# Patient Record
Sex: Female | Born: 1960
Health system: Southern US, Community
[De-identification: ages and names within clinical notes are randomized; demographics above are authoritative.]

## PROBLEM LIST (undated history)

## (undated) DIAGNOSIS — E739 Lactose intolerance, unspecified: Secondary | ICD-10-CM

## (undated) DIAGNOSIS — I10 Essential (primary) hypertension: Secondary | ICD-10-CM

## (undated) DIAGNOSIS — M810 Age-related osteoporosis without current pathological fracture: Secondary | ICD-10-CM

## (undated) DIAGNOSIS — T8859XA Other complications of anesthesia, initial encounter: Secondary | ICD-10-CM

## (undated) DIAGNOSIS — E538 Deficiency of other specified B group vitamins: Secondary | ICD-10-CM

## (undated) DIAGNOSIS — M179 Osteoarthritis of knee, unspecified: Secondary | ICD-10-CM

## (undated) DIAGNOSIS — F32A Depression, unspecified: Secondary | ICD-10-CM

## (undated) DIAGNOSIS — M1990 Unspecified osteoarthritis, unspecified site: Secondary | ICD-10-CM

## (undated) DIAGNOSIS — E559 Vitamin D deficiency, unspecified: Secondary | ICD-10-CM

## (undated) DIAGNOSIS — T4145XA Adverse effect of unspecified anesthetic, initial encounter: Secondary | ICD-10-CM

## (undated) DIAGNOSIS — H269 Unspecified cataract: Secondary | ICD-10-CM

## (undated) DIAGNOSIS — M2550 Pain in unspecified joint: Secondary | ICD-10-CM

## (undated) DIAGNOSIS — H409 Unspecified glaucoma: Secondary | ICD-10-CM

## (undated) DIAGNOSIS — M549 Dorsalgia, unspecified: Secondary | ICD-10-CM

## (undated) DIAGNOSIS — R600 Localized edema: Secondary | ICD-10-CM

## (undated) DIAGNOSIS — E039 Hypothyroidism, unspecified: Secondary | ICD-10-CM

## (undated) DIAGNOSIS — M1710 Unilateral primary osteoarthritis, unspecified knee: Secondary | ICD-10-CM

## (undated) DIAGNOSIS — E119 Type 2 diabetes mellitus without complications: Secondary | ICD-10-CM

## (undated) DIAGNOSIS — F329 Major depressive disorder, single episode, unspecified: Secondary | ICD-10-CM

## (undated) DIAGNOSIS — F419 Anxiety disorder, unspecified: Secondary | ICD-10-CM

## (undated) HISTORY — DX: Deficiency of other specified B group vitamins: E53.8

## (undated) HISTORY — PX: OTHER SURGICAL HISTORY: SHX169

## (undated) HISTORY — DX: Pain in unspecified joint: M25.50

## (undated) HISTORY — DX: Unspecified cataract: H26.9

## (undated) HISTORY — PX: TUBAL LIGATION: SHX77

## (undated) HISTORY — DX: Essential (primary) hypertension: I10

## (undated) HISTORY — DX: Type 2 diabetes mellitus without complications: E11.9

## (undated) HISTORY — DX: Vitamin D deficiency, unspecified: E55.9

## (undated) HISTORY — DX: Unspecified osteoarthritis, unspecified site: M19.90

## (undated) HISTORY — DX: Unilateral primary osteoarthritis, unspecified knee: M17.10

## (undated) HISTORY — DX: Hypothyroidism, unspecified: E03.9

## (undated) HISTORY — DX: Osteoarthritis of knee, unspecified: M17.9

## (undated) HISTORY — DX: Unspecified glaucoma: H40.9

## (undated) HISTORY — DX: Lactose intolerance, unspecified: E73.9

## (undated) HISTORY — PX: ROTATOR CUFF REPAIR: SHX139

## (undated) HISTORY — DX: Localized edema: R60.0

## (undated) HISTORY — DX: Age-related osteoporosis without current pathological fracture: M81.0

## (undated) HISTORY — PX: DILATION AND CURETTAGE OF UTERUS: SHX78

---

## 1997-09-01 ENCOUNTER — Other Ambulatory Visit: Admission: RE | Admit: 1997-09-01 | Discharge: 1997-09-01 | Payer: Self-pay | Admitting: Family Medicine

## 1997-10-05 ENCOUNTER — Other Ambulatory Visit: Admission: RE | Admit: 1997-10-05 | Discharge: 1997-10-05 | Payer: Self-pay | Admitting: Family Medicine

## 1997-10-06 ENCOUNTER — Other Ambulatory Visit: Admission: RE | Admit: 1997-10-06 | Discharge: 1997-10-06 | Payer: Self-pay | Admitting: Family Medicine

## 1998-07-05 ENCOUNTER — Other Ambulatory Visit: Admission: RE | Admit: 1998-07-05 | Discharge: 1998-07-05 | Payer: Self-pay | Admitting: Obstetrics and Gynecology

## 1998-07-30 ENCOUNTER — Ambulatory Visit (HOSPITAL_COMMUNITY): Admission: RE | Admit: 1998-07-30 | Discharge: 1998-07-30 | Payer: Self-pay | Admitting: Obstetrics and Gynecology

## 1998-12-31 ENCOUNTER — Emergency Department (HOSPITAL_COMMUNITY): Admission: EM | Admit: 1998-12-31 | Discharge: 1998-12-31 | Payer: Self-pay

## 1999-01-11 ENCOUNTER — Encounter: Admission: RE | Admit: 1999-01-11 | Discharge: 1999-02-24 | Payer: Self-pay | Admitting: Sports Medicine

## 1999-10-15 ENCOUNTER — Ambulatory Visit (HOSPITAL_COMMUNITY): Admission: RE | Admit: 1999-10-15 | Discharge: 1999-10-15 | Payer: Self-pay | Admitting: Sports Medicine

## 1999-10-15 ENCOUNTER — Encounter: Payer: Self-pay | Admitting: Sports Medicine

## 2000-07-10 ENCOUNTER — Encounter: Admission: RE | Admit: 2000-07-10 | Discharge: 2000-07-10 | Payer: Self-pay | Admitting: Family Medicine

## 2000-07-10 ENCOUNTER — Encounter: Payer: Self-pay | Admitting: Family Medicine

## 2001-12-16 ENCOUNTER — Other Ambulatory Visit: Admission: RE | Admit: 2001-12-16 | Discharge: 2001-12-16 | Payer: Self-pay | Admitting: Family Medicine

## 2002-12-19 ENCOUNTER — Other Ambulatory Visit: Admission: RE | Admit: 2002-12-19 | Discharge: 2002-12-19 | Payer: Self-pay | Admitting: Family Medicine

## 2003-10-19 ENCOUNTER — Other Ambulatory Visit: Admission: RE | Admit: 2003-10-19 | Discharge: 2003-10-19 | Payer: Self-pay | Admitting: Family Medicine

## 2004-05-03 ENCOUNTER — Encounter: Admission: RE | Admit: 2004-05-03 | Discharge: 2004-05-03 | Payer: Self-pay | Admitting: Family Medicine

## 2004-05-10 ENCOUNTER — Ambulatory Visit: Payer: Self-pay | Admitting: Family Medicine

## 2004-06-22 ENCOUNTER — Emergency Department (HOSPITAL_COMMUNITY): Admission: EM | Admit: 2004-06-22 | Discharge: 2004-06-22 | Payer: Self-pay | Admitting: Emergency Medicine

## 2004-06-23 ENCOUNTER — Ambulatory Visit: Payer: Self-pay | Admitting: Family Medicine

## 2004-06-29 ENCOUNTER — Encounter: Admission: RE | Admit: 2004-06-29 | Discharge: 2004-06-29 | Payer: Self-pay | Admitting: Family Medicine

## 2004-10-04 ENCOUNTER — Encounter: Payer: Self-pay | Admitting: Internal Medicine

## 2004-10-04 ENCOUNTER — Encounter (INDEPENDENT_AMBULATORY_CARE_PROVIDER_SITE_OTHER): Payer: Self-pay | Admitting: *Deleted

## 2004-10-05 ENCOUNTER — Ambulatory Visit: Payer: Self-pay | Admitting: Internal Medicine

## 2004-10-05 ENCOUNTER — Other Ambulatory Visit: Admission: RE | Admit: 2004-10-05 | Discharge: 2004-10-05 | Payer: Self-pay | Admitting: Internal Medicine

## 2004-10-11 ENCOUNTER — Encounter: Payer: Self-pay | Admitting: Internal Medicine

## 2005-01-10 ENCOUNTER — Ambulatory Visit: Payer: Self-pay | Admitting: Internal Medicine

## 2005-03-13 ENCOUNTER — Encounter: Payer: Self-pay | Admitting: Internal Medicine

## 2005-03-13 ENCOUNTER — Encounter: Admission: RE | Admit: 2005-03-13 | Discharge: 2005-03-13 | Payer: Self-pay | Admitting: Internal Medicine

## 2005-06-06 ENCOUNTER — Ambulatory Visit: Payer: Self-pay | Admitting: Internal Medicine

## 2005-09-05 ENCOUNTER — Ambulatory Visit: Payer: Self-pay | Admitting: Internal Medicine

## 2005-10-11 ENCOUNTER — Encounter: Payer: Self-pay | Admitting: Internal Medicine

## 2005-10-11 ENCOUNTER — Ambulatory Visit: Payer: Self-pay | Admitting: Internal Medicine

## 2005-10-12 ENCOUNTER — Other Ambulatory Visit: Admission: RE | Admit: 2005-10-12 | Discharge: 2005-10-12 | Payer: Self-pay | Admitting: Internal Medicine

## 2005-12-20 ENCOUNTER — Ambulatory Visit: Payer: Self-pay | Admitting: Internal Medicine

## 2006-01-11 ENCOUNTER — Ambulatory Visit: Payer: Self-pay | Admitting: Internal Medicine

## 2006-01-24 ENCOUNTER — Ambulatory Visit: Payer: Self-pay | Admitting: Internal Medicine

## 2006-04-26 ENCOUNTER — Ambulatory Visit: Payer: Self-pay | Admitting: Internal Medicine

## 2006-04-26 LAB — CONVERTED CEMR LAB
CO2: 29 meq/L (ref 19–32)
Calcium: 9.4 mg/dL (ref 8.4–10.5)
Chloride: 102 meq/L (ref 96–112)
Creatinine, Ser: 0.9 mg/dL (ref 0.4–1.2)
GFR calc non Af Amer: 72 mL/min
Hemoglobin: 12.8 g/dL (ref 12.0–15.0)
MCHC: 34.4 g/dL (ref 30.0–36.0)
Platelets: 302 10*3/uL (ref 150–400)
Potassium: 3.2 meq/L — ABNORMAL LOW (ref 3.5–5.1)
RBC: 4.53 M/uL (ref 3.87–5.11)
Sodium: 137 meq/L (ref 135–145)
Total Bilirubin: 0.6 mg/dL (ref 0.3–1.2)
Total Protein: 6.7 g/dL (ref 6.0–8.3)

## 2006-06-23 DIAGNOSIS — E039 Hypothyroidism, unspecified: Secondary | ICD-10-CM

## 2006-06-23 DIAGNOSIS — M1710 Unilateral primary osteoarthritis, unspecified knee: Secondary | ICD-10-CM

## 2006-06-23 DIAGNOSIS — J302 Other seasonal allergic rhinitis: Secondary | ICD-10-CM

## 2006-07-31 ENCOUNTER — Encounter: Payer: Self-pay | Admitting: Internal Medicine

## 2006-07-31 ENCOUNTER — Ambulatory Visit: Payer: Self-pay | Admitting: Internal Medicine

## 2006-07-31 DIAGNOSIS — I10 Essential (primary) hypertension: Secondary | ICD-10-CM

## 2006-10-24 ENCOUNTER — Emergency Department (HOSPITAL_COMMUNITY): Admission: EM | Admit: 2006-10-24 | Discharge: 2006-10-25 | Payer: Self-pay | Admitting: Emergency Medicine

## 2006-10-25 ENCOUNTER — Telehealth (INDEPENDENT_AMBULATORY_CARE_PROVIDER_SITE_OTHER): Payer: Self-pay | Admitting: *Deleted

## 2006-12-27 ENCOUNTER — Other Ambulatory Visit: Admission: RE | Admit: 2006-12-27 | Discharge: 2006-12-27 | Payer: Self-pay | Admitting: Internal Medicine

## 2006-12-27 ENCOUNTER — Encounter: Payer: Self-pay | Admitting: Internal Medicine

## 2006-12-27 ENCOUNTER — Ambulatory Visit: Payer: Self-pay | Admitting: Internal Medicine

## 2006-12-28 LAB — CONVERTED CEMR LAB
ALT: 21 units/L (ref 0–35)
AST: 26 units/L (ref 0–37)
BUN: 10 mg/dL (ref 6–23)
Creatinine, Ser: 0.8 mg/dL (ref 0.4–1.2)
GFR calc non Af Amer: 83 mL/min
HCT: 36.9 % (ref 36.0–46.0)
HDL: 35.5 mg/dL — ABNORMAL LOW (ref >39.0)
Lymphocytes Relative: 43.6 % (ref 12.0–46.0)
MCV: 82.7 fL (ref 78.0–100.0)
Monocytes Absolute: 0.3 10*3/uL (ref 0.2–0.7)
Neutro Abs: 1.9 10*3/uL (ref 1.4–7.7)
Neutrophils Relative %: 46.7 % (ref 43.0–77.0)
Potassium: 4 meq/L (ref 3.5–5.1)
RBC: 4.47 M/uL (ref 3.87–5.11)
RDW: 12.9 % (ref 11.5–14.6)
Sodium: 137 meq/L (ref 135–145)
TSH: 5.56 microintl units/mL — ABNORMAL HIGH (ref 0.35–5.50)
Total CHOL/HDL Ratio: 4
WBC: 4.1 10*3/uL — ABNORMAL LOW (ref 4.5–10.5)

## 2007-01-07 ENCOUNTER — Encounter (INDEPENDENT_AMBULATORY_CARE_PROVIDER_SITE_OTHER): Payer: Self-pay | Admitting: *Deleted

## 2007-01-30 ENCOUNTER — Telehealth (INDEPENDENT_AMBULATORY_CARE_PROVIDER_SITE_OTHER): Payer: Self-pay | Admitting: *Deleted

## 2007-02-14 ENCOUNTER — Telehealth (INDEPENDENT_AMBULATORY_CARE_PROVIDER_SITE_OTHER): Payer: Self-pay | Admitting: *Deleted

## 2007-03-12 ENCOUNTER — Ambulatory Visit: Payer: Self-pay | Admitting: Internal Medicine

## 2007-04-30 ENCOUNTER — Telehealth: Payer: Self-pay | Admitting: Internal Medicine

## 2007-05-27 ENCOUNTER — Encounter (INDEPENDENT_AMBULATORY_CARE_PROVIDER_SITE_OTHER): Payer: Self-pay | Admitting: *Deleted

## 2007-05-28 ENCOUNTER — Telehealth (INDEPENDENT_AMBULATORY_CARE_PROVIDER_SITE_OTHER): Payer: Self-pay | Admitting: *Deleted

## 2007-05-30 ENCOUNTER — Ambulatory Visit: Payer: Self-pay | Admitting: Internal Medicine

## 2007-06-03 ENCOUNTER — Encounter (INDEPENDENT_AMBULATORY_CARE_PROVIDER_SITE_OTHER): Payer: Self-pay | Admitting: *Deleted

## 2007-06-04 ENCOUNTER — Telehealth (INDEPENDENT_AMBULATORY_CARE_PROVIDER_SITE_OTHER): Payer: Self-pay | Admitting: *Deleted

## 2007-09-19 ENCOUNTER — Telehealth (INDEPENDENT_AMBULATORY_CARE_PROVIDER_SITE_OTHER): Payer: Self-pay | Admitting: *Deleted

## 2008-02-05 ENCOUNTER — Telehealth (INDEPENDENT_AMBULATORY_CARE_PROVIDER_SITE_OTHER): Payer: Self-pay | Admitting: *Deleted

## 2008-02-06 ENCOUNTER — Ambulatory Visit: Payer: Self-pay | Admitting: Internal Medicine

## 2008-02-11 ENCOUNTER — Encounter: Payer: Self-pay | Admitting: Internal Medicine

## 2008-02-11 ENCOUNTER — Ambulatory Visit: Payer: Self-pay | Admitting: Internal Medicine

## 2008-02-11 ENCOUNTER — Other Ambulatory Visit: Admission: RE | Admit: 2008-02-11 | Discharge: 2008-02-11 | Payer: Self-pay | Admitting: Internal Medicine

## 2008-02-17 ENCOUNTER — Encounter (INDEPENDENT_AMBULATORY_CARE_PROVIDER_SITE_OTHER): Payer: Self-pay | Admitting: *Deleted

## 2008-03-03 ENCOUNTER — Ambulatory Visit: Payer: Self-pay | Admitting: Internal Medicine

## 2008-03-03 ENCOUNTER — Encounter: Admission: RE | Admit: 2008-03-03 | Discharge: 2008-03-03 | Payer: Self-pay | Admitting: Internal Medicine

## 2008-03-06 ENCOUNTER — Encounter (INDEPENDENT_AMBULATORY_CARE_PROVIDER_SITE_OTHER): Payer: Self-pay | Admitting: *Deleted

## 2008-03-06 LAB — CONVERTED CEMR LAB
BUN: 14 mg/dL (ref 6–23)
CO2: 30 meq/L (ref 19–32)
Chloride: 102 meq/L (ref 96–112)
Creatinine, Ser: 0.8 mg/dL (ref 0.4–1.2)
Eosinophils Absolute: 0.2 10*3/uL (ref 0.0–0.7)
Eosinophils Relative: 3.8 % (ref 0.0–5.0)
GFR calc Af Amer: 99 mL/min
GFR calc non Af Amer: 82 mL/min
Glucose, Bld: 92 mg/dL (ref 70–99)
HCT: 38.2 % (ref 36.0–46.0)
HDL: 37.1 mg/dL — ABNORMAL LOW (ref >39.0)
Hemoglobin: 13.3 g/dL (ref 12.0–15.0)
MCHC: 34.8 g/dL (ref 30.0–36.0)
MCV: 82.3 fL (ref 78.0–100.0)
Neutrophils Relative %: 44.8 % (ref 43.0–77.0)
Platelets: 233 10*3/uL (ref 150–400)
Potassium: 4 meq/L (ref 3.5–5.1)
RDW: 13.4 % (ref 11.5–14.6)
Sodium: 137 meq/L (ref 135–145)
Triglycerides: 57 mg/dL (ref 0–149)

## 2008-04-28 ENCOUNTER — Telehealth (INDEPENDENT_AMBULATORY_CARE_PROVIDER_SITE_OTHER): Payer: Self-pay | Admitting: *Deleted

## 2008-09-11 ENCOUNTER — Ambulatory Visit: Payer: Self-pay | Admitting: Internal Medicine

## 2008-09-24 ENCOUNTER — Telehealth (INDEPENDENT_AMBULATORY_CARE_PROVIDER_SITE_OTHER): Payer: Self-pay | Admitting: *Deleted

## 2009-01-12 ENCOUNTER — Ambulatory Visit: Payer: Self-pay | Admitting: Internal Medicine

## 2009-01-12 ENCOUNTER — Other Ambulatory Visit: Admission: RE | Admit: 2009-01-12 | Discharge: 2009-01-12 | Payer: Self-pay | Admitting: Internal Medicine

## 2009-01-12 ENCOUNTER — Encounter: Payer: Self-pay | Admitting: Internal Medicine

## 2009-01-12 DIAGNOSIS — N926 Irregular menstruation, unspecified: Secondary | ICD-10-CM | POA: Insufficient documentation

## 2009-01-12 DIAGNOSIS — N939 Abnormal uterine and vaginal bleeding, unspecified: Secondary | ICD-10-CM

## 2009-01-12 LAB — CONVERTED CEMR LAB
Nitrite: NEGATIVE
Protein, U semiquant: NEGATIVE
Specific Gravity, Urine: 1.01
WBC Urine, dipstick: NEGATIVE

## 2009-01-14 ENCOUNTER — Encounter (INDEPENDENT_AMBULATORY_CARE_PROVIDER_SITE_OTHER): Payer: Self-pay | Admitting: *Deleted

## 2009-01-18 ENCOUNTER — Telehealth: Payer: Self-pay | Admitting: Internal Medicine

## 2009-01-18 LAB — CONVERTED CEMR LAB
BUN: 11 mg/dL (ref 6–23)
Basophils Absolute: 0 10*3/uL (ref 0.0–0.1)
Basophils Relative: 0.9 % (ref 0.0–3.0)
CO2: 27 meq/L (ref 19–32)
Calcium: 9.3 mg/dL (ref 8.4–10.5)
Chloride: 105 meq/L (ref 96–112)
Creatinine, Ser: 0.6 mg/dL (ref 0.4–1.2)
Eosinophils Absolute: 0.1 10*3/uL (ref 0.0–0.7)
Eosinophils Relative: 2.6 % (ref 0.0–5.0)
Hemoglobin: 13.3 g/dL (ref 12.0–15.0)
Lymphocytes Relative: 41.4 % (ref 12.0–46.0)
Lymphs Abs: 2.1 10*3/uL (ref 0.7–4.0)
Monocytes Relative: 13.6 % — ABNORMAL HIGH (ref 3.0–12.0)
Neutro Abs: 2.1 10*3/uL (ref 1.4–7.7)
RBC: 4.75 M/uL (ref 3.87–5.11)
RDW: 12.6 % (ref 11.5–14.6)
WBC: 5 10*3/uL (ref 4.5–10.5)

## 2009-01-21 ENCOUNTER — Ambulatory Visit: Payer: Self-pay | Admitting: Internal Medicine

## 2009-03-31 ENCOUNTER — Other Ambulatory Visit: Admission: RE | Admit: 2009-03-31 | Discharge: 2009-03-31 | Payer: Self-pay | Admitting: Obstetrics & Gynecology

## 2009-03-31 ENCOUNTER — Ambulatory Visit: Payer: Self-pay | Admitting: Obstetrics & Gynecology

## 2009-03-31 LAB — CONVERTED CEMR LAB

## 2009-04-06 ENCOUNTER — Telehealth (INDEPENDENT_AMBULATORY_CARE_PROVIDER_SITE_OTHER): Payer: Self-pay | Admitting: *Deleted

## 2009-04-16 ENCOUNTER — Encounter (INDEPENDENT_AMBULATORY_CARE_PROVIDER_SITE_OTHER): Payer: Self-pay | Admitting: *Deleted

## 2009-04-16 ENCOUNTER — Telehealth: Payer: Self-pay | Admitting: Internal Medicine

## 2009-05-04 ENCOUNTER — Telehealth (INDEPENDENT_AMBULATORY_CARE_PROVIDER_SITE_OTHER): Payer: Self-pay | Admitting: *Deleted

## 2009-05-05 ENCOUNTER — Ambulatory Visit: Payer: Self-pay | Admitting: Internal Medicine

## 2009-05-07 ENCOUNTER — Telehealth (INDEPENDENT_AMBULATORY_CARE_PROVIDER_SITE_OTHER): Payer: Self-pay | Admitting: *Deleted

## 2009-05-10 ENCOUNTER — Encounter (INDEPENDENT_AMBULATORY_CARE_PROVIDER_SITE_OTHER): Payer: Self-pay | Admitting: *Deleted

## 2009-05-10 LAB — CONVERTED CEMR LAB: TSH: 1.99 microintl units/mL (ref 0.35–5.50)

## 2009-05-19 ENCOUNTER — Encounter: Payer: Self-pay | Admitting: Internal Medicine

## 2009-05-19 ENCOUNTER — Ambulatory Visit (HOSPITAL_COMMUNITY): Admission: RE | Admit: 2009-05-19 | Discharge: 2009-05-19 | Payer: Self-pay | Admitting: Obstetrics & Gynecology

## 2009-10-26 ENCOUNTER — Ambulatory Visit: Payer: Self-pay | Admitting: Internal Medicine

## 2009-11-02 ENCOUNTER — Telehealth: Payer: Self-pay | Admitting: Internal Medicine

## 2009-11-02 ENCOUNTER — Telehealth (INDEPENDENT_AMBULATORY_CARE_PROVIDER_SITE_OTHER): Payer: Self-pay | Admitting: *Deleted

## 2009-11-20 ENCOUNTER — Ambulatory Visit: Payer: Self-pay | Admitting: Family Medicine

## 2009-12-21 ENCOUNTER — Ambulatory Visit: Payer: Self-pay | Admitting: Internal Medicine

## 2009-12-22 LAB — CONVERTED CEMR LAB
ALT: 34 units/L (ref 0–35)
Albumin: 4.1 g/dL (ref 3.5–5.2)
Bilirubin, Direct: 0.1 mg/dL (ref 0.0–0.3)
Total Bilirubin: 0.6 mg/dL (ref 0.3–1.2)
Total Protein: 7.3 g/dL (ref 6.0–8.3)

## 2009-12-23 ENCOUNTER — Encounter (INDEPENDENT_AMBULATORY_CARE_PROVIDER_SITE_OTHER): Payer: Self-pay | Admitting: *Deleted

## 2010-01-18 ENCOUNTER — Ambulatory Visit: Payer: Self-pay | Admitting: Internal Medicine

## 2010-01-18 ENCOUNTER — Encounter (INDEPENDENT_AMBULATORY_CARE_PROVIDER_SITE_OTHER): Payer: Self-pay | Admitting: *Deleted

## 2010-01-18 ENCOUNTER — Other Ambulatory Visit: Admission: RE | Admit: 2010-01-18 | Discharge: 2010-01-18 | Payer: Self-pay | Admitting: Internal Medicine

## 2010-01-18 LAB — HM PAP SMEAR

## 2010-01-19 ENCOUNTER — Ambulatory Visit: Payer: Self-pay | Admitting: Gastroenterology

## 2010-02-24 ENCOUNTER — Encounter (INDEPENDENT_AMBULATORY_CARE_PROVIDER_SITE_OTHER): Payer: Self-pay | Admitting: *Deleted

## 2010-02-24 ENCOUNTER — Ambulatory Visit: Payer: Self-pay | Admitting: Gastroenterology

## 2010-02-24 ENCOUNTER — Telehealth (INDEPENDENT_AMBULATORY_CARE_PROVIDER_SITE_OTHER): Payer: Self-pay | Admitting: *Deleted

## 2010-03-02 ENCOUNTER — Ambulatory Visit: Payer: Self-pay | Admitting: Gastroenterology

## 2010-03-03 ENCOUNTER — Telehealth: Payer: Self-pay | Admitting: Internal Medicine

## 2010-05-01 DIAGNOSIS — M1990 Unspecified osteoarthritis, unspecified site: Secondary | ICD-10-CM

## 2010-05-01 HISTORY — DX: Unspecified osteoarthritis, unspecified site: M19.90

## 2010-05-20 ENCOUNTER — Ambulatory Visit (HOSPITAL_COMMUNITY): Admission: RE | Admit: 2010-05-20 | Payer: Self-pay | Source: Home / Self Care | Admitting: Internal Medicine

## 2010-05-22 ENCOUNTER — Encounter: Payer: Self-pay | Admitting: Internal Medicine

## 2010-06-01 ENCOUNTER — Encounter: Payer: Self-pay | Admitting: Internal Medicine

## 2010-06-02 NOTE — Miscellaneous (Signed)
Summary: colonoscopy/ks  Clinical Lists Changes  Observations: Added new observation of ALLERGY REV: Done (02/24/2010 16:21)

## 2010-06-02 NOTE — Progress Notes (Signed)
Summary: THYROID TEST RESULTS BACK??-- PT OUT OF MEDS  Phone Note Call from Patient Call back at [facility] Phone [phone]   Caller: Patient Summary of Call: PATEINT CALLED TO SEE IF HER THYROID TEST RESULTS ARE BACK----PLEASE READ AND SEND PRESCRIPTION TO [facility] ON [address]  SHE HAS BEEN OUT FOR THREE DAYS---PLEASE CALL HER TO VERIFY THAT PRESCRIPTION HAS BEEN CALLED IN Initial call taken by: Jerolyn Shin,  May 07, 2009 1:03 PM  Follow-up for Phone Call        rx faxed to [facility] pt has been informed .Kandice Hams  May 07, 2009 1:55 PM  Follow-up by: Kandice Hams,  May 07, 2009 1:55 PM    Prescriptions: SYNTHROID 175 MCG TABS (LEVOTHYROXINE SODIUM) 1 daily  #35 Each x 5   Entered by:   Kandice Hams   Authorized by:   Nolon Rod. Paz MD   Signed by:   Kandice Hams on 05/07/2009   Method used:   Faxed to ...       [facility]       [address]       [place], [place]  [address]       Ph: [phone]       Fax: [phone]   RxID:   [id number]

## 2010-06-02 NOTE — Progress Notes (Signed)
Summary: refill  Phone Note Refill Request Call back at Home Phone [phone]   Refills Requested: Medication #1:  MAXZIDE-25 37.5-25 MG TABS 1/2 tab by mouth once  a day patient grandson spill pills out - needs new rx [facility]   Initial call taken by: Okey Regal Spring,  November 02, 2009 1:51 PM    Prescriptions: MAXZIDE-25 37.5-25 MG TABS (TRIAMTERENE-HCTZ) 1/2 tab by mouth once  a day  #30 Each x 2   Entered by:   Army Fossa CMA   Authorized by:   Nolon Rod. Paz MD   Signed by:   Army Fossa CMA on 11/02/2009   Method used:   Electronically to        [facility] (retail)       [address]       [place], [place]  [address]       Ph: [phone]       Fax: [phone]   RxID:   [id number]

## 2010-06-02 NOTE — Procedures (Signed)
Summary: Colonoscopy  Patient: Kathleen Mcfarland Note: All result statuses are Final unless otherwise noted.  Tests: (1) Colonoscopy (COL)   COL Colonoscopy           DONE     [facility]     [address]     [place], [place]  [address]           COLONOSCOPY PROCEDURE REPORT           PATIENT:  Kathleen Mcfarland, Kathleen Mcfarland  MR#:  [id number]     BIRTHDATE:  1960/08/01, 49 yrs. old  GENDER:  female     ENDOSCOPIST:  Rachael Fee, MD     REF. BY:  Willow Ora, M.D.     PROCEDURE DATE:  03/02/2010     PROCEDURE:  Diagnostic Colonoscopy     ASA CLASS:  Class II     INDICATIONS:  Elevated Risk Screening, two second degree relatives     with colon cancer     MEDICATIONS:   Fentanyl 75 mcg IV, Versed 8 mg IV           DESCRIPTION OF PROCEDURE:   After the risks benefits and     alternatives of the procedure were thoroughly explained, informed     consent was obtained.  Digital rectal exam was performed and     revealed no rectal masses.   The LB [id number]-[id number] endoscope     was introduced through the anus and advanced to the cecum, which     was identified by both the appendix and ileocecal valve, without     limitations.  The quality of the prep was good, using MoviPrep.     The instrument was then slowly withdrawn as the colon was fully     examined.     <<PROCEDUREIMAGES>>     FINDINGS:  Mild diverticulosis was found in the sigmoid to     descending colon segments (see image1).  This was otherwise a     normal examination of the colon (see image2, image4, and image5).     Retroflexed views in the rectum revealed no abnormalities.    The     scope was then withdrawn from the patient and the procedure     completed.     COMPLICATIONS:  None           ENDOSCOPIC IMPRESSION:     1) Mild diverticulosis in the sigmoid to descending colon     segments     2) Otherwise normal examination; no polyps or cancers           RECOMMENDATIONS:     1) Given your family history of colon  cancer, you should have a     repeat colonoscopy in 5 years           REPEAT EXAM:  5 years           ______________________________     Rachael Fee, MD           n.     eSIGNED:   Rachael Fee at 03/02/2010 10:44 AM           [employer], [id number]  Note: An exclamation mark (!) indicates a result that was not dispersed into the flowsheet. Document Creation Date: 03/02/2010 10:45 AM _______________________________________________________________________  (1) Order result status: Final Collection or observation date-time: 03/02/2010 10:41 Requested date-time:  Receipt date-time:  Reported date-time:  Referring Physician:   Ordering Physician: Rob Bunting (  [id number]) Specimen Source:  Source: Launa Grill Order Number: [id number] Lab site:   Appended Document: Colonoscopy    Clinical Lists Changes  Observations: Added new observation of COLONNXTDUE: 03/2015 (03/02/2010 11:53)

## 2010-06-02 NOTE — Letter (Signed)
Summary: Results Follow up Letter  [facility] [place]  [address] [place], [place] [address]   Phone: [phone]  Fax: [phone]    05/10/2009 MRN: [id number]  [facility] [address] [place], [place]  [address]  Dear Ms. Kaestner,   Your recent thyroid test was normal.  Please continue on your current dose of synthroid.  Please call our office to schedule a lab only visit in 3 months to recheck your thyroid.  Feel free to call me if you have any questions or concerns.   Alena Bills [phone] ext [phone]

## 2010-06-02 NOTE — Progress Notes (Signed)
Summary: need lab appt  Phone Note Outgoing Call   Call placed by: Doristine Devoid,  May 04, 2009 9:01 AM Call placed to: Patient Summary of Call: left message on machine informing patient needs to schedule appt   -due for recheck on TSH from 01/12/09 labs-244.9  need to check labs before refilling meds   Follow-up for Phone Call        spoke with pt -- scheduled lab visit for tomorrow Kathleen Mcfarland  May 04, 2009 2:16 PM

## 2010-06-02 NOTE — Progress Notes (Signed)
Summary: med not available  Phone Note Call from Patient Call back at [phone]   Caller: Patient Summary of Call: [facility] - patient said med not available from [facility] - does dr Drue Novel want to change med - [facility] [address] Initial call taken by: Okey Regal Spring,  November 02, 2009 4:30 PM  Follow-up for Phone Call        change to hydrochlorothiazide 25 mg one p.o. q. day please arrange for office visit in one month Follow-up by: Zaylin Pistilli E. Dealie Koelzer MD,  November 03, 2009 2:53 PM  Additional Follow-up for Phone Call Additional follow up Details #1::        Spoke with pt, called in meds and she has an appt for 1 month. Army Fossa CMA  November 03, 2009 2:58 PM     New/Updated Medications: HYDROCHLOROTHIAZIDE 25 MG TABS (HYDROCHLOROTHIAZIDE) 1 by mouth once daily. Prescriptions: HYDROCHLOROTHIAZIDE 25 MG TABS (HYDROCHLOROTHIAZIDE) 1 by mouth once daily.  #30 x 0   Entered by:   Army Fossa CMA   Authorized by:   Nolon Rod. Danity Schmelzer MD   Signed by:   Army Fossa CMA on 11/03/2009   Method used:   Electronically to        [facility] (retail)       [address]       [place], [place]  [address]       Ph: [phone]       Fax: [phone]   RxID:   [id number]

## 2010-06-02 NOTE — Assessment & Plan Note (Signed)
Summary: discuss colonoscopy with family hx//lch   Vital Signs:  Patient profile:   50 year old female Menstrual status:  irregular Weight:      238 pounds Pulse rate:   85 / minute Pulse rhythm:   regular BP sitting:   126 / 80  (left arm) Cuff size:   large  Vitals Entered By: Army Fossa CMA (December 21, 2009 11:27 AM) CC: Referral for colonoscopy due to family history CBG Result 99 Comments - TSH level checked BS tested for DM   History of Present Illness: several issues She likes a referral for colonoscopy, her FH  is updated. See below Also like a screening for diabetes and liver problems . she just lost her mom to liver failure (per pt)  Current Medications (verified): 1)  Synthroid 175 Mcg Tabs (Levothyroxine Sodium) .Marland Kitchen.. 1 Daily 2)  Hydrochlorothiazide 25 Mg Tabs (Hydrochlorothiazide) .Marland Kitchen.. 1 By Mouth Once Daily. 3)  Healthy Shakes  Allergies: 1)  ! * Felodipine  Past History:  Past Medical History: Reviewed history from 01/12/2009 and no changes required. Allergic rhinitis Hypothyroidism HYPERTENSION OSTEOARTHRITIS, KNEE   G2P2  Past Surgical History: Reviewed history from 01/12/2009 and no changes required. Tubal ligation Rotator cuff repair (rt shoulder)  Family History: breast cancer--no CAD--father has heart disease diagnosed at age 40  colon cancer-- great uncle dx at age 13 great aunt dx at age 66  GM age of dx in her 50  colon polyps: 2 sisters dx in her 1 and 35 y/o  DM  (+) , F M HTN (+) F M stroke - no hepatitis C--M   Social History: Reviewed history from 01/12/2009 and no changes required. Single two children Occupation: Curator Former Smoker (was not a "heavy smoker") Alcohol use-no Drug use-no Regular exercise-yes (2x/wk) caffeine - no  Review of Systems GI:  Denies bloody stools, diarrhea, nausea, and vomiting.  Physical Exam  General:  alert, well-developed, and overweight-appearing.   Abdomen:  soft,  non-tender, no distention, no masses, no guarding, and no rigidity.     Impression & Recommendations:  Problem # 1:  HYPOTHYROIDISM (ICD-244.9) due for labs Her updated medication list for this problem includes:    Synthroid 175 Mcg Tabs (Levothyroxine sodium) .Marland Kitchen... 1 daily  Orders: Venipuncture ([address]) TLB-TSH (Thyroid Stimulating Hormone) (84443-TSH)  Labs Reviewed: TSH: 1.99 (05/05/2009)    Chol: 146 (03/03/2008)   HDL: 37.1 (03/03/2008)   LDL: 98 (03/03/2008)   TG: 57 (03/03/2008)  Problem # 2:  HYPERTENSION, BENIGN ESSENTIAL (ICD-401.1) at goal  Her updated medication list for this problem includes:    Hydrochlorothiazide 25 Mg Tabs (Hydrochlorothiazide) .Marland Kitchen... 1 by mouth once daily.  Orders: TLB-Hepatic/Liver Function Pnl (80076-HEPATIC)  BP today: 126/80 Prior BP: 128/80 (11/20/2009)  Labs Reviewed: K+: 4.2 (01/12/2009) Creat: : 0.6 (01/12/2009)   Chol: 146 (03/03/2008)   HDL: 37.1 (03/03/2008)   LDL: 98 (03/03/2008)   TG: 57 (03/03/2008)  Problem # 3:  HEALTH MAINTENANCE EXAM (ICD-V70.0)  family history updated, will refer to GI for colonoscopy screening for diabetes negative  Orders: Gastroenterology Referral (GI)  Complete Medication List: 1)  Synthroid 175 Mcg Tabs (Levothyroxine sodium) .Marland Kitchen.. 1 daily 2)  Hydrochlorothiazide 25 Mg Tabs (Hydrochlorothiazide) .Marland Kitchen.. 1 by mouth once daily. 3)  Healthy Shakes   Patient Instructions: 1)  Please schedule a follow-up appointment in 1 or 2 month, fasting, physical exam

## 2010-06-02 NOTE — Progress Notes (Signed)
Summary: prep ?'s  Phone Note Call from Patient Call back at [facility] Phone [phone]   Caller: Patient Call For: Dr. Christella Hartigan Reason for Call: Talk to Nurse Summary of Call: prep ?'s Initial call taken by: Vallarie Mare,  February 24, 2010 11:25 AM  Follow-up for Phone Call        pt rescheduled colonoscopy from 01/19/2010 to 03/02/2010.  Lost prep instruction from 9/21.  PV scheduled for today at 4:30. Follow-up by: Ezra Sites RN,  February 24, 2010 11:37 AM

## 2010-06-02 NOTE — Letter (Signed)
Summary: [facility] Instructions  [facility]  [address] [place], [place] [address]   Phone: [phone]  Fax: [phone]       Kathleen Mcfarland    11/29/1960    MRN: [id number]        Procedure Day Dorna Bloom:  Wednesday 03/02/2010     Arrival Time: 9:30 am      Procedure Time: 10:30 am     Location of Procedure:                    _x _  [facility] (4th Floor)                        PREPARATION FOR COLONOSCOPY WITH MOVIPREP   Starting 5 days prior to your procedure Friday 10/28 do not eat nuts, seeds, popcorn, corn, beans, peas,  salads, or any raw vegetables.  Do not take any fiber supplements (e.g. Metamucil, Citrucel, and Benefiber).  THE DAY BEFORE YOUR PROCEDURE         DATE: Tuesday 11/1  1.  Drink clear liquids the entire day-NO SOLID FOOD  2.  Do not drink anything colored red or purple.  Avoid juices with pulp.  No orange juice.  3.  Drink at least 64 oz. (8 glasses) of fluid/clear liquids during the day to prevent dehydration and help the prep work efficiently.  CLEAR LIQUIDS INCLUDE: Water Jello Ice Popsicles Tea (sugar ok, no milk/cream) Powdered fruit flavored drinks Coffee (sugar ok, no milk/cream) Gatorade Juice: apple, white grape, white cranberry  Lemonade Clear bullion, consomm, broth Carbonated beverages (any kind) Strained chicken noodle soup Hard Candy                             4.  In the morning, mix first dose of MoviPrep solution:    Empty 1 Pouch A and 1 Pouch B into the disposable container    Add lukewarm drinking water to the top line of the container. Mix to dissolve    Refrigerate (mixed solution should be used within 24 hrs)  5.  Begin drinking the prep at 5:00 p.m. The MoviPrep container is divided by 4 marks.   Every 15 minutes drink the solution down to the next mark (approximately 8 oz) until the full liter is complete.   6.  Follow completed prep with 16 oz of clear liquid of your choice  (Nothing red or purple).  Continue to drink clear liquids until bedtime.  7.  Before going to bed, mix second dose of MoviPrep solution:    Empty 1 Pouch A and 1 Pouch B into the disposable container    Add lukewarm drinking water to the top line of the container. Mix to dissolve    Refrigerate  THE DAY OF YOUR PROCEDURE      DATE: Wednesday 11/2  Beginning at 5:30 a.m. (5 hours before procedure):         1. Every 15 minutes, drink the solution down to the next mark (approx 8 oz) until the full liter is complete.  2. Follow completed prep with 16 oz. of clear liquid of your choice.    3. You may drink clear liquids until 8:30 am (2 HOURS BEFORE PROCEDURE).   MEDICATION INSTRUCTIONS  Unless otherwise instructed, you should take regular prescription medications with a small sip of water   as early as possible the morning of  your procedure.  Hold HCTZ am of procedure.         OTHER INSTRUCTIONS  You will need a responsible adult at least 49 years of age to accompany you and drive you home.   This person must remain in the waiting room during your procedure.  Wear loose fitting clothing that is easily removed.  Leave jewelry and other valuables at home.  However, you may wish to bring a book to read or  an iPod/MP3 player to listen to music as you wait for your procedure to start.  Remove all body piercing jewelry and leave at home.  Total time from sign-in until discharge is approximately 2-3 hours.  You should go home directly after your procedure and rest.  You can resume normal activities the  day after your procedure.  The day of your procedure you should not:   Drive   Make legal decisions   Operate machinery   Drink alcohol   Return to work  You will receive specific instructions about eating, activities and medications before you leave.    The above instructions have been reviewed and explained to me by   Clide Cliff,  RN______________________    I fully understand and can verbalize these instructions _____________________________ Date _________

## 2010-06-02 NOTE — Assessment & Plan Note (Signed)
Summary: tampon stuck   Vital Signs:  Patient profile:   50 year old female Menstrual status:  irregular Height:      69 inches Weight:      243 pounds BMI:     36.01 O2 Sat:      98 % on Room air Temp:     97.9 degrees F oral Pulse rate:   82 / minute BP sitting:   128 / 80  (left arm) Cuff size:   large  Vitals Entered By: Margaret Pyle, CMA (50 November 20, 2009 10:12 AM)  O2 Flow:  Room air CC: Pt can not remove tampon x 2 days/ DBD   Primary Care Provider:  Nolon Rod. Paz MD  CC:  Pt can not remove tampon x 2 days/ DBD.  History of Present Illness: 50 yo AAF presents for a tampon that got stuck in yesterday.  Foul odor.She has no pain.  Using a pad now.  O/W feels fine.  Has heavy periods.  Current Medications (verified): 1)  Synthroid 175 Mcg Tabs (Levothyroxine Sodium) .Marland Kitchen.. 1 Daily 2)  Mvi 3)  Hydrochlorothiazide 25 Mg Tabs (Hydrochlorothiazide) .Marland Kitchen.. 1 By Mouth Once Daily. 4)  Singulair 10 Mg Tabs (Montelukast Sodium) .Marland Kitchen.. 1 By Mouth Qd 5)  Zyrtec Allergy 10 Mg Tabs (Cetirizine Hcl) .Marland Kitchen.. 1 A Day Otc 6)  Nasonex 50 Mcg/act Susp (Mometasone Furoate) .... 2 Sprays On Each Side of The Nose Once A Day 7)  Healthy Shakes 8)  Cell U Loss  Allergies (verified): 1)  ! * Felodipine  Review of Systems      See HPI  Physical Exam  General:  alert, well-developed, well-nourished, and well-hydrated.   Genitalia:  speculum exam performed.  retained foul - odored tampon is on the left side of the vagina with string retracted.  Using hemostats, tampon was fully extracted w/o complication.     Impression & Recommendations:  Problem # 1:  FOREIGN BODY IN VULVA AND VAGINA (ICD-939.2) Tampon removed after  ~28 hrs left in the vagina. Removed w/o complication.  foul odor should improve today. Do not use tampons the rest of this cycle. She will talk to Dr Jearld Lesch about options for heavy menses.  Complete Medication List: 1)  Synthroid 175 Mcg Tabs (Levothyroxine sodium) .Marland Kitchen..  1 daily 2)  Mvi  3)  Hydrochlorothiazide 25 Mg Tabs (Hydrochlorothiazide) .Marland Kitchen.. 1 by mouth once daily. 4)  Singulair 10 Mg Tabs (Montelukast sodium) .Marland Kitchen.. 1 by mouth qd 5)  Zyrtec Allergy 10 Mg Tabs (Cetirizine hcl) .Marland Kitchen.. 1 a day otc 6)  Nasonex 50 Mcg/act Susp (Mometasone furoate) .... 2 sprays on each side of the nose once a day 7)  Healthy Shakes  8)  Cell U Loss   Patient Instructions: 1)  do no use tampons for the remainder of this menstrual cycle. 2)  The foul odor should improve today. 3)  Call the gyn office at [facility] to talk to Dr Penne Lash about doing an endometrial ablation for heavy periods.  [phone]

## 2010-06-02 NOTE — Letter (Signed)
Summary: [facility] Instructions  [facility]  [address] [place], [place] [address]   Phone: [phone]  Fax: [phone]       Kathleen Mcfarland    1960/05/30    MRN: [id number]        Procedure Day Dorna Bloom: [facility] 02/02/10     Arrival Time: 12:30pm     Procedure Time: 1:30pm     Location of Procedure:                    _X_  [facility] (4th Floor)                       PREPARATION FOR COLONOSCOPY WITH MOVIPREP   Starting 5 days prior to your procedure FRIDAY 09/30 do not eat nuts, seeds, popcorn, corn, beans, peas,  salads, or any raw vegetables.  Do not take any fiber supplements (e.g. Metamucil, Citrucel, and Benefiber).  THE DAY BEFORE YOUR PROCEDURE         DATE: TUESDAY 02/01/10  1.  Drink clear liquids the entire day-NO SOLID FOOD  2.  Do not drink anything colored red or purple.  Avoid juices with pulp.  No orange juice.  3.  Drink at least 64 oz. (8 glasses) of fluid/clear liquids during the day to prevent dehydration and help the prep work efficiently.  CLEAR LIQUIDS INCLUDE: Water Jello Ice Popsicles Tea (sugar ok, no milk/cream) Powdered fruit flavored drinks Coffee (sugar ok, no milk/cream) Gatorade Juice: apple, white grape, white cranberry  Lemonade Clear bullion, consomm, broth Carbonated beverages (any kind) Strained chicken noodle soup Hard Candy                             4.  In the morning, mix first dose of MoviPrep solution:    Empty 1 Pouch A and 1 Pouch B into the disposable container    Add lukewarm drinking water to the top line of the container. Mix to dissolve    Refrigerate (mixed solution should be used within 24 hrs)  5.  Begin drinking the prep at 5:00 p.m. The MoviPrep container is divided by 4 marks.   Every 15 minutes drink the solution down to the next mark (approximately 8 oz) until the full liter is complete.   6.  Follow completed prep with 16 oz of clear liquid of your choice (Nothing  red or purple).  Continue to drink clear liquids until bedtime.  7.  Before going to bed, mix second dose of MoviPrep solution:    Empty 1 Pouch A and 1 Pouch B into the disposable container    Add lukewarm drinking water to the top line of the container. Mix to dissolve    Refrigerate  THE DAY OF YOUR PROCEDURE      DATE:  [facility]  02/02/10  Beginning at  8:30a.m. (5 hours before procedure):         1. Every 15 minutes, drink the solution down to the next mark (approx 8 oz) until the full liter is complete.  2. Follow completed prep with 16 oz. of clear liquid of your choice.    3. You may drink clear liquids until 11:30am  (2 HOURS BEFORE PROCEDURE).   MEDICATION INSTRUCTIONS  Unless otherwise instructed, you should take regular prescription medications with a small sip of water   as early as possible the morning of your procedure.  Additional medication instructions: Hold Triam/HCTZ the morning of procedure.         OTHER INSTRUCTIONS  You will need a responsible adult at least 49 years of age to accompany you and drive you home.   This person must remain in the waiting room during your procedure.  Wear loose fitting clothing that is easily removed.  Leave jewelry and other valuables at home.  However, you may wish to bring a book to read or  an iPod/MP3 player to listen to music as you wait for your procedure to start.  Remove all body piercing jewelry and leave at home.  Total time from sign-in until discharge is approximately 2-3 hours.  You should go home directly after your procedure and rest.  You can resume normal activities the  day after your procedure.  The day of your procedure you should not:   Drive   Make legal decisions   Operate machinery   Drink alcohol   Return to work  You will receive specific instructions about eating, activities and medications before you leave.    The above instructions have been reviewed and explained to  me by  Wyona Almas RN  January 19, 2010 2:40 PM     I fully understand and can verbalize these instructions _____________________________ Date _________

## 2010-06-02 NOTE — Progress Notes (Signed)
Summary: Cramps after colonoscopy.  Phone Note Call from Patient   Caller: Patient Summary of Call: Call approx 1130 PM yesterday Cramps in lower abdomen without radiation had tolerated food, + flatus, no nause/vomiting, no fever and no back pain Advised liquid diet, possible heating pad to abdomen and acetaminophen vs. ibuprofen. Call back sooner or to ED if any of above negatives develop.  Initial call taken by: Iva Boop MD, Clementeen Graham,  March 03, 2010 8:29 AM     Appended Document: Cramps after colonoscopy. patty, can you call to check on how she is doing this AM  Appended Document: Cramps after colonoscopy. pt states she is feeling fine today no problems.

## 2010-06-02 NOTE — Letter (Signed)
Summary: Previsit letter  [facility]  [address] [place], [place] [address]   Phone: [phone]  Fax: [phone]       12/23/2009 MRN: [id number]  [facility] [address] [place], [place]  [address]  Dear Ms. Feltz,  Welcome to the Gastroenterology Division at [facility].    You are scheduled to see a nurse for your pre-procedure visit on 01-19-2010 at 2:00pm on the 3rd floor at [facility], [address]  We ask that you try to arrive at our office 15 minutes prior to your appointment time to allow for check-in.  Your nurse visit will consist of discussing your medical and surgical history, your immediate family medical history, and your medications.    Please bring a complete list of all your medications or, if you prefer, bring the medication bottles and we will list them.  We will need to be aware of both prescribed and over the counter drugs.  We will need to know exact dosage information as well.  If you are on blood thinners (Coumadin, Plavix, Aggrenox, Ticlid, etc.) please call our office today/prior to your appointment, as we need to consult with your physician about holding your medication.   Please be prepared to read and sign documents such as consent forms, a financial agreement, and acknowledgement forms.  If necessary, and with your consent, a friend or relative is welcome to sit-in on the nurse visit with you.  Please bring your insurance card so that we may make a copy of it.  If your insurance requires a referral to see a specialist, please bring your referral form from your primary care physician.  No co-pay is required for this nurse visit.     If you cannot keep your appointment, please call [phone] to cancel or reschedule prior to your appointment date.  This allows [place] the opportunity to schedule an appointment for another patient in need of care.    Thank you for choosing [facility] for your medical  needs.  We appreciate the opportunity to care for you.  Please visit [place] at our website  to learn more about our practice.                     Sincerely.                                                                                                                   The Gastroenterology Division

## 2010-06-02 NOTE — Assessment & Plan Note (Signed)
Summary: cpx/kn   Vital Signs:  Patient profile:   50 year old female Menstrual status:  irregular Height:      69 inches Weight:      239.13 pounds Pulse rate:   76 / minute Pulse rhythm:   regular BP sitting:   126 / 82  (left arm) Cuff size:   large  Vitals Entered By: Army Fossa CMA (January 18, 2010 12:54 PM) CC: CPX, PAP, not fasting  Comments pharm- [facility] declines flu shot   History of Present Illness: CPX  feels well   Preventive Screening-Counseling & Management  Caffeine-Diet-Exercise     Times/week: 3  Current Medications (verified): 1)  Synthroid 175 Mcg Tabs (Levothyroxine Sodium) .Marland Kitchen.. 1 Daily 2)  Healthy Shakes 3)  Triamterene-Hctz 37.5-25 Mg Tabs (Triamterene-Hctz) .... 1/2 By Mouth Daily  Allergies: 1)  ! * Felodipine  Past History:  Past Medical History: Reviewed history from 01/12/2009 and no changes required. Allergic rhinitis Hypothyroidism HYPERTENSION OSTEOARTHRITIS, KNEE   G2P2  Past Surgical History: Reviewed history from 01/12/2009 and no changes required. Tubal ligation Rotator cuff repair (rt shoulder) PMH reviewed for relevance  Family History: Reviewed history from 12/21/2009 and no changes required. breast cancer--no CAD--father has heart disease diagnosed at age 74  colon cancer-- great uncle dx at age 40 great aunt dx at age 71  GM age of dx in her 74  colon polyps: 2 sisters dx in her 60 and 44 y/o  DM  (+) , F M HTN (+) F M stroke - no hepatitis C--M   Social History: Single two children Occupation: Curator Former Smoker (was not a "heavy smoker") Alcohol use-no Drug use-no Regular exercise-yes (3/ week) caffeine - no  Review of Systems CV:  Denies chest pain or discomfort and swelling of feet. Resp:  Denies cough and wheezing. GI:  Denies bloody stools, diarrhea, nausea, and vomiting. GU:  Denies discharge, dysuria, and hematuria; periods are normal  practices safe sex, has a  singlesexual partner, uses condoms .  Physical Exam  General:  alert, well-developed, and overweight-appearing.   Breasts:  No mass, nodules, thickening, tenderness, bulging, retraction, inflamation, nipple discharge or skin changes noted.  no axillary lymphadenopathy   Lungs:  normal respiratory effort, no intercostal retractions, no accessory muscle use, and somehow decreased BS  Heart:  normal rate, regular rhythm, no murmur, and no gallop.   Abdomen:  non-tender, no distention, no masses, no guarding, and no rigidity.   Genitalia:  normal introitus, no external lesions, no vaginal discharge, mucosa pink and moist, no vaginal or cervical lesions, no vaginal atrophy, no friaility or hemorrhage, and no adnexal masses or tenderness.   Extremities:  no pretibial edema bilaterally  Psych:  Cognition and judgment appear intact. Alert and cooperative with normal attention span and concentration , not anxious appearing and not depressed appearing.     Impression & Recommendations:  Problem # 1:  HEALTH MAINTENANCE EXAM (ICD-V70.0)  Td 2008 explained benefits of flu shot, patient agreed  last Pap smear 9-10, negative, had trichomonas  at the time had irregular periods, reportedly , saw Gyn in 2010, had a MMG periods are now normal We'll send a Pap smear, G&C ordering another mammogram  Colonoscopy pending discussed diet and exercise  Orders: Radiology Referral (Radiology)  Problem # 2:  HYPERTENSION, BENIGN ESSENTIAL (ICD-401.1) at goal  The following medications were removed from the medication list:    Hydrochlorothiazide 25 Mg Tabs (Hydrochlorothiazide) .Marland Kitchen... 1 by mouth once  daily. Her updated medication list for this problem includes:    Triamterene-hctz 37.5-25 Mg Tabs (Triamterene-hctz) .Marland Kitchen... 1/2 by mouth daily  BP today: 126/82 Prior BP: 126/80 (12/21/2009)  Labs Reviewed: K+: 4.2 (01/12/2009) Creat: : 0.6 (01/12/2009)   Chol: 146 (03/03/2008)   HDL: 37.1 (03/03/2008)    LDL: 98 (03/03/2008)   TG: 57 (03/03/2008)  Complete Medication List: 1)  Synthroid 175 Mcg Tabs (Levothyroxine sodium) .Marland Kitchen.. 1 daily 2)  Healthy Shakes  3)  Triamterene-hctz 37.5-25 Mg Tabs (Triamterene-hctz) .... 1/2 by mouth daily  Other Orders: Admin 1st Vaccine (16109) Flu Vaccine 36yrs + [phone])  Patient Instructions: 1)  come back fasting 2)  FLP, CBC, AST, ALT, BMP---- dx v70 3)  Please schedule a follow-up appointment in 6 months .    Risk Factors:  Exercise:  yes    Times per week:  3  Mammogram History:     Date of Last Mammogram:  08/29/2009    Results:  normal- per pt     Preventive Care Screening  Mammogram:    Date:  08/29/2009    Results:  normal- per pt  Flu Vaccine Consent Questions     Do you have a history of severe allergic reactions to this vaccine? no    Any prior history of allergic reactions to egg and/or gelatin? no    Do you have a sensitivity to the preservative Thimersol? no    Do you have a past history of Guillan-Barre Syndrome? no    Do you currently have an acute febrile illness? no    Have you ever had a severe reaction to latex? no    Vaccine information given and explained to patient? yes    Are you currently pregnant? no    Lot Number:[id number]   Exp Date:10/29/2010   Site Given  Left Deltoid IM    Date:  08/29/2009    Results:  normal- per pt     .lbflu

## 2010-06-02 NOTE — Miscellaneous (Signed)
Summary: [facility] Previsit/prep  Clinical Lists Changes  Medications: Added new medication of MOVIPREP 100 GM  SOLR (PEG-KCL-NACL-NASULF-NA ASC-C) As per prep instructions. - Signed Rx of MOVIPREP 100 GM  SOLR (PEG-KCL-NACL-NASULF-NA ASC-C) As per prep instructions.;  #1 x 0;  Signed;  Entered by: Wyona Almas RN;  Authorized by: Rachael Fee MD;  Method used: Electronically to [facility] [address] [place], [place], [place]  [address], Ph: [phone], Fax: [phone] Observations: Added new observation of ALLERGY REV: Done (01/19/2010 13:52)    Prescriptions: MOVIPREP 100 GM  SOLR (PEG-KCL-NACL-NASULF-NA ASC-C) As per prep instructions.  #1 x 0   Entered by:   Wyona Almas RN   Authorized by:   Rachael Fee MD   Signed by:   Wyona Almas RN on 01/19/2010   Method used:   Electronically to        [facility] (retail)       [address]       [place], [place]  [address]       Ph: [phone]       Fax: [phone]   RxID:   [id number]

## 2010-06-07 ENCOUNTER — Other Ambulatory Visit: Payer: Self-pay | Admitting: Internal Medicine

## 2010-06-07 ENCOUNTER — Telehealth: Payer: Self-pay | Admitting: Internal Medicine

## 2010-06-07 DIAGNOSIS — Z1231 Encounter for screening mammogram for malignant neoplasm of breast: Secondary | ICD-10-CM

## 2010-06-16 ENCOUNTER — Other Ambulatory Visit: Payer: Self-pay | Admitting: Internal Medicine

## 2010-06-16 ENCOUNTER — Other Ambulatory Visit (INDEPENDENT_AMBULATORY_CARE_PROVIDER_SITE_OTHER): Payer: BC Managed Care – PPO

## 2010-06-16 ENCOUNTER — Encounter (INDEPENDENT_AMBULATORY_CARE_PROVIDER_SITE_OTHER): Payer: Self-pay | Admitting: *Deleted

## 2010-06-16 ENCOUNTER — Ambulatory Visit (HOSPITAL_COMMUNITY): Payer: BC Managed Care – PPO

## 2010-06-16 DIAGNOSIS — Z0000 Encounter for general adult medical examination without abnormal findings: Secondary | ICD-10-CM

## 2010-06-16 LAB — CBC WITH DIFFERENTIAL/PLATELET
Eosinophils Absolute: 0.1 10*3/uL (ref 0.0–0.7)
HCT: 39.3 % (ref 36.0–46.0)
MCHC: 34.1 g/dL (ref 30.0–36.0)
MCV: 83.2 fl (ref 78.0–100.0)
Platelets: 255 10*3/uL (ref 150.0–400.0)
RBC: 4.73 Mil/uL (ref 3.87–5.11)
RDW: 13.6 % (ref 11.5–14.6)
WBC: 5.1 10*3/uL (ref 4.5–10.5)

## 2010-06-16 LAB — BASIC METABOLIC PANEL
CO2: 29 mEq/L (ref 19–32)
Chloride: 107 mEq/L (ref 96–112)
Creatinine, Ser: 0.8 mg/dL (ref 0.4–1.2)
Glucose, Bld: 98 mg/dL (ref 70–99)
Potassium: 4.9 mEq/L (ref 3.5–5.1)

## 2010-06-16 LAB — LIPID PANEL
HDL: 37 mg/dL — ABNORMAL LOW (ref >39.00)
LDL Cholesterol: 89 mg/dL (ref 0–99)

## 2010-06-16 LAB — ALT: ALT: 24 U/L (ref 0–35)

## 2010-06-16 NOTE — Progress Notes (Signed)
Summary: Additional testing requested  Phone Note Call from Patient Call back at [facility] Phone [phone]   Caller: Patient Complaint: Urinary/GYN Problems Summary of Call: In Sept patient never came in for these labs:             FLP, CBC, AST, ALT, BMP---- dx v70  She did not schedule an appt for labs today because she needs to talk to a nurse about adding another lab test to the list but she wouldnt tell me anything else--just needed to talk to nurse                         please call her at [phone] Initial call taken by: Jerolyn Shin,  June 07, 2010 12:53 PM  Follow-up for Phone Call        Left message for patient to call back.Marland KitchenMarland KitchenHarold Barban  June 07, 2010 4:18 PM  I called the patient for more info and she does not want additional blood work added, she would her liver looked at again due to a "spot" noted previously. Patient is aware that this will require u/s or other radiologic studies and she states that this is what she wants. Her labs are on the schedulre for 06/16/10. Please advise. Lucious Groves CMA  June 08, 2010 9:59 AM   labs as recommended need more info about the spot: get previous tests or OV Thara Searing E. Siobhan Zaro MD  June 08, 2010 1:35 PM   Additional Follow-up for Phone Call Additional follow up Details #1::        I cannot find any imaging studies that note a spot on the patient liver. (also checked e-chart). I called the patient to discuss and discovered with her help/investigation that she has not had those type of imaging studies done. Patient had labs that were abnormal in 11/2009 and somehow thought that meant that she had a spot on her liver. I made the patient aware that a liver function was not normal, but that did not mean that she had a spot on her liver. I also adivsed the patient that it is one of the labs to be repeated on 06/16/10. Patient expressed understanding so no additional orders for testing are needed at this time. Additional Follow-up by: Lucious Groves CMA,  June 08, 2010 2:15 PM    Additional Follow-up for Phone Call Additional follow up Details #2::    ok Maysun Meditz E. Khaliya Golinski MD  June 09, 2010 8:36 AM

## 2010-07-20 ENCOUNTER — Encounter: Payer: Self-pay | Admitting: Internal Medicine

## 2010-07-21 ENCOUNTER — Ambulatory Visit (INDEPENDENT_AMBULATORY_CARE_PROVIDER_SITE_OTHER): Payer: BC Managed Care – PPO | Admitting: Internal Medicine

## 2010-07-21 ENCOUNTER — Encounter: Payer: Self-pay | Admitting: Internal Medicine

## 2010-07-21 VITALS — BP 128/84 | HR 86 | Wt 242.8 lb

## 2010-07-21 DIAGNOSIS — R32 Unspecified urinary incontinence: Secondary | ICD-10-CM | POA: Insufficient documentation

## 2010-07-21 DIAGNOSIS — J309 Allergic rhinitis, unspecified: Secondary | ICD-10-CM

## 2010-07-21 DIAGNOSIS — N3943 Post-void dribbling: Secondary | ICD-10-CM

## 2010-07-21 LAB — POCT URINALYSIS DIPSTICK
Bilirubin, UA: NEGATIVE
Blood, UA: NEGATIVE
Glucose, UA: NEGATIVE
Ketones, UA: NEGATIVE
Leukocytes, UA: NEGATIVE
Protein, UA: NEGATIVE
Spec Grav, UA: 1.01
pH, UA: 8

## 2010-07-21 MED ORDER — MONTELUKAST SODIUM 10 MG PO TABS: 10.0000 mg | ORAL_TABLET | Freq: Every day | ORAL | Status: DC | PRN

## 2010-07-21 MED ORDER — PREDNISONE 10 MG PO TABS: ORAL_TABLET | ORAL | Status: DC

## 2010-07-21 MED ORDER — MOMETASONE FUROATE 50 MCG/ACT NA SUSP: 2.0000 | Freq: Every day | NASAL | Status: DC

## 2010-07-21 NOTE — Assessment & Plan Note (Addendum)
Urinary incont w/ sneezing  Information regards Kegel   exercises provided , rec to call if sx increase, gyn referal??

## 2010-07-21 NOTE — Assessment & Plan Note (Signed)
Sx c/w allergies , no infx on physical exam See instructions and Rxs

## 2010-07-21 NOTE — Patient Instructions (Addendum)
Prednisone for few days Nasonex, singulair, OTC zyrtec for months mucinex DM as needed for cough Kegel exercises (see info) Call if no better soon

## 2010-07-21 NOTE — Progress Notes (Signed)
  Subjective:    Patient ID: Kathleen Mcfarland, female    DOB: [birth year], 50 y.o.   MRN: [id number]  HPI   3 weeks history of cough, sneezing. Over-the-counter Sudafed not helping.   has tried Insurance underwriter without success.  she has similar symptoms yearly in the past responded to Singulair and Nasonex.  She also complains of urinary incontinence with sneezing.  Past Medical History  Diagnosis Date  . Allergic rhinitis   . Hypothyroidism   . Hypertension   . Osteoarthritis of knee    Past Surgical History  Procedure Date  . Tubal ligation   . Rotator cuff repair     Right    Social History: Single two children Occupation: Curator Former Smoker (was not a "heavy smoker") Alcohol use-no Drug use-no Regular exercise-yes (3/ week) caffeine - no  Review of Systems  no fever  mild sputum production, yellow in color Some sore throat Denies any itchy eyes or itchy nose. No wheezing. Mild sinus pressure     Objective:   Physical Exam  Constitutional: She appears well-developed. No distress.  HENT:  Head: Normocephalic and atraumatic.  Right Ear: External ear normal.  Left Ear: External ear normal.  Mouth/Throat: Oropharynx is clear and moist.        Face not tender to palpation  Cardiovascular: Normal rate, regular rhythm and normal heart sounds.   Pulmonary/Chest: Effort normal and breath sounds normal. She has no wheezes. She has no rales.          Assessment & Plan:

## 2010-08-20 ENCOUNTER — Other Ambulatory Visit: Payer: Self-pay | Admitting: Internal Medicine

## 2010-09-20 ENCOUNTER — Other Ambulatory Visit: Payer: Self-pay | Admitting: *Deleted

## 2010-09-20 MED ORDER — LEVOTHYROXINE SODIUM 175 MCG PO TABS: 175.0000 ug | ORAL_TABLET | Freq: Every day | ORAL | Status: DC

## 2010-10-11 ENCOUNTER — Ambulatory Visit (HOSPITAL_COMMUNITY)
Admission: RE | Admit: 2010-10-11 | Discharge: 2010-10-11 | Disposition: A | Discharge status: Home / Self Care | Payer: BC Managed Care – PPO | Source: Ambulatory Visit | Attending: Internal Medicine | Admitting: Internal Medicine

## 2010-10-11 DIAGNOSIS — Z1231 Encounter for screening mammogram for malignant neoplasm of breast: Secondary | ICD-10-CM | POA: Insufficient documentation

## 2010-12-22 ENCOUNTER — Other Ambulatory Visit: Payer: Self-pay | Admitting: Internal Medicine

## 2010-12-22 MED ORDER — LEVOTHYROXINE SODIUM 175 MCG PO TABS: 175.0000 ug | ORAL_TABLET | Freq: Every day | ORAL | Status: DC

## 2010-12-22 NOTE — Telephone Encounter (Signed)
Rx Done . 

## 2010-12-30 ENCOUNTER — Other Ambulatory Visit: Payer: Self-pay | Admitting: Internal Medicine

## 2010-12-30 DIAGNOSIS — Z0000 Encounter for general adult medical examination without abnormal findings: Secondary | ICD-10-CM

## 2011-01-03 ENCOUNTER — Other Ambulatory Visit (INDEPENDENT_AMBULATORY_CARE_PROVIDER_SITE_OTHER): Payer: BC Managed Care – PPO

## 2011-01-03 DIAGNOSIS — Z0000 Encounter for general adult medical examination without abnormal findings: Secondary | ICD-10-CM

## 2011-01-03 LAB — CBC WITH DIFFERENTIAL/PLATELET
Basophils Relative: 0.7 % (ref 0.0–3.0)
Eosinophils Absolute: 0.2 10*3/uL (ref 0.0–0.7)
Eosinophils Relative: 4 % (ref 0.0–5.0)
HCT: 37.9 % (ref 36.0–46.0)
Hemoglobin: 12.6 g/dL (ref 12.0–15.0)
MCHC: 33.2 g/dL (ref 30.0–36.0)
MCV: 83.8 fl (ref 78.0–100.0)
Monocytes Absolute: 0.3 10*3/uL (ref 0.1–1.0)
Neutro Abs: 1.8 10*3/uL (ref 1.4–7.7)
Neutrophils Relative %: 45 % (ref 43.0–77.0)
RBC: 4.52 Mil/uL (ref 3.87–5.11)
WBC: 4 10*3/uL — ABNORMAL LOW (ref 4.5–10.5)

## 2011-01-03 LAB — LIPID PANEL
HDL: 36.8 mg/dL — ABNORMAL LOW (ref >39.00)
LDL Cholesterol: 80 mg/dL (ref 0–99)
Total CHOL/HDL Ratio: 3
VLDL: 8.4 mg/dL (ref 0.0–40.0)

## 2011-01-03 LAB — HEPATIC FUNCTION PANEL
Bilirubin, Direct: 0.1 mg/dL (ref 0.0–0.3)
Total Bilirubin: 0.6 mg/dL (ref 0.3–1.2)

## 2011-01-03 LAB — BASIC METABOLIC PANEL
BUN: 10 mg/dL (ref 6–23)
CO2: 27 mEq/L (ref 19–32)
Calcium: 8.5 mg/dL (ref 8.4–10.5)
GFR: 122.45 mL/min (ref >60.00)
Glucose, Bld: 108 mg/dL — ABNORMAL HIGH (ref 70–99)
Sodium: 139 mEq/L (ref 135–145)

## 2011-01-03 NOTE — Progress Notes (Signed)
Labs only

## 2011-01-06 ENCOUNTER — Telehealth: Payer: Self-pay | Admitting: Internal Medicine

## 2011-01-06 NOTE — Telephone Encounter (Signed)
blood sugar slightly elevated (108) but essentially all labs normal ; will discuss in detail Monday

## 2011-01-06 NOTE — Telephone Encounter (Signed)
Left message on patient's cell voice mail.

## 2011-01-09 ENCOUNTER — Encounter: Payer: Self-pay | Admitting: Internal Medicine

## 2011-01-09 ENCOUNTER — Ambulatory Visit (INDEPENDENT_AMBULATORY_CARE_PROVIDER_SITE_OTHER): Payer: BC Managed Care – PPO | Admitting: Internal Medicine

## 2011-01-09 DIAGNOSIS — I10 Essential (primary) hypertension: Secondary | ICD-10-CM

## 2011-01-09 DIAGNOSIS — Z0000 Encounter for general adult medical examination without abnormal findings: Secondary | ICD-10-CM | POA: Insufficient documentation

## 2011-01-09 DIAGNOSIS — E039 Hypothyroidism, unspecified: Secondary | ICD-10-CM

## 2011-01-09 NOTE — Assessment & Plan Note (Signed)
No change , last TSH WNL

## 2011-01-09 NOTE — Assessment & Plan Note (Addendum)
Td 2008  last Pap smear 9-10 (had trichomonas) and 12-2009 negative  Likes to be refer to a female  gyn 2-12 mammogram normal  Colonoscopy 03-2010 neg, next 2016, see FH discussed diet and exercise All labs reviewed and discussed , recheck CBG next year

## 2011-01-09 NOTE — Assessment & Plan Note (Signed)
No change, rec to check amb BPs

## 2011-01-09 NOTE — Progress Notes (Signed)
  Subjective:    Patient ID: Kathleen Mcfarland, female    DOB: [birth year], 50 y.o.   MRN: [id number]  HPI CPX  Past Medical History: Allergic rhinitis Hypothyroidism HYPERTENSION OSTEOARTHRITIS, KNEE   G2P2  Past Surgical History: Tubal ligation Rotator cuff repair (rt shoulder)  Family History: breast cancer--no CAD--father has heart disease diagnosed at age 86  colon cancer-- great uncle dx at age 55 great aunt dx at age 85  GM age of dx in her 56  colon polyps: 2 sisters dx in her 80 and 82 y/o  DM  (+) , F M HTN (+) F M stroke - no hepatitis C--M    Review of Systems  Constitutional: Negative for fever and fatigue.  Respiratory: Negative for cough and shortness of breath.   Cardiovascular: Negative for chest pain and leg swelling.  Gastrointestinal: Negative for abdominal pain and blood in stool.  Genitourinary: Negative for dysuria, hematuria and difficulty urinating.       Objective:   Physical Exam  Constitutional: She is oriented to person, place, and time. She appears well-developed and well-nourished. No distress.  HENT:  Head: Normocephalic and atraumatic.  Neck: No thyromegaly present.  Cardiovascular: Normal rate, regular rhythm and normal heart sounds.   No murmur heard. Pulmonary/Chest: Effort normal and breath sounds normal. No respiratory distress. She has no wheezes. She has no rales.  Musculoskeletal: She exhibits no edema.  Neurological: She is alert and oriented to person, place, and time.  Skin: Skin is warm and dry. She is not diaphoretic.  Psychiatric: She has a normal mood and affect. Her behavior is normal. Judgment and thought content normal.          Assessment & Plan:

## 2011-01-09 NOTE — Patient Instructions (Signed)
Check the  blood pressure 2 or 3 times a week, be sure it is less than 140/85. If it is consistently higher, let me know  

## 2011-01-19 ENCOUNTER — Telehealth: Payer: Self-pay | Admitting: Internal Medicine

## 2011-01-19 NOTE — Telephone Encounter (Signed)
IN REFERENCE TO GYNECOLOGY REFERRAL, WHEN I NOTIFIED PT OF HER APPT ON 02-01-11 WITH FEMALE GYN, SHE TOLD ME TO CANCEL THE APPT.  STATES SHE WANTS TO SCH'D HER OWN APPT ELSEWHERE & IS CONSIDERING LEAVING ALL [facility], NO REASON GIVEN.  I HAVE CANCELLED HER GYN APPT.

## 2011-01-20 NOTE — Telephone Encounter (Signed)
Noted  

## 2011-02-15 LAB — DIFFERENTIAL
Basophils Absolute: 0.1
Basophils Relative: 2 — ABNORMAL HIGH
Eosinophils Absolute: 0.1
Eosinophils Relative: 1
Lymphocytes Relative: 42
Lymphs Abs: 2.7
Monocytes Absolute: 0.5
Monocytes Relative: 8
Neutro Abs: 3.1
Neutrophils Relative %: 48

## 2011-02-15 LAB — CBC
HCT: 37.3
Hemoglobin: 12.8
MCHC: 34.2
MCV: 81.1
Platelets: 280
RBC: 4.6
RDW: 13.6
WBC: 6.5

## 2011-02-15 LAB — URINALYSIS, ROUTINE W REFLEX MICROSCOPIC
Bilirubin Urine: NEGATIVE
Glucose, UA: NEGATIVE
Hgb urine dipstick: NEGATIVE
Nitrite: NEGATIVE
Protein, ur: NEGATIVE
Specific Gravity, Urine: 1.021
Urobilinogen, UA: 0.2
pH: 5.5

## 2011-02-15 LAB — BASIC METABOLIC PANEL
BUN: 14
Chloride: 98
Creatinine, Ser: 0.87
Glucose, Bld: 100 — ABNORMAL HIGH
Potassium: 3.3 — ABNORMAL LOW

## 2011-02-15 LAB — BASIC METABOLIC PANEL WITH GFR
CO2: 28
Calcium: 9.4
GFR calc Af Amer: >60
GFR calc non Af Amer: >60
Sodium: 135

## 2011-02-18 ENCOUNTER — Other Ambulatory Visit: Payer: Self-pay | Admitting: Internal Medicine

## 2011-03-25 ENCOUNTER — Other Ambulatory Visit: Payer: Self-pay | Admitting: Internal Medicine

## 2011-03-29 ENCOUNTER — Ambulatory Visit (INDEPENDENT_AMBULATORY_CARE_PROVIDER_SITE_OTHER): Payer: BC Managed Care – PPO

## 2011-03-29 DIAGNOSIS — Z23 Encounter for immunization: Secondary | ICD-10-CM

## 2011-04-12 ENCOUNTER — Encounter (HOSPITAL_BASED_OUTPATIENT_CLINIC_OR_DEPARTMENT_OTHER)
Admission: RE | Admit: 2011-04-12 | Discharge: 2011-04-12 | Disposition: A | Discharge status: Home / Self Care | Payer: BC Managed Care – PPO | Source: Ambulatory Visit | Attending: Orthopedic Surgery | Admitting: Orthopedic Surgery

## 2011-04-12 ENCOUNTER — Encounter (HOSPITAL_BASED_OUTPATIENT_CLINIC_OR_DEPARTMENT_OTHER): Payer: Self-pay | Admitting: *Deleted

## 2011-04-12 ENCOUNTER — Other Ambulatory Visit: Payer: Self-pay

## 2011-04-12 LAB — BASIC METABOLIC PANEL
CO2: 27 mEq/L (ref 19–32)
Calcium: 9.1 mg/dL (ref 8.4–10.5)
Creatinine, Ser: 0.61 mg/dL (ref 0.50–1.10)
GFR calc Af Amer: >90 mL/min (ref >90)
GFR calc non Af Amer: >90 mL/min (ref >90)

## 2011-04-12 NOTE — Progress Notes (Signed)
To come in for ekg bmet To bring all meds and overnight bag Pt denies osa or snoring Denies any anesth problems

## 2011-04-13 ENCOUNTER — Ambulatory Visit (HOSPITAL_BASED_OUTPATIENT_CLINIC_OR_DEPARTMENT_OTHER)
Admission: RE | Admit: 2011-04-13 | Discharge: 2011-04-13 | Disposition: A | Discharge status: Home / Self Care | Payer: BC Managed Care – PPO | Source: Ambulatory Visit | Attending: Orthopedic Surgery | Admitting: Orthopedic Surgery

## 2011-04-13 ENCOUNTER — Encounter (HOSPITAL_BASED_OUTPATIENT_CLINIC_OR_DEPARTMENT_OTHER): Payer: Self-pay | Admitting: Anesthesiology

## 2011-04-13 ENCOUNTER — Ambulatory Visit (HOSPITAL_BASED_OUTPATIENT_CLINIC_OR_DEPARTMENT_OTHER): Payer: BC Managed Care – PPO | Admitting: Anesthesiology

## 2011-04-13 ENCOUNTER — Encounter (HOSPITAL_BASED_OUTPATIENT_CLINIC_OR_DEPARTMENT_OTHER)
Admission: RE | Disposition: A | Discharge status: Home / Self Care | Payer: Self-pay | Source: Ambulatory Visit | Attending: Orthopedic Surgery

## 2011-04-13 ENCOUNTER — Encounter (HOSPITAL_BASED_OUTPATIENT_CLINIC_OR_DEPARTMENT_OTHER): Payer: Self-pay | Admitting: *Deleted

## 2011-04-13 DIAGNOSIS — M24019 Loose body in unspecified shoulder: Secondary | ICD-10-CM | POA: Insufficient documentation

## 2011-04-13 DIAGNOSIS — M67919 Unspecified disorder of synovium and tendon, unspecified shoulder: Secondary | ICD-10-CM | POA: Insufficient documentation

## 2011-04-13 DIAGNOSIS — E039 Hypothyroidism, unspecified: Secondary | ICD-10-CM | POA: Insufficient documentation

## 2011-04-13 DIAGNOSIS — I10 Essential (primary) hypertension: Secondary | ICD-10-CM | POA: Insufficient documentation

## 2011-04-13 DIAGNOSIS — M719 Bursopathy, unspecified: Secondary | ICD-10-CM | POA: Insufficient documentation

## 2011-04-13 DIAGNOSIS — Z01818 Encounter for other preprocedural examination: Secondary | ICD-10-CM | POA: Insufficient documentation

## 2011-04-13 DIAGNOSIS — Z4789 Encounter for other orthopedic aftercare: Secondary | ICD-10-CM

## 2011-04-13 DIAGNOSIS — M25819 Other specified joint disorders, unspecified shoulder: Secondary | ICD-10-CM | POA: Insufficient documentation

## 2011-04-13 DIAGNOSIS — M24119 Other articular cartilage disorders, unspecified shoulder: Secondary | ICD-10-CM | POA: Insufficient documentation

## 2011-04-13 DIAGNOSIS — Z01812 Encounter for preprocedural laboratory examination: Secondary | ICD-10-CM | POA: Insufficient documentation

## 2011-04-13 HISTORY — PX: SHOULDER ARTHROSCOPY: SHX128

## 2011-04-13 SURGERY — ARTHROSCOPY, SHOULDER
Anesthesia: General | Site: Shoulder | Laterality: Right | Wound class: Clean

## 2011-04-13 MED ORDER — OXYCODONE-ACETAMINOPHEN 5-325 MG PO TABS
1.0000 | ORAL_TABLET | ORAL | Status: DC | PRN
  Administered 2011-04-13: 1 via ORAL

## 2011-04-13 MED ORDER — SODIUM CHLORIDE 0.9 % IR SOLN
Status: DC | PRN
  Administered 2011-04-13: 3000 mL

## 2011-04-13 MED ORDER — BUPIVACAINE-EPINEPHRINE PF 0.5-1:200000 % IJ SOLN
INTRAMUSCULAR | Status: DC | PRN
  Administered 2011-04-13: 30 mL

## 2011-04-13 MED ORDER — DEXAMETHASONE SODIUM PHOSPHATE 4 MG/ML IJ SOLN
INTRAMUSCULAR | Status: DC | PRN
  Administered 2011-04-13: 10 mg via INTRAVENOUS

## 2011-04-13 MED ORDER — PROPOFOL 10 MG/ML IV EMUL
INTRAVENOUS | Status: DC | PRN
  Administered 2011-04-13: 200 mg via INTRAVENOUS

## 2011-04-13 MED ORDER — CEFAZOLIN SODIUM 1-5 GM-% IV SOLN
1.0000 g | INTRAVENOUS | Status: AC
  Administered 2011-04-13: 2 g via INTRAVENOUS

## 2011-04-13 MED ORDER — HYDROMORPHONE HCL PF 1 MG/ML IJ SOLN: 0.2500 mg | INTRAMUSCULAR | Status: DC | PRN

## 2011-04-13 MED ORDER — FENTANYL CITRATE 0.05 MG/ML IJ SOLN
50.0000 ug | INTRAMUSCULAR | Status: DC | PRN
  Administered 2011-04-13: 100 ug via INTRAVENOUS

## 2011-04-13 MED ORDER — PROMETHAZINE HCL 25 MG/ML IJ SOLN: 6.2500 mg | INTRAMUSCULAR | Status: DC | PRN

## 2011-04-13 MED ORDER — LIDOCAINE HCL (CARDIAC) 20 MG/ML IV SOLN
INTRAVENOUS | Status: DC | PRN
  Administered 2011-04-13: 50 mg via INTRAVENOUS

## 2011-04-13 MED ORDER — FENTANYL CITRATE 0.05 MG/ML IJ SOLN
INTRAMUSCULAR | Status: DC | PRN
  Administered 2011-04-13: 100 ug via INTRAVENOUS

## 2011-04-13 MED ORDER — LACTATED RINGERS IV SOLN
INTRAVENOUS | Status: DC
  Administered 2011-04-13 (×2): via INTRAVENOUS

## 2011-04-13 MED ORDER — CHLORHEXIDINE GLUCONATE 4 % EX LIQD
60.0000 mL | Freq: Once | CUTANEOUS | Status: AC
  Administered 2011-04-13: 4 via TOPICAL

## 2011-04-13 MED ORDER — SUCCINYLCHOLINE CHLORIDE 20 MG/ML IJ SOLN
INTRAMUSCULAR | Status: DC | PRN
  Administered 2011-04-13: 100 mg via INTRAVENOUS

## 2011-04-13 MED ORDER — MIDAZOLAM HCL 2 MG/2ML IJ SOLN
1.0000 mg | INTRAMUSCULAR | Status: DC | PRN
  Administered 2011-04-13: 2 mg via INTRAVENOUS

## 2011-04-13 SURGICAL SUPPLY — 76 items
ANCH SUT SWLK 19.1X5.5 CLS EL (Anchor) ×2 IMPLANT
ANCHOR PEEK SWIVEL LOCK 5.5 (Anchor) ×2 IMPLANT
APL SKNCLS STERI-STRIP NONHPOA (GAUZE/BANDAGES/DRESSINGS)
BENZOIN TINCTURE PRP APPL 2/3 (GAUZE/BANDAGES/DRESSINGS) IMPLANT
BLADE CUTTER GATOR 3.5 (BLADE) ×2 IMPLANT
BLADE CUTTER MENIS 5.5 (BLADE) IMPLANT
BLADE GREAT WHITE 4.2 (BLADE) ×2 IMPLANT
BLADE SURG 15 STRL LF DISP TIS (BLADE) IMPLANT
BLADE SURG 15 STRL SS (BLADE)
BUR OVAL 6.0 (BURR) ×2 IMPLANT
CANISTER OMNI JUG 16 LITER (MISCELLANEOUS) ×2 IMPLANT
CANISTER SUCTION 2500CC (MISCELLANEOUS) IMPLANT
CANNULA TWIST IN 8.25X7CM (CANNULA) ×1 IMPLANT
CLOTH BEACON ORANGE TIMEOUT ST (SAFETY) ×2 IMPLANT
DECANTER SPIKE VIAL GLASS SM (MISCELLANEOUS) IMPLANT
DRAPE OEC MINIVIEW 54X84 (DRAPES) IMPLANT
DRAPE STERI 35X30 U-POUCH (DRAPES) ×2 IMPLANT
DRAPE U-SHAPE 47X51 STRL (DRAPES) ×2 IMPLANT
DRAPE U-SHAPE 76X120 STRL (DRAPES) ×4 IMPLANT
DRSG PAD ABDOMINAL 8X10 ST (GAUZE/BANDAGES/DRESSINGS) ×2 IMPLANT
DURAPREP 26ML APPLICATOR (WOUND CARE) ×2 IMPLANT
ELECT MENISCUS 165MM 90D (ELECTRODE) ×2 IMPLANT
ELECT NDL TIP 2.8 STRL (NEEDLE) IMPLANT
ELECT NEEDLE TIP 2.8 STRL (NEEDLE) IMPLANT
ELECT REM PT RETURN 9FT ADLT (ELECTROSURGICAL) ×2
ELECTRODE REM PT RTRN 9FT ADLT (ELECTROSURGICAL) ×1 IMPLANT
GAUZE XEROFORM 1X8 LF (GAUZE/BANDAGES/DRESSINGS) ×2 IMPLANT
GLOVE BIO SURGEON STRL SZ 6.5 (GLOVE) ×1 IMPLANT
GLOVE BIOGEL PI IND STRL 7.0 (GLOVE) IMPLANT
GLOVE BIOGEL PI IND STRL 8 (GLOVE) ×1 IMPLANT
GLOVE BIOGEL PI INDICATOR 7.0 (GLOVE) ×1
GLOVE BIOGEL PI INDICATOR 8 (GLOVE) ×1
GLOVE ORTHO TXT STRL SZ7.5 (GLOVE) ×4 IMPLANT
GOWN BRE IMP PREV XXLGXLNG (GOWN DISPOSABLE) ×2 IMPLANT
GOWN PREVENTION PLUS XLARGE (GOWN DISPOSABLE) ×3 IMPLANT
IMMOBILIZER SHOULDER XLGE (ORTHOPEDIC SUPPLIES) ×1 IMPLANT
NDL SCORPION MULTI FIRE (NEEDLE) IMPLANT
NDL SUT 6 .5 CRC .975X.05 MAYO (NEEDLE) IMPLANT
NEEDLE MAYO TAPER (NEEDLE)
NEEDLE SCORPION MULTI FIRE (NEEDLE) ×4 IMPLANT
NS IRRIG 1000ML POUR BTL (IV SOLUTION) IMPLANT
PACK ARTHROSCOPY DSU (CUSTOM PROCEDURE TRAY) ×2 IMPLANT
PACK BASIN DAY SURGERY FS (CUSTOM PROCEDURE TRAY) ×2 IMPLANT
PASSER SUT SWANSON 36MM LOOP (INSTRUMENTS) IMPLANT
PENCIL BUTTON HOLSTER BLD 10FT (ELECTRODE) ×2 IMPLANT
SET ARTHROSCOPY TUBING (MISCELLANEOUS) ×2
SET ARTHROSCOPY TUBING LN (MISCELLANEOUS) ×1 IMPLANT
SLEEVE SCD COMPRESS KNEE MED (MISCELLANEOUS) ×1 IMPLANT
SLING ARM FOAM STRAP LRG (SOFTGOODS) IMPLANT
SLING ARM FOAM STRAP MED (SOFTGOODS) IMPLANT
SLING ARM FOAM STRAP XLG (SOFTGOODS) ×1 IMPLANT
SLING ARM IMMOBILIZER LRG (SOFTGOODS) IMPLANT
SLING ARM IMMOBILIZER MED (SOFTGOODS) IMPLANT
SPONGE GAUZE 4X4 12PLY (GAUZE/BANDAGES/DRESSINGS) ×4 IMPLANT
SPONGE LAP 4X18 X RAY DECT (DISPOSABLE) IMPLANT
STRIP CLOSURE SKIN 1/2X4 (GAUZE/BANDAGES/DRESSINGS) IMPLANT
SUCTION FRAZIER TIP 10 FR DISP (SUCTIONS) IMPLANT
SUT ETHIBOND 2 OS 4 DA (SUTURE) IMPLANT
SUT ETHILON 2 0 FS 18 (SUTURE) IMPLANT
SUT ETHILON 3 0 PS 1 (SUTURE) IMPLANT
SUT FIBERWIRE #2 38 T-5 BLUE (SUTURE)
SUT RETRIEVER MED (INSTRUMENTS) IMPLANT
SUT STEEL 4 (SUTURE) IMPLANT
SUT STEEL 5 (SUTURE) IMPLANT
SUT TIGER TAPE 7 IN WHITE (SUTURE) ×1 IMPLANT
SUT VIC AB 0 CT1 27 (SUTURE)
SUT VIC AB 0 CT1 27XBRD ANBCTR (SUTURE) IMPLANT
SUT VIC AB 2-0 SH 27 (SUTURE)
SUT VIC AB 2-0 SH 27XBRD (SUTURE) IMPLANT
SUT VIC AB 3-0 FS2 27 (SUTURE) IMPLANT
SUTURE FIBERWR #2 38 T-5 BLUE (SUTURE) IMPLANT
TAPE CLOTH SURG 6X10 WHT LF (GAUZE/BANDAGES/DRESSINGS) ×1 IMPLANT
TAPE FIBER 2MM 7IN #2 BLUE (SUTURE) ×1 IMPLANT
TOWEL OR 17X24 6PK STRL BLUE (TOWEL DISPOSABLE) ×2 IMPLANT
WATER STERILE IRR 1000ML POUR (IV SOLUTION) ×2 IMPLANT
YANKAUER SUCT BULB TIP NO VENT (SUCTIONS) IMPLANT

## 2011-04-13 NOTE — Anesthesia Preprocedure Evaluation (Signed)
Anesthesia Evaluation  Patient identified by MRN, date of birth, ID band Patient awake    Reviewed: Allergy & Precautions, H&P , NPO status , Patient's Chart, lab work & pertinent test results  Airway Mallampati: III TM Distance: >3 FB Neck ROM: Full    Dental No notable dental hx. (+) Teeth Intact and Dental Advisory Given   Pulmonary  clear to auscultation        Cardiovascular hypertension, Pt. on medications Regular Normal    Neuro/Psych    GI/Hepatic negative GI ROS, Neg liver ROS,   Endo/Other  Hypothyroidism (takes Synthroid) Morbid obesity  Renal/GU negative Renal ROS     Musculoskeletal   Abdominal (+) obese,   Peds  Hematology   Anesthesia Other Findings   Reproductive/Obstetrics                           Anesthesia Physical Anesthesia Plan  ASA: II  Anesthesia Plan: General   Post-op Pain Management:    Induction: Intravenous  Airway Management Planned: Oral ETT  Additional Equipment:   Intra-op Plan:   Post-operative Plan: Extubation in OR  Informed Consent: I have reviewed the patients History and Physical, chart, labs and discussed the procedure including the risks, benefits and alternatives for the proposed anesthesia with the patient or authorized representative who has indicated his/her understanding and acceptance.   Dental advisory given  Plan Discussed with: CRNA and Surgeon  Anesthesia Plan Comments: (Plan routine monitors, GETA with interscalene block for post op analgesia)        Anesthesia Quick Evaluation

## 2011-04-13 NOTE — Brief Op Note (Signed)
04/13/2011  10:51 AM  PATIENT:  Kathleen Mcfarland  50 y.o. female  PRE-OPERATIVE DIAGNOSIS:  impingement right shoulder  POST-OPERATIVE DIAGNOSIS:  impingement right shoulder and rotaor cuff tear and chondrylar fracture humeral head  PROCEDURE:  Procedure(s): Right ARTHROSCOPY SHOULDER, revision acromioplasty, rc repair and humeral head microfracture  SURGEON:  Surgeon(s): Loreta Ave, MD  PHYSICIAN ASSISTANT: Zonia Kief M     ANESTHESIA:   general  EBL:  Total I/O In: 1000 [I.V.:1000] Out: -   BLOOD ADMINISTERED:none   SPECIMEN:  No Specimen  DISPOSITION OF SPECIMEN:  N/A      PATIENT DISPOSITION:  PACU - hemodynamically stable.

## 2011-04-13 NOTE — Transfer of Care (Signed)
Immediate Anesthesia Transfer of Care Note  Patient: Kathleen Mcfarland  Procedure(s) Performed:  ARTHROSCOPY SHOULDER - Right Shoulder Arthroscopy with Acrmioploasty, Arhtroscopic rotator cuff repair and Microfracture humeral head  Patient Location: PACU  Anesthesia Type: General  Level of Consciousness: awake and alert   Airway & Oxygen Therapy: Patient Spontanous Breathing and Patient connected to face mask oxygen  Post-op Assessment: Report given to PACU RN and Post -op Vital signs reviewed and stable  Post vital signs: Reviewed and stable  Complications: No apparent anesthesia complications

## 2011-04-13 NOTE — Anesthesia Postprocedure Evaluation (Signed)
  Anesthesia Post-op Note  Patient: Kathleen Mcfarland  Procedure(s) Performed:  ARTHROSCOPY SHOULDER - Right Shoulder Arthroscopy with Acrmioploasty, Arhtroscopic rotator cuff repair and Microfracture humeral head  Patient Location: PACU  Anesthesia Type: GA combined with regional for post-op pain  Level of Consciousness: awake, alert  and oriented  Airway and Oxygen Therapy: Patient Spontanous Breathing  Post-op Pain: none  Post-op Assessment: Post-op Vital signs reviewed, Patient's Cardiovascular Status Stable, Respiratory Function Stable, Patent Airway, No signs of Nausea or vomiting and Pain level controlled  Post-op Vital Signs: Reviewed and stable  Complications: No apparent anesthesia complications

## 2011-04-13 NOTE — Progress Notes (Signed)
Assisted Dr. Jean Rosenthal with right, infraclavicular block. Side rails up, monitors on throughout procedure. See vital signs in flow sheet. Tolerated Procedure well.

## 2011-04-13 NOTE — Anesthesia Procedure Notes (Addendum)
Anesthesia Regional Block:  Interscalene brachial plexus block  Pre-Anesthetic Checklist: ,, timeout performed, Correct Patient, Correct Site, Correct Laterality, Correct Procedure, Correct Position, site marked, Risks and benefits discussed,  Surgical consent,  Pre-op evaluation,  At surgeon's request and post-op pain management  Laterality: Right  Prep: chloraprep       Needles:  Injection technique: Single-shot  Needle Type: Stimulator Needle - 40      Needle Gauge: 22 and 22 G    Additional Needles:  Procedures: nerve stimulator Interscalene brachial plexus block  Nerve Stimulator or Paresthesia:  Response: forearm twitch, 0.45 mA, 2 ms,   Additional Responses:   Narrative:  Start time: 04/13/2011 8:55 AM End time: 04/13/2011 9:00 AM Injection made incrementally with aspirations every 5 mL.  Performed by: Personally  Anesthesiologist: Sandford Craze, MD  Additional Notes: Pt identified in Holding room.  Monitors applied. Working IV access confirmed. Sterile prep.  #22ga PNS to forearm twitch at 0.52mA threshold.  30cc 0.5% Bupivacaine with 1:200k epi injected incrementally after negative test dose.  Patient asymptomatic, VSS, no heme aspirated, tolerated well.   Sandford Craze, MD  Interscalene brachial plexus block Procedure Name: Intubation Performed by: Sharyne Richters Pre-anesthesia Checklist: Patient identified, Emergency Drugs available, Suction available, Patient being monitored and Timeout performed Patient Re-evaluated:Patient Re-evaluated prior to inductionOxygen Delivery Method: Circle System Utilized Preoxygenation: Pre-oxygenation with 100% oxygen Intubation Type: IV induction Tube type: Oral Tube size: 7.0 mm Number of attempts: 1 Airway Equipment and Method: video-laryngoscopy Placement Confirmation: ETT inserted through vocal cords under direct vision,  breath sounds checked- equal and bilateral and positive ETCO2 Secured at: 22 cm Tube secured with:  Tape Dental Injury: Teeth and Oropharynx as per pre-operative assessment  Comments: Easy Video Glide intubation

## 2011-04-13 NOTE — Op Note (Signed)
NAMEORELIA, BRANDSTETTER NO.:  [id number]  MEDICAL RECORD NO.:  [id number]  LOCATION:  [facility]                          FACILITY:  [facility]  PHYSICIAN:  Loreta Ave, M.D. DATE OF BIRTH:  30-Oct-1960  DATE OF PROCEDURE:  04/13/2011 DATE OF DISCHARGE:  10/11/2010                              OPERATIVE REPORT   PREOPERATIVE DIAGNOSIS:  Right shoulder extensive partial possible complete rotator cuff tear.  Recurrent impingement.  Status post excision distal clavicle.  POSTOPERATIVE DIAGNOSES:  Right shoulder extensive partial possible complete rotator cuff tear.  Recurrent impingement.  Status post excision distal clavicle with a functional completed tear supraspinatus tendon.  Also tearing anterior labrum and a grade 4 chondral lesion on the top of the humeral head more than a centimeter in diameter with chondral loose body.  PROCEDURE:  Right shoulder exam under anesthesia, arthroscopy. Debridement of labrum.  Removal of chondral loose bodies,.  Micro fracturing humeral head.  Debridement and arthroscopic assisted repair rotator cuff Fiber Weave suture x2, swivel lock anchors x2.  Bursectomy acromioplasty coracoacromial ligament release.  SURGEON:  Loreta Ave, MD  ASSISTANT:  Zonia Kief, PA present throughout the entire case, necessary for timely completion of procedure.  ANESTHESIA:  General.  BLOOD LOSS:  Minimal.  SPECIMENS:  None.  CULTURES:  None.  COMPLICATION:  None.  DRESSING:  Soft compressive with shoulder immobilizer.  PROCEDURE:  The patient was brought to the operating room and placed on the operating room table in supine position.  After adequate anesthesia had been obtained, shoulder examined.  Full motion, stable shoulder. Placed in a beach-chair position on the shoulder positioner, prepped and draped in usual sterile fashion.  Three portals anterior, posterior and lateral.  Arthroscope introduced, shoulder distended and  inspected. Complex tearing anterior labrum debrided.  Partial tearing top half of the subscap debrided.  Biceps tendon and biceps anchor intact.  Marked partial tearing entire crescent region supraspinatus tendon. Functionally, a full-thickness tear.  Debrided from above and below completing of repair.  From above, some recurrent impingement from acromion.  Revision acromioplasty to a type 1 acromion releasing the CA ligament and excising the bursa.  Adequate distal clavicle excision confirmed.  Through the lateral portal and adding a fourth posterolateral portal, the cuff was mobilized and captured with 2 horizontal mattress sutures and then repaired down to 2 punch holes in the tuberosity with swivel lock anchors.  The back one being a little bit over the top.  At completion, nice firm watertight closure of the cuff achieved.  Adequacy of decompression and cuff repair confirmed.  Instruments and fluid were removed.  Portals closed with nylon.  Sterile compressive dressing applied.  Shoulder immobilizer applied.  Anesthesia reversed.  Brought to the recovery room.  Tolerated the surgery well.  No complications.     Loreta Ave, M.D.     DFM/MEDQ  D:  04/13/2011  T:  04/13/2011  Job:  [id number]

## 2011-04-13 NOTE — OR Nursing (Signed)
Patient extremely concerned about false eyelashes being "messed up" during procedure.  Patient advised by Dr. Jean Rosenthal and Sharyne Richters that eye protection was essential and staff would be very careful when removing tape over eyes.

## 2011-04-13 NOTE — Interval H&P Note (Signed)
History and Physical Interval Note:  04/13/2011 7:01 AM  Kathleen Mcfarland  has presented today for surgery, with the diagnosis of impingement  The various methods of treatment have been discussed with the patient and family. After consideration of risks, benefits and other options for treatment, the patient has consented to  Procedure(s): ARTHROSCOPY SHOULDER as a surgical intervention .  The patients' history has been reviewed, patient examined, no change in status, stable for surgery.  I have reviewed the patients' chart and labs.  Questions were answered to the patient's satisfaction.     Jafet Wissing F

## 2011-04-13 NOTE — H&P (Signed)
[address]   [address] [place], [place] [address] [phone] A Division of [facility]  Loreta Ave, M.D.     Robert A. Thurston Hole, M.D.     Lunette Stands, M.D. Eulas Post, M.D.    Buford Dresser, M.D. Estell Harpin, M.D. Ralene Cork, D.O.          Genene Churn. Barry Dienes, PA-C            Kirstin A. Shepperson, PA-C [place], [place]   RE: Kathleen, Mcfarland PROGRESS NOTE: 01-24-11 This is a 50 year old who comes in for second opinion on right shoulder pain. She had right shoulder surgery in 2000. She was doing well until the past 2 months. She has off and on pain 5/10 in intensity sharp in the anterolateral aspect of the shoulder no radiation no numbness or tingling distally. No shoulder injury. She was seen at [facility] and was given a subacromial injection with 20% relief of symptoms for 1-2 days. She was given Etodolac for pain and was referred to physical therapy which she refused.   Past medical history: significant for hypertension and hyperthyroidism. She's on HCTZ and Synthroid. No known drug allergies. Social history: she works as a Production designer, theatre/television/film for [employer] and does not smoke. Family history: significant for diabetes and coronary artery disease.  EXAMINATION: Well developed well nourished in no acute distress. Eyes: extraocular motion is intact. Lymph: no cervical or axillary lymph nodes palpable. Cardiovascular: no pedal edema. Respiratory: no increase in respiratory effort.  Psych: judgment and insight intact. Her right shoulder skin is intact without swelling or inflammation. She has full range of motion with pain at extremes of flexion and extension. Hawkins and Neer test positive for rotator cuff impingement. O'Brien's test positive for labral tear. Rotator cuff empty can test is negative but produces pain. Mild tenderness on the bicipital groove. Speed test negative. Sensation is intact distally.   X-RAYS: Review of x-rays from 9/7  shows subacromial spur and post surgical changes AC joint resection. No fracture dislocation or calcification.  IMPRESSION: Right shoulder rotator cuff tear with rotator cuff impingement. Right shoulder labral tear.  PLAN: We'll obtain an MRI arthrogram due to the fact that she's had this problem for 2 months without improvement and no improvement with injection or medications. We'll contact her afterwards to go over results and determine further definitive treatment at that time.  Loreta Ave, M.D.  Electronically verified by Loreta Ave, M.D. DFM:kh D 01-25-11 T 09-27-12[facility] [address]   [address] [place], [place] [address] [phone] A Division of [facility]  Loreta Ave, M.D.     Robert A. Thurston Hole, M.D.     Lunette Stands, M.D. Eulas Post, M.D.    Buford Dresser, M.D. Estell Harpin, M.D. Ralene Cork, D.O.          Genene Churn. Barry Dienes, PA-C            Kirstin A. Shepperson, PA-C [place], OPA-C   RE: Kathleen, Mcfarland                                [id number]      DOB: 1960/08/09 PROGRESS NOTE: 02-07-11 [age] year-old black female with a history of right shoulder pain.  Returns for review of her right shoulder MR arthrogram performed on February 02, 2011.  Scan showed partial thickness undersurface rotator cuff tear, along with bursitis.  She continues to have ongoing pain with overhead activity and reaching behind her back.  No relief with subacromial Depo-Medrol/Marcaine injection.  Denies cervical spine or radicular component.    EXAMINATION: Pleasant black female with somewhat of a flat affect, alert and oriented x 3 and in no acute distress.  Right shoulder she has some decreased range of motion, which may be due to pain.  Positive impingement test.  Negative drop arm.  Pain with supraspinatus resistance.  Cervical spine unremarkable.  Neurovascularly intact.  Skin warm and  dry.  No increase in respiratory effort.   IMPRESSION: Persistent right shoulder pain.  Partial thickness rotator cuff tear which may be complete after reviewing actual MRI films.  PLAN:  Patient advised that the best treatment option at this point would be right shoulder scope with debridement, possible revision acromioplasty and possible arthroscopically assisted rotator cuff repair.  Surgical procedure, along with potential rehab/recovery time discussed with her in detail.  All questions answered.    Loreta Ave, M.D.   Electronically verified by Loreta Ave, M.D. DFM(JMO):jjh D 02-07-11 T 02-08-11

## 2011-04-14 ENCOUNTER — Encounter (HOSPITAL_BASED_OUTPATIENT_CLINIC_OR_DEPARTMENT_OTHER): Payer: Self-pay | Admitting: Orthopedic Surgery

## 2011-05-25 ENCOUNTER — Telehealth: Payer: Self-pay | Admitting: Internal Medicine

## 2011-05-25 NOTE — Telephone Encounter (Signed)
Refill- synthroid tab. Take one tablet by mouth every day. Qty 35 last fill 4.21.12

## 2011-05-26 MED ORDER — LEVOTHYROXINE SODIUM 175 MCG PO TABS: 175.0000 ug | ORAL_TABLET | Freq: Every day | ORAL | Status: DC

## 2011-05-26 NOTE — Telephone Encounter (Signed)
RX SENT TO PHARMACY

## 2011-05-26 NOTE — Telephone Encounter (Signed)
Patient is calling again regarding prescription.

## 2011-06-15 ENCOUNTER — Other Ambulatory Visit: Payer: Self-pay | Admitting: Internal Medicine

## 2011-06-15 NOTE — Telephone Encounter (Signed)
Refill done.  

## 2011-09-19 ENCOUNTER — Other Ambulatory Visit (HOSPITAL_COMMUNITY): Payer: Self-pay | Admitting: Family Medicine

## 2011-09-19 DIAGNOSIS — Z1231 Encounter for screening mammogram for malignant neoplasm of breast: Secondary | ICD-10-CM

## 2011-10-17 ENCOUNTER — Ambulatory Visit (HOSPITAL_COMMUNITY)
Admission: RE | Admit: 2011-10-17 | Discharge: 2011-10-17 | Disposition: A | Discharge status: Home / Self Care | Payer: BC Managed Care – PPO | Source: Ambulatory Visit | Attending: Family Medicine | Admitting: Family Medicine

## 2011-10-17 ENCOUNTER — Other Ambulatory Visit: Payer: Self-pay | Admitting: Family Medicine

## 2011-10-17 DIAGNOSIS — N63 Unspecified lump in unspecified breast: Secondary | ICD-10-CM

## 2011-10-17 DIAGNOSIS — Z1231 Encounter for screening mammogram for malignant neoplasm of breast: Secondary | ICD-10-CM

## 2011-10-18 ENCOUNTER — Ambulatory Visit: Payer: BC Managed Care – PPO | Admitting: Advanced Practice Midwife

## 2011-10-31 ENCOUNTER — Ambulatory Visit
Admission: RE | Admit: 2011-10-31 | Discharge: 2011-10-31 | Disposition: A | Discharge status: Home / Self Care | Payer: BC Managed Care – PPO | Source: Ambulatory Visit | Attending: Family Medicine | Admitting: Family Medicine

## 2011-10-31 DIAGNOSIS — N63 Unspecified lump in unspecified breast: Secondary | ICD-10-CM

## 2011-11-08 ENCOUNTER — Ambulatory Visit: Payer: BC Managed Care – PPO | Admitting: Physician Assistant

## 2011-11-23 ENCOUNTER — Ambulatory Visit (INDEPENDENT_AMBULATORY_CARE_PROVIDER_SITE_OTHER): Payer: BC Managed Care – PPO | Admitting: Advanced Practice Midwife

## 2011-11-23 ENCOUNTER — Encounter: Payer: Self-pay | Admitting: Obstetrics & Gynecology

## 2011-11-23 VITALS — BP 138/79 | HR 86 | Temp 97.5°F | Resp 12 | Ht 68.0 in | Wt 225.9 lb

## 2011-11-23 DIAGNOSIS — Z01419 Encounter for gynecological examination (general) (routine) without abnormal findings: Secondary | ICD-10-CM

## 2011-11-23 MED ORDER — NYSTATIN 100000 UNIT/GM EX CREA: TOPICAL_CREAM | Freq: Two times a day (BID) | CUTANEOUS | Status: DC

## 2011-11-23 NOTE — Patient Instructions (Signed)
Yeast Infection of the Skin Some yeast on the skin is normal, but sometimes it causes an infection. If you have a yeast infection, it shows up as white or light brown patches on brown skin. You can see it better in the summer on tan skin. It causes light-colored holes in your suntan. It can happen on any area of the body. This cannot be passed from person to person. HOME CARE  Scrub your skin daily with a dandruff shampoo. Your rash may take a couple weeks to get well.   Do not scratch or itch the rash.  GET HELP RIGHT AWAY IF:   You get another infection from scratching. The skin may get warm, red, and may ooze fluid.   The infection does not seem to be getting better.  MAKE SURE YOU:  Understand these instructions.   Will watch your condition.   Will get help right away if you are not doing well or get worse.  Document Released: 03/30/2008 Document Revised: 04/06/2011 Document Reviewed: 03/30/2008 [facility] Patient Information 2012 [place], [place].Menopause Menopause is the normal time of life when menstrual periods stop completely. Menopause is complete when you have missed 12 consecutive menstrual periods. It usually occurs between the ages of 32 to 60, with an average age of 75. Very rarely does a woman develop menopause before 50 years old. At menopause, your ovaries stop producing the female hormones, estrogen and progesterone. This can cause undesirable symptoms and also affect your health. Sometimes the symptoms may occur 4 to 5 years before the menopause begins. There is no relationship between menopause and:  Oral contraceptives.   Number of children you had.   Race.   The age your menstrual periods started (menarche).  Heavy smokers and very thin women may develop menopause earlier in life. CAUSES  The ovaries stop producing the female hormones estrogen and progesterone.   Other causes include:   Surgery to remove both ovaries.   The ovaries stop functioning for no  known reason.   Tumors of the pituitary gland in the brain.   Medical disease that affects the ovaries and hormone production.   Radiation treatment to the abdomen or pelvis.   Chemotherapy that affects the ovaries.  SYMPTOMS   Hot flashes.   Night sweats.   Decrease in sex drive.   Vaginal dryness and thinning of the vagina causing painful intercourse.   Dryness of the skin and developing wrinkles.   Headaches.   Tiredness.   Irritability.   Memory problems.   Weight gain.   Bladder infections.   Hair growth of the face and chest.   Infertility.  More serious symptoms include:  Loss of bone (osteoporosis) causing breaks (fractures).   Depression.   Hardening and narrowing of the arteries (atherosclerosis) causing heart attacks and strokes.  DIAGNOSIS   When the menstrual periods have stopped for 12 straight months.   Physical exam.   Hormone studies of the blood.  TREATMENT  There are many treatment choices and nearly as many questions about them. The decisions to treat or not to treat menopausal changes is an individual choice made with your caregiver. Your caregiver can discuss the treatments with you. Together, you can decide which treatment will work best for you. Your treatment choices may include:   Hormone therapy (estorgen and progesterone).   Non-hormonal medications.   Treating the individual symptoms with medication (for example antidepressants for depression).   Herbal medications that may help specific symptoms.   Counseling by  a psychiatrist or psychologist.   Group therapy.   Lifestyle changes including:   Eating healthy.   Regular exercise.   Limiting caffeine and alcohol.   Stress management and meditation.   No treatment.  HOME CARE INSTRUCTIONS   Take the medication your caregiver gives you as directed.   Get plenty of sleep and rest.   Exercise regularly.   Eat a diet that contains calcium (good for the bones)  and soy products (acts like estrogen hormone).   Avoid alcoholic beverages.   Do not smoke.   If you have hot flashes, dress in layers.   Take supplements, calcium and vitamin D to strengthen bones.   You can use over-the-counter lubricants or moisturizers for vaginal dryness.   Group therapy is sometimes very helpful.   Acupuncture may be helpful in some cases.  SEEK MEDICAL CARE IF:   You are not sure you are in menopause.   You are having menopausal symptoms and need advice and treatment.   You are still having menstrual periods after age 101.   You have pain with intercourse.   Menopause is complete (no menstrual period for 12 months) and you develop vaginal bleeding.   You need a referral to a specialist (gynecologist, psychiatrist or psychologist) for treatment.  SEEK IMMEDIATE MEDICAL CARE IF:   You have severe depression.   You have excessive vaginal bleeding.   You fell and think you have a broken bone.   You have pain when you urinate.   You develop leg or chest pain.   You have a fast pounding heart beat (palpitations).   You have severe headaches.   You develop vision problems.   You feel a lump in your breast.   You have abdominal pain or severe indigestion.  Document Released: 07/08/2003 Document Revised: 04/06/2011 Document Reviewed: 02/13/2008 [facility] Patient Information 2012 [place], [place].Health Maintenance, Females A healthy lifestyle and preventative care can promote health and wellness.  Maintain regular health, dental, and eye exams.   Eat a healthy diet. Foods like vegetables, fruits, whole grains, low-fat dairy products, and lean protein foods contain the nutrients you need without too many calories. Decrease your intake of foods high in solid fats, added sugars, and salt. Get information about a proper diet from your caregiver, if necessary.   Regular physical exercise is one of the most important things you can do for your health.  Most adults should get at least 150 minutes of moderate-intensity exercise (any activity that increases your heart rate and causes you to sweat) each week. In addition, most adults need muscle-strengthening exercises on 2 or more days a week.    Maintain a healthy weight. The body mass index (BMI) is a screening tool to identify possible weight problems. It provides an estimate of body fat based on height and weight. Your caregiver can help determine your BMI, and can help you achieve or maintain a healthy weight. For adults 20 years and older:   A BMI below 18.5 is considered underweight.   A BMI of 18.5 to 24.9 is normal.   A BMI of 25 to 29.9 is considered overweight.   A BMI of 30 and above is considered obese.   Maintain normal blood lipids and cholesterol by exercising and minimizing your intake of saturated fat. Eat a balanced diet with plenty of fruits and vegetables. Blood tests for lipids and cholesterol should begin at age 29 and be repeated every 5 years. If your lipid or cholesterol levels  are high, you are over 50, or you are a high risk for heart disease, you may need your cholesterol levels checked more frequently.Ongoing high lipid and cholesterol levels should be treated with medicines if diet and exercise are not effective.   If you smoke, find out from your caregiver how to quit. If you do not use tobacco, do not start.   If you are pregnant, do not drink alcohol. If you are breastfeeding, be very cautious about drinking alcohol. If you are not pregnant and choose to drink alcohol, do not exceed 1 drink per day. One drink is considered to be 12 ounces (355 mL) of beer, 5 ounces (148 mL) of wine, or 1.5 ounces (44 mL) of liquor.   Avoid use of street drugs. Do not share needles with anyone. Ask for help if you need support or instructions about stopping the use of drugs.   High blood pressure causes heart disease and increases the risk of stroke. Blood pressure should be  checked at least every 1 to 2 years. Ongoing high blood pressure should be treated with medicines, if weight loss and exercise are not effective.   If you are 106 to 50 years old, ask your caregiver if you should take aspirin to prevent strokes.   Diabetes screening involves taking a blood sample to check your fasting blood sugar level. This should be done once every 3 years, after age 19, if you are within normal weight and without risk factors for diabetes. Testing should be considered at a younger age or be carried out more frequently if you are overweight and have at least 1 risk factor for diabetes.   Breast cancer screening is essential preventative care for women. You should practice "breast self-awareness." This means understanding the normal appearance and feel of your breasts and may include breast self-examination. Any changes detected, no matter how small, should be reported to a caregiver. Women in their 58s and 30s should have a clinical breast exam (CBE) by a caregiver as part of a regular health exam every 1 to 3 years. After age 53, women should have a CBE every year. Starting at age 30, women should consider having a mammogram (breast X-ray) every year. Women who have a family history of breast cancer should talk to their caregiver about genetic screening. Women at a high risk of breast cancer should talk to their caregiver about having an MRI and a mammogram every year.   The Pap test is a screening test for cervical cancer. Women should have a Pap test starting at age 38. Between ages 12 and 33, Pap tests should be repeated every 2 years. Beginning at age 74, you should have a Pap test every 3 years as long as the past 3 Pap tests have been normal. If you had a hysterectomy for a problem that was not cancer or a condition that could lead to cancer, then you no longer need Pap tests. If you are between ages 89 and 66, and you have had normal Pap tests going back 10 years, you no longer need  Pap tests. If you have had past treatment for cervical cancer or a condition that could lead to cancer, you need Pap tests and screening for cancer for at least 20 years after your treatment. If Pap tests have been discontinued, risk factors (such as a new sexual partner) need to be reassessed to determine if screening should be resumed. Some women have medical problems that increase the chance of  getting cervical cancer. In these cases, your caregiver may recommend more frequent screening and Pap tests.   The human papillomavirus (HPV) test is an additional test that may be used for cervical cancer screening. The HPV test looks for the virus that can cause the cell changes on the cervix. The cells collected during the Pap test can be tested for HPV. The HPV test could be used to screen women aged 62 years and older, and should be used in women of any age who have unclear Pap test results. After the age of 52, women should have HPV testing at the same frequency as a Pap test.   Colorectal cancer can be detected and often prevented. Most routine colorectal cancer screening begins at the age of 71 and continues through age 39. However, your caregiver may recommend screening at an earlier age if you have risk factors for colon cancer. On a yearly basis, your caregiver may provide home test kits to check for hidden blood in the stool. Use of a small camera at the end of a tube, to directly examine the colon (sigmoidoscopy or colonoscopy), can detect the earliest forms of colorectal cancer. Talk to your caregiver about this at age 21, when routine screening begins. Direct examination of the colon should be repeated every 5 to 10 years through age 71, unless early forms of pre-cancerous polyps or small growths are found.   Hepatitis C blood testing is recommended for all people born from [birth year] through 1964 and any individual with known risks for hepatitis C.   Practice safe sex. Use condoms and avoid high-risk  sexual practices to reduce the spread of sexually transmitted infections (STIs). Sexually active women aged 73 and younger should be checked for Chlamydia, which is a common sexually transmitted infection. Older women with new or multiple partners should also be tested for Chlamydia. Testing for other STIs is recommended if you are sexually active and at increased risk.   Osteoporosis is a disease in which the bones lose minerals and strength with aging. This can result in serious bone fractures. The risk of osteoporosis can be identified using a bone density scan. Women ages 70 and over and women at risk for fractures or osteoporosis should discuss screening with their caregivers. Ask your caregiver whether you should be taking a calcium supplement or vitamin D to reduce the rate of osteoporosis.   Menopause can be associated with physical symptoms and risks. Hormone replacement therapy is available to decrease symptoms and risks. You should talk to your caregiver about whether hormone replacement therapy is right for you.   Use sunscreen with a sun protection factor (SPF) of 30 or greater. Apply sunscreen liberally and repeatedly throughout the day. You should seek shade when your shadow is shorter than you. Protect yourself by wearing long sleeves, pants, a wide-brimmed hat, and sunglasses year round, whenever you are outdoors.   Notify your caregiver of new moles or changes in moles, especially if there is a change in shape or color. Also notify your caregiver if a mole is larger than the size of a pencil eraser.   Stay current with your immunizations.  Document Released: 10/31/2010 Document Revised: 04/06/2011 Document Reviewed: 10/31/2010 [facility] Patient Information 2012 [place], [place].

## 2011-11-23 NOTE — Progress Notes (Signed)
  Subjective:     Kathleen Mcfarland is a 51 y.o. female here for a routine exam.  Current complaints: itchy skin rashes under breasts and in leg/groin creases.  Personal health questionnaire reviewed: no.  Has had several orthopedic surgeries. Is followed by medical doctor for other health concerns. Had an axillary node which she went to breast center for and they told her all exams were normal. No other masses.  Still has periods. Mother was 58 at menopause. Starting to have some hot flashes.    Gynecologic History Patient's last menstrual period was 11/13/2011. Contraception: none Last Pap: 2011. Results were: normal Last mammogram: this year. Results were: normal  Obstetric History OB History    Grav Para Term Preterm Abortions TAB SAB Ect Mult Living                   The following portions of the patient's history were reviewed and updated as appropriate: allergies, current medications, past family history, past medical history, past social history, past surgical history and problem list.  Review of Systems Pertinent items are noted in HPI.    Objective:    BP 138/79  Pulse 86  Temp 97.5 F (36.4 C) (Oral)  Resp 12  Ht 5\' 8"  (1.727 m)  Wt 225 lb 14.4 oz (102.468 kg)  BMI 34.35 kg/m2  LMP 11/13/2011  General Appearance:    Alert, cooperative, no distress, appears stated age  Head:    Normocephalic, without obvious abnormality, atraumatic  Eyes:      Ears:      Nose:    Throat:     Neck:   Supple, symmetrical, trachea midline, no adenopathy;    thyroid:  no enlargement/tenderness/nodules; no carotid   bruit or JVD  Back:     Symmetric, no curvature, ROM normal, no CVA tenderness  Lungs:     Clear to auscultation bilaterally, respirations unlabored  Chest Wall:    No tenderness or deformity   Heart:    Regular rate and rhythm, S1 and S2 normal, no murmur, rub   or gallop  Breast Exam:    No tenderness, masses, or nipple abnormality  Abdomen:     Soft, non-tender,  bowel sounds active all four quadrants,    no masses, no organomegaly  Genitalia:    Normal female without lesion, discharge or tenderness  Rectal:    Normal tone, no masses or tenderness;    Extremities:   Extremities normal, atraumatic, no cyanosis or edema  Pulses:   2+ and symmetric all extremities  Skin:   Skin color, texture, turgor normal, no lesions.  There are small areas of candida with erethema (mild) and loss of melanin under each breast and groin folds.  Lymph nodes:   Cervical, supraclavicular, and axillary nodes normal  Neurologic:   normal strength, sensation and reflexes          Assessment:    Healthy female exam.   Intertrigo/candidiasis of skin  Plan:    Follow up in: 1 year.   Rx Mycostatin cream bid

## 2011-11-27 ENCOUNTER — Other Ambulatory Visit: Payer: Self-pay | Admitting: Internal Medicine

## 2011-11-27 NOTE — Telephone Encounter (Signed)
Refill done.  

## 2011-11-29 ENCOUNTER — Encounter: Payer: Self-pay | Admitting: *Deleted

## 2011-12-05 ENCOUNTER — Telehealth: Payer: Self-pay | Admitting: Medical

## 2011-12-05 NOTE — Telephone Encounter (Signed)
Pt called requesting a different Rx for Psoriasis. Said she was seen within 2-3 weeks by a CNM in our office and given an Rx that was too expensive and was not covered by her insurance. She feels that her condition has worsened and she requires something for her psoriasis. She is at work, but would like [place] to leave a message on her VM when we have an answer for her.   With review of the pt's chart. Pt saw Wynelle Bourgeois and was given a Rx for Nystatin cream for candidiasis not psoriasis.

## 2011-12-06 ENCOUNTER — Other Ambulatory Visit: Payer: Self-pay | Admitting: Internal Medicine

## 2011-12-06 NOTE — Telephone Encounter (Signed)
Refill done.  

## 2011-12-06 NOTE — Telephone Encounter (Signed)
Called pt and left message to return the call to the clinics.  

## 2011-12-11 MED ORDER — NYSTATIN 100000 UNIT/GM EX CREA: TOPICAL_CREAM | Freq: Two times a day (BID) | CUTANEOUS | Status: DC

## 2011-12-11 NOTE — Telephone Encounter (Signed)
I called and spoke w/pharmacy tech @ [facility]. She stated that the nystatin cream is $4.  I called pt and informed her of the Rx price. She states that she had been told $33. She no longer has the printed Rx that had been given to her. I told pt that I would send the Rx electronically.  She should call back if she has any further problems with the Rx . Pt voiced understanding.

## 2012-01-09 ENCOUNTER — Other Ambulatory Visit: Payer: Self-pay | Admitting: Internal Medicine

## 2012-01-10 NOTE — Telephone Encounter (Signed)
Refill done.  

## 2012-01-27 ENCOUNTER — Other Ambulatory Visit: Payer: Self-pay | Admitting: Internal Medicine

## 2012-01-29 NOTE — Telephone Encounter (Signed)
Need okay.   MW  

## 2012-01-29 NOTE — Telephone Encounter (Signed)
Advise patient, I sent a prescription to [facility] but she is due for a checkup. Please arrange

## 2012-01-29 NOTE — Telephone Encounter (Signed)
Left detailed msg to have pt return call & scheduled appt.

## 2012-02-09 ENCOUNTER — Other Ambulatory Visit: Payer: Self-pay | Admitting: Internal Medicine

## 2012-02-09 NOTE — Telephone Encounter (Signed)
lmovm for pt to return call. Pt has not been seen within a year & is due for a CPE.

## 2012-05-28 ENCOUNTER — Other Ambulatory Visit: Payer: Self-pay | Admitting: Internal Medicine

## 2012-05-28 NOTE — Telephone Encounter (Signed)
lmovm for pt to return call.  

## 2012-05-28 NOTE — Telephone Encounter (Signed)
Okay to  Call #[phone] and one refill. Schedule a physical exam within 6 weeks. Please facilitate the appointment, if needed , put together two 15 minute appointment. No further refills w/o OV

## 2012-05-28 NOTE — Telephone Encounter (Signed)
Pt has not been seen within a year. OK to refill? 

## 2012-06-04 NOTE — Telephone Encounter (Signed)
Refill done.  

## 2012-06-06 ENCOUNTER — Other Ambulatory Visit: Payer: Self-pay | Admitting: Family Medicine

## 2012-06-06 DIAGNOSIS — M549 Dorsalgia, unspecified: Secondary | ICD-10-CM

## 2012-06-07 ENCOUNTER — Inpatient Hospital Stay: Admission: RE | Admit: 2012-06-07 | Payer: BC Managed Care – PPO | Source: Ambulatory Visit

## 2012-06-10 ENCOUNTER — Other Ambulatory Visit: Payer: Self-pay

## 2012-06-10 ENCOUNTER — Encounter (HOSPITAL_BASED_OUTPATIENT_CLINIC_OR_DEPARTMENT_OTHER)
Admission: RE | Admit: 2012-06-10 | Discharge: 2012-06-10 | Disposition: A | Discharge status: Home / Self Care | Payer: BC Managed Care – PPO | Source: Ambulatory Visit | Attending: Orthopedic Surgery | Admitting: Orthopedic Surgery

## 2012-06-10 ENCOUNTER — Encounter (HOSPITAL_BASED_OUTPATIENT_CLINIC_OR_DEPARTMENT_OTHER): Payer: Self-pay | Admitting: *Deleted

## 2012-06-10 DIAGNOSIS — Z01812 Encounter for preprocedural laboratory examination: Secondary | ICD-10-CM | POA: Insufficient documentation

## 2012-06-10 DIAGNOSIS — Z01818 Encounter for other preprocedural examination: Secondary | ICD-10-CM | POA: Insufficient documentation

## 2012-06-10 DIAGNOSIS — Z01810 Encounter for preprocedural cardiovascular examination: Secondary | ICD-10-CM | POA: Insufficient documentation

## 2012-06-10 LAB — BASIC METABOLIC PANEL
BUN: 13 mg/dL (ref 6–23)
CO2: 26 mEq/L (ref 19–32)
Calcium: 9.7 mg/dL (ref 8.4–10.5)
Creatinine, Ser: 0.67 mg/dL (ref 0.50–1.10)
GFR calc non Af Amer: >90 mL/min (ref >90)
Glucose, Bld: 122 mg/dL — ABNORMAL HIGH (ref 70–99)
Sodium: 137 mEq/L (ref 135–145)

## 2012-06-10 NOTE — Progress Notes (Signed)
Pt has been here x2 shoulder-last 12/12-did not stay-will stay this time to bring all meds and overnight bag-to come in for bmet-ekg

## 2012-06-11 NOTE — H&P (Signed)
  [facility] [address]   [address] [place], [place] [address] [phone] A Division of [facility]  Loreta Ave, M.D.   Robert A. Thurston Hole, M.D.   Burnell Blanks, M.D.   Eulas Post, M.D.   Lunette Stands, M.D Buford Dresser, M.D.  Charlsie Quest, M.D.   Estell Harpin, M.D.   Melina Fiddler, M.D. Genene Churn. Barry Dienes, PA-C            Kirstin A. Shepperson, PA-C Josh [place], PA-C [place], [place]  RE: Kathleen Mcfarland, Kathleen Mcfarland   [phone]      DOB: 12-11-1960 PROGRESS NOTE: 04-12-12 [name] comes in earlier than scheduled because she's getting increasing symptoms in her left shoulder using it more because of her dilemma with her right shoulder. We're planning on addressing her right shoulder rotator cuff in the near future. The left insidious onset. Some sharp pain when she's lying down and tries to go over head. Typical symptoms of impingement. No instability. No radicular symptoms.   EXAMINATION: Rotator cuff weakness irritation on the right but good motion. On the left I can get her through full motion. Positive impingement positive palms down abduction. No atrophy. Biceps intact. No apprehension or instability. She's neurovascularly intact distally.  X-RAYS: 3 view x-rays show type II acromion marked degenerative changes with spurring AC joint. A little calcification at the rotator cuff attachment. Reasonable subacromial space good glenohumeral position no degenerative changes there.  IMPRESSION: Impingement possible calcific tendonitis left shoulder.  DISPOSITION: We're going to try subacromial injection and Jobe exercise program. I also want to get an MRI to see whether she has a tear. I think she's symptomatic enough to warrant that. She understands and agrees.  PROCEDURE NOTE: The patient's clinical condition is marked by substantial pain and/or significant functional disability. Other conservative therapy  has not provided relief, is contraindicated, or not appropriate. There is a reasonable likelihood that injection will significantly improve the patient's pain and/or functional disability.   Patient is seated on the exam table, the posterior shoulder is prepped with Betadine and alcohol and injected into the subacromial interval with Depo-Medrol/Marcaine.  Patient tolerates the procedure without difficulty.  Loreta Ave, M.D.

## 2012-07-02 NOTE — Progress Notes (Signed)
Surgery was r/s due to snow-had ekg and bmet done-bring all meds and overnight bag-arrive 615am

## 2012-07-03 NOTE — H&P (Signed)
[facility] [address]   [address] [place], [place] [address] [phone] A Division of [facility]  Loreta Ave, M.D.   Robert A. Thurston Hole, M.D.   Burnell Blanks, M.D.   Eulas Post, M.D.   Lunette Stands, M.D Buford Dresser, M.D.  Charlsie Quest, M.D.   Estell Harpin, M.D.   Melina Fiddler, M.D. Genene Churn. Barry Dienes, PA-C            Kirstin A. Shepperson, PA-C Josh [place], PA-C [place], [place]   RE: Kathleen Mcfarland, Kathleen Mcfarland                                [phone]      DOB: [birth year] PROGRESS NOTE: 03-12-12 52 year-old black female who comes into the office today with complaints of right shoulder pain.  She is status post right shoulder scope with debridement, subacromial decompression, DCE and arthroscopically assisted rotator cuff repair on April 13, 2011.  States that shoulder was doing well up until about a week ago when she began having some soreness with overhead activity and reaching behind her back.  No injury.  The last couple of days pain has become worse.  Some swelling in her right hand yesterday, but this has resolved.  Pain does awaken her at night when she lies on her right shoulder.  No cervical spine or radicular component.    EXAMINATION: Pleasant black female, alert and oriented x 3 and in no acute distress.  No increase in respiratory effort.  Cervical spine unremarkable.  Right shoulder she has about 110 degrees active flexion/abduction with marked discomfort.  Passively I can get her to about 150-160 degrees.  Some limitation with internal rotation behind her back.  Negative drop arm.  Pain and cuff weakness with supraspinatus resistance.  Neurovascularly intact.  Skin warm and dry.     X-RAYS: Right shoulder, AP, outlet and axillary views, show some spurring at the greater tuberosity.  No acute changes.    DISPOSITION:  Patient advised that pain is likely related to rotator cuff  tendonitis.  We will attempt conservative treatment with injection.  She will follow up in the office in 3-4 weeks for recheck, but if she is doing well she may cancel the appointment.  If she continues to be symptomatic we did discuss scheduling an MR arthrogram to rule out rotator cuff injury.  After sitting in the office for about 5-10 minutes she did have fairly good improvement with Marcaine in place.    PROCEDURE NOTE: The patient's clinical condition is marked by substantial pain and/or significant functional disability.  Other conservative therapy has not provided relief, is contraindicated, or not appropriate.  There is a reasonable likelihood that injection will significantly improve the patient's pain and/or functional disability. After patient consent the right shoulder was prepped with Betadine after using 3 cc of 1% Xylocaine for local anesthetic, subacromial 1:5 Depo-Medrol/Marcaine injection performed from a posterolateral approach.  Tolerated procedure well without complication.   Loreta Ave, M.D.  [facility] [address]   [address] [place], [place] [address] [phone] A Division of [facility]  Loreta Ave, M.D.   Robert A. Thurston Hole, M.D.   Burnell Blanks, M.D.   Eulas Post, M.D.   Lunette Stands, M.D Buford Dresser, M.D.  Charlsie Quest, M.D.   Fayrene Fearing  S. Farris Has, M.D.   Melina Fiddler, M.D. Genene Churn. Barry Dienes, PA-C            Kirstin A. Shepperson, PA-C Josh [place], PA-C [place], [place]  RE: Kathleen Mcfarland, Kathleen Mcfarland   [phone]      DOB: Aug 24, 1960 PROGRESS NOTE: 04-05-12 Kathleen Mcfarland comes in for follow-up. Recent MRI complete. She ha a new small oblique tear supraspinatus tendon adjacent to what was repaired in the past. This occurred without a dramatic traumatic event. Unfortunately symptoms are intolerable with rest pain and night pain and she can't sleep. Cortisone injection in November only  transiently helpful. I went over her MRI report and scan and reviewed it with her. This is a small tear that we don't have to address acutely if she can tolerate things. That's not the case unfortunately. She'd like to proceed with definitive treatment. She's already had a decompression. Plan is going to be exam under anesthesia arthroscopy assess previous repair and repair any additional tears at the time. Went over this with her and paperwork complete. All questions answered. She's given a prescription for pain medicine to use sparingly until surgery. More than 15 minutes spent face-to-face covering this with her.  Loreta Ave, M.D.  Electronically verified by Loreta Ave, M.D. DFM:kh D 04-05-12 T 04-08-12

## 2012-07-04 ENCOUNTER — Ambulatory Visit (HOSPITAL_BASED_OUTPATIENT_CLINIC_OR_DEPARTMENT_OTHER): Payer: BC Managed Care – PPO | Admitting: Anesthesiology

## 2012-07-04 ENCOUNTER — Encounter (HOSPITAL_BASED_OUTPATIENT_CLINIC_OR_DEPARTMENT_OTHER): Payer: Self-pay | Admitting: Anesthesiology

## 2012-07-04 ENCOUNTER — Ambulatory Visit (HOSPITAL_BASED_OUTPATIENT_CLINIC_OR_DEPARTMENT_OTHER)
Admission: RE | Admit: 2012-07-04 | Discharge: 2012-07-04 | Disposition: A | Discharge status: Home / Self Care | Payer: BC Managed Care – PPO | Source: Ambulatory Visit | Attending: Orthopedic Surgery | Admitting: Orthopedic Surgery

## 2012-07-04 ENCOUNTER — Encounter (HOSPITAL_BASED_OUTPATIENT_CLINIC_OR_DEPARTMENT_OTHER)
Admission: RE | Disposition: A | Discharge status: Home / Self Care | Payer: Self-pay | Source: Ambulatory Visit | Attending: Orthopedic Surgery

## 2012-07-04 DIAGNOSIS — M24619 Ankylosis, unspecified shoulder: Secondary | ICD-10-CM | POA: Insufficient documentation

## 2012-07-04 DIAGNOSIS — M67919 Unspecified disorder of synovium and tendon, unspecified shoulder: Secondary | ICD-10-CM | POA: Insufficient documentation

## 2012-07-04 DIAGNOSIS — M719 Bursopathy, unspecified: Secondary | ICD-10-CM | POA: Insufficient documentation

## 2012-07-04 HISTORY — DX: Other complications of anesthesia, initial encounter: T88.59XA

## 2012-07-04 HISTORY — DX: Dorsalgia, unspecified: M54.9

## 2012-07-04 HISTORY — DX: Adverse effect of unspecified anesthetic, initial encounter: T41.45XA

## 2012-07-04 HISTORY — PX: SHOULDER ARTHROSCOPY WITH ROTATOR CUFF REPAIR: SHX5685

## 2012-07-04 SURGERY — ARTHROSCOPY, SHOULDER, WITH ROTATOR CUFF REPAIR
Anesthesia: Regional | Site: Shoulder | Laterality: Right | Wound class: Clean

## 2012-07-04 MED ORDER — OXYCODONE-ACETAMINOPHEN 7.5-325 MG PO TABS: 1.0000 | ORAL_TABLET | Freq: Four times a day (QID) | ORAL | Status: DC | PRN

## 2012-07-04 MED ORDER — SODIUM CHLORIDE 0.9 % IR SOLN
Status: DC | PRN
  Administered 2012-07-04: 8000 mL

## 2012-07-04 MED ORDER — LIDOCAINE HCL (CARDIAC) 20 MG/ML IV SOLN
INTRAVENOUS | Status: DC | PRN
  Administered 2012-07-04: 40 mg via INTRAVENOUS

## 2012-07-04 MED ORDER — CEFAZOLIN SODIUM-DEXTROSE 2-3 GM-% IV SOLR
2.0000 g | INTRAVENOUS | Status: AC
  Administered 2012-07-04: 2 g via INTRAVENOUS

## 2012-07-04 MED ORDER — FENTANYL CITRATE 0.05 MG/ML IJ SOLN
50.0000 ug | INTRAMUSCULAR | Status: DC | PRN
  Administered 2012-07-04: 100 ug via INTRAVENOUS

## 2012-07-04 MED ORDER — OXYCODONE HCL 5 MG/5ML PO SOLN: 5.0000 mg | Freq: Once | ORAL | Status: DC | PRN

## 2012-07-04 MED ORDER — ROPIVACAINE HCL 5 MG/ML IJ SOLN
INTRAMUSCULAR | Status: DC | PRN
  Administered 2012-07-04: 30 mL via EPIDURAL

## 2012-07-04 MED ORDER — PROPOFOL 10 MG/ML IV BOLUS
INTRAVENOUS | Status: DC | PRN
  Administered 2012-07-04: 200 mg via INTRAVENOUS

## 2012-07-04 MED ORDER — DEXAMETHASONE SODIUM PHOSPHATE 4 MG/ML IJ SOLN
INTRAMUSCULAR | Status: DC | PRN
  Administered 2012-07-04: 10 mg via INTRAVENOUS

## 2012-07-04 MED ORDER — SUCCINYLCHOLINE CHLORIDE 20 MG/ML IJ SOLN
INTRAMUSCULAR | Status: DC | PRN
  Administered 2012-07-04: 100 mg via INTRAVENOUS

## 2012-07-04 MED ORDER — LABETALOL HCL 5 MG/ML IV SOLN
INTRAVENOUS | Status: DC | PRN
  Administered 2012-07-04: 5 mg via INTRAVENOUS

## 2012-07-04 MED ORDER — OXYCODONE HCL 5 MG PO TABS: 5.0000 mg | ORAL_TABLET | Freq: Once | ORAL | Status: DC | PRN

## 2012-07-04 MED ORDER — HYDROMORPHONE HCL PF 1 MG/ML IJ SOLN
0.2500 mg | INTRAMUSCULAR | Status: DC | PRN
  Administered 2012-07-04 (×2): 0.5 mg via INTRAVENOUS

## 2012-07-04 MED ORDER — LACTATED RINGERS IV SOLN
INTRAVENOUS | Status: DC
  Administered 2012-07-04: 10 mL/h via INTRAVENOUS
  Administered 2012-07-04: 08:00:00 via INTRAVENOUS

## 2012-07-04 MED ORDER — FENTANYL CITRATE 0.05 MG/ML IJ SOLN
INTRAMUSCULAR | Status: DC | PRN
  Administered 2012-07-04: 25 ug via INTRAVENOUS

## 2012-07-04 MED ORDER — ONDANSETRON HCL 4 MG/2ML IJ SOLN
INTRAMUSCULAR | Status: DC | PRN
  Administered 2012-07-04: 4 mg via INTRAVENOUS

## 2012-07-04 MED ORDER — MIDAZOLAM HCL 2 MG/2ML IJ SOLN
1.0000 mg | INTRAMUSCULAR | Status: DC | PRN
  Administered 2012-07-04: 2 mg via INTRAVENOUS

## 2012-07-04 MED ORDER — ACETAMINOPHEN 10 MG/ML IV SOLN
1000.0000 mg | Freq: Once | INTRAVENOUS | Status: AC
  Administered 2012-07-04: 1000 mg via INTRAVENOUS

## 2012-07-04 SURGICAL SUPPLY — 78 items
ANCH SUT SWLK 19.1X5.5 CLS EL (Anchor) ×2 IMPLANT
ANCHOR PEEK SWIVEL LOCK 5.5 (Anchor) ×2 IMPLANT
APL SKNCLS STERI-STRIP NONHPOA (GAUZE/BANDAGES/DRESSINGS)
BENZOIN TINCTURE PRP APPL 2/3 (GAUZE/BANDAGES/DRESSINGS) IMPLANT
BLADE CUTTER GATOR 3.5 (BLADE) ×2 IMPLANT
BLADE CUTTER MENIS 5.5 (BLADE) IMPLANT
BLADE GREAT WHITE 4.2 (BLADE) ×2 IMPLANT
BLADE SURG 15 STRL LF DISP TIS (BLADE) IMPLANT
BLADE SURG 15 STRL SS (BLADE)
BUR OVAL 6.0 (BURR) ×2 IMPLANT
CANISTER OMNI JUG 16 LITER (MISCELLANEOUS) ×2 IMPLANT
CANISTER SUCTION 2500CC (MISCELLANEOUS) IMPLANT
CANNULA DRY DOC 8X75 (CANNULA) ×1 IMPLANT
CANNULA TWIST IN 8.25X7CM (CANNULA) IMPLANT
CLOTH BEACON ORANGE TIMEOUT ST (SAFETY) ×2 IMPLANT
DECANTER SPIKE VIAL GLASS SM (MISCELLANEOUS) IMPLANT
DRAPE OEC MINIVIEW 54X84 (DRAPES) IMPLANT
DRAPE STERI 35X30 U-POUCH (DRAPES) ×2 IMPLANT
DRAPE U-SHAPE 47X51 STRL (DRAPES) ×2 IMPLANT
DRAPE U-SHAPE 76X120 STRL (DRAPES) ×4 IMPLANT
DRAPE UTILITY W/TAPE 26X15 (DRAPES) ×1 IMPLANT
DRSG PAD ABDOMINAL 8X10 ST (GAUZE/BANDAGES/DRESSINGS) ×2 IMPLANT
DURAPREP 26ML APPLICATOR (WOUND CARE) ×2 IMPLANT
ELECT MENISCUS 165MM 90D (ELECTRODE) ×2 IMPLANT
ELECT NDL TIP 2.8 STRL (NEEDLE) IMPLANT
ELECT NEEDLE TIP 2.8 STRL (NEEDLE) IMPLANT
ELECT REM PT RETURN 9FT ADLT (ELECTROSURGICAL) ×2
ELECTRODE REM PT RTRN 9FT ADLT (ELECTROSURGICAL) ×1 IMPLANT
GAUZE XEROFORM 1X8 LF (GAUZE/BANDAGES/DRESSINGS) ×2 IMPLANT
GLOVE BIO SURGEON STRL SZ 6.5 (GLOVE) ×2 IMPLANT
GLOVE BIOGEL PI IND STRL 7.0 (GLOVE) IMPLANT
GLOVE BIOGEL PI IND STRL 8 (GLOVE) ×1 IMPLANT
GLOVE BIOGEL PI INDICATOR 7.0 (GLOVE) ×1
GLOVE BIOGEL PI INDICATOR 8 (GLOVE) ×1
GLOVE EXAM NITRILE EXT CUFF MD (GLOVE) ×1 IMPLANT
GLOVE ORTHO TXT STRL SZ7.5 (GLOVE) ×4 IMPLANT
GOWN PREVENTION PLUS XLARGE (GOWN DISPOSABLE) ×2 IMPLANT
GOWN PREVENTION PLUS XXLARGE (GOWN DISPOSABLE) ×1 IMPLANT
GOWN STRL REIN 2XL XLG LVL4 (GOWN DISPOSABLE) ×2 IMPLANT
IMMOBILIZER SHOULDER XLGE (ORTHOPEDIC SUPPLIES) ×1 IMPLANT
IV NS IRRIG 3000ML ARTHROMATIC (IV SOLUTION) ×7 IMPLANT
NDL SCORPION MULTI FIRE (NEEDLE) IMPLANT
NDL SUT 6 .5 CRC .975X.05 MAYO (NEEDLE) IMPLANT
NEEDLE MAYO TAPER (NEEDLE)
NEEDLE SCORPION MULTI FIRE (NEEDLE) ×2 IMPLANT
NS IRRIG 1000ML POUR BTL (IV SOLUTION) IMPLANT
PACK ARTHROSCOPY DSU (CUSTOM PROCEDURE TRAY) ×2 IMPLANT
PACK BASIN DAY SURGERY FS (CUSTOM PROCEDURE TRAY) ×2 IMPLANT
PASSER SUT SWANSON 36MM LOOP (INSTRUMENTS) IMPLANT
PENCIL BUTTON HOLSTER BLD 10FT (ELECTRODE) ×2 IMPLANT
SET ARTHROSCOPY TUBING (MISCELLANEOUS) ×2
SET ARTHROSCOPY TUBING LN (MISCELLANEOUS) ×1 IMPLANT
SLEEVE SCD COMPRESS KNEE MED (MISCELLANEOUS) ×1 IMPLANT
SLING ARM FOAM STRAP LRG (SOFTGOODS) IMPLANT
SLING ARM FOAM STRAP MED (SOFTGOODS) IMPLANT
SLING ARM FOAM STRAP XLG (SOFTGOODS) IMPLANT
SLING ARM IMMOBILIZER LRG (SOFTGOODS) IMPLANT
SLING ARM IMMOBILIZER MED (SOFTGOODS) IMPLANT
SPONGE GAUZE 4X4 12PLY (GAUZE/BANDAGES/DRESSINGS) ×4 IMPLANT
SPONGE LAP 4X18 X RAY DECT (DISPOSABLE) IMPLANT
STRIP CLOSURE SKIN 1/2X4 (GAUZE/BANDAGES/DRESSINGS) IMPLANT
SUCTION FRAZIER TIP 10 FR DISP (SUCTIONS) IMPLANT
SUT ETHIBOND 2 OS 4 DA (SUTURE) IMPLANT
SUT ETHILON 2 0 FS 18 (SUTURE) IMPLANT
SUT ETHILON 3 0 PS 1 (SUTURE) ×1 IMPLANT
SUT FIBERWIRE #2 38 T-5 BLUE (SUTURE)
SUT RETRIEVER MED (INSTRUMENTS) IMPLANT
SUT TIGER TAPE 7 IN WHITE (SUTURE) ×1 IMPLANT
SUT VIC AB 0 CT1 27 (SUTURE)
SUT VIC AB 0 CT1 27XBRD ANBCTR (SUTURE) IMPLANT
SUT VIC AB 2-0 SH 27 (SUTURE)
SUT VIC AB 2-0 SH 27XBRD (SUTURE) IMPLANT
SUT VIC AB 3-0 FS2 27 (SUTURE) IMPLANT
SUTURE FIBERWR #2 38 T-5 BLUE (SUTURE) IMPLANT
TAPE FIBER 2MM 7IN #2 BLUE (SUTURE) ×1 IMPLANT
TOWEL OR 17X24 6PK STRL BLUE (TOWEL DISPOSABLE) ×2 IMPLANT
WATER STERILE IRR 1000ML POUR (IV SOLUTION) ×2 IMPLANT
YANKAUER SUCT BULB TIP NO VENT (SUCTIONS) IMPLANT

## 2012-07-04 NOTE — Interval H&P Note (Signed)
History and Physical Interval Note:  07/04/2012 7:30 AM  Kathleen Mcfarland  has presented today for surgery, with the diagnosis of RIGHT SHOULDER COMPLETE RUPTURE OF ROTATOR CUFF, IMPINGEMENT SYNDROME, DEGENERATIVE ARTHRITIS  The various methods of treatment have been discussed with the patient and family. After consideration of risks, benefits and other options for treatment, the patient has consented to  Procedure(s) with comments: SHOULDER ARTHROSCOPY WITH ROTATOR CUFF REPAIR (Right) - RIGHT SHOULDER ARTHROSCOPY WITH ROTATOR CUFF REPAIR, DISTAL CLAVICULECTOMY, SUBACROMIAL DECOMPRESSION, PARTIAL ACROMIOPLASTY WITH CORACOACROMIAL RELEASE as a surgical intervention .  The patient's history has been reviewed, patient examined, no change in status, stable for surgery.  I have reviewed the patient's chart and labs.  Questions were answered to the patient's satisfaction.     MURPHY,DANIEL F

## 2012-07-04 NOTE — Brief Op Note (Signed)
07/04/2012  9:45 AM  PATIENT:  [name]  52 y.o. female  PRE-OPERATIVE DIAGNOSIS:  RIGHT SHOULDER COMPLETE RUPTURE OF ROTATOR CUFF, IMPINGEMENT SYNDROME, DEGENERATIVE ARTHRITIS  POST-OPERATIVE DIAGNOSIS:  RIGHT SHOULDER COMPLETE RUPTURE OF ROTATOR CUFF, IMPINGEMENT SYNDROME, DEGENERATIVE ARTHRITIS  PROCEDURE:  Procedure(s) with comments: SHOULDER ARTHROSCOPY WITH ROTATOR CUFF REPAIR (Right) - RIGHT SHOULDER ARTHROSCOPY WITH ARTHROSCOPIC ROTATOR CUFF REPAIR, LYSIS OF ADHESIONS  SURGEON:  Surgeon(s) and Role:    * Loreta Ave, MD - Primary  PHYSICIAN ASSISTANT: Zonia Kief M   ANESTHESIA:   regional and general  EBL:  Total I/O In: 1700 [I.V.:1700] Out: -   SPECIMEN:  No Specimen  DISPOSITION OF SPECIMEN:  N/A  COUNTS:  YES  TOURNIQUET:  * No tourniquets in log *    PATIENT DISPOSITION:  PACU - hemodynamically stable.

## 2012-07-04 NOTE — Anesthesia Procedure Notes (Addendum)
Anesthesia Regional Block:  Interscalene brachial plexus block  Pre-Anesthetic Checklist: ,, timeout performed, Correct Patient, Correct Site, Correct Laterality, Correct Procedure, Correct Position, site marked, Risks and benefits discussed, pre-op evaluation,  At surgeon's request and post-op pain management  Laterality: Right  Prep: Maximum Sterile Barrier Precautions used and chloraprep       Needles:  Injection technique: Single-shot  Needle Type: Echogenic Stimulator Needle      Needle Gauge: 22 and 22 G    Additional Needles:  Procedures: ultrasound guided (picture in chart) and nerve stimulator Interscalene brachial plexus block  Nerve Stimulator or Paresthesia:  Response: Biceps response, 0.4 mA,   Additional Responses:   Narrative:  Start time: 07/04/2012 7:53 AM End time: 07/04/2012 8:10 AM Injection made incrementally with aspirations every 5 mL. Anesthesiologist: Sampson Goon, MD  Additional Notes: 2% Lidocaine skin wheel.   Interscalene brachial plexus block Procedure Name: Intubation Performed by: York Grice Pre-anesthesia Checklist: Patient identified, Timeout performed, Emergency Drugs available, Suction available and Patient being monitored Patient Re-evaluated:Patient Re-evaluated prior to inductionOxygen Delivery Method: Circle system utilized Preoxygenation: Pre-oxygenation with 100% oxygen Intubation Type: IV induction Ventilation: Mask ventilation without difficulty Laryngoscope Size: Miller and 2 Grade View: Grade I Tube type: Oral Tube size: 7.0 mm Number of attempts: 1 Airway Equipment and Method: Stylet Placement Confirmation: ETT inserted through vocal cords under direct vision,  breath sounds checked- equal and bilateral and positive ETCO2 Secured at: 22 cm Tube secured with: Tape Dental Injury: Teeth and Oropharynx as per pre-operative assessment

## 2012-07-04 NOTE — Progress Notes (Signed)
Assisted Dr. Fitzgerald with right, ultrasound guided, interscalene  block. Side rails up, monitors on throughout procedure. See vital signs in flow sheet. Tolerated Procedure well. 

## 2012-07-04 NOTE — Anesthesia Preprocedure Evaluation (Signed)
Anesthesia Evaluation  Patient identified by MRN, date of birth, ID band Patient awake    Reviewed: Allergy & Precautions, H&P , NPO status , Patient's Chart, lab work & pertinent test results  Airway Mallampati: II TM Distance: >3 FB Neck ROM: Full    Dental no notable dental hx. (+) Teeth Intact and Dental Advisory Given   Pulmonary neg pulmonary ROS,  breath sounds clear to auscultation  Pulmonary exam normal       Cardiovascular hypertension, On Medications Rhythm:Regular Rate:Normal     Neuro/Psych negative neurological ROS  negative psych ROS   GI/Hepatic negative GI ROS, Neg liver ROS,   Endo/Other  Hypothyroidism   Renal/GU negative Renal ROS  negative genitourinary   Musculoskeletal   Abdominal   Peds  Hematology negative hematology ROS (+)   Anesthesia Other Findings   Reproductive/Obstetrics negative OB ROS                           Anesthesia Physical Anesthesia Plan  ASA: II  Anesthesia Plan: General and Regional   Post-op Pain Management:    Induction: Intravenous  Airway Management Planned: Oral ETT  Additional Equipment:   Intra-op Plan:   Post-operative Plan: Extubation in OR  Informed Consent: I have reviewed the patients History and Physical, chart, labs and discussed the procedure including the risks, benefits and alternatives for the proposed anesthesia with the patient or authorized representative who has indicated his/her understanding and acceptance.   Dental advisory given  Plan Discussed with: CRNA  Anesthesia Plan Comments:         Anesthesia Quick Evaluation

## 2012-07-04 NOTE — Anesthesia Postprocedure Evaluation (Signed)
  Anesthesia Post-op Note  Patient: Kathleen Mcfarland  Procedure(s) Performed: Procedure(s) with comments: SHOULDER ARTHROSCOPY WITH ROTATOR CUFF REPAIR (Right) - RIGHT SHOULDER ARTHROSCOPY WITH ARTHROSCOPIC ROTATOR CUFF REPAIR, LYSIS OF ADHESIONS  Patient Location: PACU  Anesthesia Type:GA combined with regional for post-op pain  Level of Consciousness: awake and alert   Airway and Oxygen Therapy: Patient Spontanous Breathing and Patient connected to face mask oxygen  Post-op Pain: none  Post-op Assessment: Post-op Vital signs reviewed, Patient's Cardiovascular Status Stable, Respiratory Function Stable, Patent Airway and No signs of Nausea or vomiting  Post-op Vital Signs: Reviewed and stable  Complications: No apparent anesthesia complications

## 2012-07-04 NOTE — Interval H&P Note (Signed)
History and Physical Interval Note:  07/04/2012 7:28 AM  Kathleen Mcfarland  has presented today for surgery, with the diagnosis of RIGHT SHOULDER COMPLETE RUPTURE OF ROTATOR CUFF, IMPINGEMENT SYNDROME, DEGENERATIVE ARTHRITIS  The various methods of treatment have been discussed with the patient and family. After consideration of risks, benefits and other options for treatment, the patient has consented to  Procedure(s) with comments: SHOULDER ARTHROSCOPY WITH ROTATOR CUFF REPAIR (Right) - RIGHT SHOULDER ARTHROSCOPY WITH ROTATOR CUFF REPAIR, DISTAL CLAVICULECTOMY, SUBACROMIAL DECOMPRESSION, PARTIAL ACROMIOPLASTY WITH CORACOACROMIAL RELEASE as a surgical intervention .  The patient's history has been reviewed, patient examined, no change in status, stable for surgery.  I have reviewed the patient's chart and labs.  Questions were answered to the patient's satisfaction.     MURPHY,DANIEL F

## 2012-07-04 NOTE — Transfer of Care (Signed)
Immediate Anesthesia Transfer of Care Note  Patient: Kathleen Mcfarland  Procedure(s) Performed: Procedure(s) with comments: SHOULDER ARTHROSCOPY WITH ROTATOR CUFF REPAIR (Right) - RIGHT SHOULDER ARTHROSCOPY WITH ARTHROSCOPIC ROTATOR CUFF REPAIR, LYSIS OF ADHESIONS  Patient Location: PACU  Anesthesia Type:General  Level of Consciousness: awake, alert  and oriented  Airway & Oxygen Therapy: Patient Spontanous Breathing and Patient connected to face mask oxygen  Post-op Assessment: Report given to PACU RN and Post -op Vital signs reviewed and stable  Post vital signs: Reviewed and stable  Complications: No apparent anesthesia complications

## 2012-07-05 NOTE — Op Note (Signed)
Kathleen Mcfarland, BYE NO.:  [id number]  MEDICAL RECORD NO.:  [id number]  LOCATION:                                 FACILITY:  PHYSICIAN:  Loreta Ave, M.D. DATE OF BIRTH:  [birth year]  DATE OF PROCEDURE:  07/04/2012 DATE OF DISCHARGE:                              OPERATIVE REPORT   PREOPERATIVE DIAGNOSES:  Recurrent rotator cuff tear, right shoulder after previous repair x2.  Previous subacromial decompression, distal clavicle excision.  Previous micro fracturing of humeral head for grade 4 lesion with loose bodies.  POSTOPERATIVE DIAGNOSES:  Recurrent rotator cuff tear, right shoulder after previous repair x2.  Previous subacromial decompression, distal clavicle excision.  Previous micro fracturing of humeral head for grade 4 lesion with loose bodies.  Nice fibrocartilage ingrowth over area of micro fracturing.  No progression of arthritis or loose bodies. Recurrent full-thickness small tear supraspinatus tendon between the previous 2 sutures anchoring down the front and back of the cable. Adhesions above and below.  Adequate acromioplasty, distal clavicle excision, decompression.  PROCEDURE:  Right shoulder exam under anesthesia, arthroscopy. Assessment of the glenohumeral joint.  Lysis and debridement of adhesions, mobilization of rotator cuff with debridement and then rerepair with FiberWire suture x2, swivel lock anchor x2 throughout the tear in the crescent region of the supraspinatus tendon.  SURGEON:  Loreta Ave, MD.  ASSISTANT:  Genene Churn. Barry Dienes, [place], present throughout the entire case, necessary for timely completion of procedure.  ANESTHESIA:  General.  BLOOD LOSS:  Minimal.  SPECIMENS:  None.  CULTURES:  None.  COMPLICATION:  None.  DRESSINGS:  Soft compressive shoulder immobilizer.  DESCRIPTION OF PROCEDURE:  The patient was brought to the operating room, placed on the operating table in supine position.  After  adequate anesthesia had been obtained, placed in beach-chair position on the shoulder positioner, prepped and draped in usual sterile fashion. Shoulder examined.  Full motion, good stability.  Three portals anterior, posterior, and lateral.  Arthroscope introduced, shoulder distended and inspected.  Very pleased with a fiber cartilage coverage of the area of micro fracturing.  No progression of arthritis with no loose bodies.  Labrum intact.  Biceps tendon and biceps anchor intact. The supraspinatus was thinned and torn from attrition in the crescent region.  The sutures at the front and the back of the cable were still intact.  This was full-thickness in the area involved.  Debrided above and below.  Fair amount of adhesions above.  They were cleared out to mobilize the cuff.  Adequate acromioplasty, distal clavicle excision. With a cannula laterally, the cuff was captured with 2 horizontal mattress sutures, reanchored down the tuberosity with 2 swivel lock anchors.  Again I got a nice firm watertight closure.  Instruments were removed.  Portals were closed with nylon.  Sterile compressive dressing applied.  Shoulder immobilizer applied.  Anesthesia reversed.  Brought to the recovery room.  Tolerated the surgery well.  No complications.     Loreta Ave, M.D.     DFM/MEDQ  D:  07/04/2012  T:  07/04/2012  Job:  [id number]

## 2012-07-08 ENCOUNTER — Encounter (HOSPITAL_BASED_OUTPATIENT_CLINIC_OR_DEPARTMENT_OTHER): Payer: Self-pay | Admitting: Orthopedic Surgery

## 2012-09-25 ENCOUNTER — Other Ambulatory Visit (HOSPITAL_COMMUNITY): Payer: Self-pay | Admitting: Family Medicine

## 2012-09-25 ENCOUNTER — Other Ambulatory Visit: Payer: Self-pay | Admitting: Internal Medicine

## 2012-09-25 DIAGNOSIS — Z1231 Encounter for screening mammogram for malignant neoplasm of breast: Secondary | ICD-10-CM

## 2012-10-24 ENCOUNTER — Encounter: Payer: BC Managed Care – PPO | Admitting: Obstetrics & Gynecology

## 2012-10-24 ENCOUNTER — Ambulatory Visit: Payer: BC Managed Care – PPO | Admitting: Advanced Practice Midwife

## 2012-10-24 NOTE — Progress Notes (Signed)
This encounter was created in error - please disregard.

## 2012-10-31 ENCOUNTER — Ambulatory Visit (HOSPITAL_COMMUNITY): Payer: BC Managed Care – PPO

## 2012-12-18 ENCOUNTER — Other Ambulatory Visit (HOSPITAL_COMMUNITY): Payer: Self-pay | Admitting: Family Medicine

## 2012-12-18 DIAGNOSIS — Z1231 Encounter for screening mammogram for malignant neoplasm of breast: Secondary | ICD-10-CM

## 2012-12-27 ENCOUNTER — Ambulatory Visit (HOSPITAL_COMMUNITY): Payer: BC Managed Care – PPO | Attending: Family Medicine

## 2013-02-06 ENCOUNTER — Ambulatory Visit (HOSPITAL_COMMUNITY)
Admission: RE | Admit: 2013-02-06 | Discharge: 2013-02-06 | Disposition: A | Discharge status: Home / Self Care | Payer: BC Managed Care – PPO | Source: Ambulatory Visit | Attending: Family Medicine | Admitting: Family Medicine

## 2013-02-06 DIAGNOSIS — Z1231 Encounter for screening mammogram for malignant neoplasm of breast: Secondary | ICD-10-CM

## 2013-03-20 ENCOUNTER — Ambulatory Visit: Payer: BC Managed Care – PPO | Admitting: Advanced Practice Midwife

## 2013-03-31 ENCOUNTER — Ambulatory Visit: Payer: BC Managed Care – PPO | Admitting: Medical

## 2013-04-11 ENCOUNTER — Encounter: Payer: Self-pay | Admitting: Nurse Practitioner

## 2013-04-11 ENCOUNTER — Ambulatory Visit (INDEPENDENT_AMBULATORY_CARE_PROVIDER_SITE_OTHER): Payer: BC Managed Care – PPO | Admitting: Nurse Practitioner

## 2013-04-11 VITALS — BP 132/82 | HR 90 | Ht 67.0 in | Wt 229.2 lb

## 2013-04-11 DIAGNOSIS — E669 Obesity, unspecified: Secondary | ICD-10-CM

## 2013-04-11 DIAGNOSIS — Z01419 Encounter for gynecological examination (general) (routine) without abnormal findings: Secondary | ICD-10-CM

## 2013-04-11 NOTE — Progress Notes (Signed)
History:  Kathleen Polkowski Summersis a 53 y.o. No obstetric history on file. who presents to [facility] clinic today for annual exam. She had a medical provider that looks after her HTN and other medical issues. She had one abnormal pap when she was 52 years old and was negative on colposcopy. She has one sexual partner, her husband. She has been trying to lose weight and is going to water aerobics. Her pap smears are up to date and all negative.   The following portions of the patient's history were reviewed and updated as appropriate: allergies, current medications, past family history, past medical history, past social history, past surgical history and problem list.  Review of Systems:  Pertinent items are noted in HPI.  Objective:  Physical Exam BP 132/82  Pulse 90  Ht 5\' 7"  (1.702 m)  Wt 229 lb 3.2 oz (103.964 kg)  BMI 35.89 kg/m2  LMP 03/26/2013 GENERAL: Well-developed, well-nourished female in no acute distress. Obese HEENT: Normocephalic, atraumatic.  NECK: Supple. Normal thyroid.  LUNGS: Normal rate. Clear to auscultation bilaterally.  HEART: Regular rate and rhythm with no adventitious sounds.  BREASTS: Symmetric in size. No masses, skin changes, nipple drainage, or lymphadenopathy. ABDOMEN: Soft, nontender, nondistended. No organomegaly. Normal bowel sounds appreciated in all quadrants.  PELVIC: Normal external female genitalia. Vagina is pink and rugated.  Normal discharge. Normal cervix contour. Uterus is normal in size. No adnexal mass or tenderness.  EXTREMITIES: No cyanosis, clubbing, or edema, 2+ distal pulses.   Labs and Imaging No results found.  Assessment & Plan:  Assessment:  Well woman exam  Encounter for routine gynecological examination  Obesity (BMI 30-39.9)    Plans:  Will not obtain pap this year Continue mammogram yearly Encouraged pt to call nutritionist for support/ education in weight loss  Delbert Phenix, NP 04/11/2013 1:55 PM

## 2013-05-15 ENCOUNTER — Encounter: Payer: BC Managed Care – PPO | Attending: Internal Medicine | Admitting: Dietician

## 2013-05-15 ENCOUNTER — Encounter: Payer: Self-pay | Admitting: Dietician

## 2013-05-15 VITALS — Ht 68.0 in | Wt 229.1 lb

## 2013-05-15 DIAGNOSIS — Z713 Dietary counseling and surveillance: Secondary | ICD-10-CM | POA: Insufficient documentation

## 2013-05-15 DIAGNOSIS — R7309 Other abnormal glucose: Secondary | ICD-10-CM | POA: Insufficient documentation

## 2013-05-15 DIAGNOSIS — I10 Essential (primary) hypertension: Secondary | ICD-10-CM | POA: Insufficient documentation

## 2013-05-15 DIAGNOSIS — E669 Obesity, unspecified: Secondary | ICD-10-CM

## 2013-05-15 DIAGNOSIS — Z6830 Body mass index (BMI) 30.0-30.9, adult: Secondary | ICD-10-CM | POA: Insufficient documentation

## 2013-05-15 DIAGNOSIS — M1990 Unspecified osteoarthritis, unspecified site: Secondary | ICD-10-CM | POA: Insufficient documentation

## 2013-05-15 NOTE — Progress Notes (Signed)
Medical Nutrition Therapy:  Appt start time: 1600 end time:  1700.  Assessment:  Primary concerns today: Obesity, HTN, pt reports pre-DM. WC measured at 45.25 in.  Preferred Learning Style:   No preference indicated   Learning Readiness:   Contemplating  MEDICATIONS: see list.   DIETARY INTAKE: Usual eating pattern includes 2-3 meals and 2-3 snacks per day. Everyday foods include yogurt, oatmeal, banana.  Avoided foods include none noted.    24-hr recall:  B ( AM): light yogurt, oatmeal, sweetened with splenda, light butter, wheat toast (1 piece) with honey, jimmy deans [place] sausage (but has recently discontinued due to sodium content), [place] bacon used to be common but now discontinued, sometimes egg whites. Chocolate soy milk and a banana blended is an option when not very hungry. Water. When very not hungry will have only a fiber one bar or granola over yogurt with fruit.   Snk ( AM): banana or other fruit. Sometimes small bag of chips, favorite is wheat crackers or unsalted saltines with PB.   L ( PM): sometimes skips lunch due to lack of hunger. Often will have heaviest meal of day at about 2 pm for lunch- baked chix is common, ground [place], white tortillas, smoked [place] necks with cabbage (boiled together), rarely beef. Enjoys beets quite a bit, string beans, salads (mixed greens with light dressing, pecans, lots of cheese).  Snk ( PM): admits to possibly snacking too much at times. May have small bag of cheetohs, small [place] sandwich with light salad dressing on it.  D ( PM): [place] and cheese sandwich, succotash, yogurt.  Snk ( PM): 2-3 nights per week will have- usually yogurt or fruit cups (no sugar added) or sugar free jellf -o.  Beverages: water, no juice, no soda, sugar free flavored water. No EtOH.   Usual physical activity: tries to do 20 sit ups per day, enjoys water aerobics and other water based exercise. Does exercise for arthritis classes. Walks at the mall for  about 30 minutes 3 days per week. Pt claims she cannot walk outside when weather is bad or cold due to arthritis. Has an elliptical at home, and will do that about 15 minutes per day 3 days per week. Classes are done twice per week.  Pt is also considering investing in more exercise equipment.   Progress Towards Goal(s):  In progress.   Nutritional Diagnosis:  [place]-3.3 Overweight/obesity As related to low physical activity level, high kcal food choices (cheese, PB, etc.).  As evidenced by BMI>30, pt report.    Intervention:  Nutrition counseling provided regarding choosing lean proteins and high fiber foods, controlling kcal from snack choices, gaining more consistency to eating pattern in regards to meals, decreasing snacking with focus on more consistent protein and nonstarchy vegetable intake. RD recommended choosing smaller portions and lower kcal items for snacks, such as a small apple over a large banana, a small handful of almonds over PB crackers. RD also recommended the pt utilize the Plate Method for lunch and dinner (1/2 nonstarchy veg, 1/4 starch, 1/4 protein). RD recommended a fish oil supplement for BP control at 3000 mg omega 3 fatty acids per day.   RD recommended a number of methods to increase pt's exercise during colder months, including indoor access to a track, recumbent bike machine, more swimming, trying light yoga (most can be accessible with [employer]).   Teaching Method Utilized:  Visual Auditory  Handouts given during visit include:  Best Pro, Fat, and CHO rich  foods  How to Read a Supplement Label  Barriers to learning/adherence to lifestyle change: Osteoarthritis, snacking habits  Demonstrated degree of understanding via:  Teach Back   Monitoring/Evaluation:  Dietary intake, exercise, portion control, and body weight in 2 month(s).

## 2013-07-17 ENCOUNTER — Encounter: Payer: BC Managed Care – PPO | Attending: Internal Medicine | Admitting: Dietician

## 2013-07-17 DIAGNOSIS — E669 Obesity, unspecified: Secondary | ICD-10-CM | POA: Insufficient documentation

## 2013-07-17 DIAGNOSIS — Z6830 Body mass index (BMI) 30.0-30.9, adult: Secondary | ICD-10-CM | POA: Insufficient documentation

## 2013-07-17 DIAGNOSIS — Z713 Dietary counseling and surveillance: Secondary | ICD-10-CM | POA: Insufficient documentation

## 2013-07-17 DIAGNOSIS — R7309 Other abnormal glucose: Secondary | ICD-10-CM | POA: Insufficient documentation

## 2013-07-17 DIAGNOSIS — I10 Essential (primary) hypertension: Secondary | ICD-10-CM | POA: Insufficient documentation

## 2013-07-17 DIAGNOSIS — M1990 Unspecified osteoarthritis, unspecified site: Secondary | ICD-10-CM | POA: Insufficient documentation

## 2013-12-16 ENCOUNTER — Emergency Department (INDEPENDENT_AMBULATORY_CARE_PROVIDER_SITE_OTHER)
Admission: EM | Admit: 2013-12-16 | Discharge: 2013-12-16 | Disposition: A | Discharge status: Home / Self Care | Payer: Self-pay | Source: Home / Self Care

## 2013-12-16 ENCOUNTER — Encounter (HOSPITAL_COMMUNITY): Payer: Self-pay | Admitting: Emergency Medicine

## 2013-12-16 DIAGNOSIS — S335XXA Sprain of ligaments of lumbar spine, initial encounter: Secondary | ICD-10-CM

## 2013-12-16 DIAGNOSIS — S39012A Strain of muscle, fascia and tendon of lower back, initial encounter: Secondary | ICD-10-CM

## 2013-12-16 DIAGNOSIS — Z043 Encounter for examination and observation following other accident: Principal | ICD-10-CM

## 2013-12-16 DIAGNOSIS — Z041 Encounter for examination and observation following transport accident: Secondary | ICD-10-CM

## 2013-12-16 MED ORDER — TRAMADOL HCL 50 MG PO TABS: 50.0000 mg | ORAL_TABLET | Freq: Four times a day (QID) | ORAL | Status: DC | PRN

## 2013-12-16 MED ORDER — CYCLOBENZAPRINE HCL 5 MG PO TABS: 5.0000 mg | ORAL_TABLET | Freq: Three times a day (TID) | ORAL | Status: DC | PRN

## 2013-12-16 MED ORDER — KETOROLAC TROMETHAMINE 60 MG/2ML IM SOLN
60.0000 mg | Freq: Once | INTRAMUSCULAR | Status: AC
  Administered 2013-12-16: 60 mg via INTRAMUSCULAR

## 2013-12-16 MED ORDER — KETOROLAC TROMETHAMINE 60 MG/2ML IM SOLN
INTRAMUSCULAR | Status: AC
  Filled 2013-12-16: qty 2

## 2013-12-16 NOTE — ED Provider Notes (Signed)
Medical screening examination/treatment/procedure(s) were performed by a resident physician or non-physician practitioner and as the supervising physician I was immediately available for consultation/collaboration.  Linna Darner, MD Family Medicine   Waldemar Dickens, MD 12/16/13 [phone]
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
## 2013-12-16 NOTE — Discharge Instructions (Signed)
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
## 2014-01-01 ENCOUNTER — Encounter (HOSPITAL_COMMUNITY): Payer: Self-pay | Admitting: Emergency Medicine

## 2014-01-01 ENCOUNTER — Emergency Department (HOSPITAL_COMMUNITY)
Admission: EM | Admit: 2014-01-01 | Discharge: 2014-01-02 | Disposition: A | Discharge status: Other Acute Inpatient Hospital | Payer: Self-pay | Attending: Emergency Medicine | Admitting: Emergency Medicine

## 2014-01-01 DIAGNOSIS — F411 Generalized anxiety disorder: Secondary | ICD-10-CM | POA: Insufficient documentation

## 2014-01-01 DIAGNOSIS — Z8709 Personal history of other diseases of the respiratory system: Secondary | ICD-10-CM | POA: Insufficient documentation

## 2014-01-01 DIAGNOSIS — IMO0001 Reserved for inherently not codable concepts without codable children: Secondary | ICD-10-CM | POA: Insufficient documentation

## 2014-01-01 DIAGNOSIS — IMO0002 Reserved for concepts with insufficient information to code with codable children: Secondary | ICD-10-CM | POA: Insufficient documentation

## 2014-01-01 DIAGNOSIS — F329 Major depressive disorder, single episode, unspecified: Secondary | ICD-10-CM | POA: Insufficient documentation

## 2014-01-01 DIAGNOSIS — I10 Essential (primary) hypertension: Secondary | ICD-10-CM | POA: Insufficient documentation

## 2014-01-01 DIAGNOSIS — F3289 Other specified depressive episodes: Secondary | ICD-10-CM | POA: Insufficient documentation

## 2014-01-01 DIAGNOSIS — R45851 Suicidal ideations: Secondary | ICD-10-CM | POA: Insufficient documentation

## 2014-01-01 DIAGNOSIS — R51 Headache: Secondary | ICD-10-CM | POA: Insufficient documentation

## 2014-01-01 DIAGNOSIS — M1710 Unilateral primary osteoarthritis, unspecified knee: Secondary | ICD-10-CM | POA: Insufficient documentation

## 2014-01-01 DIAGNOSIS — H539 Unspecified visual disturbance: Secondary | ICD-10-CM | POA: Insufficient documentation

## 2014-01-01 DIAGNOSIS — E039 Hypothyroidism, unspecified: Secondary | ICD-10-CM | POA: Insufficient documentation

## 2014-01-01 DIAGNOSIS — Z79899 Other long term (current) drug therapy: Secondary | ICD-10-CM | POA: Insufficient documentation

## 2014-01-01 DIAGNOSIS — M549 Dorsalgia, unspecified: Secondary | ICD-10-CM | POA: Insufficient documentation

## 2014-01-01 LAB — COMPREHENSIVE METABOLIC PANEL
ALT: 18 U/L (ref 0–35)
AST: 22 U/L (ref 0–37)
Albumin: 4 g/dL (ref 3.5–5.2)
Alkaline Phosphatase: 55 U/L (ref 39–117)
Anion gap: 14 (ref 5–15)
BILIRUBIN TOTAL: 0.6 mg/dL (ref 0.3–1.2)
BUN: 10 mg/dL (ref 6–23)
CALCIUM: 9.2 mg/dL (ref 8.4–10.5)
CHLORIDE: 99 meq/L (ref 96–112)
CO2: 25 meq/L (ref 19–32)
Creatinine, Ser: 0.77 mg/dL (ref 0.50–1.10)
GLUCOSE: 103 mg/dL — AB (ref 70–99)
Potassium: 3.7 mEq/L (ref 3.7–5.3)
Sodium: 138 mEq/L (ref 137–147)
Total Protein: 7.7 g/dL (ref 6.0–8.3)

## 2014-01-01 LAB — CBC
HCT: 40.9 % (ref 36.0–46.0)
HEMOGLOBIN: 13.5 g/dL (ref 12.0–15.0)
MCH: 26.6 pg (ref 26.0–34.0)
MCHC: 33 g/dL (ref 30.0–36.0)
MCV: 80.7 fL (ref 78.0–100.0)
PLATELETS: 303 10*3/uL (ref 150–400)
RBC: 5.07 MIL/uL (ref 3.87–5.11)
RDW: 13.2 % (ref 11.5–15.5)
WBC: 5.3 10*3/uL (ref 4.0–10.5)

## 2014-01-01 LAB — RAPID URINE DRUG SCREEN, HOSP PERFORMED
AMPHETAMINES: NOT DETECTED
BARBITURATES: NOT DETECTED
BENZODIAZEPINES: NOT DETECTED
Cocaine: NOT DETECTED
Opiates: NOT DETECTED
Tetrahydrocannabinol: NOT DETECTED

## 2014-01-01 LAB — ETHANOL: Alcohol, Ethyl (B): <11 mg/dL (ref 0–11)

## 2014-01-01 LAB — ACETAMINOPHEN LEVEL: Acetaminophen (Tylenol), Serum: <15 ug/mL (ref 10–30)

## 2014-01-01 LAB — TSH: TSH: 0.879 u[IU]/mL (ref 0.350–4.500)

## 2014-01-01 LAB — SALICYLATE LEVEL

## 2014-01-01 MED ORDER — IBUPROFEN 400 MG PO TABS
600.0000 mg | ORAL_TABLET | Freq: Once | ORAL | Status: AC
  Administered 2014-01-01: 600 mg via ORAL
  Filled 2014-01-01 (×2): qty 1

## 2014-01-01 MED ORDER — LEVOTHYROXINE SODIUM 175 MCG PO TABS
175.0000 ug | ORAL_TABLET | Freq: Every day | ORAL | Status: DC
  Administered 2014-01-02: 175 ug via ORAL
  Filled 2014-01-01 (×2): qty 1

## 2014-01-01 MED ORDER — ACETAMINOPHEN 325 MG PO TABS
650.0000 mg | ORAL_TABLET | ORAL | Status: DC | PRN
  Administered 2014-01-01: 650 mg via ORAL
  Filled 2014-01-01: qty 2

## 2014-01-01 MED ORDER — TRIAMTERENE-HCTZ 37.5-25 MG PO TABS
1.0000 | ORAL_TABLET | Freq: Every day | ORAL | Status: DC
  Filled 2014-01-01 (×2): qty 1

## 2014-01-01 NOTE — ED Notes (Signed)
Pt states that she has been having suicidal thoughts with plan. Pt will not tell me the plan but states that it was for today. Pt states that she has been hearing voices that are telling her to hurt herself as well. Pt states that this has been ongoing for several weeks. Pt states that she has had a lot of stress recently

## 2014-01-01 NOTE — ED Notes (Signed)
Pt to br for self care and grooming returned to room sitter with pt

## 2014-01-01 NOTE — ED Notes (Signed)
wanded by security belongings in bag

## 2014-01-01 NOTE — ED Notes (Signed)
Pt's daughter and best friend at bedside.

## 2014-01-01 NOTE — ED Notes (Signed)
AC called for sitter
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
## 2014-01-01 NOTE — ED Notes (Signed)
telepsych in process. 

## 2014-01-02 ENCOUNTER — Inpatient Hospital Stay (HOSPITAL_COMMUNITY)
Admission: EM | Admit: 2014-01-02 | Discharge: 2014-01-07 | DRG: 885 | Disposition: A | Discharge status: Home / Self Care | Payer: Federal, State, Local not specified - Other | Source: Intra-hospital | Attending: Psychiatry | Admitting: Psychiatry

## 2014-01-02 ENCOUNTER — Encounter (HOSPITAL_COMMUNITY): Payer: Self-pay | Admitting: *Deleted

## 2014-01-02 DIAGNOSIS — Z8249 Family history of ischemic heart disease and other diseases of the circulatory system: Secondary | ICD-10-CM | POA: Diagnosis not present

## 2014-01-02 DIAGNOSIS — R45851 Suicidal ideations: Secondary | ICD-10-CM | POA: Diagnosis not present

## 2014-01-02 DIAGNOSIS — Z800 Family history of malignant neoplasm of digestive organs: Secondary | ICD-10-CM | POA: Diagnosis not present

## 2014-01-02 DIAGNOSIS — G4700 Insomnia, unspecified: Secondary | ICD-10-CM | POA: Diagnosis present

## 2014-01-02 DIAGNOSIS — E039 Hypothyroidism, unspecified: Secondary | ICD-10-CM | POA: Diagnosis present

## 2014-01-02 DIAGNOSIS — G8929 Other chronic pain: Secondary | ICD-10-CM | POA: Diagnosis present

## 2014-01-02 DIAGNOSIS — F333 Major depressive disorder, recurrent, severe with psychotic symptoms: Secondary | ICD-10-CM | POA: Diagnosis present

## 2014-01-02 DIAGNOSIS — F4310 Post-traumatic stress disorder, unspecified: Secondary | ICD-10-CM | POA: Diagnosis present

## 2014-01-02 DIAGNOSIS — M549 Dorsalgia, unspecified: Secondary | ICD-10-CM | POA: Diagnosis present

## 2014-01-02 DIAGNOSIS — M1710 Unilateral primary osteoarthritis, unspecified knee: Secondary | ICD-10-CM | POA: Diagnosis present

## 2014-01-02 DIAGNOSIS — Z609 Problem related to social environment, unspecified: Secondary | ICD-10-CM | POA: Diagnosis not present

## 2014-01-02 DIAGNOSIS — Z833 Family history of diabetes mellitus: Secondary | ICD-10-CM | POA: Diagnosis not present

## 2014-01-02 DIAGNOSIS — I10 Essential (primary) hypertension: Secondary | ICD-10-CM | POA: Diagnosis present

## 2014-01-02 DIAGNOSIS — F411 Generalized anxiety disorder: Secondary | ICD-10-CM | POA: Diagnosis present

## 2014-01-02 DIAGNOSIS — F32A Depression, unspecified: Secondary | ICD-10-CM | POA: Diagnosis present

## 2014-01-02 DIAGNOSIS — F329 Major depressive disorder, single episode, unspecified: Secondary | ICD-10-CM | POA: Diagnosis present

## 2014-01-02 DIAGNOSIS — J309 Allergic rhinitis, unspecified: Secondary | ICD-10-CM

## 2014-01-02 HISTORY — DX: Anxiety disorder, unspecified: F41.9

## 2014-01-02 HISTORY — DX: Major depressive disorder, single episode, unspecified: F32.9

## 2014-01-02 HISTORY — DX: Depression, unspecified: F32.A

## 2014-01-02 MED ORDER — MONTELUKAST SODIUM 10 MG PO TABS
10.0000 mg | ORAL_TABLET | Freq: Every day | ORAL | Status: DC | PRN
Start: 2014-01-02 — End: 2014-01-07

## 2014-01-02 MED ORDER — MAGNESIUM HYDROXIDE 400 MG/5ML PO SUSP: 30.0000 mL | Freq: Every day | ORAL | Status: DC | PRN

## 2014-01-02 MED ORDER — TRIAMTERENE-HCTZ 37.5-25 MG PO TABS
1.0000 | ORAL_TABLET | Freq: Every day | ORAL | Status: DC
  Administered 2014-01-02 – 2014-01-07 (×6): 1 via ORAL
  Filled 2014-01-02 (×9): qty 1
  Filled 2014-01-02: qty 14

## 2014-01-02 MED ORDER — CYCLOBENZAPRINE HCL 10 MG PO TABS
5.0000 mg | ORAL_TABLET | Freq: Three times a day (TID) | ORAL | Status: DC | PRN
  Administered 2014-01-02 – 2014-01-04 (×3): 5 mg via ORAL
  Filled 2014-01-02 (×3): qty 1

## 2014-01-02 MED ORDER — ARIPIPRAZOLE 5 MG PO TABS
5.0000 mg | ORAL_TABLET | Freq: Every day | ORAL | Status: DC
  Administered 2014-01-02 – 2014-01-03 (×2): 5 mg via ORAL
  Filled 2014-01-02 (×5): qty 1

## 2014-01-02 MED ORDER — VITAMIN D 1000 UNITS PO TABS
ORAL_TABLET | ORAL | Status: AC
  Filled 2014-01-02: qty 1

## 2014-01-02 MED ORDER — IBUPROFEN 200 MG PO TABS
400.0000 mg | ORAL_TABLET | Freq: Four times a day (QID) | ORAL | Status: DC | PRN
  Administered 2014-01-02 – 2014-01-04 (×3): 400 mg via ORAL
  Filled 2014-01-02 (×4): qty 2

## 2014-01-02 MED ORDER — BOOST PLUS PO LIQD
237.0000 mL | Freq: Two times a day (BID) | ORAL | Status: DC
  Administered 2014-01-02 – 2014-01-03 (×3): 237 mL via ORAL
  Filled 2014-01-02 (×8): qty 237

## 2014-01-02 MED ORDER — TRAZODONE HCL 50 MG PO TABS
50.0000 mg | ORAL_TABLET | Freq: Every day | ORAL | Status: DC
Start: 2014-01-02 — End: 2014-01-05
  Administered 2014-01-02 – 2014-01-04 (×3): 50 mg via ORAL
  Filled 2014-01-02 (×5): qty 1

## 2014-01-02 MED ORDER — SERTRALINE HCL 25 MG PO TABS
25.0000 mg | ORAL_TABLET | Freq: Every day | ORAL | Status: DC
Start: 2014-01-02 — End: 2014-01-04
  Administered 2014-01-03 – 2014-01-04 (×2): 25 mg via ORAL
  Filled 2014-01-02 (×6): qty 1

## 2014-01-02 MED ORDER — VITAMIN D3 25 MCG (1000 UNIT) PO TABS
1000.0000 [IU] | ORAL_TABLET | Freq: Every day | ORAL | Status: DC
  Administered 2014-01-02 – 2014-01-07 (×6): 1000 [IU] via ORAL
  Filled 2014-01-02 (×3): qty 1
  Filled 2014-01-02: qty 14
  Filled 2014-01-02 (×5): qty 1

## 2014-01-02 MED ORDER — ADULT MULTIVITAMIN W/MINERALS CH
1.0000 | ORAL_TABLET | Freq: Every day | ORAL | Status: DC
  Administered 2014-01-02 – 2014-01-07 (×6): 1 via ORAL
  Filled 2014-01-02: qty 1
  Filled 2014-01-02: qty 14
  Filled 2014-01-02 (×7): qty 1

## 2014-01-02 MED ORDER — LEVOTHYROXINE SODIUM 175 MCG PO TABS
175.0000 ug | ORAL_TABLET | Freq: Every day | ORAL | Status: DC
  Administered 2014-01-03 – 2014-01-07 (×5): 175 ug via ORAL
  Filled 2014-01-02 (×7): qty 1

## 2014-01-02 MED ORDER — ALUM & MAG HYDROXIDE-SIMETH 200-200-20 MG/5ML PO SUSP
30.0000 mL | ORAL | Status: DC | PRN
  Administered 2014-01-03 – 2014-01-06 (×4): 30 mL via ORAL

## 2014-01-02 MED ORDER — ADULT MULTIVITAMIN W/MINERALS CH
ORAL_TABLET | ORAL | Status: AC
  Filled 2014-01-02: qty 1

## 2014-01-02 MED ORDER — SERTRALINE HCL 25 MG PO TABS
ORAL_TABLET | ORAL | Status: AC
  Filled 2014-01-02: qty 1

## 2014-01-02 MED ADMIN — Sertraline HCl Tab 25 MG: ORAL | @ 12:00:00

## 2014-01-02 NOTE — BHH Group Notes (Signed)
[place] LCSW Group Therapy  01/02/2014  1:05 PM  Type of Therapy:  Group therapy  Participation Level:  Active  Participation Quality:  Attentive  Affect:  Flat  Cognitive:  Oriented  Insight:  Limited  Engagement in Therapy:  Limited  Modes of Intervention:  Discussion, Socialization  Summary of Progress/Problems:  Chaplain was here to lead a group on themes of hope and courage.'i don't have hope because this is a selfish generation.  People don't really care about each other anymore.  People might be able to do right for a minute, but then they go back to the way they really were."  "People who get paid to listen don't really care.  They don't know what it is like for me.  This is helpful being around people here who get it, because they know what I am going through."  Went back to family members and how they do not accept her, or at least how she feels unaccepted by them.  Said that her hope comes from a higher power.Roque Lias B 01/02/2014 11:05 AM

## 2014-01-02 NOTE — BH Assessment (Signed)
[facility] Assessment Progress Note   Dr. Louretta Shorten accepted patient to [facility].  Irine Seal [place], [facility] said that patient could go to room 400-1.  Nurse Mat at [facility] was made aware of the pt being accepted.  Also was informed that patient is voluntary and will need to complete voluntary admission paperwork and be transported by [employer].
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
## 2014-01-02 NOTE — ED Provider Notes (Signed)
I saw and evaluated the patient, reviewed the resident's note and I agree with the findings and plan. Patient is a 53 year old female who presents with complaints of headache and suicidal ideation. She reports a great deal of stress recently and feels as though she might be better off if she was dead. She has a history of one previous overdose, but denies being treated chronically for depression.  On exam, vitals are stable and she is afebrile. Head is atraumatic, normocephalic. Neck is supple. Heart is regular rate and rhythm and lungs are clear. She appears somewhat withdrawn and mood is depressed. Her speech is otherwise slow but appropriate.  She appears to be medically cleared and will be evaluated by TTS. It appears as though she meets inpatient criteria and is currently waiting on that placement.      Veryl Speak, MD 01/02/14 [phone]
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
## 2014-01-02 NOTE — Tx Team (Signed)
Initial Interdisciplinary Treatment Plan   PATIENT STRESSORS: Legal issue   PROBLEM LIST: Problem List/Patient Goals Date to be addressed Date deferred Reason deferred Estimated date of resolution  Legal issues 01/02/14     Decline in health 01/02/14                                                DISCHARGE CRITERIA:  Improved stabilization in mood, thinking, and/or behavior  PRELIMINARY DISCHARGE PLAN: Outpatient therapy  PATIENT/FAMIILY INVOLVEMENT: This treatment plan has been presented to and reviewed with the patient, Kathleen Mcfarland, and/or family member   The patient and family have been given the opportunity to ask questions and make suggestions.  Marcello Moores [facility] 01/02/2014, 10:33 AM
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
## 2014-01-02 NOTE — Progress Notes (Signed)
NUTRITION ASSESSMENT  Pt identified as at risk on the Malnutrition Screen Tool  INTERVENTION: 1. Educated patient on the importance of nutrition and encouraged intake of food and beverages. 2. Discussed weight goals. 3. Supplements: Boost BID  NUTRITION DIAGNOSIS: Unintentional weight loss related to sub-optimal intake as evidenced by pt report.   Goal: Pt to meet >/= 90% of their estimated nutrition needs.  Monitor:  PO intake  Assessment:  Patient admitted with depression, suicidal ideations. She reports a decreased appetite over the last 2 months, not wanting to eat much. She is eating fair currently, but states that this is because she has to. She reports a 40 pound weight loss, but per chart review, she has lost 20 pounds in 8 months (9% weight loss).   53 y.o. female  Height: Ht Readings from Last 1 Encounters:  01/02/14 5\' 9"  (1.753 m)    Weight: Wt Readings from Last 1 Encounters:  01/02/14 209 lb (94.802 kg)    Weight Hx: Wt Readings from Last 10 Encounters:  01/02/14 209 lb (94.802 kg)  05/15/13 229 lb 1.6 oz (103.919 kg)  04/11/13 229 lb 3.2 oz (103.964 kg)  07/04/12 223 lb 6 oz (101.322 kg)  07/04/12 223 lb 6 oz (101.322 kg)  11/23/11 225 lb 14.4 oz (102.468 kg)  04/13/11 240 lb (108.863 kg)  04/13/11 240 lb (108.863 kg)  01/09/11 243 lb (110.224 kg)  07/21/10 242 lb 12.8 oz (110.133 kg)    BMI:  Body mass index is 30.85 kg/(m^2). Pt meets criteria for obesity, class I based on current BMI.  Estimated Nutritional Needs: Kcal: 25-30 kcal/kg Protein: > 1 gram protein/kg Fluid: 1 ml/kcal  Diet Order: General Pt is also offered choice of unit snacks mid-morning and mid-afternoon.  Pt is eating as desired.   Lab results and medications reviewed.   Larey Seat, RD, LDN Pager #: [phone] After-Hours Pager #: [phone]

## 2014-01-03 ENCOUNTER — Encounter (HOSPITAL_COMMUNITY): Payer: Self-pay | Admitting: Psychiatry

## 2014-01-03 DIAGNOSIS — F333 Major depressive disorder, recurrent, severe with psychotic symptoms: Principal | ICD-10-CM

## 2014-01-03 DIAGNOSIS — R45851 Suicidal ideations: Secondary | ICD-10-CM

## 2014-01-03 NOTE — Progress Notes (Signed)
D. Pt has been up and visible in milieu this evening and has been attending and participating in various milieu activities. Pt spoke about her depression and also sleep issues and her inability to fall asleep. Pt seen interacting appropriately with fellow peers in milieu. A. Support and encouragement provided, and medication education given. R. Pt verbalized understanding, safety maintained.

## 2014-01-03 NOTE — Progress Notes (Signed)
D: Pt presents with depressed mood and affect.  Pt reports "I'm not feeling too good, I'm depressed.  My goal for today was to start having some hope."  Pt denies SI/HI and visual hallucinations.  Pt reports auditory hallucinations of a voice.   A: Medications administered per order.  Encouraged and supported pt.  Safety maintained.  R: Pt is cooperative with staff and interacts appropriately with peers.  Pt verbally contracts for safety, stating "I would not hurt myself here."  Pt is compliant with medications and is in no acute distress at this time.  Will continue to monitor and assess for safety.

## 2014-01-03 NOTE — Progress Notes (Signed)
[facility] Notes:  (Nursing/MHT/Case Management/Adjunct)  Date:  01/03/2014  Time:  9:53 AM  Type of Therapy:  Psychoeducational Skills  Participation Level:  Active  Participation Quality:  Appropriate and Attentive  Affect:  Appropriate  Cognitive:  Alert and Appropriate  Insight:  Appropriate and Good  Engagement in Group:  Supportive  Modes of Intervention:  Discussion and Education  Summary of Progress/Problems:  Kathleen Mcfarland 01/03/2014, 9:53 AM

## 2014-01-03 NOTE — Progress Notes (Signed)
D: Patient denies suicidal and homicidal ideations and visual hallucinations. The patient is having auditory hallucinations and reports hearing a voice "occassionally throughout the day." The patient has an anxious mood and affect. The patient is attending groups and interacting appropriately within the milieu.  A: Patient given emotional support from RN. Patient encouraged to come to staff with concerns and/or questions. Patient's medication routine continued. Patient's orders and plan of care reviewed.  R: Patient remains appropriate and cooperative. Will continue to monitor patient q15 minutes for safety.

## 2014-01-03 NOTE — [facility] Group Notes (Signed)
[facility] Notes:  (Clinical Social Work)  01/03/2014  11:15-12:00PM  Summary of Progress/Problems:   The main focus of today's process group was to discuss patients' feelings about hospitalization, the stigma attached to mental health, and sources of motivation to stay well.  We then worked to identify a specific plan to avoid future hospitalizations when discharged from the hospital for this admission.  The patient expressed that she is in pain today (7 out of 10) and that she is here for suicidal ideation and depression.  She said her feeling about being in the hospital is that she is judged.  When CSW suggested first educating self and then others about one's mental health diagnosis, she was resistant, stating that people who were not present for her and supportive during her good times were not going to be invited into her life during her bad times.  Type of Therapy:  Group Therapy - Process  Participation Level:  Active  Participation Quality:  Attentive and Sharing  Affect:  Blunted and Depressed  Cognitive:  Alert and Oriented  Insight:  Developing/Improving  Engagement in Therapy:  Engaged  Modes of Intervention:  Exploration, Discussion  Selmer Dominion, LCSW 01/03/2014, 12:35 PM

## 2014-01-03 NOTE — Progress Notes (Signed)
[facility] Notes:  (Nursing/MHT/Case Management/Adjunct)  Date:  01/03/2014  Time:  9:19 PM  Type of Therapy:  Group Therapy  Participation Level:  Active  Participation Quality:  Appropriate  Affect:  Appropriate  Cognitive:  Appropriate  Insight:  Good  Engagement in Group:  Engaged  Modes of Intervention:  Socialization and Support  Summary of Progress/Problems: Pt. Stated her energy level is 6.  Pt. Stated she likes to exercise by walking and planting flowers as a coping skills.   Lanell Persons 01/03/2014, 9:19 PM

## 2014-01-03 NOTE — Progress Notes (Signed)
[facility] MD Progress Note  01/03/2014 3:22 PM Kathleen Mcfarland  MRN:  [id number] Subjective:  "I have been hearing voices and feeling suicidal.'' Objective: Patient was seen and chart reviewed. She reports command auditory hallucination, hearing voices telling her to hurt herself, feeling hopeless, worthless, depressed and suicidal. She denies mood swings, visual hallucinations or homicidal thoughts. Patient is compliant with the unit milieu and her medications. She has not verbalized any adverse reactions to her medications.  Diagnosis:   DSM5: Schizophrenia Disorders:  Brief Psychotic Disorder (298.8) Obsessive-Compulsive Disorders:   Trauma-Stressor Disorders:   Substance/Addictive Disorders:   Depressive Disorders:  Major Depressive Disorder - with Psychotic Features (296.24) Total Time spent with patient: 30 minutes  Axis I: Major depressive disorder, recurrent episode, severe, specified as with psychotic behavior  Axis II: Deferred Axis III:  Past Medical History  Diagnosis Date  . Allergic rhinitis   . Hypothyroidism   . Hypertension   . Osteoarthritis of knee   . Osteoarthritis 2012    bilateral knees  . Complication of anesthesia     hard to wake up  . Back pain   . Depression   . Anxiety    Axis IV: other psychosocial or environmental problems and problems related to social environment  ADL's:  Intact  Sleep: Fair  Appetite:  Fair  Suicidal Ideation: yes Plan:  denies Homicidal Ideation:  denies AEB (as evidenced by):  Psychiatric Specialty Exam: Physical Exam  Review of Systems  Constitutional: Negative.   HENT: Negative.   Eyes: Negative.   Respiratory: Negative.   Cardiovascular: Negative.   Gastrointestinal: Negative.   Genitourinary: Negative.   Musculoskeletal: Positive for myalgias.  Skin: Negative.   Neurological: Negative.   Endo/Heme/Allergies: Negative.   Psychiatric/Behavioral: Positive for depression, suicidal ideas and hallucinations.  The patient is nervous/anxious and has insomnia.     Blood pressure 112/65, pulse 110, temperature 97.9 F (36.6 C), temperature source Oral, resp. rate 18, height 5' 9"  (1.753 m), weight 94.802 kg (209 lb), last menstrual period 01/01/2014, SpO2 100.00%.Body mass index is 30.85 kg/(m^2).  General Appearance: Disheveled  Eye Contact::  Minimal  Speech:  Clear and Coherent and Slow  Volume:  Decreased  Mood:  Depressed and Dysphoric  Affect:  Constricted  Thought Process:  Goal Directed  Orientation:  Full (Time, Place, and Person)  Thought Content:  Hallucinations: Auditory  Suicidal Thoughts:  Yes.  without intent/plan  Homicidal Thoughts:  No  Memory:  Immediate;   Fair Recent;   Fair Remote;   Fair  Judgement:  Impaired  Insight:  Lacking  Psychomotor Activity:  Decreased  Concentration:  Fair  Recall:  [employer] of Knowledge:Fair  Language: Fair  Akathisia:  No  Handed:  Right  AIMS (if indicated):     Assets:  Communication Skills Desire for Improvement Physical Health  Sleep:  Number of Hours: 6.75   Musculoskeletal: Strength & Muscle Tone: within normal limits Gait & Station: normal Patient leans: N/A  Current Medications: Current Facility-Administered Medications  Medication Dose Route Frequency Provider Last Rate Last Dose  . alum & mag hydroxide-simeth (MAALOX/MYLANTA) 200-200-20 MG/5ML suspension 30 mL  30 mL Oral Q4H PRN Ursula Alert, MD   30 mL at 01/03/14 0810  . ARIPiprazole (ABILIFY) tablet 5 mg  5 mg Oral QHS Ursula Alert, MD   5 mg at 01/02/14 2127  . cholecalciferol (VITAMIN D) tablet 1,000 Units  1,000 Units Oral Daily Ursula Alert, MD   1,000 Units at 01/03/14  1610  . cyclobenzaprine (FLEXERIL) tablet 5 mg  5 mg Oral TID PRN Ursula Alert, MD   5 mg at 01/02/14 2008  . ibuprofen (ADVIL,MOTRIN) tablet 400 mg  400 mg Oral Q6H PRN Ursula Alert, MD   400 mg at 01/02/14 2008  . lactose free nutrition (BOOST PLUS) liquid 237 mL  237 mL Oral BID  BM Elyse A Shearer, RD   237 mL at 01/03/14 1400  . levothyroxine (SYNTHROID, LEVOTHROID) tablet 175 mcg  175 mcg Oral QAC breakfast Ursula Alert, MD   175 mcg at 01/03/14 0747  . magnesium hydroxide (MILK OF MAGNESIA) suspension 30 mL  30 mL Oral Daily PRN Saramma Eappen, MD      . montelukast (SINGULAIR) tablet 10 mg  10 mg Oral Daily PRN Ursula Alert, MD      . multivitamin with minerals tablet 1 tablet  1 tablet Oral Daily Ursula Alert, MD   1 tablet at 01/03/14 0747  . sertraline (ZOLOFT) tablet 25 mg  25 mg Oral Daily Ursula Alert, MD   25 mg at 01/03/14 0747  . traZODone (DESYREL) tablet 50 mg  50 mg Oral QHS Ursula Alert, MD   50 mg at 01/02/14 2245  . triamterene-hydrochlorothiazide (MAXZIDE-25) 37.5-25 MG per tablet 1 tablet  1 tablet Oral Daily Ursula Alert, MD   1 tablet at 01/03/14 9604    Lab Results:  Results for orders placed during the hospital encounter of 01/01/14 (from the past 48 hour(s))  ACETAMINOPHEN LEVEL     Status: None   Collection Time    01/01/14  3:29 PM      Result Value Ref Range   Acetaminophen (Tylenol), Serum <15.0  10 - 30 ug/mL   Comment:            THERAPEUTIC CONCENTRATIONS VARY     SIGNIFICANTLY. A RANGE OF 10-30     ug/mL MAY BE AN EFFECTIVE     CONCENTRATION FOR MANY PATIENTS.     HOWEVER, SOME ARE BEST TREATED     AT CONCENTRATIONS OUTSIDE THIS     RANGE.     ACETAMINOPHEN CONCENTRATIONS     >150 ug/mL AT 4 HOURS AFTER     INGESTION AND >50 ug/mL AT 12     HOURS AFTER INGESTION ARE     OFTEN ASSOCIATED WITH TOXIC     REACTIONS.  CBC     Status: None   Collection Time    01/01/14  3:29 PM      Result Value Ref Range   WBC 5.3  4.0 - 10.5 K/uL   RBC 5.07  3.87 - 5.11 MIL/uL   Hemoglobin 13.5  12.0 - 15.0 g/dL   HCT 40.9  36.0 - 46.0 %   MCV 80.7  78.0 - 100.0 fL   MCH 26.6  26.0 - 34.0 pg   MCHC 33.0  30.0 - 36.0 g/dL   RDW 13.2  11.5 - 15.5 %   Platelets 303  150 - 400 K/uL  COMPREHENSIVE METABOLIC PANEL     Status:  Abnormal   Collection Time    01/01/14  3:29 PM      Result Value Ref Range   Sodium 138  137 - 147 mEq/L   Potassium 3.7  3.7 - 5.3 mEq/L   Chloride 99  96 - 112 mEq/L   CO2 25  19 - 32 mEq/L   Glucose, Bld 103 (*) 70 - 99 mg/dL   BUN 10  6 -  23 mg/dL   Creatinine, Ser 0.77  0.50 - 1.10 mg/dL   Calcium 9.2  8.4 - 10.5 mg/dL   Total Protein 7.7  6.0 - 8.3 g/dL   Albumin 4.0  3.5 - 5.2 g/dL   AST 22  0 - 37 U/L   ALT 18  0 - 35 U/L   Alkaline Phosphatase 55  39 - 117 U/L   Total Bilirubin 0.6  0.3 - 1.2 mg/dL   GFR calc non Af Amer >90  >90 mL/min   GFR calc Af Amer >90  >90 mL/min   Comment: (NOTE)     The eGFR has been calculated using the CKD EPI equation.     This calculation has not been validated in all clinical situations.     eGFR's persistently <90 mL/min signify possible Chronic Kidney     Disease.   Anion gap 14  5 - 15  ETHANOL     Status: None   Collection Time    01/01/14  3:29 PM      Result Value Ref Range   Alcohol, Ethyl (B) <11  0 - 11 mg/dL   Comment:            LOWEST DETECTABLE LIMIT FOR     SERUM ALCOHOL IS 11 mg/dL     FOR MEDICAL PURPOSES ONLY  SALICYLATE LEVEL     Status: Abnormal   Collection Time    01/01/14  3:29 PM      Result Value Ref Range   Salicylate Lvl <7.6 (*) 2.8 - 20.0 mg/dL  URINE RAPID DRUG SCREEN (HOSP PERFORMED)     Status: None   Collection Time    01/01/14  3:30 PM      Result Value Ref Range   Opiates NONE DETECTED  NONE DETECTED   Cocaine NONE DETECTED  NONE DETECTED   Benzodiazepines NONE DETECTED  NONE DETECTED   Amphetamines NONE DETECTED  NONE DETECTED   Tetrahydrocannabinol NONE DETECTED  NONE DETECTED   Barbiturates NONE DETECTED  NONE DETECTED   Comment:            DRUG SCREEN FOR MEDICAL PURPOSES     ONLY.  IF CONFIRMATION IS NEEDED     FOR ANY PURPOSE, NOTIFY LAB     WITHIN 5 DAYS.                LOWEST DETECTABLE LIMITS     FOR URINE DRUG SCREEN     Drug Class       Cutoff (ng/mL)     Amphetamine       1000     Barbiturate      200     Benzodiazepine   720     Tricyclics       947     Opiates          300     Cocaine          300     THC              50  TSH     Status: None   Collection Time    01/01/14  4:27 PM      Result Value Ref Range   TSH 0.879  0.350 - 4.500 uIU/mL    Physical Findings: AIMS:  , ,  ,  ,    CIWA:    COWS:     Treatment Plan Summary: Daily contact with patient to assess and  evaluate symptoms and progress in treatment Medication management  Plan:1. Admit for crisis management and stabilization. 2. Medication management to reduce current symptoms to base line and improve the  patient's overall level of functioning: Will continue Zoloft 25 mg po daily for depression/anxiety.  Will continue Abilify 5 mg po bid to augment the antidepressant as well as help with psychosis.  Will continue Trazodone 50 mg po qhs for sleep.  Will continue Maxzide 25 mg po for her HTN  Will continue Synthroid 185mg for hypothyroidism.  Will continue flexeril for muscle spasm as scheduled. 3. Treat health problems as indicated. 4. Develop treatment plan to decrease risk of relapse upon discharge and the need for  readmission. 5. Psycho-social education regarding relapse prevention and self care. 6. Health care follow up as needed for medical problems. 7. Restart home medications where appropriate.   Medical Decision Making Problem Points:  Established problem, worsening (2), Review of last therapy session (1) and Review of psycho-social stressors (1) Data Points:  Order Aims Assessment (2) Review of medication regiment & side effects (2) Review of new medications or change in dosage (2)  I certify that inpatient services furnished can reasonably be expected to improve the patient's condition.   ACorena Pilgrim MD 01/03/2014, 3:22 PM

## 2014-01-03 NOTE — Progress Notes (Signed)
Patient ID: Kathleen Mcfarland, female   DOB: 07/10/1960, 53 y.o.   MRN: [id number]  Patient asleep; no s/s of distress noted. Respirations regular and unlabored. Q 15 minute safety checks maintained.

## 2014-01-04 MED ORDER — ENSURE COMPLETE PO LIQD: 237.0000 mL | Freq: Two times a day (BID) | ORAL | Status: DC

## 2014-01-04 MED ORDER — ARIPIPRAZOLE 10 MG PO TABS
10.0000 mg | ORAL_TABLET | Freq: Every day | ORAL | Status: DC
  Administered 2014-01-04 – 2014-01-06 (×3): 10 mg via ORAL
  Filled 2014-01-04 (×5): qty 1
  Filled 2014-01-04: qty 14

## 2014-01-04 NOTE — Progress Notes (Signed)
Patient ID: Kathleen Mcfarland, female   DOB: 04/26/1961, 53 y.o.   MRN: [id number] [facility] MD Progress Note  01/04/2014 12:17 PM Kathleen Mcfarland  MRN:  [id number] Subjective:  "I refused Zoloft this morning because it has been making me irritable. By the way, I am still hearing voices and feeling suicidal.''  Objective: Patient was seen and chart reviewed. She reports worsening irritable mood, command auditory hallucination, hearing voices telling her to hurt herself, feeling hopeless, worthless, depressed and suicidal. She denies mood swings, visual hallucinations or homicidal thoughts. Patient is compliant with the unit milieu and her medications. She has not verbalized any adverse reactions to her medications.  Diagnosis:   DSM5: Schizophrenia Disorders:  Psychotic Disorder (298.8) Obsessive-Compulsive Disorders:   Trauma-Stressor Disorders:   Substance/Addictive Disorders:   Depressive Disorders:  Major Depressive Disorder - with Psychotic Features (296.24) Total Time spent with patient: 25 minutes  Axis I: Major depressive disorder, recurrent episode, severe, specified as with psychotic behavior  Axis II: Deferred Axis III:  Past Medical History  Diagnosis Date  . Allergic rhinitis   . Hypothyroidism   . Hypertension   . Osteoarthritis of knee   . Osteoarthritis 2012    bilateral knees  . Complication of anesthesia     hard to wake up  . Back pain   . Depression   . Anxiety    Axis IV: other psychosocial or environmental problems and problems related to social environment  ADL's:  Intact  Sleep: Fair  Appetite:  Fair  Suicidal Ideation: yes Plan:  denies Homicidal Ideation:  denies AEB (as evidenced by):  Psychiatric Specialty Exam: Physical Exam  Review of Systems  Constitutional: Negative.   HENT: Negative.   Eyes: Negative.   Respiratory: Negative.   Cardiovascular: Negative.   Gastrointestinal: Negative.   Genitourinary: Negative.   Musculoskeletal:  Positive for myalgias.  Skin: Negative.   Neurological: Negative.   Endo/Heme/Allergies: Negative.   Psychiatric/Behavioral: Positive for depression, suicidal ideas and hallucinations. The patient is nervous/anxious and has insomnia.     Blood pressure 126/83, pulse 101, temperature 98 F (36.7 C), temperature source Oral, resp. rate 19, height 5\' 9"  (1.753 m), weight 94.802 kg (209 lb), last menstrual period 01/01/2014, SpO2 100.00%.Body mass index is 30.85 kg/(m^2).  General Appearance: fairly groomed  Engineer, water::  Minimal  Speech:  Clear and Coherent and Slow  Volume:  Decreased  Mood:  Depressed and Dysphoric  Affect:  Constricted  Thought Process:  Goal Directed  Orientation:  Full (Time, Place, and Person)  Thought Content:  Hallucinations: Auditory  Suicidal Thoughts:  Yes.  without intent/plan  Homicidal Thoughts:  No  Memory:  Immediate;   Fair Recent;   Fair Remote;   Fair  Judgement:  Impaired  Insight:  Lacking  Psychomotor Activity:  Decreased  Concentration:  Fair  Recall:  [employer] of Knowledge:Fair  Language: Fair  Akathisia:  No  Handed:  Right  AIMS (if indicated):     Assets:  Communication Skills Desire for Improvement Physical Health  Sleep:  Number of Hours: 6.25   Musculoskeletal: Strength & Muscle Tone: within normal limits Gait & Station: normal Patient leans: N/A  Current Medications: Current Facility-Administered Medications  Medication Dose Route Frequency Provider Last Rate Last Dose  . alum & mag hydroxide-simeth (MAALOX/MYLANTA) 200-200-20 MG/5ML suspension 30 mL  30 mL Oral Q4H PRN Ursula Alert, MD   30 mL at 01/04/14 0629  . ARIPiprazole (ABILIFY) tablet 10 mg  10  mg Oral QHS Katrina Brosh      . cholecalciferol (VITAMIN D) tablet 1,000 Units  1,000 Units Oral Daily Ursula Alert, MD   1,000 Units at 01/04/14 0747  . cyclobenzaprine (FLEXERIL) tablet 5 mg  5 mg Oral TID PRN Ursula Alert, MD   5 mg at 01/03/14 2137  . feeding  supplement (ENSURE COMPLETE) (ENSURE COMPLETE) liquid 237 mL  237 mL Oral BID BM Saramma Eappen, MD      . ibuprofen (ADVIL,MOTRIN) tablet 400 mg  400 mg Oral Q6H PRN Ursula Alert, MD   400 mg at 01/03/14 1951  . levothyroxine (SYNTHROID, LEVOTHROID) tablet 175 mcg  175 mcg Oral QAC breakfast Ursula Alert, MD   175 mcg at 01/04/14 0630  . magnesium hydroxide (MILK OF MAGNESIA) suspension 30 mL  30 mL Oral Daily PRN Saramma Eappen, MD      . montelukast (SINGULAIR) tablet 10 mg  10 mg Oral Daily PRN Ursula Alert, MD      . multivitamin with minerals tablet 1 tablet  1 tablet Oral Daily Ursula Alert, MD   1 tablet at 01/04/14 0747  . traZODone (DESYREL) tablet 50 mg  50 mg Oral QHS Ursula Alert, MD   50 mg at 01/03/14 2235  . triamterene-hydrochlorothiazide (MAXZIDE-25) 37.5-25 MG per tablet 1 tablet  1 tablet Oral Daily Ursula Alert, MD   1 tablet at 01/04/14 0748    Lab Results:  No results found for this or any previous visit (from the past 48 hour(s)).  Physical Findings: AIMS:  , ,  ,  ,    CIWA:    COWS:     Treatment Plan Summary: Daily contact with patient to assess and evaluate symptoms and progress in treatment Medication management  Plan:1. Admit for crisis management and stabilization. 2. Medication management to reduce current symptoms to base line and improve the  patient's overall level of functioning: Will discontinue Zoloft per patient's request. Will  Increase  Abilify to 10 mg po qhs to augment the antidepressant as well as help with psychosis.  Will continue Trazodone 50 mg po qhs for sleep.  Will continue Maxzide 25 mg po for her HTN  Will continue Synthroid 13mcg for hypothyroidism.  Will continue flexeril for muscle spasm as scheduled. 3. Treat health problems as indicated. 4. Develop treatment plan to decrease risk of relapse upon discharge and the need for  readmission. 5. Psycho-social education regarding relapse prevention and self care. 6.  Health care follow up as needed for medical problems. 7. Restart home medications where appropriate.   Medical Decision Making Problem Points:  Established problem, worsening (2), Review of last therapy session (1) and Review of psycho-social stressors (1) Data Points:  Order Aims Assessment (2) Review of medication regiment & side effects (2) Review of new medications or change in dosage (2)  I certify that inpatient services furnished can reasonably be expected to improve the patient's condition.   Corena Pilgrim, MD 01/04/2014, 12:17 PM

## 2014-01-04 NOTE — [facility] Group Notes (Signed)
[place] Group Notes:  (Clinical Social Work)  01/04/2014   11:15am-12:00pm  Summary of Progress/Problems:  The main focus of today's process group was to listen to a variety of genres of music and to identify that different types of music provoke different responses.  The patient then was able to identify personally what was soothing for them, as well as energizing.  \ The patient expressed understanding of concepts, as well as knowledge of how each type of music affected him/her and how this can be used at home as a wellness/recovery tool.  She also was in tune with the emotions that she saw expressed on the faces of other patients, and expressed concern about them at times.  Type of Therapy:  Music Therapy   Participation Level:  Active  Participation Quality:  Attentive and Sharing and supportive  Affect:  Blunted  Cognitive:  Oriented  Insight:  Engaged  Engagement in Therapy:  Engaged  Modes of Intervention:   Activity, Exploration  Selmer Dominion, LCSW 01/04/2014, 12:30pm

## 2014-01-04 NOTE — Plan of Care (Signed)
Problem: Ineffective individual coping Goal: STG: Patient will remain free from self harm Outcome: Progressing Pt has remained free from self harm this shift.    Problem: Diagnosis: Increased Risk For Suicide Attempt Goal: STG-Patient Will Attend All Groups On The Unit Outcome: Progressing Pt attended evening group on 01/03/14. Goal: STG-Patient Will Comply With Medication Regime Outcome: Progressing Pt has been compliant with all medications this shift.

## 2014-01-04 NOTE — Progress Notes (Signed)
Pt stated that she had a fair day. Not too good not too bad. Her goal is to keep having faith in God. The first thing she plans to do when she gets home is to attend a water aerobics class.

## 2014-01-04 NOTE — Progress Notes (Signed)
D: Pt presents with depressed affect and mood.  Pt reported "my goal for the day was to find hope, I'm still feeling depressed though."  Pt denies SI,HI, and hallucinations.  Pt interacts appropriately with staff and peers.  Attended evening group.  A: Supported and encouraged pt.  Safety maintained.  Medications administered per order.  R:  Pt verbally contracts for safety and is compliant with medications and unit routine.  Pt is in no distress.  Will continue to monitor and assess for safety.

## 2014-01-04 NOTE — Progress Notes (Signed)
Patient ID: Kathleen Mcfarland, female   DOB: [birth year], 53 y.o.   MRN: [id number] D: Patient continues to feel depressed.  She presents with flat affect; sad mood.  She continues to hear command voices telling her to hurt herself.  She complains of continuing mood lability.  She is attending groups with minimal participation. A: Continue to monitor medication management and MD orders.  Safety checks completed every 15 minutes per protocol. R: Patient is receptive to staff; her behavior is appropriate.

## 2014-01-04 NOTE — [facility] Group Notes (Signed)
[name]:  (Nursing/MHT/Case Management/Adjunct)  Date:  01/04/2014  Time:  0920 and 0940 am  Type of Therapy:  Psychoeducational Skills  Participation Level:  Active  Participation Quality:  Appropriate  Affect:  Irritable  Cognitive:  Alert  Insight:  Improving  Engagement in Group:  Lacking  Modes of Intervention:  Support  Summary of Progress/Problems:  Kathleen Mcfarland 01/04/2014, 1:46 PM

## 2014-01-05 MED ORDER — CITALOPRAM HYDROBROMIDE 10 MG PO TABS
10.0000 mg | ORAL_TABLET | Freq: Every day | ORAL | Status: DC
  Administered 2014-01-05 – 2014-01-06 (×2): 10 mg via ORAL
  Filled 2014-01-05 (×4): qty 1

## 2014-01-05 MED ORDER — ENSURE COMPLETE PO LIQD
237.0000 mL | Freq: Three times a day (TID) | ORAL | Status: DC
  Administered 2014-01-06 – 2014-01-07 (×4): 237 mL via ORAL

## 2014-01-05 MED ORDER — TRAZODONE HCL 100 MG PO TABS
100.0000 mg | ORAL_TABLET | Freq: Every day | ORAL | Status: DC
  Administered 2014-01-05 – 2014-01-06 (×2): 100 mg via ORAL
  Filled 2014-01-05 (×2): qty 1
  Filled 2014-01-05: qty 14
  Filled 2014-01-05: qty 1

## 2014-01-05 MED ORDER — BOOST PLUS PO LIQD: 237.0000 mL | Freq: Three times a day (TID) | ORAL | Status: DC

## 2014-01-05 NOTE — Plan of Care (Signed)
Problem: Diagnosis: Increased Risk For Suicide Attempt Goal: STG-Patient Will Attend All Groups On The Unit Outcome: Progressing Pt attended evening group on 01/04/14. Goal: STG-Patient Will Report Suicidal Feelings to Staff Outcome: Progressing Pt agreed to notify staff if thinking of harming self.   Goal: STG-Patient Will Comply With Medication Regime Outcome: Progressing Pt was compliant with all HS medications on 01/04/14.

## 2014-01-05 NOTE — Progress Notes (Signed)
Patient ID: Kathleen Mcfarland, female   DOB: 09-25-1960, 53 y.o.   MRN: [id number] [facility] MD Progress Note  01/05/2014 11:15 AM Kathleen Mcfarland  MRN:  [id number] Subjective:  "I am still depressed and am concerned about not having an insurance to take care of my medical issues:  Objective: Patient was seen and chart reviewed. She reports feeling a little better today ,continues to have sadness,with labile affect. Reports sleep as on and off. Reports that Zoloft did not make her feel good and hence was stopped over the weekend. Reports that she continues to be anxious about her legal issues as well as her not having insurance. Endorses passive SI ,but reports feeling better ,not having a plan. Denies AH today .  She denies mood swings, visual hallucinations or homicidal thoughts. Patient is compliant with the unit milieu and her other  medications.   Diagnosis:   DSM5:  Primary psychiatric diagnosis:  Major depressive disorder,recurrent ,severe with psychosis   Secondary psychiatric diagnosis:  Anxiety disorder unspecified   Non psychiatric diagnosis:  Osteoarthritis  HTN  Hypothyroidism  Chronic back pain  Rotator cuff surgeries     Total Time spent with patient: 25 minutes   Past Medical History  Diagnosis Date  . Allergic rhinitis   . Hypothyroidism   . Hypertension   . Osteoarthritis of knee   . Osteoarthritis 2012    bilateral knees  . Complication of anesthesia     hard to wake up  . Back pain   . Depression   . Anxiety      ADL's:  Intact  Sleep: Fair  Appetite:  Fair  Suicidal Ideation: yes Has passive SI ,no plan ,.contracts for safety Homicidal Ideation:  denies AEB (as evidenced by):  Psychiatric Specialty Exam: Physical Exam  Review of Systems  Constitutional: Negative.   HENT: Negative.   Eyes: Negative.   Respiratory: Negative.   Cardiovascular: Negative.   Gastrointestinal: Negative.   Genitourinary: Negative.   Musculoskeletal: Positive  for myalgias.  Skin: Negative.   Neurological: Negative.   Endo/Heme/Allergies: Negative.   Psychiatric/Behavioral: Positive for depression and suicidal ideas. The patient is nervous/anxious and has insomnia.     Blood pressure 112/68, pulse 104, temperature 97.9 F (36.6 C), temperature source Oral, resp. rate 17, height 5\' 9"  (1.753 m), weight 94.802 kg (209 lb), last menstrual period 01/01/2014, SpO2 100.00%.Body mass index is 30.85 kg/(m^2).  General Appearance: fairly groomed  Engineer, water::  Minimal  Speech:  Clear and Coherent and Slow  Volume:  Decreased  Mood:  Depressed and Dysphoric  Affect:  Labile  Thought Process:  Goal Directed  Orientation:  Full (Time, Place, and Person)  Thought Content:  Rumination  Suicidal Thoughts:  Yes.  without intent/plan passive  Homicidal Thoughts:  No  Memory:  Immediate;   Fair Recent;   Fair Remote;   Fair  Judgement:  Impaired  Insight:  Lacking  Psychomotor Activity:  Decreased  Concentration:  Fair  Recall:  [employer] of Knowledge:Fair  Language: Fair  Akathisia:  No  Handed:  Right  AIMS (if indicated):     Assets:  Communication Skills Desire for Improvement Physical Health  Sleep:  Number of Hours: 6.5   Musculoskeletal: Strength & Muscle Tone: within normal limits Gait & Station: normal Patient leans: N/A  Current Medications: Current Facility-Administered Medications  Medication Dose Route Frequency Provider Last Rate Last Dose  . alum & mag hydroxide-simeth (MAALOX/MYLANTA) 200-200-20 MG/5ML suspension 30 mL  30 mL Oral Q4H PRN Ursula Alert, MD   30 mL at 01/04/14 0629  . ARIPiprazole (ABILIFY) tablet 10 mg  10 mg Oral QHS Mojeed Akintayo   10 mg at 01/04/14 2155  . cholecalciferol (VITAMIN D) tablet 1,000 Units  1,000 Units Oral Daily Ursula Alert, MD   1,000 Units at 01/05/14 0804  . citalopram (CELEXA) tablet 10 mg  10 mg Oral Daily Daiwik Buffalo, MD      . cyclobenzaprine (FLEXERIL) tablet 5 mg  5 mg  Oral TID PRN Ursula Alert, MD   5 mg at 01/04/14 2012  . feeding supplement (ENSURE COMPLETE) (ENSURE COMPLETE) liquid 237 mL  237 mL Oral BID BM Man Bonneau, MD      . ibuprofen (ADVIL,MOTRIN) tablet 400 mg  400 mg Oral Q6H PRN Ursula Alert, MD   400 mg at 01/04/14 1616  . levothyroxine (SYNTHROID, LEVOTHROID) tablet 175 mcg  175 mcg Oral QAC breakfast Ursula Alert, MD   175 mcg at 01/05/14 0648  . magnesium hydroxide (MILK OF MAGNESIA) suspension 30 mL  30 mL Oral Daily PRN Dorella Laster, MD      . montelukast (SINGULAIR) tablet 10 mg  10 mg Oral Daily PRN Ursula Alert, MD      . multivitamin with minerals tablet 1 tablet  1 tablet Oral Daily Ursula Alert, MD   1 tablet at 01/05/14 0804  . traZODone (DESYREL) tablet 100 mg  100 mg Oral QHS Clovis Mankins, MD      . triamterene-hydrochlorothiazide (MAXZIDE-25) 37.5-25 MG per tablet 1 tablet  1 tablet Oral Daily Ursula Alert, MD   1 tablet at 01/05/14 0804    Lab Results:  No results found for this or any previous visit (from the past 48 hour(s)).  Physical Findings: AIMS:  , ,  ,  ,    CIWA:    COWS:     Treatment Plan Summary: Daily contact with patient to assess and evaluate symptoms and progress in treatment Medication management  Plan: 1. Continue inpatient treatment and stabilization. 2. Medication management to reduce current symptoms to base line and improve the  patient's overall level of functioning: Will start Celexa 10 mg po daily for depression. Patient's zoloft was DCed over the weekend since she developed side effects. Patient is sensitive to medications and will titrate the dose up slowly. Will  Continue  Abilify to 10 mg po qhs to augment the antidepressant as well as help with psychosis.  Will incresae Trazodone to 100 mg po qhs for sleep.  Will continue Maxzide 25 mg po for her HTN  Will continue Synthroid 110mcg for hypothyroidism.  Will continue flexeril for muscle spasm as scheduled. 3. Treat  health problems as indicated. 4. Develop treatment plan to decrease risk of relapse upon discharge and the need for  readmission. 5. Psycho-social education regarding relapse prevention and self care. 6. Health care follow up as needed for medical problems. 7. Restart home medications where appropriate.   Medical Decision Making Problem Points:  Established problem, worsening (2), Review of last therapy session (1) and Review of psycho-social stressors (1) Data Points:  Order Aims Assessment (2) Review of medication regiment & side effects (2) Review of new medications or change in dosage (2)  I certify that inpatient services furnished can reasonably be expected to improve the patient's condition.   Presley Summerlin, MD 01/05/2014, 11:15 AM

## 2014-01-05 NOTE — Progress Notes (Signed)
Patient ID: Kathleen Mcfarland, female   DOB: January 29, 1961, 53 y.o.   MRN: [id number] D: Patient reports decreased depressive symptoms and reports passive SI.  She continues to have mood lability.  Patient has been attending groups and participating in her treatment.  She denies any HI/AVH.  Patient has been interacting well with staff and her peers. A: Continue to monitor medication management and MD orders.  Safety checks completed every 15 minutes per protocol. R: Patient is receptive to staff; her behavior is appropriate.

## 2014-01-05 NOTE — [facility] Suicide Risk Assessment (Signed)
[place] INPATIENT:  Family/Significant Other Suicide Prevention Education  Suicide Prevention Education:  Contact Attempts: Alanson Aly (pt's daughter) [phone] has been identified by the patient as the family member/significant other with whom the patient will be residing, and identified as the person(s) who will aid the patient in the event of a mental health crisis.  With written consent from the patient, two attempts were made to provide suicide prevention education, prior to and/or following the patient's discharge.  We were unsuccessful in providing suicide prevention education.  A suicide education pamphlet was given to the patient to share with family/significant other.  Date and time of first attempt: 01/05/14 12:35PM (generic voicemail left requesting call back at her earliest convenience)  Date and time of second attempt:01/07/14 10:30AM -no answer. Second vm left.   Smart, Nolie Bignell LCSWA  01/05/2014, 12:39 PM

## 2014-01-05 NOTE — [facility] Group Notes (Signed)
[facility] LCSW Aftercare Discharge Planning Group Note   01/05/2014 11:10 AM  Participation Quality:  Engaged  Mood/Affect:  Depressed  Depression Rating:  6  Anxiety Rating:  7  Thoughts of Suicide:  No Will you contract for safety?   NA  Current AVH:  Yes  Plan for Discharge/Comments:  Kathleen Mcfarland states she is still symptomatic with depression, psychosis and intermittent thoughts of self harm. She states that she has been talking with members of her church, and that has been helpful.  Yesterday AM was a particularly difficult time because she has been singing in the choir since she was young, and she rarely misses. She also wants to talk to me after group.  She told me individually she is concerned about affording the meds, and finding a PCP.  I explained how she can get help with both.  Transportation Means: friends/daughter  Supports: friends/daughter  [employer], [place] B

## 2014-01-05 NOTE — Tx Team (Addendum)
  Interdisciplinary Treatment Plan Update   Date Reviewed:  01/05/2014  Time Reviewed:  12:47 PM  Progress in Treatment:   Attending groups: Yes Participating in groups: Yes Taking medication as prescribed: Yes  Tolerating medication: Yes Family/Significant other contact made: No  Left message for daughter  Patient understands diagnosis: Yes AEB asking for help with depression and psychosis Discussing patient identified problems/goals with staff: Yes  See initial care plan Medical problems stabilized or resolved: Yes Denies suicidal/homicidal ideation: Yes  In tx team Patient has not harmed self or others: Yes  For review of initial/current patient goals, please see plan of care.  Estimated Length of Stay:  3-5 days  Reason for Continuation of Hospitalization: Depression Hallucinations Medication stabilization  New Problems/Goals identified:  N/A  Discharge Plan or Barriers:   return home, follow up outpt  Additional Comments:  "I am still depressed and am concerned about not having an insurance to take care of my medical issues:  She reports feeling a little better today ,continues to have sadness,with labile affect. Reports sleep as on and off. Reports that Zoloft did not make her feel good and hence was stopped over the weekend. Reports that she continues to be anxious about her legal issues as well as her not having insurance. Endorses passive SI ,but reports feeling better ,not having a plan. Denies AH today . CSW talked about [place] will help pt with PAP.  Zoloft d/ced and Celexa initiated today.  Abilify doubled yesterday.   Attendees:  Signature: Steva Colder, MD 01/05/2014 12:47 PM   Signature: Ripley Fraise, [place] 01/05/2014 12:47 PM  Signature:  01/05/2014 12:47 PM  Signature: Mayra Neer, RN 01/05/2014 12:47 PM  Signature:  01/05/2014 12:47 PM  Signature:  01/05/2014 12:47 PM  Signature:   01/05/2014 12:47 PM  Signature:     Signature:    Signature:    Signature:    Signature:    Signature:      Scribe for Treatment Team:   Ripley Fraise, [place]  01/05/2014 12:47 PM

## 2014-01-06 MED ORDER — CITALOPRAM HYDROBROMIDE 20 MG PO TABS
20.0000 mg | ORAL_TABLET | Freq: Every day | ORAL | Status: DC
  Administered 2014-01-07: 20 mg via ORAL
  Filled 2014-01-06 (×2): qty 1
  Filled 2014-01-06: qty 14

## 2014-01-06 MED ORDER — CITALOPRAM HYDROBROMIDE 10 MG PO TABS
10.0000 mg | ORAL_TABLET | Freq: Once | ORAL | Status: AC
  Administered 2014-01-06: 10 mg via ORAL
  Filled 2014-01-06: qty 1

## 2014-01-06 MED ORDER — CITALOPRAM HYDROBROMIDE 10 MG PO TABS
ORAL_TABLET | ORAL | Status: AC
  Filled 2014-01-06: qty 1

## 2014-01-06 NOTE — [facility] Group Notes (Signed)
[facility] LCSW Group Therapy  01/06/2014 1:15 pm  Type of Therapy: Process Group Therapy  Participation Level:  Active  Participation Quality:  Appropriate  Affect:  Flat  Cognitive:  Oriented  Insight:  Improving  Engagement in Group:  Limited  Engagement in Therapy:  Limited  Modes of Intervention:  Activity, Clarification, Education, Problem-solving and Support  Summary of Progress/Problems: Today's group addressed the issue of overcoming obstacles.  Patients were asked to identify their biggest obstacle post d/c that stands in the way of their on-going success, and then problem solve as to how to manage this.  Kathleen Mcfarland was invested in the topic, and in fact was a co-therapist in some ways as she responded with support and challenges to other patients.  She talked about her process of avoiding her obstacles and denying they existed prior to admission, but is now applying the Serenity Prayer to them, and she feels more hopeful.  Also talked about how she has been carrying the weight of others, and that has made her own burdens overwhelming.  Her plan going forward is to set better limits with others, and not try to change them.  Kathleen Mcfarland B 01/06/2014   12:20 PM

## 2014-01-06 NOTE — Progress Notes (Signed)
D: Patient denies SI/HI and A/V hallucinations; patient reports sleep is good; reports appetite is good; reports energy level is high ; reports ability to concentrate is good; rates depression as 8/10; rates hopelessness 6/10; rates anxiety as 6/10;   A: Monitored q 15 minutes; patient encouraged to attend groups; patient educated about medications; patient given medications per physician orders; patient encouraged to express feelings and/or concerns  R: Patient is cooperative and pleasant; patient is a little hesitant about medications but she is taking medication as prescribed; patient's interaction with staff and peers is appropriate and engages well; patient was able to set goal to talk with staff 1:1 when having feelings of SI;  patient is attending all groups and engaging well

## 2014-01-06 NOTE — Progress Notes (Signed)
D: Pt denies SI/HI/AVH. Pt is pleasant and cooperative. Pt stated she has been steadily improving.    A: Pt was offered support and encouragement. Pt was given scheduled medications. Pt was encourage to attend groups. Q 15 minute checks were done for safety.   R:Pt attends groups and interacts well with peers and staff. Pt is taking medication. Pt has no complaints at this time.Pt receptive to treatment and safety maintained on unit.

## 2014-01-06 NOTE — Progress Notes (Signed)
Adult Psychoeducational Group Note  Date:  01/06/2014 Time:  10:17 PM  Group Topic/Focus:  Wrap-Up Group:   The focus of this group is to help patients review their daily goal of treatment and discuss progress on daily workbooks.  Participation Level:  Active  Participation Quality:  Appropriate  Affect:  Appropriate  Cognitive:  Appropriate  Insight: Appropriate  Engagement in Group:  Engaged  Modes of Intervention:  Socialization and Support  Additional Comments:  Patient attended and participated in group tonight. She reports that she had a great day. She went outside, went for meals, attended her groups and her daughter visited with her. She advised that today's topic for groups was recovery. She learnt that she will just have to accept things that she cannot change to deal with the problems head on and to have hope. She would like to stay in support of others.  Kathleen Mcfarland [facility] 01/06/2014, 10:17 PM

## 2014-01-06 NOTE — Progress Notes (Signed)
D: Pt reports having a "breakdown" earlier that she has since been relieved from. She is denying any SI/HI/AVH. Pt is active within the milieu. Pt appropriately interacts with others. Pt was provided with a print-out on Abilify. Prescribed indications were verbalized to this pt.  A: Writer administered scheduled medications to pt. Continued support and availability as needed was extended to this pt. Staff continue to monitor pt with q47min checks.  R: No adverse drug reactions noted. Pt receptive to treatment. Pt remains safe at this time.     Pt requested for her daughter to take home her jewelry. Pt reports having a ring, watch, bracelet, and a pair of earrings. The belongings sheet included a large pair of silver colored earrings and a pink and silver colored ring. Daughter went through pt's bag to retrieve the watch and bracelet. No watch or bracelet was found. Pt's daughter did take home her mom's ring that was secured in the safe envelope. Security present during the removal of the ring. Writer and Pt's daughter signed the valuables folder sheet.  Writer informed the pt that a watch and bracelet was not included in her belongings bag. This pt was escorted to her locker. The Admitting RN, patient, security guard, and this Writer was present in the search room.

## 2014-01-06 NOTE — Progress Notes (Signed)
The focus of this group is to educate the patient on the purpose and policies of crisis stabilization and provide a format to answer questions about their admission.  The group details unit policies and expectations of patients while admitted. Patient attended this group reports that her goal is to hope and makes a list and finds different groups.

## 2014-01-06 NOTE — Progress Notes (Signed)
Patient ID: Kathleen Mcfarland, female   DOB: August 25, 1960, 53 y.o.   MRN: [id number] [facility] MD Progress Note  01/06/2014 10:56 AM LACONDA BASICH  MRN:  [id number] Subjective:  "I am feeling better ,but did not sleep because of my room mate.  Objective: Patient was seen and chart reviewed. She appears to more pleasant today ,reports that her depression is at a 9/10 today ,but she is more positive. She reports sleep as restless last night ,since her room mate was very intrusive and going through her stuff. Reports good effect from her celexa. She denies any SI today. Denies any AH/VH/HI today.  Patient is compliant with the unit milieu and her other  medications.   Per staff she has been compliant with medications ,has been attending groups ,did complain of sleep issues last night.  Diagnosis:   DSM5:  Primary psychiatric diagnosis:  Major depressive disorder,recurrent ,severe with psychosis   Secondary psychiatric diagnosis:  Anxiety disorder unspecified   Non psychiatric diagnosis:  Osteoarthritis  HTN  Hypothyroidism  Chronic back pain  Rotator cuff surgeries     Total Time spent with patient: 25 minutes   Past Medical History  Diagnosis Date  . Allergic rhinitis   . Hypothyroidism   . Hypertension   . Osteoarthritis of knee   . Osteoarthritis 2012    bilateral knees  . Complication of anesthesia     hard to wake up  . Back pain   . Depression   . Anxiety      ADL's:  Intact  Sleep: Poor  Appetite:  Fair  Suicidal Ideation: denies  Homicidal Ideation:  denies AEB (as evidenced by):  Psychiatric Specialty Exam: Physical Exam  Review of Systems  Constitutional: Negative.   HENT: Negative.   Eyes: Negative.   Respiratory: Negative.   Cardiovascular: Negative.   Gastrointestinal: Negative.   Genitourinary: Negative.   Musculoskeletal: Positive for myalgias.  Skin: Negative.   Neurological: Negative.   Endo/Heme/Allergies: Negative.    Psychiatric/Behavioral: Positive for depression. The patient is nervous/anxious and has insomnia.     Blood pressure 128/82, pulse 101, temperature 97.8 F (36.6 C), temperature source Oral, resp. rate 20, height 5\' 9"  (1.753 m), weight 94.802 kg (209 lb), last menstrual period 01/01/2014, SpO2 100.00%.Body mass index is 30.85 kg/(m^2).  General Appearance: fairly groomed  Engineer, water::  Minimal  Speech:  Clear and Coherent and Slow  Volume:  Normal  Mood:  Depressed -improving  Affect:  Labile  Thought Process:  Goal Directed  Orientation:  Full (Time, Place, and Person)  Thought Content:  Rumination  Suicidal Thoughts:  No   Homicidal Thoughts:  No  Memory:  Immediate;   Fair Recent;   Fair Remote;   Fair  Judgement:  Impaired  Insight:  Lacking  Psychomotor Activity:  Decreased  Concentration:  Fair  Recall:  [employer] of Knowledge:Fair  Language: Fair  Akathisia:  No  Handed:  Right  AIMS (if indicated):     Assets:  Communication Skills Desire for Improvement Physical Health  Sleep:  Number of Hours: 5.5   Musculoskeletal: Strength & Muscle Tone: within normal limits Gait & Station: normal Patient leans: N/A  Current Medications: Current Facility-Administered Medications  Medication Dose Route Frequency Provider Last Rate Last Dose  . alum & mag hydroxide-simeth (MAALOX/MYLANTA) 200-200-20 MG/5ML suspension 30 mL  30 mL Oral Q4H PRN Ursula Alert, MD   30 mL at 01/04/14 0629  . ARIPiprazole (ABILIFY) tablet 10 mg  10 mg Oral QHS Mojeed Akintayo   10 mg at 01/05/14 2157  . cholecalciferol (VITAMIN D) tablet 1,000 Units  1,000 Units Oral Daily Ursula Alert, MD   1,000 Units at 01/06/14 0805  . citalopram (CELEXA) tablet 10 mg  10 mg Oral Once Ursula Alert, MD      . Derrill Memo ON 01/07/2014] citalopram (CELEXA) tablet 20 mg  20 mg Oral Daily Jeane Cashatt, MD      . cyclobenzaprine (FLEXERIL) tablet 5 mg  5 mg Oral TID PRN Ursula Alert, MD   5 mg at 01/04/14 2012   . feeding supplement (ENSURE COMPLETE) (ENSURE COMPLETE) liquid 237 mL  237 mL Oral TID WC Francee Setzer, MD      . ibuprofen (ADVIL,MOTRIN) tablet 400 mg  400 mg Oral Q6H PRN Ursula Alert, MD   400 mg at 01/04/14 1616  . levothyroxine (SYNTHROID, LEVOTHROID) tablet 175 mcg  175 mcg Oral QAC breakfast Ursula Alert, MD   175 mcg at 01/06/14 6294  . magnesium hydroxide (MILK OF MAGNESIA) suspension 30 mL  30 mL Oral Daily PRN Phelix Fudala, MD      . montelukast (SINGULAIR) tablet 10 mg  10 mg Oral Daily PRN Ursula Alert, MD      . multivitamin with minerals tablet 1 tablet  1 tablet Oral Daily Ursula Alert, MD   1 tablet at 01/06/14 0805  . traZODone (DESYREL) tablet 100 mg  100 mg Oral QHS Ursula Alert, MD   100 mg at 01/05/14 2157  . triamterene-hydrochlorothiazide (MAXZIDE-25) 37.5-25 MG per tablet 1 tablet  1 tablet Oral Daily Ursula Alert, MD   1 tablet at 01/06/14 0805    Lab Results:  No results found for this or any previous visit (from the past 48 hour(s)).  Physical Findings: AIMS:  , ,  ,  ,    CIWA:    COWS:     Treatment Plan Summary: Daily contact with patient to assess and evaluate symptoms and progress in treatment Medication management  Plan: 1. Continue inpatient treatment and stabilization. 2. Medication management to reduce current symptoms to base line and improve the  patient's overall level of functioning: Will increase Celexa to 20 mg po daily for depression. Patient is sensitive to medications and will titrate the dose up slowly. Will  Continue  Abilify 10 mg po qhs to augment the antidepressant as well as help with psychosis.  Will continue trazodone 100 mg po qhs for sleep.  Will continue Maxzide 25 mg po for her HTN  Will continue Synthroid 145mcg for hypothyroidism.  Will continue flexeril for muscle spasm as scheduled. 3. Treat health problems as indicated. 4. Develop treatment plan to decrease risk of relapse upon discharge and the need  for  readmission. 5. Psycho-social education regarding relapse prevention and self care. 6. Health care follow up as needed for medical problems. 7. Restart home medications where appropriate.   Medical Decision Making Problem Points:  Established problem, worsening (2), Review of last therapy session (1) and Review of psycho-social stressors (1) Data Points:  Order Aims Assessment (2) Review of medication regiment & side effects (2) Review of new medications or change in dosage (2)  I certify that inpatient services furnished can reasonably be expected to improve the patient's condition.   Earnie Bechard, MD 01/06/2014, 10:56 AM

## 2014-01-07 MED ORDER — TRAZODONE HCL 100 MG PO TABS: 100.0000 mg | ORAL_TABLET | Freq: Every day | ORAL | Status: DC

## 2014-01-07 MED ORDER — CITALOPRAM HYDROBROMIDE 20 MG PO TABS: 20.0000 mg | ORAL_TABLET | Freq: Every day | ORAL | Status: DC

## 2014-01-07 MED ORDER — ARIPIPRAZOLE 10 MG PO TABS: 10.0000 mg | ORAL_TABLET | Freq: Every day | ORAL | Status: DC

## 2014-01-07 MED ORDER — VITAMIN D 1000 UNITS PO TABS: 1000.0000 [IU] | ORAL_TABLET | Freq: Every day | ORAL | Status: DC

## 2014-01-07 MED ORDER — MULTIVITAMINS PO TABS: 1.0000 | ORAL_TABLET | Freq: Every day | ORAL | Status: DC

## 2014-01-07 MED ORDER — CYCLOBENZAPRINE HCL 5 MG PO TABS
5.0000 mg | ORAL_TABLET | Freq: Three times a day (TID) | ORAL | Status: DC | PRN
Start: 2014-01-07 — End: 2014-01-30

## 2014-01-07 MED ORDER — LEVOTHYROXINE SODIUM 175 MCG PO TABS: 175.0000 ug | ORAL_TABLET | Freq: Every day | ORAL | Status: DC

## 2014-01-07 MED ORDER — MOMETASONE FUROATE 50 MCG/ACT NA SUSP: 2.0000 | Freq: Every day | NASAL | Status: DC

## 2014-01-07 MED ORDER — TRIAMTERENE-HCTZ 37.5-25 MG PO TABS: 1.0000 | ORAL_TABLET | Freq: Every day | ORAL | Status: DC

## 2014-01-07 NOTE — Plan of Care (Signed)
Problem: Alteration in thought process Goal: LTG-Patient verbalizes understanding importance med regimen (Patient verbalizes understanding of importance of medication regimen and need to continue outpatient care.)  Outcome: Completed/Met Date Met:  01/07/14 Pt stated she will take her medications when she leaves [facility] Goal: STG-Patient is able to discuss thoughts with staff Outcome: Progressing Pt stated she was feeling better today. Pt stated she thought she was improving.

## 2014-01-07 NOTE — [facility] Suicide Risk Assessment (Signed)
Demographic Factors:  Divorced or widowed  Total Time spent with patient: 20 minutes  Psychiatric Specialty Exam: Physical Exam  Constitutional: She is oriented to person, place, and time. She appears well-developed and well-nourished.  HENT:  Head: Normocephalic and atraumatic.  Eyes: Pupils are equal, round, and reactive to light.  Neck: Normal range of motion.  Cardiovascular: Normal rate.   Respiratory: Effort normal.  Neurological: She is alert and oriented to person, place, and time.  Psychiatric: She has a normal mood and affect. Her speech is normal and behavior is normal. Judgment normal. Cognition and memory are normal. She expresses no homicidal and no suicidal ideation. She expresses no suicidal plans and no homicidal plans.    Review of Systems  Constitutional: Negative.   HENT: Negative.   Respiratory: Negative.   Gastrointestinal: Negative.   Skin: Negative.   Neurological: Negative.   Psychiatric/Behavioral: Negative for suicidal ideas and hallucinations.    Blood pressure 136/75, pulse 112, temperature 98.1 F (36.7 C), temperature source Oral, resp. rate 20, height 5\' 9"  (1.753 m), weight 94.802 kg (209 lb), last menstrual period 01/01/2014, SpO2 100.00%.Body mass index is 30.85 kg/(m^2).  General Appearance: Casual  Eye Contact::  Good  Speech:  Clear and Coherent  Volume:  Normal  Mood:  Euthymic  Affect:  Congruent  Thought Process:  Goal Directed  Orientation:  Full (Time, Place, and Person)  Thought Content:  Denies AH/VH  Suicidal Thoughts:  No  Homicidal Thoughts:  No  Memory:  Immediate;   Good Recent;   Good Remote;   Good  Judgement:  Intact  Insight:  Fair  Psychomotor Activity:  Normal  Concentration:  Good  Recall:  Good  Fund of Knowledge:Fair  Language: Fair  Akathisia:  No    AIMS (if indicated):   0  Assets:  Communication Skills  Sleep:  Number of Hours: 6.5    Musculoskeletal: Strength & Muscle Tone: within normal  limits Gait & Station: normal Patient leans: N/A   Mental Status Per Nursing Assessment::   On Admission:  Self-harm thoughts  Current Mental Status by Physician: Denies AH/VH/SI/HI  Loss Factors: Legal issues  Historical Factors: Prior suicide attempts and Family history of mental illness or substance abuse  Risk Reduction Factors:   Religious beliefs about death, Positive social support and Positive therapeutic relationship  Continued Clinical Symptoms:  Previous Psychiatric Diagnoses and Treatments  Cognitive Features That Contribute To Risk:  Closed-mindedness    Suicide Risk:  Minimal: No identifiable suicidal ideation.  Patients presenting with no risk factors but with morbid ruminations; may be classified as minimal risk based on the severity of the depressive symptoms  Discharge Diagnoses:  DSM5:  Primary psychiatric diagnosis:  Major depressive disorder,recurrent ,severe with psychosis (resolving)  Secondary psychiatric diagnosis:  Anxiety disorder unspecified   Non psychiatric diagnosis:  Osteoarthritis  HTN  Hypothyroidism  Chronic back pain  Rotator cuff surgeries      Past Medical History  Diagnosis Date  . Allergic rhinitis   . Hypothyroidism   . Hypertension   . Osteoarthritis of knee   . Osteoarthritis 2012    bilateral knees  . Complication of anesthesia     hard to wake up  . Back pain   . Depression   . Anxiety     Plan Of Care/Follow-up recommendations:  Activity:  No restrictions  Is patient on multiple antipsychotic therapies at discharge:  No   Has Patient had three or more failed trials of  antipsychotic monotherapy by history:  No  Recommended Plan for Multiple Antipsychotic Therapies: NA    Esbeidy Mclaine 01/07/2014, 8:38 AM

## 2014-01-07 NOTE — Progress Notes (Signed)
[facility] Adult Case Management Discharge Plan :  Will you be returning to the same living situation after discharge: Yes,  home At discharge, do you have transportation home?:Yes,  family Do you have the ability to pay for your medications:Yes,  mental health  Release of information consent forms completed and in the chart;  Patient's signature needed at discharge.  Patient to Follow up at: Follow-up Information   Follow up with [place]     On 01/13/2014. (Tuesday at [facility])    Contact information:   [place] [phone]      Follow up with [facility]. (Go tothe walk-in clinic M-F between 8 and 10 AM for your hospital follow up appointment.  this is where you will see a psychiatrist.  They also have a pharmacy, and will work with you with the [place] [facility] to get your medications for free.)    Contact information:   [place]      Follow up with [place].   Contact information:   [place] [address]      Patient denies SI/HI:   Yes,  yes    Safety Planning and Suicide Prevention discussed:  Yes,  yes  Roque Lias B 01/07/2014, 10:26 AM

## 2014-01-07 NOTE — Tx Team (Signed)
  Interdisciplinary Treatment Plan Update   Date Reviewed:  01/07/2014  Time Reviewed:  10:22 AM  Progress in Treatment:   Attending groups: Yes Participating in groups: Yes Taking medication as prescribed: Yes  Tolerating medication: Yes Family/Significant other contact made: Yes  Patient understands diagnosis: Yes  Discussing patient identified problems/goals with staff: Yes  See initial care plan Medical problems stabilized or resolved: Yes Denies suicidal/homicidal ideation: Yes  In tx team Patient has not harmed self or others: Yes  For review of initial/current patient goals, please see plan of care.  Estimated Length of Stay:  D/C today  Reason for Continuation of Hospitalization:   New Problems/Goals identified:  N/A  Discharge Plan or Barriers:   return home, follow up outpt  Additional Comments:  Attendees:  Signature: Steva Colder, MD 01/07/2014 10:22 AM   Signature: Ripley Fraise, LCSW 01/07/2014 10:22 AM  Signature: Elmarie Shiley, NP 01/07/2014 10:22 AM  Signature: Mayra Neer, RN 01/07/2014 10:22 AM  Signature: Darrol Angel, RN 01/07/2014 10:22 AM  Signature:  01/07/2014 10:22 AM  Signature:   01/07/2014 10:22 AM  Signature:    Signature:    Signature:    Signature:    Signature:    Signature:      Scribe for Treatment Team:   Ripley Fraise, LCSW  01/07/2014 10:22 AM

## 2014-01-07 NOTE — Progress Notes (Signed)
Patient ID: Kathleen Mcfarland, female   DOB: 1960/08/25, 54 y.o.   MRN: [id number] Patient discharged per self inventory; patient denies SI/HI and A/V hallucinations; patient received samples, prescriptions, bus pass, and copy of AVS after it was reviewed; patient had no other questions or concerns at this time; patient verbalized and signed that all belongings are returned; patient left the unit ambulatory

## 2014-01-07 NOTE — Discharge Summary (Signed)
Physician Discharge Summary Note  Patient:  Kathleen Mcfarland is an 53 y.o., female MRN:  [id number] DOB:  Feb 20, 1961 Patient phone:  [phone] (home)  Patient address:   [address] [place] [address],  Total Time spent with patient: Greater than 30 minutes  Date of Admission:  01/02/2014 Date of Discharge: 01/07/14  Reason for Admission: Mood stabilization treatments  Discharge Diagnoses: Principal Problem:   Major depressive disorder, recurrent episode, severe, specified as with psychotic behavior Active Problems:   Depression   Psychiatric Specialty Exam: Physical Exam  Psychiatric: Her speech is normal and behavior is normal. Judgment and thought content normal. Her mood appears not anxious. Her affect is not angry, not blunt, not labile and not inappropriate. Cognition and memory are normal. She does not exhibit a depressed mood.    Review of Systems  Constitutional: Negative.   HENT: Negative.   Eyes: Negative.   Cardiovascular: Negative.   Gastrointestinal: Negative.   Genitourinary: Negative.   Musculoskeletal: Negative.   Skin: Negative.   Neurological: Negative.   Endo/Heme/Allergies: Negative.   Psychiatric/Behavioral: Positive for depression (Stabilized with medication priro to discharge) and hallucinations (Hx of). Negative for suicidal ideas, memory loss and substance abuse. The patient has insomnia (Stabilized with medication prior to discharge). The patient is not nervous/anxious.     Blood pressure 136/75, pulse 112, temperature 98.1 F (36.7 C), temperature source Oral, resp. rate 20, height 5\' 9"  (1.753 m), weight 94.802 kg (209 lb), last menstrual period 01/01/2014, SpO2 100.00%.Body mass index is 30.85 kg/(m^2).   General Appearance: Casual   Eye Contact:: Good   Speech: Clear and Coherent   Volume: Normal   Mood: Euthymic   Affect: Congruent   Thought Process: Goal Directed   Orientation: Full (Time, Place, and Person)   Thought  Content: Denies AH/VH   Suicidal Thoughts: No   Homicidal Thoughts: No   Memory: Immediate; Good  Recent; Good  Remote; Good   Judgement: Intact   Insight: Fair   Psychomotor Activity: Normal   Concentration: Good   Recall: Good   Fund of Knowledge:Fair   Language: Fair   Akathisia: No     AIMS (if indicated): 0   Assets: Communication Skills   Sleep: Number of Hours: 6.5    Past Psychiatric History: Diagnosis: Major depressive disorder, recurrent episode, severe, specified as with psychotic behavior  Hospitalizations: [facility] adult unit  Outpatient Care:  [facility]  Substance Abuse Care: NA  Self-Mutilation: NA  Suicidal Attempts: NA  Violent Behaviors: NA   Musculoskeletal: Strength & Muscle Tone: within normal limits Gait & Station: normal Patient leans: N/A  DSM5: Schizophrenia Disorders:  NA Obsessive-Compulsive Disorders:  NA Trauma-Stressor Disorders:  NA Substance/Addictive Disorders:  NA Depressive Disorders:  Major depressive disorder, recurrent episode, severe, specified as with psychotic behavior  Axis Diagnosis:  AXIS I:  Major depressive disorder, recurrent episode, severe, specified as with psychotic behavior AXIS II:  Deferred AXIS III:   Past Medical History  Diagnosis Date  . Allergic rhinitis   . Hypothyroidism   . Hypertension   . Osteoarthritis of knee   . Osteoarthritis 2012    bilateral knees  . Complication of anesthesia     hard to wake up  . Back pain   . Depression   . Anxiety    AXIS IV:  other psychosocial or environmental problems and mental illness, chronic AXIS V:  63  Level of Care:  OP  Hospital Course:  I am going through  something that is making me very depressed and suicidal".  Kathleen Mcfarland was admitted to the hospital with complaints of worsening depression with suicidal ideations. She was in need of of mood stabilization treatment. And while a patient on this unit, Kathleen Mcfarland was medicated and discharged on Abilify 10 mg  Q bedtime for mood control, Citalopram 20 mg daily for depression and Trazodone 100 mg Q bedtime for insomnia. She was also enrolled in and participated in the group counseling sessions being offered and held on this unit. She learned coping skills that should help her cope better and manage her symptoms after discharge. She was resumed on all her pertinent home medications for her other pre-existing medical issues that she presented. She tolerated her treatment regimen without any significant adverse effects and or reactions.  Kathleen Mcfarland's symptoms responded well to her treatment regimen. This is evidenced by her reports of improved mood, presentation of good affects/eye contact and absence of suicidal ideations. She is currently being discharged to continue routine psychiatric treatment and medication management at the [facility] clinic here in [place], [place]. Kathleen Mcfarland has been provided with all necessary information required to make this appointment without problems.  Upon discharge, she adamantly denies any SIHI, AVH, delusional thoughts and or paranoia. She received from the [place], a 14 days worth, supply samples of her [facility] discharge medications. She left [facility] with all personal belongings in no apparent distress. Transportation per daughter.  Consults:  psychiatry  Significant Diagnostic Studies:  labs: CBC with diff, CMP, UDS, toxicology tests, U/A  Discharge Vitals:   Blood pressure 136/75, pulse 112, temperature 98.1 F (36.7 C), temperature source Oral, resp. rate 20, height 5\' 9"  (1.753 m), weight 94.802 kg (209 lb), last menstrual period 01/01/2014, SpO2 100.00%. Body mass index is 30.85 kg/(m^2). Lab Results:   No results found for this or any previous visit (from the past 72 hour(s)).  Physical Findings: AIMS:  , ,  ,  ,    CIWA:    COWS:     Psychiatric Specialty Exam: See Psychiatric Specialty Exam and Suicide Risk Assessment completed by Attending Physician prior to  discharge.  Discharge destination:  Home  Is patient on multiple antipsychotic therapies at discharge:  No   Has Patient had three or more failed trials of antipsychotic monotherapy by history:  No  Recommended Plan for Multiple Antipsychotic Therapies: NA    Medication List    STOP taking these medications       diphenhydramine-acetaminophen 25-500 MG Tabs  Commonly known as:  TYLENOL PM     montelukast 10 MG tablet  Commonly known as:  SINGULAIR     oxyCODONE-acetaminophen 7.5-325 MG per tablet  Commonly known as:  PERCOCET     traMADol 50 MG tablet  Commonly known as:  ULTRAM     vitamin C 100 MG tablet      TAKE these medications     Indication   ARIPiprazole 10 MG tablet  Commonly known as:  ABILIFY  Take 1 tablet (10 mg total) by mouth at bedtime. For mood control   Indication:  Mood control     cholecalciferol 1000 UNITS tablet  Commonly known as:  VITAMIN D  Take 1 tablet (1,000 Units total) by mouth daily. For bone health   Indication:  Bone health     citalopram 20 MG tablet  Commonly known as:  CELEXA  Take 1 tablet (20 mg total) by mouth daily. For depression   Indication:  Depression  cyclobenzaprine 5 MG tablet  Commonly known as:  FLEXERIL  Take 1 tablet (5 mg total) by mouth 3 (three) times daily as needed for muscle spasms.   Indication:  Muscle Spasm     levothyroxine 175 MCG tablet  Commonly known as:  SYNTHROID, LEVOTHROID  Take 1 tablet (175 mcg total) by mouth daily before breakfast. For low thyroid function   Indication:  Underactive Thyroid     mometasone 50 MCG/ACT nasal spray  Commonly known as:  NASONEX  Place 2 sprays into the nose daily. For allergies   Indication:  Perennial Rhinitis, Hayfever     multivitamin per tablet  Take 1 tablet by mouth daily. For low vitamin   Indication:  Low vitamin     traZODone 100 MG tablet  Commonly known as:  DESYREL  Take 1 tablet (100 mg total) by mouth at bedtime. For sleep    Indication:  Trouble Sleeping     triamterene-hydrochlorothiazide 37.5-25 MG per tablet  Commonly known as:  MAXZIDE-25  Take 1 tablet by mouth daily. For high blood pressure   Indication:  High Blood Pressure       Follow-up Information   Follow up with [place]     On 01/13/2014. (Tuesday at [facility])    Contact information:   [place] [phone]      Follow up with [facility]. (Go tothe walk-in clinic M-F between 8 and 10 AM for your hospital follow up appointment.  this is where you will see a psychiatrist.  They also have a pharmacy, and will work with you with the [place] [facility] to get your medications for free.)    Contact information:   [place]      Follow up with [place].   Contact information:   [place] [phone]     Follow-up recommendations: Activity:  As tolerated Diet: As recommended by your primary care doctor. Keep all scheduled follow-up appointments as recommended.   Comments:  Take all your medications as prescribed by your mental healthcare provider. Report any adverse effects and or reactions from your medicines to your outpatient provider promptly. Patient is instructed and cautioned to not engage in alcohol and or illegal drug use while on prescription medicines. In the event of worsening symptoms, patient is instructed to call the crisis hotline, 911 and or go to the nearest ED for appropriate evaluation and treatment of symptoms. Follow-up with your primary care provider for your other medical issues, concerns and or health care needs.   Total Discharge Time:  Greater than 30 minutes.  Signed: Lindell Spar I, [place] 01/07/2014, 10:18 AM

## 2014-01-08 NOTE — Discharge Summary (Signed)
Patient was seen face to face for psychiatric evaluation, suicide risk assessment and case discussed with treatment team and NP and made appropriate disposition plans. Reviewed the information documented and agree with the treatment plan.   Dakai Braithwaite ,MD Attending Psychiatrist  [facility]    

## 2014-01-12 NOTE — Progress Notes (Signed)
Patient Discharge Instructions:  After Visit Summary (AVS):   Faxed to:  01/12/14 Discharge Summary Note:   Faxed to:  01/12/14 Psychiatric Admission Assessment Note:   Faxed to:  01/12/14 Suicide Risk Assessment - Discharge Assessment:   Faxed to:  01/12/14 Faxed/Sent to the Next Level Care provider:  01/12/14 Next Level Care Provider Has Access to the EMR, 01/12/14 Faxed to  [place] @ [phone] Faxed to [place] @ [phone] Records provided to [place] via [facility]/Epic access.   Kathleen Mcfarland, 01/12/2014, 3:23 PM

## 2014-01-13 ENCOUNTER — Emergency Department (HOSPITAL_COMMUNITY): Payer: No Typology Code available for payment source

## 2014-01-13 ENCOUNTER — Ambulatory Visit: Payer: Self-pay | Admitting: Family Medicine

## 2014-01-13 ENCOUNTER — Emergency Department (HOSPITAL_COMMUNITY)
Admission: EM | Admit: 2014-01-13 | Discharge: 2014-01-13 | Disposition: A | Discharge status: Home / Self Care | Payer: No Typology Code available for payment source | Attending: Emergency Medicine | Admitting: Emergency Medicine

## 2014-01-13 ENCOUNTER — Encounter (HOSPITAL_COMMUNITY): Payer: Self-pay | Admitting: Emergency Medicine

## 2014-01-13 DIAGNOSIS — F411 Generalized anxiety disorder: Secondary | ICD-10-CM | POA: Insufficient documentation

## 2014-01-13 DIAGNOSIS — E039 Hypothyroidism, unspecified: Secondary | ICD-10-CM | POA: Diagnosis not present

## 2014-01-13 DIAGNOSIS — R209 Unspecified disturbances of skin sensation: Secondary | ICD-10-CM | POA: Insufficient documentation

## 2014-01-13 DIAGNOSIS — F3289 Other specified depressive episodes: Secondary | ICD-10-CM | POA: Diagnosis not present

## 2014-01-13 DIAGNOSIS — M545 Low back pain, unspecified: Secondary | ICD-10-CM

## 2014-01-13 DIAGNOSIS — I10 Essential (primary) hypertension: Secondary | ICD-10-CM | POA: Insufficient documentation

## 2014-01-13 DIAGNOSIS — M549 Dorsalgia, unspecified: Secondary | ICD-10-CM | POA: Insufficient documentation

## 2014-01-13 DIAGNOSIS — Z87828 Personal history of other (healed) physical injury and trauma: Secondary | ICD-10-CM | POA: Insufficient documentation

## 2014-01-13 DIAGNOSIS — M1710 Unilateral primary osteoarthritis, unspecified knee: Secondary | ICD-10-CM | POA: Insufficient documentation

## 2014-01-13 DIAGNOSIS — Z79899 Other long term (current) drug therapy: Secondary | ICD-10-CM | POA: Insufficient documentation

## 2014-01-13 DIAGNOSIS — Z9889 Other specified postprocedural states: Secondary | ICD-10-CM | POA: Diagnosis not present

## 2014-01-13 DIAGNOSIS — M546 Pain in thoracic spine: Secondary | ICD-10-CM | POA: Insufficient documentation

## 2014-01-13 DIAGNOSIS — IMO0002 Reserved for concepts with insufficient information to code with codable children: Secondary | ICD-10-CM | POA: Diagnosis not present

## 2014-01-13 DIAGNOSIS — F329 Major depressive disorder, single episode, unspecified: Secondary | ICD-10-CM | POA: Insufficient documentation
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
## 2014-01-13 NOTE — ED Notes (Signed)
Informed radiology of patient's need for xray copied onto a disc
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
## 2014-01-17 NOTE — ED Provider Notes (Signed)
Medical screening examination/treatment/procedure(s) were performed by non-physician practitioner and as supervising physician I was immediately available for consultation/collaboration.  Ephraim Hamburger, MD 01/17/14 0730

## 2014-01-30 ENCOUNTER — Ambulatory Visit: Payer: Self-pay | Attending: Internal Medicine | Admitting: Internal Medicine

## 2014-01-30 ENCOUNTER — Encounter: Payer: Self-pay | Admitting: Internal Medicine

## 2014-01-30 VITALS — BP 138/90 | HR 86 | Temp 97.6°F | Resp 16 | Wt 237.4 lb

## 2014-01-30 DIAGNOSIS — Z23 Encounter for immunization: Secondary | ICD-10-CM

## 2014-01-30 DIAGNOSIS — M5134 Other intervertebral disc degeneration, thoracic region: Secondary | ICD-10-CM | POA: Insufficient documentation

## 2014-01-30 DIAGNOSIS — E038 Other specified hypothyroidism: Secondary | ICD-10-CM | POA: Insufficient documentation

## 2014-01-30 DIAGNOSIS — Z1211 Encounter for screening for malignant neoplasm of colon: Secondary | ICD-10-CM

## 2014-01-30 DIAGNOSIS — F329 Major depressive disorder, single episode, unspecified: Secondary | ICD-10-CM | POA: Insufficient documentation

## 2014-01-30 DIAGNOSIS — M1990 Unspecified osteoarthritis, unspecified site: Secondary | ICD-10-CM

## 2014-01-30 DIAGNOSIS — M479 Spondylosis, unspecified: Secondary | ICD-10-CM | POA: Insufficient documentation

## 2014-01-30 DIAGNOSIS — I10 Essential (primary) hypertension: Secondary | ICD-10-CM

## 2014-01-30 DIAGNOSIS — Z139 Encounter for screening, unspecified: Secondary | ICD-10-CM

## 2014-01-30 DIAGNOSIS — F32A Depression, unspecified: Secondary | ICD-10-CM

## 2014-01-30 DIAGNOSIS — E039 Hypothyroidism, unspecified: Secondary | ICD-10-CM | POA: Insufficient documentation

## 2014-01-30 DIAGNOSIS — M62830 Muscle spasm of back: Secondary | ICD-10-CM

## 2014-01-30 LAB — COMPLETE METABOLIC PANEL WITH GFR
ALK PHOS: 51 U/L (ref 39–117)
ALT: 22 U/L (ref 0–35)
AST: 24 U/L (ref 0–37)
Albumin: 3.9 g/dL (ref 3.5–5.2)
BILIRUBIN TOTAL: 0.3 mg/dL (ref 0.2–1.2)
BUN: 13 mg/dL (ref 6–23)
CALCIUM: 8.7 mg/dL (ref 8.4–10.5)
CO2: 26 mEq/L (ref 19–32)
CREATININE: 0.75 mg/dL (ref 0.50–1.10)
Chloride: 108 mEq/L (ref 96–112)
GFR, Est African American: >89 mL/min
GFR, Est Non African American: >89 mL/min
Glucose, Bld: 119 mg/dL — ABNORMAL HIGH (ref 70–99)
Potassium: 4.1 mEq/L (ref 3.5–5.3)
Sodium: 140 mEq/L (ref 135–145)
Total Protein: 6.5 g/dL (ref 6.0–8.3)

## 2014-01-30 MED ORDER — CYCLOBENZAPRINE HCL 5 MG PO TABS: 5.0000 mg | ORAL_TABLET | Freq: Three times a day (TID) | ORAL | Status: DC | PRN

## 2014-01-30 MED ORDER — TRIAMTERENE-HCTZ 37.5-25 MG PO TABS: 1.0000 | ORAL_TABLET | Freq: Every day | ORAL | Status: DC

## 2014-01-30 MED ORDER — IBUPROFEN 600 MG PO TABS: 600.0000 mg | ORAL_TABLET | Freq: Three times a day (TID) | ORAL | Status: DC | PRN

## 2014-01-30 MED ORDER — LEVOTHYROXINE SODIUM 175 MCG PO TABS: 175.0000 ug | ORAL_TABLET | Freq: Every day | ORAL | Status: DC

## 2014-01-30 NOTE — Progress Notes (Signed)
Patient here to establish care States has osteoarthritis to bilateral knees Slipped discs in her spine

## 2014-01-30 NOTE — Progress Notes (Signed)
Patient Demographics  Kathleen Mcfarland, is a 53 y.o. female  KYH:[id number]  TDV:[id number]  DOB - 04-16-1961  CC:  Chief Complaint  Patient presents with  . Establish Care       HPI: Kathleen Mcfarland is a 53 y.o. female here today to establish medical care.Patient has history of hypertension, hypothyroidism, depression/mood disorder, last month she was hospitalized with symptoms of worsening depression and suicidal ideation, EMR reviewed patient was discharged on Abilify and citalopram and trazodone for insomnia, she was advised to followup with [facility]. Patient also recently went to the ER with symptoms of back pain, EMR reviewed, she had x-rays done reported to have mild DJD at L4-5 and L5-S1, and her mild degenerative disc changes at multiple thoracic levels no acute compression or fracture. Patient also reported to have arthritis in both knees and was following up with orthopedics in the past, she is requesting something medication, as per patient she ran out of her blood pressure medication, currently she is following up at [facility] for her behavioral needs, denies any SI or HI Patient has No headache, No chest pain, No abdominal pain - No Nausea, No new weakness tingling or numbness, No Cough - SOB.  Allergies  Allergen Reactions  . Felodipine Nausea And Vomiting   Past Medical History  Diagnosis Date  . Allergic rhinitis   . Hypothyroidism   . Hypertension   . Osteoarthritis of knee   . Osteoarthritis 2012    bilateral knees  . Complication of anesthesia     hard to wake up  . Back pain   . Depression   . Anxiety    Current Outpatient Prescriptions on File Prior to Visit  Medication Sig Dispense Refill  . ARIPiprazole (ABILIFY) 10 MG tablet Take 1 tablet (10 mg total) by mouth at bedtime. For mood control  30 tablet  0  . cholecalciferol (VITAMIN D) 1000 UNITS tablet Take 1 tablet (1,000 Units total) by mouth daily. For bone health      . citalopram (CELEXA) 20 MG  tablet Take 1 tablet (20 mg total) by mouth daily. For depression  30 tablet  0   No current facility-administered medications on file prior to visit.   Family History  Problem Relation Age of Onset  . Breast cancer Neg Hx   . Stroke Neg Hx   . Coronary artery disease Father 39    heart disease diagnoised  . Diabetes Father   . Hypertension Father   . Heart disease Father   . Colon cancer      great uncle at 16, great aunt dx at 32, GM age 9  . Colon polyps      2 sisters dx at age 58 and 42  . Diabetes Mother   . Hypertension Mother   . Arthritis Sister   . Cancer Maternal Grandmother   . Cancer Maternal Grandfather   . Heart disease Paternal Grandmother    History   Social History  . Marital Status: Single    Spouse Name: N/A    Number of Children: N/A  . Years of Education: N/A   Occupational History  . Materials engineer     Social History Main Topics  . Smoking status: Never Smoker   . Smokeless tobacco: Never Used  . Alcohol Use: No  . Drug Use: No  . Sexual Activity: Yes    Birth Control/ Protection: Other-see comments     Comment: BTL   Other Topics Concern  .  Not on file   Social History Narrative   Regular Exercise- yes 3/week   Caffeine- no    Review of Systems: Constitutional: Negative for fever, chills, diaphoresis, activity change, appetite change and fatigue. HENT: Negative for ear pain, nosebleeds, congestion, facial swelling, rhinorrhea, neck pain, neck stiffness and ear discharge.  Eyes: Negative for pain, discharge, redness, itching and visual disturbance. Respiratory: Negative for cough, choking, chest tightness, shortness of breath, wheezing and stridor.  Cardiovascular: Negative for chest pain, palpitations and leg swelling. Gastrointestinal: Negative for abdominal distention. Genitourinary: Negative for dysuria, urgency, frequency, hematuria, flank pain, decreased urine volume, difficulty urinating and dyspareunia.  Musculoskeletal:  Negative for back pain, joint swelling, arthralgia and gait problem. Neurological: Negative for dizziness, tremors, seizures, syncope, facial asymmetry, speech difficulty, weakness, light-headedness, numbness and headaches.  Hematological: Negative for adenopathy. Does not bruise/bleed easily. Psychiatric/Behavioral: Negative for hallucinations, behavioral problems, confusion, dysphoric mood, decreased concentration and agitation.    Objective:   Filed Vitals:   01/30/14 1411  BP: 138/90  Pulse: 86  Temp: 97.6 F (36.4 C)  Resp: 16    Physical Exam: Constitutional: Patient appears well-developed and well-nourished. No distress. HENT: Normocephalic, atraumatic, External right and left ear normal. Oropharynx is clear and moist.  Eyes: Conjunctivae and EOM are normal. PERRLA, no scleral icterus. Neck: Normal ROM. Neck supple. No JVD. No tracheal deviation. No thyromegaly. CVS: RRR, S1/S2 +, no murmurs, no gallops, no carotid bruit.  Pulmonary: Effort and breath sounds normal, no stridor, rhonchi, wheezes, rales.  Abdominal: Soft. BS +, no distension, tenderness, rebound or guarding.  Musculoskeletal: Normal range of motion. Bilateral knees minimal  Tenderness.+ crepitation  Neuro: Alert. Normal reflexes, muscle tone coordination. No cranial nerve deficit. Skin: Skin is warm and dry. No rash noted. Not diaphoretic. No erythema. No pallor. Psychiatric: Normal mood and affect. Behavior, judgment, thought content normal.  Lab Results  Component Value Date   WBC 5.3 01/01/2014   HGB 13.5 01/01/2014   HCT 40.9 01/01/2014   MCV 80.7 01/01/2014   PLT 303 01/01/2014   Lab Results  Component Value Date   CREATININE 0.77 01/01/2014   BUN 10 01/01/2014   NA 138 01/01/2014   K 3.7 01/01/2014   CL 99 01/01/2014   CO2 25 01/01/2014    No results found for this basename: HGBA1C   Lipid Panel     Component Value Date/Time   CHOL 125 01/03/2011 0844   TRIG 42.0 01/03/2011 0844   HDL 36.80* 01/03/2011 0844    CHOLHDL 3 01/03/2011 0844   VLDL 8.4 01/03/2011 0844   LDLCALC 80 01/03/2011 0844       Assessment and plan:   1. Encounter for immunization Flu shot given today  2. HYPERTENSION, BENIGN ESSENTIAL Advised for DASH diet continue with - triamterene-hydrochlorothiazide (MAXZIDE-25) 37.5-25 MG per tablet; Take 1 tablet by mouth daily. For high blood pressure  Dispense: 30 tablet; Refill: 3  3. Depression Patient is currently on Celexa and Abilify following up with the psychiatrist.  4. Other specified hypothyroidism Recent TSH level was in normal range, continue with current dosage will repeat level on the next visit. - levothyroxine (SYNTHROID, LEVOTHROID) 175 MCG tablet; Take 1 tablet (175 mcg total) by mouth daily before breakfast. For low thyroid function  Dispense: 30 tablet; Refill: 3  5. Back muscle spasm  - cyclobenzaprine (FLEXERIL) 5 MG tablet; Take 1 tablet (5 mg total) by mouth 3 (three) times daily as needed for muscle spasms.  Dispense: 30 tablet; Refill:  1  6. Arthritis  - ibuprofen (ADVIL,MOTRIN) 600 MG tablet; Take 1 tablet (600 mg total) by mouth every 8 (eight) hours as needed.  Dispense: 30 tablet; Refill: 1  7. Special screening for malignant neoplasms, colon  - Ambulatory referral to Gastroenterology  8. Screening Ordered baseline blood work. - Hemoglobin A1c - Vit D  25 hydroxy (rtn osteoporosis monitoring) - COMPLETE METABOLIC PANEL WITH GFR - Ambulatory referral to Gynecology - MM DIGITAL SCREENING BILATERAL; Future  Health Maintenance -Colonoscopy: referred to GI -Pap Smear: referred to GYN -Mammogram: ordered. -Vaccinations:   -Influenza shot given today   Return in about 3 months (around 05/02/2014) for hypertension, hypothyroid.   Lorayne Marek, MD

## 2014-01-30 NOTE — Patient Instructions (Signed)
DASH Eating Plan °DASH stands for "Dietary Approaches to Stop Hypertension." The DASH eating plan is a healthy eating plan that has been shown to reduce high blood pressure (hypertension). Additional health benefits may include reducing the risk of type 2 diabetes mellitus, heart disease, and stroke. The DASH eating plan may also help with weight loss. °WHAT DO I NEED TO KNOW ABOUT THE DASH EATING PLAN? °For the DASH eating plan, you will follow these general guidelines: °· Choose foods with a percent daily value for sodium of less than 5% (as listed on the food label). °· Use salt-free seasonings or herbs instead of table salt or sea salt. °· Check with your health care provider or pharmacist before using salt substitutes. °· Eat lower-sodium products, often labeled as "lower sodium" or "no salt added." °· Eat fresh foods. °· Eat more vegetables, fruits, and low-fat dairy products. °· Choose whole grains. Look for the word "whole" as the first word in the ingredient list. °· Choose fish and skinless chicken or turkey more often than red meat. Limit fish, poultry, and meat to 6 oz (170 g) each day. °· Limit sweets, desserts, sugars, and sugary drinks. °· Choose heart-healthy fats. °· Limit cheese to 1 oz (28 g) per day. °· Eat more home-cooked food and less restaurant, buffet, and fast food. °· Limit fried foods. °· Cook foods using methods other than frying. °· Limit canned vegetables. If you do use them, rinse them well to decrease the sodium. °· When eating at a restaurant, ask that your food be prepared with less salt, or no salt if possible. °WHAT FOODS CAN I EAT? °Seek help from a dietitian for individual calorie needs. °Grains °Whole grain or whole wheat bread. Brown rice. Whole grain or whole wheat pasta. Quinoa, bulgur, and whole grain cereals. Low-sodium cereals. Corn or whole wheat flour tortillas. Whole grain cornbread. Whole grain crackers. Low-sodium crackers. °Vegetables °Fresh or frozen vegetables  (raw, steamed, roasted, or grilled). Low-sodium or reduced-sodium tomato and vegetable juices. Low-sodium or reduced-sodium tomato sauce and paste. Low-sodium or reduced-sodium canned vegetables.  °Fruits °All fresh, canned (in natural juice), or frozen fruits. °Meat and Other Protein Products °Ground beef (85% or leaner), grass-fed beef, or beef trimmed of fat. Skinless chicken or turkey. Ground chicken or turkey. Pork trimmed of fat. All fish and seafood. Eggs. Dried beans, peas, or lentils. Unsalted nuts and seeds. Unsalted canned beans. °Dairy °Low-fat dairy products, such as skim or 1% milk, 2% or reduced-fat cheeses, low-fat ricotta or cottage cheese, or plain low-fat yogurt. Low-sodium or reduced-sodium cheeses. °Fats and Oils °Tub margarines without trans fats. Light or reduced-fat mayonnaise and salad dressings (reduced sodium). Avocado. Safflower, olive, or canola oils. Natural peanut or almond butter. °Other °Unsalted popcorn and pretzels. °The items listed above may not be a complete list of recommended foods or beverages. Contact your dietitian for more options. °WHAT FOODS ARE NOT RECOMMENDED? °Grains °White bread. White pasta. White rice. Refined cornbread. Bagels and croissants. Crackers that contain trans fat. °Vegetables °Creamed or fried vegetables. Vegetables in a cheese sauce. Regular canned vegetables. Regular canned tomato sauce and paste. Regular tomato and vegetable juices. °Fruits °Dried fruits. Canned fruit in light or heavy syrup. Fruit juice. °Meat and Other Protein Products °Fatty cuts of meat. Ribs, chicken wings, bacon, sausage, bologna, salami, chitterlings, fatback, hot dogs, bratwurst, and packaged luncheon meats. Salted nuts and seeds. Canned beans with salt. °Dairy °Whole or 2% milk, cream, half-and-half, and cream cheese. Whole-fat or sweetened yogurt. Full-fat   cheeses or blue cheese. Nondairy creamers and whipped toppings. Processed cheese, cheese spreads, or cheese  curds. °Condiments °Onion and garlic salt, seasoned salt, table salt, and sea salt. Canned and packaged gravies. Worcestershire sauce. Tartar sauce. Barbecue sauce. Teriyaki sauce. Soy sauce, including reduced sodium. Steak sauce. Fish sauce. Oyster sauce. Cocktail sauce. Horseradish. Ketchup and mustard. Meat flavorings and tenderizers. Bouillon cubes. Hot sauce. Tabasco sauce. Marinades. Taco seasonings. Relishes. °Fats and Oils °Butter, stick margarine, lard, shortening, ghee, and bacon fat. Coconut, palm kernel, or palm oils. Regular salad dressings. °Other °Pickles and olives. Salted popcorn and pretzels. °The items listed above may not be a complete list of foods and beverages to avoid. Contact your dietitian for more information. °WHERE CAN I FIND MORE INFORMATION? °National Heart, Lung, and [employer] Institute: www.nhlbi.nih.gov/health/health-topics/topics/dash/ °Document Released: 04/06/2011 Document Revised: 09/01/2013 Document Reviewed: 02/19/2013 °ExitCare® Patient Information ©2015 [employer]. This information is not intended to replace advice given to you by your health care provider. Make sure you discuss any questions you have with your health care provider. ° °

## 2014-01-31 LAB — HEMOGLOBIN A1C
Hgb A1c MFr Bld: 6 % — ABNORMAL HIGH (ref <5.7)
MEAN PLASMA GLUCOSE: 126 mg/dL — AB (ref <117)

## 2014-01-31 LAB — VITAMIN D 25 HYDROXY (VIT D DEFICIENCY, FRACTURES): VIT D 25 HYDROXY: 33 ng/mL (ref 30–89)

## 2014-02-02 ENCOUNTER — Telehealth: Payer: Self-pay | Admitting: Emergency Medicine

## 2014-02-02 ENCOUNTER — Ambulatory Visit: Payer: Self-pay | Attending: Internal Medicine

## 2014-02-02 ENCOUNTER — Ambulatory Visit: Payer: Self-pay | Admitting: Internal Medicine

## 2014-02-02 NOTE — Telephone Encounter (Signed)
Message copied by Ricci Barker on Mon Feb 02, 2014  5:10 PM ------      Message from: Lorayne Marek      Created: Mon Feb 02, 2014  9:46 AM       Blood work reviewed noticed hemoglobin A1c of 6.0%, patient has prediabetes, call and advise patient for low carbohydrate diet.             ------

## 2014-02-02 NOTE — Telephone Encounter (Signed)
Pt given lab results with education on eating low carbohydrates food with exercising Pt verbalized understanding. Informed her we will recheck A1C in 3 mnths

## 2014-02-05 ENCOUNTER — Ambulatory Visit: Payer: No Typology Code available for payment source | Attending: Internal Medicine

## 2014-02-06 ENCOUNTER — Ambulatory Visit: Payer: Self-pay | Attending: Family Medicine | Admitting: Family Medicine

## 2014-02-06 ENCOUNTER — Encounter: Payer: Self-pay | Admitting: Family Medicine

## 2014-02-06 ENCOUNTER — Other Ambulatory Visit: Payer: Self-pay

## 2014-02-06 ENCOUNTER — Other Ambulatory Visit (HOSPITAL_COMMUNITY)
Admission: RE | Admit: 2014-02-06 | Discharge: 2014-02-06 | Disposition: A | Discharge status: Home / Self Care | Payer: No Typology Code available for payment source | Source: Ambulatory Visit | Attending: Family Medicine | Admitting: Family Medicine

## 2014-02-06 VITALS — BP 112/73 | HR 79 | Temp 98.5°F | Resp 18 | Ht 66.0 in | Wt 230.0 lb

## 2014-02-06 DIAGNOSIS — Z124 Encounter for screening for malignant neoplasm of cervix: Secondary | ICD-10-CM | POA: Insufficient documentation

## 2014-02-06 DIAGNOSIS — Z1239 Encounter for other screening for malignant neoplasm of breast: Secondary | ICD-10-CM | POA: Insufficient documentation

## 2014-02-06 DIAGNOSIS — Z01419 Encounter for gynecological examination (general) (routine) without abnormal findings: Secondary | ICD-10-CM | POA: Insufficient documentation

## 2014-02-06 DIAGNOSIS — E038 Other specified hypothyroidism: Secondary | ICD-10-CM

## 2014-02-06 MED ORDER — LEVOTHYROXINE SODIUM 175 MCG PO TABS: 175.0000 ug | ORAL_TABLET | Freq: Every day | ORAL | Status: DC

## 2014-02-06 NOTE — Patient Instructions (Signed)
Ms. Hefferan,  Thank you for coming in today. Very nice to meet you. You will be called with pap and culture results.  Please schedule your mammogram.  F/u with Dr. Annitta Needs in 3 months for f/u A1c. Great job with weight loss!  Wt Readings from Last 3 Encounters:  02/06/14 230 lb (104.327 kg)  01/30/14 237 lb 6.4 oz (107.684 kg)  01/13/14 230 lb (104.327 kg)     Dr. Adrian Blackwater

## 2014-02-06 NOTE — Assessment & Plan Note (Signed)
A: no history of abnormal paps. Not currently sexually active. Small shallow ulcer R perineum.  P: Pap done.  GC/chlam Wet prep HSV

## 2014-02-06 NOTE — Progress Notes (Signed)
   Subjective:    Patient ID: Kathleen Mcfarland, female    DOB: [birth year], 53 y.o.   MRN: [id number] CC: f/u for pap, internal referral PCP: Dr. Annitta Needs   HPI 53 yo F for pap and breast exam. No history of abnormal pap smears. No complaints. Due for screening mammogram.   Soc hx: non smoker  Review of Systems As per HPI     Objective:   Physical Exam BP 112/73  Pulse 79  Temp(Src) 98.5 F (36.9 C) (Oral)  Resp 18  Ht 5\' 6"  (1.676 m)  Wt 230 lb (104.327 kg)  BMI 37.14 kg/m2  LMP 01/26/2014 General appearance: alert, cooperative and no distress Breasts: normal appearance, no masses or tenderness, Inspection negative, No nipple retraction or dimpling, No nipple discharge or bleeding, No axillary or supraclavicular adenopathy, Normal to palpation without dominant masses Pelvic: cervix normal in appearance, no adnexal masses or tenderness, no cervical motion tenderness, rectovaginal septum normal, uterus normal size, shape, and consistency, vagina normal without discharge and external genitalia normal except for superfical ulceration, small, < 5 mm R perineum        Assessment & Plan:

## 2014-02-06 NOTE — Progress Notes (Signed)
Annual physical and pap 

## 2014-02-09 LAB — CERVICOVAGINAL ANCILLARY ONLY
Chlamydia: NEGATIVE
Neisseria Gonorrhea: NEGATIVE
WET PREP (BD AFFIRM): NEGATIVE
Wet Prep (BD Affirm): NEGATIVE
Wet Prep (BD Affirm): NEGATIVE

## 2014-02-10 LAB — CYTOLOGY - PAP

## 2014-02-11 LAB — CERVICOVAGINAL ANCILLARY ONLY: Herpes: NEGATIVE

## 2014-02-16 ENCOUNTER — Telehealth: Payer: Self-pay | Admitting: *Deleted

## 2014-02-16 NOTE — Telephone Encounter (Signed)
Pt aware of Pap, Wep prep and GC/Chlamydia  results

## 2014-02-18 ENCOUNTER — Telehealth: Payer: Self-pay | Admitting: *Deleted

## 2014-02-18 NOTE — Telephone Encounter (Deleted)
Message copied by Betti Cruz on Wed Feb 18, 2014 11:54 AM ------      Message from: Kathleen Mcfarland      Created: Tue Feb 17, 2014  9:51 AM       Negative herpes titers ------

## 2014-02-18 NOTE — Telephone Encounter (Signed)
Pt aware of lab results 

## 2014-02-18 NOTE — Telephone Encounter (Signed)
Message copied by Betti Cruz on Wed Feb 18, 2014 11:55 AM ------      Message from: Boykin Nearing      Created: Tue Feb 17, 2014  9:51 AM       Negative herpes titers ------

## 2014-03-03 ENCOUNTER — Telehealth: Payer: Self-pay | Admitting: Internal Medicine

## 2014-03-03 NOTE — Telephone Encounter (Signed)
Pt takes OTC vitamin D because it helps her osteoarthritis symptoms but would like to know if she can prescribed a script for a higher strength. Please f/u with pt.

## 2014-03-04 NOTE — Telephone Encounter (Signed)
Request forwarded to patient's PCP. She saw me last just for pap.  Please ask patient was dose she takes currently and pass information on her to her PCP.

## 2014-03-04 NOTE — Telephone Encounter (Signed)
Expand All Collapse All   Pt takes OTC vitamin D because it helps her osteoarthritis symptoms but would like to know if she can prescribed a script for a higher strength. Please f/u with pt.

## 2014-03-13 NOTE — Telephone Encounter (Signed)
Pt takes OTC vitamin D because it helps her osteoarthritis symptoms but would like to know if she can prescribed a script for a higher strength. Please f/u with pt.

## 2014-03-20 ENCOUNTER — Telehealth: Payer: Self-pay | Admitting: *Deleted

## 2014-03-20 NOTE — Telephone Encounter (Signed)
After speaking with Dr. Annitta Needs he said that her VIT. D was WNL and that she should take the Vit. D Supplement. I called the pt and explained what the doctor has said.

## 2014-04-01 ENCOUNTER — Ambulatory Visit (HOSPITAL_COMMUNITY)
Admission: RE | Admit: 2014-04-01 | Discharge: 2014-04-01 | Disposition: A | Discharge status: Home / Self Care | Payer: No Typology Code available for payment source | Source: Ambulatory Visit | Attending: Internal Medicine | Admitting: Internal Medicine

## 2014-04-01 DIAGNOSIS — Z139 Encounter for screening, unspecified: Secondary | ICD-10-CM

## 2014-05-28 ENCOUNTER — Other Ambulatory Visit: Payer: Self-pay | Admitting: Internal Medicine

## 2014-06-05 ENCOUNTER — Telehealth: Payer: Self-pay | Admitting: Internal Medicine

## 2014-06-05 NOTE — Telephone Encounter (Signed)
Patient is requesting a refill for the following medication: triamterene-hydrochlorothiazide (MAXZIDE-25) 37.5-25 MG per tablet  I checked her chart and see that she does have refills but I am not sure why pharmacy told her that she didn't have any refills. I went ahead and transferred her to pharmacy.

## 2014-06-16 ENCOUNTER — Ambulatory Visit: Payer: Self-pay | Admitting: Internal Medicine

## 2014-06-22 ENCOUNTER — Ambulatory Visit: Payer: Self-pay | Attending: Internal Medicine | Admitting: Internal Medicine

## 2014-06-22 ENCOUNTER — Encounter: Payer: Self-pay | Admitting: Internal Medicine

## 2014-06-22 VITALS — BP 146/87 | HR 77 | Temp 98.3°F | Resp 16 | Wt 231.8 lb

## 2014-06-22 DIAGNOSIS — M479 Spondylosis, unspecified: Secondary | ICD-10-CM

## 2014-06-22 DIAGNOSIS — I10 Essential (primary) hypertension: Secondary | ICD-10-CM

## 2014-06-22 DIAGNOSIS — R7303 Prediabetes: Secondary | ICD-10-CM

## 2014-06-22 DIAGNOSIS — R7309 Other abnormal glucose: Secondary | ICD-10-CM

## 2014-06-22 DIAGNOSIS — F329 Major depressive disorder, single episode, unspecified: Secondary | ICD-10-CM | POA: Insufficient documentation

## 2014-06-22 DIAGNOSIS — H538 Other visual disturbances: Secondary | ICD-10-CM

## 2014-06-22 DIAGNOSIS — M1990 Unspecified osteoarthritis, unspecified site: Secondary | ICD-10-CM | POA: Insufficient documentation

## 2014-06-22 DIAGNOSIS — Z833 Family history of diabetes mellitus: Secondary | ICD-10-CM | POA: Insufficient documentation

## 2014-06-22 DIAGNOSIS — M62830 Muscle spasm of back: Secondary | ICD-10-CM

## 2014-06-22 DIAGNOSIS — M170 Bilateral primary osteoarthritis of knee: Secondary | ICD-10-CM | POA: Insufficient documentation

## 2014-06-22 DIAGNOSIS — E038 Other specified hypothyroidism: Secondary | ICD-10-CM

## 2014-06-22 LAB — COMPLETE METABOLIC PANEL WITH GFR
ALT: 25 U/L (ref 0–35)
AST: 29 U/L (ref 0–37)
Albumin: 4 g/dL (ref 3.5–5.2)
Alkaline Phosphatase: 54 U/L (ref 39–117)
BILIRUBIN TOTAL: 0.5 mg/dL (ref 0.2–1.2)
BUN: 12 mg/dL (ref 6–23)
CO2: 28 mEq/L (ref 19–32)
CREATININE: 0.79 mg/dL (ref 0.50–1.10)
Calcium: 9.6 mg/dL (ref 8.4–10.5)
Chloride: 102 mEq/L (ref 96–112)
GFR, Est Non African American: 86 mL/min
Glucose, Bld: 90 mg/dL (ref 70–99)
Potassium: 4.4 mEq/L (ref 3.5–5.3)
Sodium: 138 mEq/L (ref 135–145)
Total Protein: 7.2 g/dL (ref 6.0–8.3)

## 2014-06-22 LAB — TSH: TSH: 0.667 u[IU]/mL (ref 0.350–4.500)

## 2014-06-22 MED ORDER — TRIAMTERENE-HCTZ 37.5-25 MG PO TABS
ORAL_TABLET | ORAL | Status: DC
Start: 2014-06-22 — End: 2015-02-25

## 2014-06-22 NOTE — Progress Notes (Signed)
Patient here for follow up on her chronic back pain and refills on  Her blood pressure medication

## 2014-06-22 NOTE — Progress Notes (Signed)
MRN: [id number] Name: Kathleen Mcfarland  Sex: female Age: 54 y.o. DOB: 01-01-1961  Allergies: Felodipine  Chief Complaint  Patient presents with  . Follow-up    HPI: Patient is 54 y.o. female who has to of hypertension, hypothyroidism, arthritis of knees and back, comes today requesting refill on her medication also patient used to see orthopedic Dr. In the past and had been given a steroid injections, she is requesting another referral, today her blood pressure is borderline elevated, denies any headache dizziness chest and shortness of breath, her previous blood work reviewed with the patient noticed hemoglobin A1c of 6.0%, patient has prediabetes, she does report family history of diabetes as well. Patient is counseled for low carbohydrate diet.Patient is also complaining of blurry vision, she also wears corrective glasses.  Past Medical History  Diagnosis Date  . Allergic rhinitis   . Hypothyroidism   . Hypertension   . Osteoarthritis of knee   . Osteoarthritis 2012    bilateral knees  . Complication of anesthesia     hard to wake up  . Back pain   . Depression   . Anxiety     Past Surgical History  Procedure Laterality Date  . Tubal ligation    . Rotator cuff repair      Right  . Dilation and curettage of uterus    . Shoulder arthroscopy  04/13/2011    Procedure: ARTHROSCOPY SHOULDER;  Surgeon: Ninetta Lights, MD;  Location: [place];  Service: Orthopedics;  Laterality: Right;  Right Shoulder Arthroscopy with Acrmioploasty, Arhtroscopic rotator cuff repair and Microfracture humeral head  . Shoulder arthroscopy with rotator cuff repair Right 07/04/2012    Procedure: SHOULDER ARTHROSCOPY WITH ROTATOR CUFF REPAIR;  Surgeon: Ninetta Lights, MD;  Location: [place];  Service: Orthopedics;  Laterality: Right;  RIGHT SHOULDER ARTHROSCOPY WITH ARTHROSCOPIC ROTATOR CUFF REPAIR, LYSIS OF ADHESIONS  . Colonscopy         Medication List       This list is accurate as of: 06/22/14 12:38 PM.  Always use your most recent med list.               ARIPiprazole 10 MG tablet  Commonly known as:  ABILIFY  Take 1 tablet (10 mg total) by mouth at bedtime. For mood control     cholecalciferol 1000 UNITS tablet  Commonly known as:  VITAMIN D  Take 1 tablet (1,000 Units total) by mouth daily. For bone health     citalopram 20 MG tablet  Commonly known as:  CELEXA  Take 1 tablet (20 mg total) by mouth daily. For depression     cyclobenzaprine 5 MG tablet  Commonly known as:  FLEXERIL  Take 1 tablet (5 mg total) by mouth 3 (three) times daily as needed for muscle spasms.     ibuprofen 600 MG tablet  Commonly known as:  ADVIL,MOTRIN  Take 1 tablet (600 mg total) by mouth every 8 (eight) hours as needed.     levothyroxine 175 MCG tablet  Commonly known as:  SYNTHROID, LEVOTHROID  Take 1 tablet (175 mcg total) by mouth daily before breakfast. For low thyroid function     oxyCODONE-acetaminophen 5-325 MG per tablet  Commonly known as:  PERCOCET/ROXICET  Take by mouth every 4 (four) hours as needed for severe pain.     traMADol 50 MG tablet  Commonly known as:  ULTRAM  Take by mouth every 6 (six) hours as needed.  triamterene-hydrochlorothiazide 37.5-25 MG per tablet  Commonly known as:  MAXZIDE-25  TAKE 1 TABLET BY MOUTH DAILY. FOR HIGH BLOOD PRESSURE        Meds ordered this encounter  Medications  . triamterene-hydrochlorothiazide (MAXZIDE-25) 37.5-25 MG per tablet    Sig: TAKE 1 TABLET BY MOUTH DAILY. FOR HIGH BLOOD PRESSURE    Dispense:  30 tablet    Refill:  3    Immunization History  Administered Date(s) Administered  . Influenza Split 03/29/2011  . Influenza Whole 02/11/2008, 01/18/2010  . Influenza,inj,Quad PF,36+ Mos 01/30/2014  . Td 05/01/2006    Family History  Problem Relation Age of Onset  . Breast cancer Neg Hx   . Stroke Neg Hx   . Coronary artery disease Father 47    heart disease  diagnoised  . Diabetes Father   . Hypertension Father   . Heart disease Father   . Colon cancer      great uncle at 100, great aunt dx at 20, GM age 52  . Colon polyps      2 sisters dx at age 57 and 4  . Diabetes Mother   . Hypertension Mother   . Arthritis Sister   . Cancer Maternal Grandmother   . Cancer Maternal Grandfather   . Heart disease Paternal Grandmother     History  Substance Use Topics  . Smoking status: Never Smoker   . Smokeless tobacco: Never Used  . Alcohol Use: No    Review of Systems   As noted in HPI  Filed Vitals:   06/22/14 1159  BP: 146/87  Pulse: 77  Temp: 98.3 F (36.8 C)  Resp: 16    Physical Exam  Physical Exam  Constitutional: No distress.  Eyes: EOM are normal. Pupils are equal, round, and reactive to light.  Cardiovascular: Normal rate and regular rhythm.   Pulmonary/Chest: Breath sounds normal. No respiratory distress. She has no wheezes. She has no rales.    CBC    Component Value Date/Time   WBC 5.3 01/01/2014 1529   RBC 5.07 01/01/2014 1529   HGB 13.5 01/01/2014 1529   HCT 40.9 01/01/2014 1529   PLT 303 01/01/2014 1529   MCV 80.7 01/01/2014 1529   LYMPHSABS 1.7 01/03/2011 0844   MONOABS 0.3 01/03/2011 0844   EOSABS 0.2 01/03/2011 0844   BASOSABS 0.0 01/03/2011 0844    CMP     Component Value Date/Time   NA 140 01/30/2014 1450   K 4.1 01/30/2014 1450   CL 108 01/30/2014 1450   CO2 26 01/30/2014 1450   GLUCOSE 119* 01/30/2014 1450   GLUCOSE 121* 04/26/2006 1348   BUN 13 01/30/2014 1450   CREATININE 0.75 01/30/2014 1450   CREATININE 0.77 01/01/2014 1529   CALCIUM 8.7 01/30/2014 1450   PROT 6.5 01/30/2014 1450   ALBUMIN 3.9 01/30/2014 1450   AST 24 01/30/2014 1450   ALT 22 01/30/2014 1450   ALKPHOS 51 01/30/2014 1450   BILITOT 0.3 01/30/2014 1450   GFRNONAA >89 01/30/2014 1450   GFRNONAA >90 01/01/2014 1529   GFRAA >89 01/30/2014 1450   GFRAA >90 01/01/2014 1529    Lab Results  Component Value  Date/Time   CHOL 125 01/03/2011 08:44 AM    No components found for: HGA1C  Lab Results  Component Value Date/Time   AST 24 01/30/2014 02:50 PM    Assessment and Plan  HYPERTENSION, BENIGN ESSENTIAL - Plan:I have advised patient for DASH diet, continue with  triamterene-hydrochlorothiazide (MAXZIDE-25) 37.5-25 MG  per tablet, COMPLETE METABOLIC PANEL WITH GFR  Other specified hypothyroidism - Plan: currently patient is on levothyroxine 175 mcg daily, repeat TSH  Prediabetes Patient is advised for low carbohydrate diet, check A1c on the following visit.  Back muscle spasm Flexeril each bedtime.  Arthritis of back - Plan: Ambulatory referral to Orthopedic Surgery  Blurry vision - Plan: Ambulatory referral to Ophthalmology   Health Maintenance  -Mammogram:up-to-date -Vaccinations:  Up-to-date with flu shot  Return in about 3 months (around 09/20/2014) for hypertension.   This note has been created with Surveyor, quantity. Any transcriptional errors are unintentional.    Lorayne Marek, MD

## 2014-06-22 NOTE — Patient Instructions (Addendum)
DASH Eating Plan DASH stands for "Dietary Approaches to Stop Hypertension." The DASH eating plan is a healthy eating plan that has been shown to reduce high blood pressure (hypertension). Additional health benefits may include reducing the risk of type 2 diabetes mellitus, heart disease, and stroke. The DASH eating plan may also help with weight loss. WHAT DO I NEED TO KNOW ABOUT THE DASH EATING PLAN? For the DASH eating plan, you will follow these general guidelines:  Choose foods with a percent daily value for sodium of less than 5% (as listed on the food label).  Use salt-free seasonings or herbs instead of table salt or sea salt.  Check with your health care provider or pharmacist before using salt substitutes.  Eat lower-sodium products, often labeled as "lower sodium" or "no salt added."  Eat fresh foods.  Eat more vegetables, fruits, and low-fat dairy products.  Choose whole grains. Look for the word "whole" as the first word in the ingredient list.  Choose fish and skinless chicken or turkey more often than red meat. Limit fish, poultry, and meat to 6 oz (170 g) each day.  Limit sweets, desserts, sugars, and sugary drinks.  Choose heart-healthy fats.  Limit cheese to 1 oz (28 g) per day.  Eat more home-cooked food and less restaurant, buffet, and fast food.  Limit fried foods.  Cook foods using methods other than frying.  Limit canned vegetables. If you do use them, rinse them well to decrease the sodium.  When eating at a restaurant, ask that your food be prepared with less salt, or no salt if possible. WHAT FOODS CAN I EAT? Seek help from a dietitian for individual calorie needs. Grains Whole grain or whole wheat bread. Brown rice. Whole grain or whole wheat pasta. Quinoa, bulgur, and whole grain cereals. Low-sodium cereals. Corn or whole wheat flour tortillas. Whole grain cornbread. Whole grain crackers. Low-sodium crackers. Vegetables Fresh or frozen vegetables  (raw, steamed, roasted, or grilled). Low-sodium or reduced-sodium tomato and vegetable juices. Low-sodium or reduced-sodium tomato sauce and paste. Low-sodium or reduced-sodium canned vegetables.  Fruits All fresh, canned (in natural juice), or frozen fruits. Meat and Other Protein Products Ground beef (85% or leaner), grass-fed beef, or beef trimmed of fat. Skinless chicken or turkey. Ground chicken or turkey. Pork trimmed of fat. All fish and seafood. Eggs. Dried beans, peas, or lentils. Unsalted nuts and seeds. Unsalted canned beans. Dairy Low-fat dairy products, such as skim or 1% milk, 2% or reduced-fat cheeses, low-fat ricotta or cottage cheese, or plain low-fat yogurt. Low-sodium or reduced-sodium cheeses. Fats and Oils Tub margarines without trans fats. Light or reduced-fat mayonnaise and salad dressings (reduced sodium). Avocado. Safflower, olive, or canola oils. Natural peanut or almond butter. Other Unsalted popcorn and pretzels. The items listed above may not be a complete list of recommended foods or beverages. Contact your dietitian for more options. WHAT FOODS ARE NOT RECOMMENDED? Grains White bread. White pasta. White rice. Refined cornbread. Bagels and croissants. Crackers that contain trans fat. Vegetables Creamed or fried vegetables. Vegetables in a cheese sauce. Regular canned vegetables. Regular canned tomato sauce and paste. Regular tomato and vegetable juices. Fruits Dried fruits. Canned fruit in light or heavy syrup. Fruit juice. Meat and Other Protein Products Fatty cuts of meat. Ribs, chicken wings, bacon, sausage, bologna, salami, chitterlings, fatback, hot dogs, bratwurst, and packaged luncheon meats. Salted nuts and seeds. Canned beans with salt. Dairy Whole or 2% milk, cream, half-and-half, and cream cheese. Whole-fat or sweetened yogurt. Full-fat   cheeses or blue cheese. Nondairy creamers and whipped toppings. Processed cheese, cheese spreads, or cheese  curds. Condiments Onion and garlic salt, seasoned salt, table salt, and sea salt. Canned and packaged gravies. Worcestershire sauce. Tartar sauce. Barbecue sauce. Teriyaki sauce. Soy sauce, including reduced sodium. Steak sauce. Fish sauce. Oyster sauce. Cocktail sauce. Horseradish. Ketchup and mustard. Meat flavorings and tenderizers. Bouillon cubes. Hot sauce. Tabasco sauce. Marinades. Taco seasonings. Relishes. Fats and Oils Butter, stick margarine, lard, shortening, ghee, and bacon fat. Coconut, palm kernel, or palm oils. Regular salad dressings. Other Pickles and olives. Salted popcorn and pretzels. The items listed above may not be a complete list of foods and beverages to avoid. Contact your dietitian for more information. WHERE CAN I FIND MORE INFORMATION? National Heart, Lung, and [employer] Institute: www.nhlbi.nih.gov/health/health-topics/topics/dash/ Document Released: 04/06/2011 Document Revised: 09/01/2013 Document Reviewed: 02/19/2013 ExitCare Patient Information 2015 [employer]. This information is not intended to replace advice given to you by your health care provider. Make sure you discuss any questions you have with your health care provider. Diabetes Mellitus and Food It is important for you to manage your blood sugar (glucose) level. Your blood glucose level can be greatly affected by what you eat. Eating healthier foods in the appropriate amounts throughout the day at about the same time each day will help you control your blood glucose level. It can also help slow or prevent worsening of your diabetes mellitus. Healthy eating may even help you improve the level of your blood pressure and reach or maintain a healthy weight.  HOW CAN FOOD AFFECT ME? Carbohydrates Carbohydrates affect your blood glucose level more than any other type of food. Your dietitian will help you determine how many carbohydrates to eat at each meal and teach you how to count carbohydrates. Counting  carbohydrates is important to keep your blood glucose at a healthy level, especially if you are using insulin or taking certain medicines for diabetes mellitus. Alcohol Alcohol can cause sudden decreases in blood glucose (hypoglycemia), especially if you use insulin or take certain medicines for diabetes mellitus. Hypoglycemia can be a life-threatening condition. Symptoms of hypoglycemia (sleepiness, dizziness, and disorientation) are similar to symptoms of having too much alcohol.  If your health care provider has given you approval to drink alcohol, do so in moderation and use the following guidelines:  Women should not have more than one drink per day, and men should not have more than two drinks per day. One drink is equal to:  12 oz of beer.  5 oz of wine.  1 oz of hard liquor.  Do not drink on an empty stomach.  Keep yourself hydrated. Have water, diet soda, or unsweetened iced tea.  Regular soda, juice, and other mixers might contain a lot of carbohydrates and should be counted. WHAT FOODS ARE NOT RECOMMENDED? As you make food choices, it is important to remember that all foods are not the same. Some foods have fewer nutrients per serving than other foods, even though they might have the same number of calories or carbohydrates. It is difficult to get your body what it needs when you eat foods with fewer nutrients. Examples of foods that you should avoid that are high in calories and carbohydrates but low in nutrients include:  Trans fats (most processed foods list trans fats on the Nutrition Facts label).  Regular soda.  Juice.  Candy.  Sweets, such as cake, pie, doughnuts, and cookies.  Fried foods. WHAT FOODS CAN I EAT? Have nutrient-rich foods,   which will nourish your body and keep you healthy. The food you should eat also will depend on several factors, including:  The calories you need.  The medicines you take.  Your weight.  Your blood glucose level.  Your  blood pressure level.  Your cholesterol level. You also should eat a variety of foods, including:  Protein, such as meat, poultry, fish, tofu, nuts, and seeds (lean animal proteins are best).  Fruits.  Vegetables.  Dairy products, such as milk, cheese, and yogurt (low fat is best).  Breads, grains, pasta, cereal, rice, and beans.  Fats such as olive oil, trans fat-free margarine, canola oil, avocado, and olives. DOES EVERYONE WITH DIABETES MELLITUS HAVE THE SAME MEAL PLAN? Because every person with diabetes mellitus is different, there is not one meal plan that works for everyone. It is very important that you meet with a dietitian who will help you create a meal plan that is just right for you. Document Released: 01/12/2005 Document Revised: 04/22/2013 Document Reviewed: 03/14/2013 ExitCare Patient Information 2015 [employer]. This information is not intended to replace advice given to you by your health care provider. Make sure you discuss any questions you have with your health care provider.  

## 2014-06-25 ENCOUNTER — Telehealth: Payer: Self-pay

## 2014-06-25 NOTE — Telephone Encounter (Signed)
-----   Message from Lorayne Marek, MD sent at 06/23/2014 10:10 AM EST ----- Call and that the patient know that her thyroid level is in normal range continue with current dose of levothyroxine.

## 2014-06-25 NOTE — Telephone Encounter (Signed)
Patient is aware of her lab results 

## 2014-07-01 ENCOUNTER — Telehealth: Payer: Self-pay

## 2014-07-01 NOTE — Telephone Encounter (Signed)
Referring notes state that patient had MRI-L, called referring office and they report no MRI on file or ordered. I spoke with patient and she reports never having an MRI.

## 2014-07-02 ENCOUNTER — Ambulatory Visit (INDEPENDENT_AMBULATORY_CARE_PROVIDER_SITE_OTHER): Payer: Self-pay | Admitting: Neurology

## 2014-07-02 ENCOUNTER — Encounter: Payer: Self-pay | Admitting: Neurology

## 2014-07-02 VITALS — BP 120/78 | HR 94 | Temp 96.8°F | Resp 18 | Ht 66.0 in | Wt 227.0 lb

## 2014-07-02 DIAGNOSIS — M5440 Lumbago with sciatica, unspecified side: Secondary | ICD-10-CM

## 2014-07-02 NOTE — Progress Notes (Signed)
Subjective:    Patient ID: Kathleen Mcfarland is a 54 y.o. female.  HPI     Star Age, MD, PhD [facility] [address] [place], [place] [address]  Dear Dr. Marlou Sa,  I saw your patient, Kathleen Mcfarland, upon your kind request in my neurologic clinic today for initial consultation of her chronic low back pain of 2 years duration. The patient is unaccompanied today. As you know, Kathleen Mcfarland is a 54 year old right-handed woman with an underlying medical history of degenerative arthritis, hypothyroidism, depression, hypertension and obesity, who has had low back pain for 2 years. She has right shoulder pain and left shoulder pain. She has bilateral knee pain. She had an injection into her left knee today. She has seen Dr. Noemi Chapel for her left shoulder. She had injections into her  lower back twice without success. We have referred her to pain management and she is an appointment at sports medicine with Dr. Nori Riis tomorrow. She had an MRI of her lumbar spine which showed some degenerative changes but no surgical treatment was recommended. This was done last year. She has no history of diabetes with neuropathy. She walks with a cane. She is currently on symptomatically treatment with Flexeril and Percocet, as needed. She also takes tramadol as needed.  His Past Medical History Is Significant For: Past Medical History  Diagnosis Date  . Allergic rhinitis   . Hypothyroidism   . Hypertension   . Osteoarthritis of knee   . Osteoarthritis 2012    bilateral knees  . Complication of anesthesia     hard to wake up  . Back pain   . Depression   . Anxiety     His Past Surgical History Is Significant For: Past Surgical History  Procedure Laterality Date  . Tubal ligation    . Rotator cuff repair      Right  . Dilation and curettage of uterus    . Shoulder arthroscopy  04/13/2011    Procedure: ARTHROSCOPY SHOULDER;  Surgeon: Ninetta Lights, MD;   Location: [place];  Service: Orthopedics;  Laterality: Right;  Right Shoulder Arthroscopy with Acrmioploasty, Arhtroscopic rotator cuff repair and Microfracture humeral head  . Shoulder arthroscopy with rotator cuff repair Right 07/04/2012    Procedure: SHOULDER ARTHROSCOPY WITH ROTATOR CUFF REPAIR;  Surgeon: Ninetta Lights, MD;  Location: [place];  Service: Orthopedics;  Laterality: Right;  RIGHT SHOULDER ARTHROSCOPY WITH ARTHROSCOPIC ROTATOR CUFF REPAIR, LYSIS OF ADHESIONS  . Colonscopy       His Family History Is Significant For: Family History  Problem Relation Age of Onset  . Breast cancer Neg Hx   . Stroke Neg Hx   . Coronary artery disease Father 29    heart disease diagnoised  . Diabetes Father   . Hypertension Father   . Heart disease Father   . Colon cancer      great uncle at 5, great aunt dx at 47, GM age 28  . Colon polyps      2 sisters dx at age 52 and 45  . Diabetes Mother   . Hypertension Mother   . Arthritis Sister   . Cancer Maternal Grandmother   . Cancer Maternal Grandfather   . Heart disease Paternal Grandmother     His Social History Is Significant For: History   Social History  . Marital Status: Single    Spouse Name: N/A  . Number of Children: 2  .  Years of Education: N/A   Occupational History  . None    Social History Main Topics  . Smoking status: Never Smoker   . Smokeless tobacco: Never Used  . Alcohol Use: No  . Drug Use: No  . Sexual Activity: Yes    Birth Control/ Protection: Other-see comments     Comment: BTL   Other Topics Concern  . None   Social History Narrative   Regular Exercise- yes 3/week   Caffeine- no    His Allergies Are:  Allergies  Allergen Reactions  . Felodipine Nausea And Vomiting  :   His Current Medications Are:  Outpatient Encounter Prescriptions as of 07/02/2014  Medication Sig  . ARIPiprazole (ABILIFY) 10 MG tablet Take 1 tablet (10 mg total) by mouth at bedtime.  For mood control  . cholecalciferol (VITAMIN D) 1000 UNITS tablet Take 1 tablet (1,000 Units total) by mouth daily. For bone health  . citalopram (CELEXA) 20 MG tablet Take 1 tablet (20 mg total) by mouth daily. For depression  . cyclobenzaprine (FLEXERIL) 5 MG tablet Take 1 tablet (5 mg total) by mouth 3 (three) times daily as needed for muscle spasms.  Marland Kitchen ibuprofen (ADVIL,MOTRIN) 600 MG tablet Take 1 tablet (600 mg total) by mouth every 8 (eight) hours as needed.  Marland Kitchen levothyroxine (SYNTHROID, LEVOTHROID) 175 MCG tablet Take 1 tablet (175 mcg total) by mouth daily before breakfast. For low thyroid function  . oxyCODONE-acetaminophen (PERCOCET/ROXICET) 5-325 MG per tablet Take by mouth every 4 (four) hours as needed for severe pain.  . traMADol (ULTRAM) 50 MG tablet Take by mouth every 6 (six) hours as needed.  . triamterene-hydrochlorothiazide (MAXZIDE-25) 37.5-25 MG per tablet TAKE 1 TABLET BY MOUTH DAILY. FOR HIGH BLOOD PRESSURE  :   Review of Systems:  Out of a complete 14 point review of systems, all are reviewed and negative with the exception of these symptoms as listed below:   Review of Systems  Musculoskeletal: Positive for joint swelling.       Joint pain, Cramps, Aching muscles  Skin:       Skin sensitivity  Neurological: Positive for weakness and headaches.       Dizziness, Restless legs   Psychiatric/Behavioral:       Depression, Not enough sleep     Objective:  Neurologic Exam  Physical Exam Physical Examination:   Filed Vitals:   07/02/14 1331  BP: 120/78  Pulse: 94  Temp: 96.8 F (36 C)  Resp: 18    General Examination: The patient is a very pleasant 54 y.o. female in no acute distress. She appears well-developed and well-nourished and well groomed.   HEENT: Normocephalic, atraumatic, pupils are equal, round and reactive to light and accommodation. Extraocular tracking is good without limitation to gaze excursion or nystagmus noted. Normal smooth pursuit is  noted. Hearing is grossly intact. Face is symmetric with normal facial animation and normal facial sensation. Speech is clear with no dysarthria noted. There is no hypophonia. There is no lip, neck/head, jaw or voice tremor. There are no carotid bruits on auscultation. Oropharynx exam reveals: moderate mouth dryness, good dental hygiene and mild airway crowding. Mallampati is class II. Tongue protrudes centrally and palate elevates symmetrically.   Chest: Clear to auscultation without wheezing, rhonchi or crackles noted.  Heart: S1+S2+0, regular and normal without murmurs, rubs or gallops noted.   Abdomen: Soft, non-tender and non-distended with normal bowel sounds appreciated on auscultation.  Extremities: There is no pitting edema in the distal  lower extremities bilaterally. Pedal pulses are intact.  Skin: Warm and dry without trophic changes noted. There are no varicose veins.  Musculoskeletal: exam reveals pain and decrease of range of motion of her left shoulder and left knee. She reports midline low back pain with some radiation to her legs bilaterally.    Neurologically:  Mental status: The patient is awake, alert and oriented in all 4 spheres. Her immediate and remote memory, attention, language skills and fund of knowledge are appropriate. There is no evidence of aphasia, agnosia, apraxia or anomia. Speech is clear with normal prosody and enunciation. Thought process is linear. Mood is normal and affect is blunted.  Cranial nerves II - XII are as described above under HEENT exam. In addition: shoulder shrug is normal with equal shoulder height noted. Motor exam: Normal bulk, strength and tone is noted. There is no drift, tremor or rebound. Romberg is negative. Reflexes are 1+ throughout. Fine motor skills and coordination are grossly intact.sensory exam is intact to light touch, pinprick, vibration and temperature sense in the upper extremities and lower extremities bilaterally. She stands  up slowly and pushes himself up. She walks with a cane. Tandem walk is not tested. She reports low back pain with walking.               Assessment and Plan:   In summary, Kathleen Mcfarland is a very pleasant 54 y.o.-year old female with an underlying medical history of degenerative arthritis, hypothyroidism, depression, hypertension and obesity, who has had low back pain for 2 years. She has had appropriate workup and symptomatic treatment through your office. Neurologically there is no focality on exam. At this juncture, I agree with your referral to pain management. From the neurological standpoint I do not see any signs and symptoms of a primary neurological disorder and degenerative arthritis is probably the cause of her symptoms. You can certainly consider physical therapy. She is advised to discuss with you physical therapy and referral to a spine surgeon if you deem necessary. She has an appointment with Dr. Nori Riis per your referral tomorrow. I suggested an as needed follow-up with me.  Thank you very much for allowing me to participate in the care of this nice patient. If I can be of any further assistance to you please do not hesitate to call me at [phone].  Sincerely,   Star Age, MD, PhD

## 2014-07-02 NOTE — Patient Instructions (Signed)
I will write back to your orthopedic doctor. Keep your appointment tomorrow. I do not see a primary neurological problem in your exam.

## 2014-07-03 ENCOUNTER — Encounter: Payer: Self-pay | Admitting: Family Medicine

## 2014-07-03 ENCOUNTER — Ambulatory Visit (INDEPENDENT_AMBULATORY_CARE_PROVIDER_SITE_OTHER): Payer: Self-pay | Admitting: Family Medicine

## 2014-07-03 VITALS — BP 144/92 | HR 102 | Ht 67.0 in | Wt 227.0 lb

## 2014-07-03 DIAGNOSIS — M62830 Muscle spasm of back: Secondary | ICD-10-CM

## 2014-07-03 NOTE — Progress Notes (Addendum)
Patient ID: Kathleen Mcfarland, female   DOB: 1960-11-20, 54 y.o.   MRN: [id number] [place] Attending Note: I have seen and examined this patient. I have discussed this patient with the resident and reviewed the assessment and plan as documented above. I agree with the resident's findings and plan. I reviewed with the patient her entire workup for her back pain including her x-rays, showing her the images. I really don't think there is anything that we can have offer her from the nonoperative orthopedic standpoint. She has already done physical therapy. It helps him initially but she doesn't want to do that again. She has done water aerobics which helped for a while but does not want to do that again. She would like to have another MRI but without any specific symptoms I don't think that's going to be helpful to [place]. Her back pain is most consistent with mechanical back pain. I'm sure her knees (followed by Dr. Marlou Sa).play some part in this. She has had previous epidural steroid injections by her report and she does not want to pursue that again either. We do not do chronic pain medication management so I'm referring her back to her primary care provider. She is quite unhappy at our visit saying that she just "wasted her time.".  Unfortunately, I just do not see that we have anything additional  to offer her. I will not charge her for this visit although  I did spend greater than 50% of our 30 minute office visit in counseling education regarding these issues.Marland Kitchen

## 2014-07-03 NOTE — Assessment & Plan Note (Signed)
Chronic lumbar back pain without signs of radiculopathy.  Has been treated with physical therapy and epidural steroid injections in the past without reported relief.  Has been evaluated by orthopedist and neurologist with MRI 1-2 years ago did not show any indications for surgery.  Pain.  Currently being treated with muscle relaxers and opioids with referral to pain management clinic.  - Unfortunately due to her extensive previous treatments without improvement do not see much stalled her from a nonsurgical orthopedic standpoint - Discussed physical therapy and weight loss - she reports difficulty with physical therapy and exercise to lose weight due to current level of pain, but reports losing ~ 7 pounds in the last couple months due to diet - Advised her to follow-up with her PCP in current pain management referral - MRI defer to this visit as no current radicular symptoms; advised following up with orthopedist if she wanted to discuss surgical options

## 2014-07-03 NOTE — Progress Notes (Signed)
  [facility] [address] [place], [place] [address] Phone: [phone] Fax: [phone]   Patient Name: Kathleen Mcfarland Date of Birth: 1960/05/17 Medical Record Number: [id number] Gender: female Date of Encounter: 07/03/2014  SUBJECTIVE:  Kathleen Mcfarland is a 54 y.o. very pleasant female patient who presents with chronic lower back pain.  She reports undergoing multiple previous therapies for her lower back pain including: Physical therapy & epidural steroid injections.  She reports MRI performed at orthopedic's office 1-2 years ago that showed mild disc herniation.  She was seen by neurology yesterday without any acute radicular symptoms that would necessitate surgery.  She has been referred to pain management clinic by her PCP.  She reports gradual worsening of her chronic back pain over the past several years.  Reports great disability currently due to this, as well as her knee pain.   BACK PAIN Quality: Achy and sharp  Location & Radiation: Diffuse lumbar pain without radiation Worse with: All activities including standing, sitting, lying  Better with: Pain medication  Any Trauma: Denies   Red Flags  Urinary retention: no   Numbness/Weakness: yes, reports weakness due to pain   Fever/chills/sweats: no   Unexplained weight loss: no   Hx of cancer/immunosuppression: no   Hx of IV drug use: no   Hx of osteoporosis or chronic steroid use: no   ROS:  See HPI  DATA REVIEWED:  EPIC notes reviewed DG lumbar 01/13/14: mild degenerative facet joint change at L4-5 and at L5-S1.  PERTINENT PMH / PSH FH / / SH:  Past Medical, Surgical, Social, and Family History Reviewed & Updated per EMR. Pertinent Historical Findings include:   Depression  OBJECTIVE:  Filed Vitals:   07/03/14 1045  BP: 144/92  Pulse: 102   Filed Vitals:   07/03/14 1045  Height: 5\' 7"  (1.702 m)  Weight: 227 lb (102.967 kg)   Body mass index is 35.55  kg/(m^2).  Back: Normal skin w/o rash, Spine with normal alignment and no deformity. No tenderness to vertebral process palpation.  Paraspinous muscles are moderately tender and without spasm.    Negative straight leg bilaterally  Sensation intact bilaterally throughout Lower extremities  Poor effort with strength testing due to reported pain.  4-5 strength with bilateral hip flexion, knee extension and flexion, ankle dorsiflexion and plantar flexion.   Patellar and ankle reflexes 1+ bilaterally  ASSESSMENT & PLAN:  See problem based charting & AVS for pt instructions.  Phill Myron, MD  Patient seen and evaluated by Dr Nori Riis who agrees with the plan.

## 2014-07-28 ENCOUNTER — Encounter: Payer: Self-pay | Admitting: Internal Medicine

## 2014-07-28 ENCOUNTER — Ambulatory Visit: Payer: Self-pay | Attending: Internal Medicine | Admitting: Internal Medicine

## 2014-07-28 VITALS — BP 140/83 | HR 72 | Temp 99.1°F | Resp 16 | Wt 224.0 lb

## 2014-07-28 DIAGNOSIS — Z91048 Other nonmedicinal substance allergy status: Secondary | ICD-10-CM

## 2014-07-28 DIAGNOSIS — F329 Major depressive disorder, single episode, unspecified: Secondary | ICD-10-CM | POA: Insufficient documentation

## 2014-07-28 DIAGNOSIS — Z9109 Other allergy status, other than to drugs and biological substances: Secondary | ICD-10-CM

## 2014-07-28 DIAGNOSIS — R0981 Nasal congestion: Secondary | ICD-10-CM | POA: Insufficient documentation

## 2014-07-28 DIAGNOSIS — I10 Essential (primary) hypertension: Secondary | ICD-10-CM | POA: Insufficient documentation

## 2014-07-28 DIAGNOSIS — J309 Allergic rhinitis, unspecified: Secondary | ICD-10-CM | POA: Insufficient documentation

## 2014-07-28 DIAGNOSIS — E039 Hypothyroidism, unspecified: Secondary | ICD-10-CM | POA: Insufficient documentation

## 2014-07-28 DIAGNOSIS — Z7951 Long term (current) use of inhaled steroids: Secondary | ICD-10-CM | POA: Insufficient documentation

## 2014-07-28 MED ORDER — CETIRIZINE HCL 10 MG PO TABS: 10.0000 mg | ORAL_TABLET | Freq: Every day | ORAL | Status: DC

## 2014-07-28 MED ORDER — FLUTICASONE PROPIONATE 50 MCG/ACT NA SUSP: 2.0000 | Freq: Every day | NASAL | Status: DC

## 2014-07-28 NOTE — Progress Notes (Signed)
Patient states here for her allergies Has been coughing sneezing and congested for almost three weeks Has been taking benadryl with no releif

## 2014-07-28 NOTE — Progress Notes (Signed)
MRN: [id number] Name: Kathleen Mcfarland  Sex: female Age: 54 y.o. DOB: 1960/08/22  Allergies: Felodipine  Chief Complaint  Patient presents with  . Cough    HPI: Patient is 54 y.o. female who who comes today complaining of allergy symptoms as per patient the change in weather contributes to it she is itchy ears scratchy throat stuffy nose postnasal drip and occasional coughing which is worse at night, she denies smoking cigarettes denies any chest pain or shortness of breath, she has tried over-the-counter Benadryl without much improvement.  Past Medical History  Diagnosis Date  . Allergic rhinitis   . Hypothyroidism   . Hypertension   . Osteoarthritis of knee   . Osteoarthritis 2012    bilateral knees  . Complication of anesthesia     hard to wake up  . Back pain   . Depression   . Anxiety     Past Surgical History  Procedure Laterality Date  . Tubal ligation    . Rotator cuff repair      Right  . Dilation and curettage of uterus    . Shoulder arthroscopy  04/13/2011    Procedure: ARTHROSCOPY SHOULDER;  Surgeon: Ninetta Lights, MD;  Location: [facility];  Service: Orthopedics;  Laterality: Right;  Right Shoulder Arthroscopy with Acrmioploasty, Arhtroscopic rotator cuff repair and Microfracture humeral head  . Shoulder arthroscopy with rotator cuff repair Right 07/04/2012    Procedure: SHOULDER ARTHROSCOPY WITH ROTATOR CUFF REPAIR;  Surgeon: Ninetta Lights, MD;  Location: [facility];  Service: Orthopedics;  Laterality: Right;  RIGHT SHOULDER ARTHROSCOPY WITH ARTHROSCOPIC ROTATOR CUFF REPAIR, LYSIS OF ADHESIONS  . Colonscopy         Medication List       This list is accurate as of: 07/28/14 11:02 AM.  Always use your most recent med list.               ARIPiprazole 10 MG tablet  Commonly known as:  ABILIFY  Take 1 tablet (10 mg total) by mouth at bedtime. For mood control     cetirizine 10 MG tablet  Commonly known as:  ZYRTEC   Take 1 tablet (10 mg total) by mouth daily.     cholecalciferol 1000 UNITS tablet  Commonly known as:  VITAMIN D  Take 1 tablet (1,000 Units total) by mouth daily. For bone health     citalopram 20 MG tablet  Commonly known as:  CELEXA  Take 1 tablet (20 mg total) by mouth daily. For depression     cyclobenzaprine 5 MG tablet  Commonly known as:  FLEXERIL  Take 1 tablet (5 mg total) by mouth 3 (three) times daily as needed for muscle spasms.     fluticasone 50 MCG/ACT nasal spray  Commonly known as:  FLONASE  Place 2 sprays into both nostrils daily.     ibuprofen 600 MG tablet  Commonly known as:  ADVIL,MOTRIN  Take 1 tablet (600 mg total) by mouth every 8 (eight) hours as needed.     levothyroxine 175 MCG tablet  Commonly known as:  SYNTHROID, LEVOTHROID  Take 1 tablet (175 mcg total) by mouth daily before breakfast. For low thyroid function     oxyCODONE-acetaminophen 5-325 MG per tablet  Commonly known as:  PERCOCET/ROXICET  Take by mouth every 4 (four) hours as needed for severe pain.     traMADol 50 MG tablet  Commonly known as:  ULTRAM  Take by mouth every  6 (six) hours as needed.     triamterene-hydrochlorothiazide 37.5-25 MG per tablet  Commonly known as:  MAXZIDE-25  TAKE 1 TABLET BY MOUTH DAILY. FOR HIGH BLOOD PRESSURE        Meds ordered this encounter  Medications  . cetirizine (ZYRTEC) 10 MG tablet    Sig: Take 1 tablet (10 mg total) by mouth daily.    Dispense:  30 tablet    Refill:  3  . fluticasone (FLONASE) 50 MCG/ACT nasal spray    Sig: Place 2 sprays into both nostrils daily.    Dispense:  16 g    Refill:  3    Immunization History  Administered Date(s) Administered  . Influenza Split 03/29/2011  . Influenza Whole 02/11/2008, 01/18/2010  . Influenza,inj,Quad PF,36+ Mos 01/30/2014  . Td 05/01/2006    Family History  Problem Relation Age of Onset  . Breast cancer Neg Hx   . Stroke Neg Hx   . Coronary artery disease Father 63     heart disease diagnoised  . Diabetes Father   . Hypertension Father   . Heart disease Father   . Colon cancer      great uncle at 44, great aunt dx at 81, GM age 61  . Colon polyps      2 sisters dx at age 21 and 58  . Diabetes Mother   . Hypertension Mother   . Arthritis Sister   . Cancer Maternal Grandmother   . Cancer Maternal Grandfather   . Heart disease Paternal Grandmother     History  Substance Use Topics  . Smoking status: Never Smoker   . Smokeless tobacco: Never Used  . Alcohol Use: No    Review of Systems   As noted in HPI  Filed Vitals:   07/28/14 0953  BP: 140/83  Pulse: 72  Temp: 99.1 F (37.3 C)  Resp: 16    Physical Exam  Physical Exam  HENT:  Nasal congestion, no sinus tenderness, both TMs visualized not congested. Minimal pharyngeal erythema exudate  Eyes: EOM are normal. Pupils are equal, round, and reactive to light.  Cardiovascular: Normal rate and regular rhythm.   Pulmonary/Chest: Breath sounds normal. No respiratory distress. She has no wheezes. She has no rales.    CBC    Component Value Date/Time   WBC 5.3 01/01/2014 1529   RBC 5.07 01/01/2014 1529   HGB 13.5 01/01/2014 1529   HCT 40.9 01/01/2014 1529   PLT 303 01/01/2014 1529   MCV 80.7 01/01/2014 1529   LYMPHSABS 1.7 01/03/2011 0844   MONOABS 0.3 01/03/2011 0844   EOSABS 0.2 01/03/2011 0844   BASOSABS 0.0 01/03/2011 0844    CMP     Component Value Date/Time   NA 138 06/22/2014 1230   K 4.4 06/22/2014 1230   CL 102 06/22/2014 1230   CO2 28 06/22/2014 1230   GLUCOSE 90 06/22/2014 1230   GLUCOSE 121* 04/26/2006 1348   BUN 12 06/22/2014 1230   CREATININE 0.79 06/22/2014 1230   CREATININE 0.77 01/01/2014 1529   CALCIUM 9.6 06/22/2014 1230   PROT 7.2 06/22/2014 1230   ALBUMIN 4.0 06/22/2014 1230   AST 29 06/22/2014 1230   ALT 25 06/22/2014 1230   ALKPHOS 54 06/22/2014 1230   BILITOT 0.5 06/22/2014 1230   GFRNONAA 86 06/22/2014 1230   GFRNONAA >90 01/01/2014 1529     GFRAA >89 06/22/2014 1230   GFRAA >90 01/01/2014 1529    Lab Results  Component Value Date/Time  CHOL 125 01/03/2011 08:44 AM    No components found for: HGA1C  Lab Results  Component Value Date/Time   AST 29 06/22/2014 12:30 PM    Assessment and Plan  Environmental allergies - Plan: patient will try cetirizine (ZYRTEC) 10 MG tablet, she can take Benadryl each bedtime  Nasal congestion - Plan:trial of fluticasone (FLONASE) 50 MCG/ACT nasal spray    Return in about 3 months (around 10/28/2014), or if symptoms worsen or fail to improve.   This note has been created with Surveyor, quantity. Any transcriptional errors are unintentional.    Lorayne Marek, MD

## 2014-08-18 ENCOUNTER — Encounter: Payer: Self-pay | Admitting: *Deleted

## 2014-08-18 ENCOUNTER — Ambulatory Visit: Payer: No Typology Code available for payment source | Attending: Internal Medicine | Admitting: *Deleted

## 2014-08-18 NOTE — Progress Notes (Signed)
Patient walked in complaining of dizziness.  Asked schedulers to put her in as a nurse visit.

## 2014-08-18 NOTE — Progress Notes (Unsigned)
Patient walked in complaining of dizziness.  Patient has a history of seasonal allergies and reports taking her Zyrtec but not her Flonase.  Advised patient to restart her Flonase and if she is still dizzy on Friday to come back to our walk in clinic.  Patient vitals WNL today.

## 2014-08-21 NOTE — Telephone Encounter (Deleted)
Error

## 2014-08-21 NOTE — Telephone Encounter (Signed)
Error

## 2014-08-26 ENCOUNTER — Ambulatory Visit: Payer: Self-pay

## 2014-09-14 ENCOUNTER — Encounter: Payer: Self-pay | Admitting: Gastroenterology

## 2014-11-09 ENCOUNTER — Encounter (HOSPITAL_COMMUNITY): Payer: Self-pay

## 2014-11-09 ENCOUNTER — Emergency Department (HOSPITAL_COMMUNITY)
Admission: EM | Admit: 2014-11-09 | Discharge: 2014-11-11 | Disposition: A | Discharge status: Home / Self Care | Payer: Medicare PPO | Attending: Emergency Medicine | Admitting: Emergency Medicine

## 2014-11-09 DIAGNOSIS — R45851 Suicidal ideations: Secondary | ICD-10-CM | POA: Diagnosis not present

## 2014-11-09 DIAGNOSIS — Z3202 Encounter for pregnancy test, result negative: Secondary | ICD-10-CM | POA: Insufficient documentation

## 2014-11-09 DIAGNOSIS — F333 Major depressive disorder, recurrent, severe with psychotic symptoms: Secondary | ICD-10-CM | POA: Diagnosis present

## 2014-11-09 DIAGNOSIS — M170 Bilateral primary osteoarthritis of knee: Secondary | ICD-10-CM | POA: Diagnosis not present

## 2014-11-09 DIAGNOSIS — E039 Hypothyroidism, unspecified: Secondary | ICD-10-CM | POA: Insufficient documentation

## 2014-11-09 DIAGNOSIS — I10 Essential (primary) hypertension: Secondary | ICD-10-CM | POA: Insufficient documentation

## 2014-11-09 DIAGNOSIS — F329 Major depressive disorder, single episode, unspecified: Secondary | ICD-10-CM | POA: Insufficient documentation

## 2014-11-09 DIAGNOSIS — F419 Anxiety disorder, unspecified: Secondary | ICD-10-CM | POA: Insufficient documentation

## 2014-11-09 DIAGNOSIS — Z79899 Other long term (current) drug therapy: Secondary | ICD-10-CM | POA: Insufficient documentation

## 2014-11-09 DIAGNOSIS — Z7951 Long term (current) use of inhaled steroids: Secondary | ICD-10-CM | POA: Diagnosis not present

## 2014-11-09 LAB — CBC
HEMATOCRIT: 37.3 % (ref 36.0–46.0)
HEMOGLOBIN: 11.9 g/dL — AB (ref 12.0–15.0)
MCH: 26.1 pg (ref 26.0–34.0)
MCHC: 31.9 g/dL (ref 30.0–36.0)
MCV: 81.8 fL (ref 78.0–100.0)
Platelets: 291 10*3/uL (ref 150–400)
RBC: 4.56 MIL/uL (ref 3.87–5.11)
RDW: 13.6 % (ref 11.5–15.5)
WBC: 6.2 10*3/uL (ref 4.0–10.5)

## 2014-11-09 LAB — RAPID URINE DRUG SCREEN, HOSP PERFORMED
Amphetamines: NOT DETECTED
BARBITURATES: NOT DETECTED
Benzodiazepines: NOT DETECTED
Cocaine: NOT DETECTED
Opiates: NOT DETECTED
Tetrahydrocannabinol: NOT DETECTED

## 2014-11-09 LAB — COMPREHENSIVE METABOLIC PANEL
ALBUMIN: 4.1 g/dL (ref 3.5–5.0)
ALK PHOS: 49 U/L (ref 38–126)
ALT: 23 U/L (ref 14–54)
ANION GAP: 11 (ref 5–15)
AST: 35 U/L (ref 15–41)
BUN: 14 mg/dL (ref 6–20)
CO2: 25 mmol/L (ref 22–32)
CREATININE: 0.75 mg/dL (ref 0.44–1.00)
Calcium: 9.6 mg/dL (ref 8.9–10.3)
Chloride: 99 mmol/L — ABNORMAL LOW (ref 101–111)
GFR calc Af Amer: >60 mL/min (ref >60)
GFR calc non Af Amer: >60 mL/min (ref >60)
Glucose, Bld: 112 mg/dL — ABNORMAL HIGH (ref 65–99)
Potassium: 3.7 mmol/L (ref 3.5–5.1)
Sodium: 135 mmol/L (ref 135–145)
TOTAL PROTEIN: 7.6 g/dL (ref 6.5–8.1)
Total Bilirubin: 0.4 mg/dL (ref 0.3–1.2)

## 2014-11-09 LAB — I-STAT BETA HCG BLOOD, ED (MC, WL, AP ONLY): I-stat hCG, quantitative: <5 m[IU]/mL (ref <5)

## 2014-11-09 LAB — ETHANOL: Alcohol, Ethyl (B): <5 mg/dL (ref <5)

## 2014-11-09 LAB — SALICYLATE LEVEL: Salicylate Lvl: <4 mg/dL (ref 2.8–30.0)

## 2014-11-09 LAB — ACETAMINOPHEN LEVEL: Acetaminophen (Tylenol), Serum: <10 ug/mL — ABNORMAL LOW (ref 10–30)

## 2014-11-09 MED ORDER — ARIPIPRAZOLE 10 MG PO TABS
10.0000 mg | ORAL_TABLET | Freq: Every day | ORAL | Status: DC
  Administered 2014-11-10: 10 mg via ORAL
  Filled 2014-11-09 (×3): qty 1

## 2014-11-09 MED ORDER — FLUTICASONE PROPIONATE 50 MCG/ACT NA SUSP
2.0000 | Freq: Every day | NASAL | Status: DC
  Filled 2014-11-09: qty 16

## 2014-11-09 MED ORDER — LEVOTHYROXINE SODIUM 175 MCG PO TABS
175.0000 ug | ORAL_TABLET | Freq: Every day | ORAL | Status: DC
  Administered 2014-11-10 – 2014-11-11 (×2): 175 ug via ORAL
  Filled 2014-11-09 (×3): qty 1

## 2014-11-09 MED ORDER — CYCLOBENZAPRINE HCL 10 MG PO TABS: 5.0000 mg | ORAL_TABLET | Freq: Three times a day (TID) | ORAL | Status: DC | PRN

## 2014-11-09 MED ORDER — ZOLPIDEM TARTRATE 5 MG PO TABS
5.0000 mg | ORAL_TABLET | Freq: Every evening | ORAL | Status: DC | PRN
  Administered 2014-11-09 – 2014-11-10 (×2): 5 mg via ORAL
  Filled 2014-11-09 (×2): qty 1

## 2014-11-09 MED ORDER — LORATADINE 10 MG PO TABS
10.0000 mg | ORAL_TABLET | Freq: Every day | ORAL | Status: DC
  Administered 2014-11-10 – 2014-11-11 (×2): 10 mg via ORAL
  Filled 2014-11-09 (×3): qty 1

## 2014-11-09 MED ORDER — ALUM & MAG HYDROXIDE-SIMETH 200-200-20 MG/5ML PO SUSP
30.0000 mL | ORAL | Status: DC | PRN
Start: 2014-11-09 — End: 2014-11-11

## 2014-11-09 MED ORDER — NICOTINE 21 MG/24HR TD PT24: 21.0000 mg | MEDICATED_PATCH | Freq: Every day | TRANSDERMAL | Status: DC

## 2014-11-09 MED ORDER — TRIAMTERENE-HCTZ 37.5-25 MG PO TABS
1.0000 | ORAL_TABLET | Freq: Every day | ORAL | Status: DC
  Administered 2014-11-10 – 2014-11-11 (×2): 1 via ORAL
  Filled 2014-11-09 (×2): qty 1

## 2014-11-09 MED ORDER — ACETAMINOPHEN 325 MG PO TABS: 650.0000 mg | ORAL_TABLET | ORAL | Status: DC | PRN

## 2014-11-09 MED ORDER — LORAZEPAM 0.5 MG PO TABS
1.0000 mg | ORAL_TABLET | Freq: Three times a day (TID) | ORAL | Status: DC | PRN
  Administered 2014-11-09 – 2014-11-10 (×3): 1 mg via ORAL
  Filled 2014-11-09 (×3): qty 2

## 2014-11-09 MED ORDER — BACLOFEN 10 MG PO TABS
10.0000 mg | ORAL_TABLET | Freq: Three times a day (TID) | ORAL | Status: DC | PRN
  Filled 2014-11-09: qty 1

## 2014-11-09 MED ORDER — ONDANSETRON HCL 4 MG PO TABS: 4.0000 mg | ORAL_TABLET | Freq: Three times a day (TID) | ORAL | Status: DC | PRN

## 2014-11-09 MED ORDER — IBUPROFEN 200 MG PO TABS: 600.0000 mg | ORAL_TABLET | Freq: Three times a day (TID) | ORAL | Status: DC | PRN
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
## 2014-11-09 NOTE — ED Provider Notes (Signed)
CSN: [id number]     Arrival date & time 11/09/14  1519 History   First MD Initiated Contact with Patient 11/09/14 1657     Chief Complaint  Patient presents with  . Suicidal     (Consider location/radiation/quality/duration/timing/severity/associated sxs/prior Treatment) Patient is a 54 y.o. female presenting with mental health disorder. The history is provided by the patient.  Mental Health Problem Presenting symptoms: suicidal thoughts   Degree of incapacity (severity):  Moderate Onset quality:  Gradual Timing:  Intermittent Progression:  Worsening Chronicity:  Recurrent Context: stressful life event ("things are going on that make me want to kill myself more. I don't really want to talk about it")   Context: not drug abuse, not noncompliant and not recent medication change   Relieved by:  Nothing Worsened by:  Nothing tried Associated symptoms: no abdominal pain, no appetite change and no chest pain   Risk factors: hx of mental illness     Past Medical History  Diagnosis Date  . Allergic rhinitis   . Hypothyroidism   . Hypertension   . Osteoarthritis of knee   . Osteoarthritis 2012    bilateral knees  . Complication of anesthesia     hard to wake up  . Back pain   . Depression   . Anxiety    Past Surgical History  Procedure Laterality Date  . Tubal ligation    . Rotator cuff repair      Right  . Dilation and curettage of uterus    . Shoulder arthroscopy  04/13/2011    Procedure: ARTHROSCOPY SHOULDER;  Surgeon: Ninetta Lights, MD;  Location: [facility];  Service: Orthopedics;  Laterality: Right;  Right Shoulder Arthroscopy with Acrmioploasty, Arhtroscopic rotator cuff repair and Microfracture humeral head  . Shoulder arthroscopy with rotator cuff repair Right 07/04/2012    Procedure: SHOULDER ARTHROSCOPY WITH ROTATOR CUFF REPAIR;  Surgeon: Ninetta Lights, MD;  Location: [facility];  Service: Orthopedics;  Laterality: Right;  RIGHT  SHOULDER ARTHROSCOPY WITH ARTHROSCOPIC ROTATOR CUFF REPAIR, LYSIS OF ADHESIONS  . Colonscopy      Family History  Problem Relation Age of Onset  . Breast cancer Neg Hx   . Stroke Neg Hx   . Coronary artery disease Father 23    heart disease diagnoised  . Diabetes Father   . Hypertension Father   . Heart disease Father   . Colon cancer      great uncle at 71, great aunt dx at 77, GM age 27  . Colon polyps      2 sisters dx at age 44 and 32  . Diabetes Mother   . Hypertension Mother   . Arthritis Sister   . Cancer Maternal Grandmother   . Cancer Maternal Grandfather   . Heart disease Paternal Grandmother    History  Substance Use Topics  . Smoking status: Never Smoker   . Smokeless tobacco: Never Used  . Alcohol Use: No   OB History    No data available     Review of Systems  Constitutional: Negative for fever and appetite change.  Respiratory: Negative for cough and shortness of breath.   Cardiovascular: Negative for chest pain.  Gastrointestinal: Negative for abdominal pain.  Psychiatric/Behavioral: Positive for suicidal ideas.  All other systems reviewed and are negative.     Allergies  Felodipine  Home Medications   Prior to Admission medications   Medication Sig Start Date End Date Taking? Authorizing Provider  ARIPiprazole (ABILIFY)  10 MG tablet Take 10 mg by mouth daily.   Yes Historical Provider, MD  baclofen (LIORESAL) 10 MG tablet Take 10 mg by mouth 3 (three) times daily as needed for muscle spasms.   Yes Historical Provider, MD  cholecalciferol (VITAMIN D) 1000 UNITS tablet Take 1 tablet (1,000 Units total) by mouth daily. For bone health 01/07/14  Yes Encarnacion Slates, NP  levothyroxine (SYNTHROID, LEVOTHROID) 175 MCG tablet Take 1 tablet (175 mcg total) by mouth daily before breakfast. For low thyroid function 02/06/14  Yes Deepak Advani, MD  triamterene-hydrochlorothiazide (MAXZIDE-25) 37.5-25 MG per tablet TAKE 1 TABLET BY MOUTH DAILY. FOR HIGH BLOOD  PRESSURE 06/22/14  Yes Lorayne Marek, MD  cetirizine (ZYRTEC) 10 MG tablet Take 1 tablet (10 mg total) by mouth daily. Patient not taking: Reported on 11/09/2014 07/28/14   Lorayne Marek, MD  cyclobenzaprine (FLEXERIL) 5 MG tablet Take 1 tablet (5 mg total) by mouth 3 (three) times daily as needed for muscle spasms. Patient not taking: Reported on 11/09/2014 01/30/14   Lorayne Marek, MD  fluticasone (FLONASE) 50 MCG/ACT nasal spray Place 2 sprays into both nostrils daily. Patient not taking: Reported on 08/18/2014 07/28/14   Lorayne Marek, MD  ibuprofen (ADVIL,MOTRIN) 600 MG tablet Take 1 tablet (600 mg total) by mouth every 8 (eight) hours as needed. Patient not taking: Reported on 11/09/2014 01/30/14   Lorayne Marek, MD   BP 107/70 mmHg  Pulse 80  Temp(Src) 98.1 F (36.7 C) (Oral)  Resp 18  SpO2 99%  LMP 11/07/2014 (Approximate) Physical Exam  Constitutional: She is oriented to person, place, and time. She appears well-developed and well-nourished. No distress.  HENT:  Head: Normocephalic and atraumatic.  Mouth/Throat: Oropharynx is clear and moist.  Eyes: EOM are normal. Pupils are equal, round, and reactive to light.  Neck: Normal range of motion. Neck supple.  Cardiovascular: Normal rate and regular rhythm.  Exam reveals no friction rub.   No murmur heard. Pulmonary/Chest: Effort normal and breath sounds normal. No respiratory distress. She has no wheezes. She has no rales.  Abdominal: Soft. She exhibits no distension. There is no tenderness. There is no rebound.  Musculoskeletal: Normal range of motion. She exhibits no edema.  Neurological: She is alert and oriented to person, place, and time.  Skin: She is not diaphoretic.  Psychiatric: She expresses suicidal ideation. She expresses suicidal plans.  Nursing note and vitals reviewed.   ED Course  Procedures (including critical care time) Labs Review Labs Reviewed  COMPREHENSIVE METABOLIC PANEL - Abnormal; Notable for the  following:    Chloride 99 (*)    Glucose, Bld 112 (*)    All other components within normal limits  ACETAMINOPHEN LEVEL - Abnormal; Notable for the following:    Acetaminophen (Tylenol), Serum <10 (*)    All other components within normal limits  CBC - Abnormal; Notable for the following:    Hemoglobin 11.9 (*)    All other components within normal limits  ETHANOL  SALICYLATE LEVEL  URINE RAPID DRUG SCREEN, HOSP PERFORMED  I-STAT BETA HCG BLOOD, ED ([facility], [facility], [facility] ONLY)    Imaging Review No results found.   EKG Interpretation None      MDM   Final diagnoses:  Suicidal ideation    54 year old female here with suicidal thoughts. History of the same. Plan to OD on medications. She states, "if I go back home I would do it."  Psych evaluated, will re-evaluate in the morning.    Evelina Bucy, MD  11/09/14 2253 

## 2014-11-09 NOTE — ED Notes (Signed)
Pt sitting in day room waiting on floor to be cleaned in her room.  Oriented pt to unit.  Pt alert and oriented and in no distress.  Pt will be assessed when she gets to room for privacy.

## 2014-11-09 NOTE — ED Notes (Signed)
Pt AAO x 3, no distress noted, calm & cooperative, interactive with staff.  Monitoring for safety, Q 15 min checks in effect.

## 2014-11-09 NOTE — ED Notes (Signed)
Pt has a hospital biohazard bag with a silver pandora bracelet, 4 small stud earrings, and a hair clip inside of her pocketbook which is inside of the hospital belongings bag.

## 2014-11-09 NOTE — ED Notes (Signed)
Pt wanded and belongings searched by security. 

## 2014-11-09 NOTE — Progress Notes (Signed)
CSW met with pt at bedside upon her request. Patient informed CSW that she would like her public defender to be notified that she is presently in the hospital due to her having an open case.  CSW reached out to the pt's [facility] at [phone]. However, he did not answer the phone. CSW also informed pt that during the appropriate unit hours she would be allowed to also use the phone.  Willette Brace [phone] ED CSW 11/09/2014 10:30 PM

## 2014-11-09 NOTE — ED Notes (Signed)
Unable to collect labs at this time patient in the restroom.

## 2014-11-09 NOTE — ED Notes (Signed)
Pt presents with c/o suicidal thoughts. She reports that she has been having these thoughts since last week. Pt reports she has been a pt at [facility] in the past but reports that "they cannot seem to get my medication right". Pt reports a plan to overdose on medication. Pt denies HI at this time.

## 2014-11-10 DIAGNOSIS — F333 Major depressive disorder, recurrent, severe with psychotic symptoms: Secondary | ICD-10-CM

## 2014-11-10 DIAGNOSIS — R45851 Suicidal ideations: Secondary | ICD-10-CM | POA: Insufficient documentation

## 2014-11-10 NOTE — ED Notes (Signed)
Pt AAO x 3, no distress noted, calm & cooperative, watching TV at present.  Monitoring for safety, Q 15 min checks in effect.

## 2014-11-10 NOTE — BH Assessment (Signed)
[place] Assessment Progress Note  The following facilities have been contacted to seek placement for this pt, with results as noted:  Beds available, information sent, decision pending:  [facility]  At capacity:  [facility] (reason unspecified)  Jalene Mullet, [place] Triage Specialist [phone]

## 2014-11-10 NOTE — ED Notes (Signed)
Social worker at bedside to speak with pt.

## 2014-11-10 NOTE — Progress Notes (Signed)
Pt requested CSW to fax letter stating patient is recievng care at [facility] ED, and discharge date is unknown at this time. CSW contacted Norlene Duel at [phone] and faxed requested to letter to [phone] as requested. CSW provided pt with copy of letter   Belia Heman, [place] Work  [facility] [phone]

## 2014-11-10 NOTE — Progress Notes (Signed)
Pt is active with [facility] Last seen per ov 08/18/14 per EPIC

## 2014-11-10 NOTE — Consult Note (Signed)
[facility] Consult   Reason for Consult:  Major depressive disorder, recurrent, severe with Psychotic features, suicidal ideation Referring Physician:  EDP Patient Identification: Kathleen Mcfarland MRN:  [id number] Principal Diagnosis: Severe recurrent major depressive disorder with psychotic features Diagnosis:   Patient Active Problem List   Diagnosis Date Noted  . Severe recurrent major depressive disorder with psychotic features [F33.3] 01/03/2014    Priority: High  . Cervical cancer screening [Z12.4] 02/06/2014  . Arthritis [M19.90] 01/30/2014  . Back muscle spasm [M62.830] 01/30/2014  . Other specified hypothyroidism [E03.8] 01/30/2014  . Depression [F32.9] 01/02/2014  . General medical examination [V70] 01/09/2011  . Urinary incontinence [788.3] 07/21/2010  . MENSTRUAL DISORDER [N93.9] 01/12/2009  . HYPERTENSION, BENIGN ESSENTIAL [I10] 07/31/2006  . HYPOTHYROIDISM [E03.9] 06/23/2006  . ALLERGIC RHINITIS [J30.9] 06/23/2006  . OSTEOARTHRITIS, KNEE [M17.9] 06/23/2006    Total Time spent with patient: 1 hour  Subjective:   Kathleen Mcfarland is a 54 y.o. female patient admitted with .Major depressive disorder, recurrent, severe with Psychotic features, suicidal ideation  HPI:  AA female, 54 years old was evaluated for suicidal ideation with plans to hang herself or OD on her pills.  Patient had pressured speech and was irritable during the interview.  Patient also reported that she attempted to commit suicide 6 months ago but did not say what she did.  She reports previous hx of depression and stated that her provider at [facility] get her medications right.   Patient reports poor sleep and appetite.  Patient reports hearing  Voices but could not explain what the voices are saying.  Patient is asking for her Abilify to be reduced so she can take it.  Patient believes that her Abilify makes her too sleepy.  Patient has been accepted for admission and we will  be looking for placement for her.  HPI Elements:   Location:  MDD, Recurrent severe with Psychosis, suicidal ideation. Quality:  severe. Severity:  severe. Timing:  Acute. Duration:  Chronic mental illness. Context:  Brought self in seeking treatment for her depression..  Past Medical History:  Past Medical History  Diagnosis Date  . Allergic rhinitis   . Hypothyroidism   . Hypertension   . Osteoarthritis of knee   . Osteoarthritis 2012    bilateral knees  . Complication of anesthesia     hard to wake up  . Back pain   . Depression   . Anxiety     Past Surgical History  Procedure Laterality Date  . Tubal ligation    . Rotator cuff repair      Right  . Dilation and curettage of uterus    . Shoulder arthroscopy  04/13/2011    Procedure: ARTHROSCOPY SHOULDER;  Surgeon: Ninetta Lights, MD;  Location: [place];  Service: Orthopedics;  Laterality: Right;  Right Shoulder Arthroscopy with Acrmioploasty, Arhtroscopic rotator cuff repair and Microfracture humeral head  . Shoulder arthroscopy with rotator cuff repair Right 07/04/2012    Procedure: SHOULDER ARTHROSCOPY WITH ROTATOR CUFF REPAIR;  Surgeon: Ninetta Lights, MD;  Location: [place];  Service: Orthopedics;  Laterality: Right;  RIGHT SHOULDER ARTHROSCOPY WITH ARTHROSCOPIC ROTATOR CUFF REPAIR, LYSIS OF ADHESIONS  . Colonscopy      Family History:  Family History  Problem Relation Age of Onset  . Breast cancer Neg Hx   . Stroke Neg Hx   . Coronary artery disease Father 73    heart disease diagnoised  . Diabetes Father   .  Hypertension Father   . Heart disease Father   . Colon cancer      great uncle at 100, great aunt dx at 58, GM age 36  . Colon polyps      2 sisters dx at age 56 and 27  . Diabetes Mother   . Hypertension Mother   . Arthritis Sister   . Cancer Maternal Grandmother   . Cancer Maternal Grandfather   . Heart disease Paternal Grandmother    Social History:  History   Alcohol Use No     History  Drug Use No    History   Social History  . Marital Status: Single    Spouse Name: N/A  . Number of Children: 2  . Years of Education: N/A   Occupational History  . None    Social History Main Topics  . Smoking status: Never Smoker   . Smokeless tobacco: Never Used  . Alcohol Use: No  . Drug Use: No  . Sexual Activity: Yes    Birth Control/ Protection: Other-see comments     Comment: BTL   Other Topics Concern  . None   Social History Narrative   Regular Exercise- yes 3/week   Caffeine- no   Additional Social History:                          Allergies:   Allergies  Allergen Reactions  . Felodipine Nausea And Vomiting    Labs:  Results for orders placed or performed during the hospital encounter of 11/09/14 (from the past 48 hour(s))  Urine rapid drug screen (hosp performed)     Status: None   Collection Time: 11/09/14  3:42 PM  Result Value Ref Range   Opiates NONE DETECTED NONE DETECTED   Cocaine NONE DETECTED NONE DETECTED   Benzodiazepines NONE DETECTED NONE DETECTED   Amphetamines NONE DETECTED NONE DETECTED   Tetrahydrocannabinol NONE DETECTED NONE DETECTED   Barbiturates NONE DETECTED NONE DETECTED    Comment:        DRUG SCREEN FOR MEDICAL PURPOSES ONLY.  IF CONFIRMATION IS NEEDED FOR ANY PURPOSE, NOTIFY LAB WITHIN 5 DAYS.        LOWEST DETECTABLE LIMITS FOR URINE DRUG SCREEN Drug Class       Cutoff (ng/mL) Amphetamine      1000 Barbiturate      200 Benzodiazepine   023 Tricyclics       343 Opiates          300 Cocaine          300 THC              50   Comprehensive metabolic panel     Status: Abnormal   Collection Time: 11/09/14  4:12 PM  Result Value Ref Range   Sodium 135 135 - 145 mmol/L   Potassium 3.7 3.5 - 5.1 mmol/L   Chloride 99 (L) 101 - 111 mmol/L   CO2 25 22 - 32 mmol/L   Glucose, Bld 112 (H) 65 - 99 mg/dL   BUN 14 6 - 20 mg/dL   Creatinine, Ser 0.75 0.44 - 1.00 mg/dL   Calcium  9.6 8.9 - 10.3 mg/dL   Total Protein 7.6 6.5 - 8.1 g/dL   Albumin 4.1 3.5 - 5.0 g/dL   AST 35 15 - 41 U/L   ALT 23 14 - 54 U/L   Alkaline Phosphatase 49 38 - 126 U/L   Total Bilirubin  0.4 0.3 - 1.2 mg/dL   GFR calc non Af Amer >60 >60 mL/min   GFR calc Af Amer >60 >60 mL/min    Comment: (NOTE) The eGFR has been calculated using the CKD EPI equation. This calculation has not been validated in all clinical situations. eGFR's persistently <60 mL/min signify possible Chronic Kidney Disease.    Anion gap 11 5 - 15  Ethanol (ETOH)     Status: None   Collection Time: 11/09/14  4:12 PM  Result Value Ref Range   Alcohol, Ethyl (B) <5 <5 mg/dL    Comment:        LOWEST DETECTABLE LIMIT FOR SERUM ALCOHOL IS 5 mg/dL FOR MEDICAL PURPOSES ONLY   Salicylate level     Status: None   Collection Time: 11/09/14  4:12 PM  Result Value Ref Range   Salicylate Lvl <1.1 2.8 - 30.0 mg/dL  Acetaminophen level     Status: Abnormal   Collection Time: 11/09/14  4:12 PM  Result Value Ref Range   Acetaminophen (Tylenol), Serum <10 (L) 10 - 30 ug/mL    Comment:        THERAPEUTIC CONCENTRATIONS VARY SIGNIFICANTLY. A RANGE OF 10-30 ug/mL MAY BE AN EFFECTIVE CONCENTRATION FOR MANY PATIENTS. HOWEVER, SOME ARE BEST TREATED AT CONCENTRATIONS OUTSIDE THIS RANGE. ACETAMINOPHEN CONCENTRATIONS >150 ug/mL AT 4 HOURS AFTER INGESTION AND >50 ug/mL AT 12 HOURS AFTER INGESTION ARE OFTEN ASSOCIATED WITH TOXIC REACTIONS.   CBC     Status: Abnormal   Collection Time: 11/09/14  4:12 PM  Result Value Ref Range   WBC 6.2 4.0 - 10.5 K/uL   RBC 4.56 3.87 - 5.11 MIL/uL   Hemoglobin 11.9 (L) 12.0 - 15.0 g/dL   HCT 37.3 36.0 - 46.0 %   MCV 81.8 78.0 - 100.0 fL   MCH 26.1 26.0 - 34.0 pg   MCHC 31.9 30.0 - 36.0 g/dL   RDW 13.6 11.5 - 15.5 %   Platelets 291 150 - 400 K/uL  I-Stat beta hCG blood, ED ([facility], [facility], [facility] only)     Status: None   Collection Time: 11/09/14  4:21 PM  Result Value Ref Range   I-stat hCG,  quantitative <5.0 <5 mIU/mL   Comment 3            Comment:   GEST. AGE      CONC.  (mIU/mL)   <=1 WEEK        5 - 50     2 WEEKS       50 - 500     3 WEEKS       100 - 10,000     4 WEEKS     1,000 - 30,000        FEMALE AND NON-PREGNANT FEMALE:     LESS THAN 5 mIU/mL     Vitals: Blood pressure 128/77, pulse 87, temperature 97.7 F (36.5 C), temperature source Oral, resp. rate 16, last menstrual period 11/07/2014, SpO2 100 %.  Risk to Self: Suicidal Ideation: Yes-Currently Present Suicidal Intent: Yes-Currently Present Is patient at risk for suicide?: Yes Suicidal Plan?: Yes-Currently Present (overdose ) Specify Current Suicidal Plan:  (overdose) Access to Means: Yes Specify Access to Suicidal Means:  (access to pills ) What has been your use of drugs/alcohol within the last 12 months?:  (patient denies ) How many times?:  (1x-pt overdosed less than 1 year ago ) Other Self Harm Risks:  (none reported) Triggers for Past Attempts: Other (Comment) (patient denies  previous triggers ) Intentional Self Injurious Behavior: None Risk to Others: Homicidal Ideation: No Thoughts of Harm to Others: No Current Homicidal Intent: No Current Homicidal Plan: No Access to Homicidal Means: No Identified Victim:  (n/a) History of harm to others?: No Assessment of Violence: None Noted Violent Behavior Description:  (patient calm and cooperative ) Does patient have access to weapons?: No Criminal Charges Pending?: No Does patient have a court date: Yes (today) Court Date:  (patient reports having a court today for cashing bad check ) Prior Inpatient Therapy: Prior Inpatient Therapy: Yes Prior Therapy Dates:  (01/12/2014) Prior Therapy Facilty/Provider(s):  [facility]) Reason for Treatment:  (suicide attempt, depression, anxiety, etc. ) Prior Outpatient Therapy: Prior Outpatient Therapy: No Prior Therapy Dates:  (n/a) Prior Therapy Facilty/Provider(s):  (n/a) Reason for Treatment:  (n/a) Does  patient have an ACCT team?: No Does patient have Intensive In-House Services?  : No Does patient have [facility] services? : No Does patient have [facility] services?: No  Current Facility-Administered Medications  Medication Dose Route Frequency Provider Last Rate Last Dose  . acetaminophen (TYLENOL) tablet 650 mg  650 mg Oral Q4H PRN Evelina Bucy, MD      . alum & mag hydroxide-simeth (MAALOX/MYLANTA) 200-200-20 MG/5ML suspension 30 mL  30 mL Oral PRN Evelina Bucy, MD      . ARIPiprazole (ABILIFY) tablet 10 mg  10 mg Oral Daily Evelina Bucy, MD   10 mg at 11/10/14 0948  . baclofen (LIORESAL) tablet 10 mg  10 mg Oral TID PRN Evelina Bucy, MD      . cyclobenzaprine (FLEXERIL) tablet 5 mg  5 mg Oral TID PRN Evelina Bucy, MD      . fluticasone [facility]encion [facility]) 50 MCG/ACT nasal spray 2 spray  2 spray Each Nare Daily Evelina Bucy, MD   2 spray at 11/10/14 1104  . ibuprofen (ADVIL,MOTRIN) tablet 600 mg  600 mg Oral Q8H PRN Evelina Bucy, MD      . levothyroxine (SYNTHROID, LEVOTHROID) tablet 175 mcg  175 mcg Oral QAC breakfast Evelina Bucy, MD   175 mcg at 11/10/14 0946  . loratadine (CLARITIN) tablet 10 mg  10 mg Oral Daily Evelina Bucy, MD   10 mg at 11/10/14 0948  . LORazepam (ATIVAN) tablet 1 mg  1 mg Oral Q8H PRN Evelina Bucy, MD   1 mg at 11/10/14 0951  . nicotine (NICODERM CQ - dosed in mg/24 hours) patch 21 mg  21 mg Transdermal Daily Evelina Bucy, MD      . ondansetron [facility]) tablet 4 mg  4 mg Oral Q8H PRN Evelina Bucy, MD      . triamterene-hydrochlorothiazide [facility]) 37.5-25 MG per tablet 1 tablet  1 tablet Oral Daily Evelina Bucy, MD   1 tablet at 11/10/14 0948  . zolpidem (AMBIEN) tablet 5 mg  5 mg Oral QHS PRN Evelina Bucy, MD   5 mg at 11/09/14 2135   Current Outpatient Prescriptions  Medication Sig Dispense Refill  . ARIPiprazole (ABILIFY) 10 MG tablet Take 10 mg by mouth daily.    . baclofen (LIORESAL) 10 MG tablet Take 10 mg by mouth 3 (three) times daily as needed for muscle spasms.     . cholecalciferol (VITAMIN D) 1000 UNITS tablet Take 1 tablet (1,000 Units total) by mouth daily. For bone health    . levothyroxine (SYNTHROID, LEVOTHROID) 175 MCG tablet Take 1 tablet (175 mcg total) by mouth daily before breakfast. For low thyroid function 90 tablet 3  . triamterene-hydrochlorothiazide (MAXZIDE-25) 37.5-25 MG  per tablet TAKE 1 TABLET BY MOUTH DAILY. FOR HIGH BLOOD PRESSURE 30 tablet 3  . cetirizine (ZYRTEC) 10 MG tablet Take 1 tablet (10 mg total) by mouth daily. (Patient not taking: Reported on 11/09/2014) 30 tablet 3  . cyclobenzaprine (FLEXERIL) 5 MG tablet Take 1 tablet (5 mg total) by mouth 3 (three) times daily as needed for muscle spasms. (Patient not taking: Reported on 11/09/2014) 30 tablet 1  . fluticasone (FLONASE) 50 MCG/ACT nasal spray Place 2 sprays into both nostrils daily. (Patient not taking: Reported on 08/18/2014) 16 g 3  . ibuprofen (ADVIL,MOTRIN) 600 MG tablet Take 1 tablet (600 mg total) by mouth every 8 (eight) hours as needed. (Patient not taking: Reported on 11/09/2014) 30 tablet 1    Musculoskeletal: Strength & Muscle Tone: within normal limits Gait & Station: normal Patient leans: N/A  Psychiatric Specialty Exam: Physical Exam  Review of Systems  Constitutional: Negative.   Skin: Negative.     Blood pressure 128/77, pulse 87, temperature 97.7 F (36.5 C), temperature source Oral, resp. rate 16, last menstrual period 11/07/2014, SpO2 100 %.There is no weight on file to calculate BMI.  General Appearance: Casual and Fairly Groomed  Engineer, water::  Minimal  Speech:  Clear and Coherent and Pressured  Volume:  Normal  Mood:  Angry, Anxious and Irritable  Affect:  Congruent  Thought Process:  Coherent, Goal Directed and Intact  Orientation:  Full (Time, Place, and Person)  Thought Content:  WDL  Suicidal Thoughts:  Yes.  with intent/plan  Homicidal Thoughts:  No  Memory:  Immediate;   Good Recent;   Good Remote;   Good  Judgement:  Poor   Insight:  Shallow  Psychomotor Activity:  Normal  Concentration:  Good  Recall:  NA  Fund of Knowledge:Fair  Language: Good  Akathisia:  NA  Handed:  Right  AIMS (if indicated):     Assets:  Desire for Improvement  ADL's:  Intact  Cognition: WNL  Sleep:      Medical Decision Making: Review of Psycho-Social Stressors (1)  Treatment Plan Summary: Daily contact with patient to assess and evaluate symptoms and progress in treatment and Medication management  Plan:  Continue all home medications  Disposition: Admit and seek placement.  Delfin Gant   PMHNP-BC 11/10/2014 6:23 PM Patient seen face-to-face for psychiatric evaluation, chart reviewed and case discussed with the physician extender and developed treatment plan. Reviewed the information documented and agree with the treatment plan. Corena Pilgrim, MD

## 2014-11-11 MED ORDER — TRAZODONE HCL 100 MG PO TABS: 100.0000 mg | ORAL_TABLET | Freq: Every day | ORAL | Status: DC

## 2014-11-11 MED ORDER — ARIPIPRAZOLE 15 MG PO TABS: 7.5000 mg | ORAL_TABLET | Freq: Every day | ORAL | Status: DC

## 2014-11-11 NOTE — Consult Note (Addendum)
[place] Psychiatry Consult   Reason for Consult:  Major depressive disorder, recurrent, severe with Psychotic features, suicidal ideation Referring Physician:  EDP Patient Identification: Kathleen Mcfarland MRN:  [id number] Principal Diagnosis: Severe recurrent major depressive disorder with psychotic features Diagnosis:   Patient Active Problem List   Diagnosis Date Noted  . Severe recurrent major depressive disorder with psychotic features [F33.3] 01/03/2014    Priority: High  . Suicidal ideation [R45.851]   . Cervical cancer screening [Z12.4] 02/06/2014  . Arthritis [M19.90] 01/30/2014  . Back muscle spasm [M62.830] 01/30/2014  . Other specified hypothyroidism [E03.8] 01/30/2014  . Depression [F32.9] 01/02/2014  . General medical examination [V70] 01/09/2011  . Urinary incontinence [788.3] 07/21/2010  . MENSTRUAL DISORDER [N93.9] 01/12/2009  . HYPERTENSION, BENIGN ESSENTIAL [I10] 07/31/2006  . HYPOTHYROIDISM [E03.9] 06/23/2006  . ALLERGIC RHINITIS [J30.9] 06/23/2006  . OSTEOARTHRITIS, KNEE [M17.9] 06/23/2006    Total Time spent with patient: 30 minutes  Subjective:   Kathleen Mcfarland is a 54 y.o. female patient admitted with .Major depressive disorder, recurrent, severe with Psychotic features, suicidal ideation  HPI:  Kathleen Mcfarland female, 54 years old was evaluated for suicidal ideation with plans to hang herself or OD on her pills.  Patient had pressured speech and was irritable during the interview.  Patient also reported that she attempted to commit suicide 6 months ago but did not say what she did.  She reports previous hx of depression and stated that her provider at [facility] get her medications right.   Patient reports poor sleep and appetite.  Patient reports hearing  Voices but could not explain what the voices are saying.  Patient is asking for her Abilify to be reduced so she can take it.  Patient believes that her Abilify makes her too sleepy.  Patient has  been accepted for admission and we will be looking for placement for her.  Today, patient denies SI/HI/AVH.  She stated  That she will like to follow up with [facility].  Patient reports she does not need our services.  Patient is discharged with resources.   HPI Elements:   Location:  MDD, Recurrent severe with Psychosis, suicidal ideation. Quality:  severe. Severity:  severe. Timing:  Acute. Duration:  Chronic mental illness. Context:  Brought self in seeking treatment for her depression..  Past Medical History:  Past Medical History  Diagnosis Date  . Allergic rhinitis   . Hypothyroidism   . Hypertension   . Osteoarthritis of knee   . Osteoarthritis 2012    bilateral knees  . Complication of anesthesia     hard to wake up  . Back pain   . Depression   . Anxiety     Past Surgical History  Procedure Laterality Date  . Tubal ligation    . Rotator cuff repair      Right  . Dilation and curettage of uterus    . Shoulder arthroscopy  04/13/2011    Procedure: ARTHROSCOPY SHOULDER;  Surgeon: Ninetta Lights, MD;  Location: [facility];  Service: Orthopedics;  Laterality: Right;  Right Shoulder Arthroscopy with Acrmioploasty, Arhtroscopic rotator cuff repair and Microfracture humeral head  . Shoulder arthroscopy with rotator cuff repair Right 07/04/2012    Procedure: SHOULDER ARTHROSCOPY WITH ROTATOR CUFF REPAIR;  Surgeon: Ninetta Lights, MD;  Location: [place] [facility];  Service: Orthopedics;  Laterality: Right;  RIGHT SHOULDER ARTHROSCOPY WITH ARTHROSCOPIC ROTATOR CUFF REPAIR, LYSIS OF ADHESIONS  . Colonscopy  Family History:  Family History  Problem Relation Age of Onset  . Breast cancer Neg Hx   . Stroke Neg Hx   . Coronary artery disease Father 48    heart disease diagnoised  . Diabetes Father   . Hypertension Father   . Heart disease Father   . Colon cancer      great uncle at 43, great aunt dx at 60, GM age 26  . Colon  polyps      2 sisters dx at age 34 and 42  . Diabetes Mother   . Hypertension Mother   . Arthritis Sister   . Cancer Maternal Grandmother   . Cancer Maternal Grandfather   . Heart disease Paternal Grandmother    Social History:  History  Alcohol Use No     History  Drug Use No    History   Social History  . Marital Status: Single    Spouse Name: N/A  . Number of Children: 2  . Years of Education: N/A   Occupational History  . None    Social History Main Topics  . Smoking status: Never Smoker   . Smokeless tobacco: Never Used  . Alcohol Use: No  . Drug Use: No  . Sexual Activity: Yes    Birth Control/ Protection: Other-see comments     Comment: BTL   Other Topics Concern  . None   Social History Narrative   Regular Exercise- yes 3/week   Caffeine- no   Additional Social History:                          Allergies:   Allergies  Allergen Reactions  . Felodipine Nausea And Vomiting    Labs:  Results for orders placed or performed during the hospital encounter of 11/09/14 (from the past 48 hour(s))  Urine rapid drug screen (hosp performed)     Status: None   Collection Time: 11/09/14  3:42 PM  Result Value Ref Range   Opiates NONE DETECTED NONE DETECTED   Cocaine NONE DETECTED NONE DETECTED   Benzodiazepines NONE DETECTED NONE DETECTED   Amphetamines NONE DETECTED NONE DETECTED   Tetrahydrocannabinol NONE DETECTED NONE DETECTED   Barbiturates NONE DETECTED NONE DETECTED    Comment:        DRUG SCREEN FOR MEDICAL PURPOSES ONLY.  IF CONFIRMATION IS NEEDED FOR ANY PURPOSE, NOTIFY LAB WITHIN 5 DAYS.        LOWEST DETECTABLE LIMITS FOR URINE DRUG SCREEN Drug Class       Cutoff (ng/mL) Amphetamine      1000 Barbiturate      200 Benzodiazepine   109 Tricyclics       323 Opiates          300 Cocaine          300 THC              50   Comprehensive metabolic panel     Status: Abnormal   Collection Time: 11/09/14  4:12 PM  Result Value Ref  Range   Sodium 135 135 - 145 mmol/L   Potassium 3.7 3.5 - 5.1 mmol/L   Chloride 99 (L) 101 - 111 mmol/L   CO2 25 22 - 32 mmol/L   Glucose, Bld 112 (H) 65 - 99 mg/dL   BUN 14 6 - 20 mg/dL   Creatinine, Ser 0.75 0.44 - 1.00 mg/dL   Calcium 9.6 8.9 - 10.3 mg/dL  Total Protein 7.6 6.5 - 8.1 g/dL   Albumin 4.1 3.5 - 5.0 g/dL   AST 35 15 - 41 U/L   ALT 23 14 - 54 U/L   Alkaline Phosphatase 49 38 - 126 U/L   Total Bilirubin 0.4 0.3 - 1.2 mg/dL   GFR calc non Af Amer >60 >60 mL/min   GFR calc Af Amer >60 >60 mL/min    Comment: (NOTE) The eGFR has been calculated using the CKD EPI equation. This calculation has not been validated in all clinical situations. eGFR's persistently <60 mL/min signify possible Chronic Kidney Disease.    Anion gap 11 5 - 15  Ethanol (ETOH)     Status: None   Collection Time: 11/09/14  4:12 PM  Result Value Ref Range   Alcohol, Ethyl (B) <5 <5 mg/dL    Comment:        LOWEST DETECTABLE LIMIT FOR SERUM ALCOHOL IS 5 mg/dL FOR MEDICAL PURPOSES ONLY   Salicylate level     Status: None   Collection Time: 11/09/14  4:12 PM  Result Value Ref Range   Salicylate Lvl <3.3 2.8 - 30.0 mg/dL  Acetaminophen level     Status: Abnormal   Collection Time: 11/09/14  4:12 PM  Result Value Ref Range   Acetaminophen (Tylenol), Serum <10 (L) 10 - 30 ug/mL    Comment:        THERAPEUTIC CONCENTRATIONS VARY SIGNIFICANTLY. A RANGE OF 10-30 ug/mL MAY BE AN EFFECTIVE CONCENTRATION FOR MANY PATIENTS. HOWEVER, SOME ARE BEST TREATED AT CONCENTRATIONS OUTSIDE THIS RANGE. ACETAMINOPHEN CONCENTRATIONS >150 ug/mL AT 4 HOURS AFTER INGESTION AND >50 ug/mL AT 12 HOURS AFTER INGESTION ARE OFTEN ASSOCIATED WITH TOXIC REACTIONS.   CBC     Status: Abnormal   Collection Time: 11/09/14  4:12 PM  Result Value Ref Range   WBC 6.2 4.0 - 10.5 K/uL   RBC 4.56 3.87 - 5.11 MIL/uL   Hemoglobin 11.9 (L) 12.0 - 15.0 g/dL   HCT 37.3 36.0 - 46.0 %   MCV 81.8 78.0 - 100.0 fL   MCH 26.1 26.0  - 34.0 pg   MCHC 31.9 30.0 - 36.0 g/dL   RDW 13.6 11.5 - 15.5 %   Platelets 291 150 - 400 K/uL  I-Stat beta hCG blood, ED ([facility], [facility], [facility] only)     Status: None   Collection Time: 11/09/14  4:21 PM  Result Value Ref Range   I-stat hCG, quantitative <5.0 <5 mIU/mL   Comment 3            Comment:   GEST. AGE      CONC.  (mIU/mL)   <=1 WEEK        5 - 50     2 WEEKS       50 - 500     3 WEEKS       100 - 10,000     4 WEEKS     1,000 - 30,000        FEMALE AND NON-PREGNANT FEMALE:     LESS THAN 5 mIU/mL     Vitals: Blood pressure 110/69, pulse 73, temperature 98.3 F (36.8 C), temperature source Oral, resp. rate 18, last menstrual period 11/07/2014, SpO2 100 %.  Risk to Self: Suicidal Ideation: Yes-Currently Present Suicidal Intent: Yes-Currently Present Is patient at risk for suicide?: Yes Suicidal Plan?: Yes-Currently Present (overdose ) Specify Current Suicidal Plan:  (overdose) Access to Means: Yes Specify Access to Suicidal Means:  (access to pills )  What has been your use of drugs/alcohol within the last 12 months?:  (patient denies ) How many times?:  (1x-pt overdosed less than 1 year ago ) Other Self Harm Risks:  (none reported) Triggers for Past Attempts: Other (Comment) (patient denies previous triggers ) Intentional Self Injurious Behavior: None Risk to Others: Homicidal Ideation: No Thoughts of Harm to Others: No Current Homicidal Intent: No Current Homicidal Plan: No Access to Homicidal Means: No Identified Victim:  (n/a) History of harm to others?: No Assessment of Violence: None Noted Violent Behavior Description:  (patient calm and cooperative ) Does patient have access to weapons?: No Criminal Charges Pending?: No Does patient have a court date: Yes (today) Court Date:  (patient reports having a court today for cashing bad check ) Prior Inpatient Therapy: Prior Inpatient Therapy: Yes Prior Therapy Dates:  (01/12/2014) Prior Therapy Facilty/Provider(s):   [facility]) Reason for Treatment:  (suicide attempt, depression, anxiety, etc. ) Prior Outpatient Therapy: Prior Outpatient Therapy: No Prior Therapy Dates:  (n/a) Prior Therapy Facilty/Provider(s):  (n/a) Reason for Treatment:  (n/a) Does patient have an ACCT team?: No Does patient have Intensive In-House Services?  : No Does patient have [facility] services? : No Does patient have [facility] services?: No  Current Facility-Administered Medications  Medication Dose Route Frequency Provider Last Rate Last Dose  . acetaminophen (TYLENOL) tablet 650 mg  650 mg Oral Q4H PRN Evelina Bucy, MD      . alum & mag hydroxide-simeth (MAALOX/MYLANTA) 200-200-20 MG/5ML suspension 30 mL  30 mL Oral PRN Evelina Bucy, MD      . Derrill Memo ON 11/12/2014] ARIPiprazole (ABILIFY) tablet 7.5 mg  7.5 mg Oral Daily Robin Pafford      . baclofen (LIORESAL) tablet 10 mg  10 mg Oral TID PRN Evelina Bucy, MD      . cyclobenzaprine (FLEXERIL) tablet 5 mg  5 mg Oral TID PRN Evelina Bucy, MD      . fluticasone Asencion Islam) 50 MCG/ACT nasal spray 2 spray  2 spray Each Nare Daily Evelina Bucy, MD   2 spray at 11/10/14 1104  . ibuprofen (ADVIL,MOTRIN) tablet 600 mg  600 mg Oral Q8H PRN Evelina Bucy, MD      . levothyroxine (SYNTHROID, LEVOTHROID) tablet 175 mcg  175 mcg Oral QAC breakfast Evelina Bucy, MD   175 mcg at 11/11/14 0834  . loratadine (CLARITIN) tablet 10 mg  10 mg Oral Daily Evelina Bucy, MD   10 mg at 11/11/14 0834  . LORazepam (ATIVAN) tablet 1 mg  1 mg Oral Q8H PRN Evelina Bucy, MD   1 mg at 11/10/14 2114  . nicotine (NICODERM CQ - dosed in mg/24 hours) patch 21 mg  21 mg Transdermal Daily Evelina Bucy, MD      . ondansetron [facility]) tablet 4 mg  4 mg Oral Q8H PRN Evelina Bucy, MD      . traZODone (DESYREL) tablet 100 mg  100 mg Oral QHS [employer]      . triamterene-hydrochlorothiazide (MAXZIDE-25) 37.5-25 MG per tablet 1 tablet  1 tablet Oral Daily Evelina Bucy, MD   1 tablet at 11/11/14 1610   Current Outpatient  Prescriptions  Medication Sig Dispense Refill  . ARIPiprazole (ABILIFY) 10 MG tablet Take 10 mg by mouth daily.    . baclofen (LIORESAL) 10 MG tablet Take 10 mg by mouth 3 (three) times daily as needed for muscle spasms.    . cholecalciferol (VITAMIN D) 1000 UNITS tablet Take 1 tablet (1,000 Units total) by mouth daily.  For bone health    . levothyroxine (SYNTHROID, LEVOTHROID) 175 MCG tablet Take 1 tablet (175 mcg total) by mouth daily before breakfast. For low thyroid function 90 tablet 3  . triamterene-hydrochlorothiazide (MAXZIDE-25) 37.5-25 MG per tablet TAKE 1 TABLET BY MOUTH DAILY. FOR HIGH BLOOD PRESSURE 30 tablet 3  . [START ON 11/12/2014] ARIPiprazole (ABILIFY) 15 MG tablet Take 0.5 tablets (7.5 mg total) by mouth daily. 30 tablet 0  . cetirizine (ZYRTEC) 10 MG tablet Take 1 tablet (10 mg total) by mouth daily. (Patient not taking: Reported on 11/09/2014) 30 tablet 3  . cyclobenzaprine (FLEXERIL) 5 MG tablet Take 1 tablet (5 mg total) by mouth 3 (three) times daily as needed for muscle spasms. (Patient not taking: Reported on 11/09/2014) 30 tablet 1  . fluticasone (FLONASE) 50 MCG/ACT nasal spray Place 2 sprays into both nostrils daily. (Patient not taking: Reported on 08/18/2014) 16 g 3  . ibuprofen (ADVIL,MOTRIN) 600 MG tablet Take 1 tablet (600 mg total) by mouth every 8 (eight) hours as needed. (Patient not taking: Reported on 11/09/2014) 30 tablet 1    Musculoskeletal: Strength & Muscle Tone: within normal limits Gait & Station: normal Patient leans: N/A  Psychiatric Specialty Exam: Physical Exam  Review of Systems  Constitutional: Negative.   Skin: Negative.     Blood pressure 110/69, pulse 73, temperature 98.3 F (36.8 C), temperature source Oral, resp. rate 18, last menstrual period 11/07/2014, SpO2 100 %.There is no weight on file to calculate BMI.  General Appearance: Casual and Fairly Groomed  Engineer, water::  Minimal  Speech:  Clear and Coherent and Pressured  Volume:   Normal  Mood:  Irritable  Affect:  Congruent  Thought Process:  Coherent, Goal Directed and Intact  Orientation:  Full (Time, Place, and Person)  Thought Content:  WDL  Suicidal Thoughts:  No  Homicidal Thoughts:  No  Memory:  Immediate;   Good Recent;   Good Remote;   Good  Judgement:  Poor  Insight:  Shallow  Psychomotor Activity:  Normal  Concentration:  Good  Recall:  NA  Fund of Knowledge:Fair  Language: Good  Akathisia:  NA  Handed:  Right  AIMS (if indicated):     Assets:  Desire for Improvement  ADL's:  Intact  Cognition: WNL  Sleep:      Medical Decision Making: Review of Psycho-Social Stressors (1)  Disposition:  Discharge home  Delfin Gant   PMHNP-BC 11/11/2014 10:15 AM Patient seen face-to-face for psychiatric evaluation, chart reviewed and case discussed with the physician extender and developed treatment plan. Reviewed the information documented and agree with the treatment plan. Corena Pilgrim, MD

## 2014-11-11 NOTE — Discharge Instructions (Signed)
For your ongoing behavioral health needs you are advised to follow up with [facility].  New patients are seen at their walk-in clinic.  Walk-in hours are Monday - Friday from 8:00 am - 12:00 pm, and from 1:00 pm - 3:00 pm.  Walk-in patients are seen on a first come, first served basis, so try to arrive as early as possible for the best chance of being seen the same day.  There is an initial fee of $22.50:       [facility]      [place], [place] [address]      [phone]

## 2014-11-11 NOTE — [facility] Suicide Risk Assessment (Cosign Needed)
Suicide Risk Assessment  Discharge Assessment   [facility] Discharge Suicide Risk Assessment   Demographic Factors:  Low socioeconomic status and Unemployed  Total Time spent with patient: 20 minutes  Musculoskeletal: Strength & Muscle Tone: within normal limits Gait & Station: normal Patient leans: N/A  Psychiatric Specialty Exam:     Blood pressure 110/69, pulse 73, temperature 98.3 F (36.8 C), temperature source Oral, resp. rate 18, last menstrual period 11/07/2014, SpO2 100 %.There is no weight on file to calculate BMI.  General Appearance: Casual and Fairly Groomed  Engineer, water:: Minimal  Speech: Clear and Coherent and Pressured  Volume: Normal  Mood: Irritable  Affect: Congruent  Thought Process: Coherent, Goal Directed and Intact  Orientation: Full (Time, Place, and Person)  Thought Content: WDL  Suicidal Thoughts: No  Homicidal Thoughts: No  Memory: Immediate; Good Recent; Good Remote; Good  Judgement: Poor  Insight: Shallow  Psychomotor Activity: Normal  Concentration: Good  Recall: NA  Fund of Knowledge:Fair  Language: Good  Akathisia: NA  Handed: Right  AIMS (if indicated):    Assets: Desire for Improvement  ADL's: Intact  Cognition: WNL  Sleep:               Has this patient used any form of tobacco in the last 30 days? (Cigarettes, Smokeless Tobacco, Cigars, and/or Pipes) N/A  Mental Status Per Nursing Assessment::   On Admission:     Current Mental Status by Physician: NA  Loss Factors: NA  Historical Factors: NA  Risk Reduction Factors:   Living with another person, especially a relative  Continued Clinical Symptoms:  Bipolar Disorder:   Mixed State  Cognitive Features That Contribute To Risk:  Polarized thinking    Suicide Risk:  Minimal: No identifiable suicidal ideation.  Patients presenting with no risk factors but with morbid ruminations; may be classified as minimal risk  based on the severity of the depressive symptoms  Principal Problem: Severe recurrent major depressive disorder with psychotic features Discharge Diagnoses:  Patient Active Problem List   Diagnosis Date Noted  . Severe recurrent major depressive disorder with psychotic features [F33.3] 01/03/2014    Priority: High  . Suicidal ideation [R45.851]   . Cervical cancer screening [Z12.4] 02/06/2014  . Arthritis [M19.90] 01/30/2014  . Back muscle spasm [M62.830] 01/30/2014  . Other specified hypothyroidism [E03.8] 01/30/2014  . Depression [F32.9] 01/02/2014  . General medical examination [V70] 01/09/2011  . Urinary incontinence [788.3] 07/21/2010  . MENSTRUAL DISORDER [N93.9] 01/12/2009  . HYPERTENSION, BENIGN ESSENTIAL [I10] 07/31/2006  . HYPOTHYROIDISM [E03.9] 06/23/2006  . ALLERGIC RHINITIS [J30.9] 06/23/2006  . OSTEOARTHRITIS, KNEE [M17.9] 06/23/2006      Plan Of Care/Follow-up recommendations:  Activity:  AS TOLERATED Diet:  regular  Is patient on multiple antipsychotic therapies at discharge:  No   Has Patient had three or more failed trials of antipsychotic monotherapy by history:  No  Recommended Plan for Multiple Antipsychotic Therapies: NA    Virgil Lightner C  PMHNP-BC 11/11/2014, 10:21 AM

## 2014-11-11 NOTE — ED Notes (Signed)
Pt d/c and has her car at the hospital. All items returned. Pt denies si and hi.

## 2014-11-11 NOTE — ED Notes (Signed)
Pt is alert and oriented wanting to leave. Pt denies si, hi and hallucinations. Pt is irritable with c/o about her bed being uncomfortable, the food and waiting for discharge. Pt has been given resources for follow up stating that she has insurance and can find somewhere for follow up herself.

## 2014-11-11 NOTE — BH Assessment (Signed)
[place] Assessment Progress Note  Per Corena Pilgrim, MD, this pt does not require psychiatric hospitalization at this time.  She is to be discharged from [facility] with outpatient referrals.  Pt's discharged instructions include referral information for [facility] [place].  Pt's nurse has been notified.  Jalene Mullet, [place] Triage Specialist [phone]

## 2014-11-30 ENCOUNTER — Encounter: Payer: Self-pay | Admitting: Internal Medicine

## 2014-11-30 ENCOUNTER — Ambulatory Visit: Payer: Medicare PPO | Attending: Internal Medicine | Admitting: Internal Medicine

## 2014-11-30 VITALS — BP 112/78 | HR 93 | Temp 98.0°F | Resp 16 | Wt 225.8 lb

## 2014-11-30 DIAGNOSIS — F419 Anxiety disorder, unspecified: Secondary | ICD-10-CM | POA: Insufficient documentation

## 2014-11-30 DIAGNOSIS — E039 Hypothyroidism, unspecified: Secondary | ICD-10-CM | POA: Insufficient documentation

## 2014-11-30 DIAGNOSIS — Z1211 Encounter for screening for malignant neoplasm of colon: Secondary | ICD-10-CM

## 2014-11-30 DIAGNOSIS — F329 Major depressive disorder, single episode, unspecified: Secondary | ICD-10-CM | POA: Insufficient documentation

## 2014-11-30 DIAGNOSIS — E038 Other specified hypothyroidism: Secondary | ICD-10-CM

## 2014-11-30 DIAGNOSIS — Z79899 Other long term (current) drug therapy: Secondary | ICD-10-CM | POA: Insufficient documentation

## 2014-11-30 DIAGNOSIS — I10 Essential (primary) hypertension: Secondary | ICD-10-CM | POA: Diagnosis not present

## 2014-11-30 LAB — COMPLETE METABOLIC PANEL WITH GFR
ALT: 20 U/L (ref 6–29)
AST: 23 U/L (ref 10–35)
Albumin: 4.1 g/dL (ref 3.6–5.1)
Alkaline Phosphatase: 48 U/L (ref 33–130)
BILIRUBIN TOTAL: 0.3 mg/dL (ref 0.2–1.2)
BUN: 10 mg/dL (ref 7–25)
CO2: 27 mmol/L (ref 20–31)
Calcium: 10.2 mg/dL (ref 8.6–10.4)
Chloride: 101 mmol/L (ref 98–110)
Creat: 0.87 mg/dL (ref 0.50–1.05)
GFR, EST NON AFRICAN AMERICAN: 77 mL/min (ref >=60)
GFR, Est African American: 89 mL/min (ref >=60)
GLUCOSE: 96 mg/dL (ref 65–99)
Potassium: 4.6 mmol/L (ref 3.5–5.3)
SODIUM: 138 mmol/L (ref 135–146)
Total Protein: 7 g/dL (ref 6.1–8.1)

## 2014-11-30 LAB — TSH: TSH: 7.504 u[IU]/mL — ABNORMAL HIGH (ref 0.350–4.500)

## 2014-11-30 NOTE — Progress Notes (Signed)
Patient here for follow up Patient is requesting something to help with her menopause

## 2014-11-30 NOTE — Progress Notes (Signed)
MRN: [id number] Name: Kathleen Mcfarland  Sex: female Age: 54 y.o. DOB: 06/10/1960  Allergies: Felodipine  Chief Complaint  Patient presents with  . Follow-up    HPI: Patient is 54 y.o. female who has to of hypertension, hypothyroidism, patient comes today complaints of sweating a lot, has lost a few pounds, concerned about having menopausal symptoms, as per patient her last menstrual period was 3 weeks ago, she is also taking her thyroid medication, she denies any change in bowel habits denies any chest and shortness of breath denies any headache dizziness. Patient is compliant in taking her blood pressure medication which is well controlled.  Past Medical History  Diagnosis Date  . Allergic rhinitis   . Hypothyroidism   . Hypertension   . Osteoarthritis of knee   . Osteoarthritis 2012    bilateral knees  . Complication of anesthesia     hard to wake up  . Back pain   . Depression   . Anxiety     Past Surgical History  Procedure Laterality Date  . Tubal ligation    . Rotator cuff repair      Right  . Dilation and curettage of uterus    . Shoulder arthroscopy  04/13/2011    Procedure: ARTHROSCOPY SHOULDER;  Surgeon: Ninetta Lights, MD;  Location: [place];  Service: Orthopedics;  Laterality: Right;  Right Shoulder Arthroscopy with Acrmioploasty, Arhtroscopic rotator cuff repair and Microfracture humeral head  . Shoulder arthroscopy with rotator cuff repair Right 07/04/2012    Procedure: SHOULDER ARTHROSCOPY WITH ROTATOR CUFF REPAIR;  Surgeon: Ninetta Lights, MD;  Location: [place];  Service: Orthopedics;  Laterality: Right;  RIGHT SHOULDER ARTHROSCOPY WITH ARTHROSCOPIC ROTATOR CUFF REPAIR, LYSIS OF ADHESIONS  . Colonscopy         Medication List       This list is accurate as of: 11/30/14 11:33 AM.  Always use your most recent med list.               ARIPiprazole 15 MG tablet  Commonly known as:  ABILIFY  Take 0.5 tablets (7.5  mg total) by mouth daily.     baclofen 10 MG tablet  Commonly known as:  LIORESAL  Take 10 mg by mouth 3 (three) times daily as needed for muscle spasms.     cetirizine 10 MG tablet  Commonly known as:  ZYRTEC  Take 1 tablet (10 mg total) by mouth daily.     cholecalciferol 1000 UNITS tablet  Commonly known as:  VITAMIN D  Take 1 tablet (1,000 Units total) by mouth daily. For bone health     fluticasone 50 MCG/ACT nasal spray  Commonly known as:  FLONASE  Place 2 sprays into both nostrils daily.     ibuprofen 600 MG tablet  Commonly known as:  ADVIL,MOTRIN  Take 1 tablet (600 mg total) by mouth every 8 (eight) hours as needed.     levothyroxine 175 MCG tablet  Commonly known as:  SYNTHROID, LEVOTHROID  Take 1 tablet (175 mcg total) by mouth daily before breakfast. For low thyroid function     triamterene-hydrochlorothiazide 37.5-25 MG per tablet  Commonly known as:  MAXZIDE-25  TAKE 1 TABLET BY MOUTH DAILY. FOR HIGH BLOOD PRESSURE        No orders of the defined types were placed in this encounter.    Immunization History  Administered Date(s) Administered  . Influenza Split 03/29/2011  . Influenza Whole 02/11/2008,  01/18/2010  . Influenza,inj,Quad PF,36+ Mos 01/30/2014  . Td 05/01/2006    Family History  Problem Relation Age of Onset  . Breast cancer Neg Hx   . Stroke Neg Hx   . Coronary artery disease Father 88    heart disease diagnoised  . Diabetes Father   . Hypertension Father   . Heart disease Father   . Colon cancer      great uncle at 9, great aunt dx at 11, GM age 87  . Colon polyps      2 sisters dx at age 11 and 16  . Diabetes Mother   . Hypertension Mother   . Arthritis Sister   . Cancer Maternal Grandmother   . Cancer Maternal Grandfather   . Heart disease Paternal Grandmother     History  Substance Use Topics  . Smoking status: Never Smoker   . Smokeless tobacco: Never Used  . Alcohol Use: No    Review of Systems   As noted in  HPI  Filed Vitals:   11/30/14 1115  BP: 112/78  Pulse: 93  Temp: 98 F (36.7 C)  Resp: 16    Physical Exam  Physical Exam  Constitutional: No distress.  Eyes: EOM are normal. Pupils are equal, round, and reactive to light.  Cardiovascular: Normal rate and regular rhythm.   Pulmonary/Chest: Breath sounds normal. No respiratory distress. She has no wheezes. She has no rales.  Musculoskeletal: She exhibits no edema.    Labs   Lab Results  Component Value Date   WBC 6.2 11/09/2014   HGB 11.9* 11/09/2014   HCT 37.3 11/09/2014   PLT 291 11/09/2014   GLUCOSE 112* 11/09/2014   CHOL 125 01/03/2011   TRIG 42.0 01/03/2011   HDL 36.80* 01/03/2011   LDLCALC 80 01/03/2011   ALT 23 11/09/2014   AST 35 11/09/2014   NA 135 11/09/2014   K 3.7 11/09/2014   CL 99* 11/09/2014   CREATININE 0.75 11/09/2014   BUN 14 11/09/2014   CO2 25 11/09/2014   TSH 0.667 06/22/2014   HGBA1C 6.0* 01/30/2014    Lab Results  Component Value Date   HGBA1C 6.0* 01/30/2014     Assessment and Plan  HYPERTENSION, BENIGN ESSENTIAL - Plan: a pressure is well controlled continue with DASH diet, current meds, repeat blood chemistry COMPLETE METABOLIC PANEL WITH GFR  Other specified hypothyroidism - Plan: will repeat her TSH level , currently patient is taking levothyroxine on 175 mcg daily  Special screening for malignant neoplasms, colon - Plan: Ambulatory referral to Gastroenterology   Health Maintenance -Colonoscopy: referred to GI. -Pap Smear: patient to follow with her OB/GYN. -Mammogram:up-to-date   Return in about 3 months (around 03/02/2015), or if symptoms worsen or fail to improve.   This note has been created with Surveyor, quantity. Any transcriptional errors are unintentional.    Lorayne Marek, MD

## 2014-12-01 ENCOUNTER — Telehealth: Payer: Self-pay

## 2014-12-01 DIAGNOSIS — E038 Other specified hypothyroidism: Secondary | ICD-10-CM

## 2014-12-01 MED ORDER — LEVOTHYROXINE SODIUM 200 MCG PO TABS: 200.0000 ug | ORAL_TABLET | Freq: Every day | ORAL | Status: DC

## 2014-12-01 NOTE — Telephone Encounter (Signed)
Home number not accepting phone call Message left on cell voice mail to return our call

## 2014-12-01 NOTE — Telephone Encounter (Signed)
-----   Message from Lorayne Marek, MD sent at 12/01/2014  9:45 AM EDT ----- Call and let the patient know that her TSH level is abnormal, currently patient is taking levothyroxine 175 mcg , advise patient to increase the dose of levothyroxine to 200 mcg daily, will repeat TSH level in 6-8 weeks.

## 2014-12-02 ENCOUNTER — Telehealth: Payer: Self-pay

## 2014-12-02 NOTE — Telephone Encounter (Signed)
Returned patient phone and she is aware of her lab results And to pick up her new prescription for levothyroxine

## 2014-12-14 ENCOUNTER — Other Ambulatory Visit: Payer: Self-pay | Admitting: Internal Medicine

## 2014-12-15 ENCOUNTER — Encounter (HOSPITAL_COMMUNITY): Payer: Self-pay | Admitting: Emergency Medicine

## 2014-12-15 ENCOUNTER — Emergency Department (HOSPITAL_COMMUNITY)
Admission: EM | Admit: 2014-12-15 | Discharge: 2014-12-16 | Disposition: A | Discharge status: Rehabilitation Facility | Payer: Medicare PPO | Attending: Emergency Medicine | Admitting: Emergency Medicine

## 2014-12-15 DIAGNOSIS — E039 Hypothyroidism, unspecified: Secondary | ICD-10-CM | POA: Insufficient documentation

## 2014-12-15 DIAGNOSIS — R45851 Suicidal ideations: Secondary | ICD-10-CM | POA: Diagnosis present

## 2014-12-15 DIAGNOSIS — F329 Major depressive disorder, single episode, unspecified: Secondary | ICD-10-CM | POA: Diagnosis not present

## 2014-12-15 DIAGNOSIS — Z79899 Other long term (current) drug therapy: Secondary | ICD-10-CM | POA: Insufficient documentation

## 2014-12-15 DIAGNOSIS — I10 Essential (primary) hypertension: Secondary | ICD-10-CM | POA: Insufficient documentation

## 2014-12-15 DIAGNOSIS — Z8739 Personal history of other diseases of the musculoskeletal system and connective tissue: Secondary | ICD-10-CM | POA: Diagnosis not present

## 2014-12-15 LAB — CBC
HEMATOCRIT: 39.2 % (ref 36.0–46.0)
HEMOGLOBIN: 12.6 g/dL (ref 12.0–15.0)
MCH: 26.4 pg (ref 26.0–34.0)
MCHC: 32.1 g/dL (ref 30.0–36.0)
MCV: 82 fL (ref 78.0–100.0)
Platelets: 266 10*3/uL (ref 150–400)
RBC: 4.78 MIL/uL (ref 3.87–5.11)
RDW: 13.8 % (ref 11.5–15.5)
WBC: 5.5 10*3/uL (ref 4.0–10.5)

## 2014-12-15 LAB — COMPREHENSIVE METABOLIC PANEL
ALBUMIN: 3.8 g/dL (ref 3.5–5.0)
ALK PHOS: 48 U/L (ref 38–126)
ALT: 23 U/L (ref 14–54)
ANION GAP: 8 (ref 5–15)
AST: 31 U/L (ref 15–41)
BUN: 7 mg/dL (ref 6–20)
CHLORIDE: 102 mmol/L (ref 101–111)
CO2: 27 mmol/L (ref 22–32)
Calcium: 9.4 mg/dL (ref 8.9–10.3)
Creatinine, Ser: 0.71 mg/dL (ref 0.44–1.00)
GFR calc Af Amer: >60 mL/min (ref >60)
GFR calc non Af Amer: >60 mL/min (ref >60)
GLUCOSE: 102 mg/dL — AB (ref 65–99)
Potassium: 3.5 mmol/L (ref 3.5–5.1)
SODIUM: 137 mmol/L (ref 135–145)
Total Bilirubin: 0.5 mg/dL (ref 0.3–1.2)
Total Protein: 7.1 g/dL (ref 6.5–8.1)

## 2014-12-15 LAB — ACETAMINOPHEN LEVEL

## 2014-12-15 LAB — SALICYLATE LEVEL: Salicylate Lvl: <4 mg/dL (ref 2.8–30.0)

## 2014-12-15 LAB — ETHANOL: Alcohol, Ethyl (B): <5 mg/dL (ref <5)

## 2014-12-15 MED ORDER — ALUM & MAG HYDROXIDE-SIMETH 200-200-20 MG/5ML PO SUSP: 30.0000 mL | ORAL | Status: DC | PRN

## 2014-12-15 MED ORDER — ZOLPIDEM TARTRATE 5 MG PO TABS
5.0000 mg | ORAL_TABLET | Freq: Every evening | ORAL | Status: DC | PRN
  Administered 2014-12-15: 5 mg via ORAL
  Filled 2014-12-15: qty 1

## 2014-12-15 MED ORDER — LORAZEPAM 1 MG PO TABS
1.0000 mg | ORAL_TABLET | Freq: Three times a day (TID) | ORAL | Status: DC | PRN
Start: 2014-12-15 — End: 2014-12-16
  Administered 2014-12-15: 1 mg via ORAL
  Filled 2014-12-15: qty 1

## 2014-12-15 MED ORDER — ARIPIPRAZOLE 15 MG PO TABS
7.5000 mg | ORAL_TABLET | Freq: Every day | ORAL | Status: DC
  Administered 2014-12-16: 7.5 mg via ORAL
  Filled 2014-12-15 (×2): qty 1

## 2014-12-15 MED ORDER — IBUPROFEN 200 MG PO TABS
600.0000 mg | ORAL_TABLET | Freq: Three times a day (TID) | ORAL | Status: DC | PRN
  Administered 2014-12-15: 600 mg via ORAL
  Filled 2014-12-15 (×2): qty 1

## 2014-12-15 MED ORDER — NICOTINE 21 MG/24HR TD PT24: 21.0000 mg | MEDICATED_PATCH | Freq: Every day | TRANSDERMAL | Status: DC

## 2014-12-15 MED ORDER — ACETAMINOPHEN 325 MG PO TABS: 650.0000 mg | ORAL_TABLET | ORAL | Status: DC | PRN

## 2014-12-15 MED ORDER — LEVOTHYROXINE SODIUM 200 MCG PO TABS
200.0000 ug | ORAL_TABLET | Freq: Every day | ORAL | Status: DC
  Administered 2014-12-16: 200 ug via ORAL
  Filled 2014-12-15 (×2): qty 1

## 2014-12-15 MED ORDER — BACLOFEN 10 MG PO TABS
10.0000 mg | ORAL_TABLET | Freq: Three times a day (TID) | ORAL | Status: DC | PRN
  Filled 2014-12-15: qty 1

## 2014-12-15 MED ORDER — ONDANSETRON HCL 4 MG PO TABS
4.0000 mg | ORAL_TABLET | Freq: Three times a day (TID) | ORAL | Status: DC | PRN
Start: 2014-12-15 — End: 2014-12-16

## 2014-12-15 MED ORDER — TRIAMTERENE-HCTZ 37.5-25 MG PO TABS
1.0000 | ORAL_TABLET | Freq: Every day | ORAL | Status: DC
  Administered 2014-12-16: 1 via ORAL
  Filled 2014-12-15 (×2): qty 1

## 2014-12-15 NOTE — Progress Notes (Signed)
CSW prepared letter for pt's attorney re: her admission to ED.  Letter faxed.  Copy given to pt.
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
## 2014-12-15 NOTE — ED Notes (Signed)
Staffing called.

## 2014-12-15 NOTE — ED Notes (Signed)
Patient requesting something for her headache, something for sleep and something for anxiety.  Stated "I heard something that has me upset".  Stated she is getting her appetite back and requested some snacks.  Peanut butter, graham crackers and cookies given.

## 2014-12-15 NOTE — Clinical Social Work Note (Signed)
CSW called and spoke with referring MD Dr. Mingo Amber.  He reported patient is "wanting to taken an overdose".  He also relayed information about an upcoming court date that patient has.  Called and spoke with RN who will put the computer in front of patient shortly.  CSW will call back to completed tele psyc assessment  .Dede Query, [place] [facility]

## 2014-12-15 NOTE — Clinical Social Work Note (Signed)
CSW called and spoke with Dr. Mingo Amber to update him on the recommendation of inpatient treatment for pt.  Dede Query, LCSW [facility]

## 2014-12-15 NOTE — ED Provider Notes (Signed)
CSN: [id number]     Arrival date & time 12/15/14  1204 History   First MD Initiated Contact with Patient 12/15/14 1605     Chief Complaint  Patient presents with  . Suicidal     (Consider location/radiation/quality/duration/timing/severity/associated sxs/prior Treatment) Patient is a 54 y.o. female presenting with mental health disorder. The history is provided by the patient.  Mental Health Problem Presenting symptoms: suicidal thoughts   Degree of incapacity (severity):  Moderate Onset quality:  Gradual Duration:  2 days Timing:  Constant Progression:  Unchanged Chronicity:  Recurrent Context: stressful life event ("everything is coming together and causing stress" - patient states due to legal matters)   Context: not recent medication change   Relieved by:  Nothing Worsened by:  Nothing tried   Past Medical History  Diagnosis Date  . Allergic rhinitis   . Hypothyroidism   . Hypertension   . Osteoarthritis of knee   . Osteoarthritis 2012    bilateral knees  . Complication of anesthesia     hard to wake up  . Back pain   . Depression   . Anxiety    Past Surgical History  Procedure Laterality Date  . Tubal ligation    . Rotator cuff repair      Right  . Dilation and curettage of uterus    . Shoulder arthroscopy  04/13/2011    Procedure: ARTHROSCOPY SHOULDER;  Surgeon: Ninetta Lights, MD;  Location: [place];  Service: Orthopedics;  Laterality: Right;  Right Shoulder Arthroscopy with Acrmioploasty, Arhtroscopic rotator cuff repair and Microfracture humeral head  . Shoulder arthroscopy with rotator cuff repair Right 07/04/2012    Procedure: SHOULDER ARTHROSCOPY WITH ROTATOR CUFF REPAIR;  Surgeon: Ninetta Lights, MD;  Location: [place];  Service: Orthopedics;  Laterality: Right;  RIGHT SHOULDER ARTHROSCOPY WITH ARTHROSCOPIC ROTATOR CUFF REPAIR, LYSIS OF ADHESIONS  . Colonscopy      Family History  Problem Relation Age of Onset  .  Breast cancer Neg Hx   . Stroke Neg Hx   . Coronary artery disease Father 52    heart disease diagnoised  . Diabetes Father   . Hypertension Father   . Heart disease Father   . Colon cancer      great uncle at 2, great aunt dx at 57, GM age 52  . Colon polyps      2 sisters dx at age 22 and 110  . Diabetes Mother   . Hypertension Mother   . Arthritis Sister   . Cancer Maternal Grandmother   . Cancer Maternal Grandfather   . Heart disease Paternal Grandmother    Social History  Substance Use Topics  . Smoking status: Never Smoker   . Smokeless tobacco: Never Used  . Alcohol Use: No   OB History    No data available     Review of Systems  Constitutional: Negative for fever.  Respiratory: Negative for shortness of breath.   Gastrointestinal: Negative for vomiting.  Psychiatric/Behavioral: Positive for suicidal ideas.  All other systems reviewed and are negative.     Allergies  Felodipine  Home Medications   Prior to Admission medications   Medication Sig Start Date End Date Taking? Authorizing Provider  ARIPiprazole (ABILIFY) 15 MG tablet Take 0.5 tablets (7.5 mg total) by mouth daily. 11/12/14   Delfin Gant, NP  baclofen (LIORESAL) 10 MG tablet Take 10 mg by mouth 3 (three) times daily as needed for muscle spasms.  Historical Provider, MD  cetirizine (ZYRTEC) 10 MG tablet Take 1 tablet (10 mg total) by mouth daily. Patient not taking: Reported on 11/09/2014 07/28/14   Lorayne Marek, MD  cholecalciferol (VITAMIN D) 1000 UNITS tablet Take 1 tablet (1,000 Units total) by mouth daily. For bone health 01/07/14   Encarnacion Slates, NP  fluticasone [facility]) 50 MCG/ACT nasal spray Place 2 sprays into both nostrils daily. Patient not taking: Reported on 08/18/2014 07/28/14   Lorayne Marek, MD  ibuprofen (ADVIL,MOTRIN) 600 MG tablet Take 1 tablet (600 mg total) by mouth every 8 (eight) hours as needed. Patient not taking: Reported on 11/09/2014 01/30/14   Lorayne Marek, MD   levothyroxine (SYNTHROID, LEVOTHROID) 200 MCG tablet Take 1 tablet (200 mcg total) by mouth daily before breakfast. For low thyroid function 12/01/14   Lorayne Marek, MD  triamterene-hydrochlorothiazide (MAXZIDE-25) 37.5-25 MG per tablet TAKE 1 TABLET BY MOUTH DAILY. FOR HIGH BLOOD PRESSURE 06/22/14   Lorayne Marek, MD   BP 131/78 mmHg  Pulse 73  Temp(Src) 98.7 F (37.1 C) (Oral)  Resp 16  Ht 5\' 7"  (1.702 m)  Wt 225 lb (102.059 kg)  BMI 35.23 kg/m2  SpO2 99%  LMP 12/06/2014 Physical Exam  Constitutional: She is oriented to person, place, and time. She appears well-developed and well-nourished. No distress.  HENT:  Head: Normocephalic and atraumatic.  Mouth/Throat: Oropharynx is clear and moist.  Eyes: EOM are normal. Pupils are equal, round, and reactive to light.  Neck: Normal range of motion. Neck supple.  Cardiovascular: Normal rate and regular rhythm.  Exam reveals no friction rub.   No murmur heard. Pulmonary/Chest: Effort normal and breath sounds normal. No respiratory distress. She has no wheezes. She has no rales.  Abdominal: Soft. She exhibits no distension. There is no tenderness. There is no rebound.  Musculoskeletal: Normal range of motion. She exhibits no edema.  Neurological: She is alert and oriented to person, place, and time. No cranial nerve deficit. She exhibits normal muscle tone. Coordination normal.  Skin: No rash noted. She is not diaphoretic.  Psychiatric: She expresses suicidal ideation. She expresses no homicidal ideation. She expresses suicidal plans. She expresses no homicidal plans.  Nursing note and vitals reviewed.   ED Course  Procedures (including critical care time) Labs Review Labs Reviewed  COMPREHENSIVE METABOLIC PANEL - Abnormal; Notable for the following:    Glucose, Bld 102 (*)    All other components within normal limits  ACETAMINOPHEN LEVEL - Abnormal; Notable for the following:    Acetaminophen (Tylenol), Serum <10 (*)    All other  components within normal limits  ETHANOL  SALICYLATE LEVEL  CBC  URINE RAPID DRUG SCREEN, HOSP PERFORMED    Imaging Review No results found. I have personally reviewed and evaluated these images and lab results as part of my medical decision-making.   EKG Interpretation None      MDM   Final diagnoses:  Suicidal ideation    54 year old female here suicidal. Has plan to overdose. History of suicidality but no history of suicide attempt. Here cooperative, calm. No homicidal ideation. TTS and social worker consulted. Plan to keep her for further eval.   Evelina Bucy, MD 12/15/14 [phone]

## 2014-12-15 NOTE — ED Notes (Signed)
Patient accepted at [facility], per [facility]. Patient is going to [address] [place], accepting Dr. Mina Marble. Pt can go tomorrow 8/17 after 9am.  Call report at [phone].

## 2014-12-15 NOTE — Progress Notes (Signed)
Patient accepted at [facility], per [place].  Patient is going to [address] [place], accepting Dr. Mina Marble. Patient to arrive tomorrow on 8/17 at 9am. Call report at [phone].  RN Lenna Sciara was informed.  Verlon Setting, [employer] Disposition staff 12/15/2014 10:42 PM

## 2014-12-15 NOTE — ED Notes (Signed)
Pt up to phone.

## 2014-12-15 NOTE — ED Notes (Signed)
Pt reports been depressed and hearing voices telling her to end her life. Thoughts of SI for several days. No plan.

## 2014-12-15 NOTE — Progress Notes (Addendum)
Patient was referred at: [facility] - per Butch Penny, 1 bed open, fax referral. [facility] - per Gerald Stabs, fax referral for review. [facility] per Juliann Pulse, fax referral. [facility] - per intake, fax it for wait-list. [facility] - per Burman Nieves, fax referral for wait-list. [facility] - per Pamala Hurry, fax referral for review. [facility] - per intake, fax it for review.  At capacity: [facility] - per Lamar Ben[facility] - per Audree Bane - per [facility]  CSW will continue to seek placement.  Verlon Setting, [place] Disposition staff 12/15/2014 8:09 PM

## 2014-12-16 ENCOUNTER — Encounter (HOSPITAL_COMMUNITY): Payer: Self-pay | Admitting: *Deleted

## 2014-12-16 NOTE — ED Notes (Addendum)
Kathleen Mcfarland at [facility] notified that the pt was being transported via [place] that a copy from [facility] is being sent with the pt requesting additional updates on pts care, a copy of the letter was sent with the pt & the pts paperwork with [facility] to be handed off to [facility], pt updated

## 2014-12-16 NOTE — ED Notes (Addendum)
Security escorting pt to her vehicle so that she can get additional belongings for transport to [facility], pt aware of the policy of our pt belongings, Kathleen Mcfarland B. Charge RN aware pt voluntary, pt to leave vehicle keys with security for a friend to pick up, will update chart with friends name & type of car prior to pt being transported to [facility]

## 2014-12-16 NOTE — ED Notes (Signed)
Pts vehicle a Special educational needs teacher [id number], the pts friend Delene Ruffini will pick the pts car keys up from security

## 2014-12-16 NOTE — ED Notes (Signed)
Representative from [place] called to verify pt's medical hx

## 2014-12-16 NOTE — Progress Notes (Signed)
Patient requested to speak with LCSW regarding court dates and letters. Patient discouraged and frustrated with her lawyer and behaviors of not helping her, but rather questioning her reasoning for seeking help.  Patient reports she wants a letter faxed for court appointed lawyer informing them of her admission to [facility]. LCSW drafted letter, allowed patient to read and approve in which she did with sitter as witness.    Patient requested a copy and original faxed. All documents placed in a folder for patient when she transport so she has copies.  No other needs at this time. Patient is able to DC to [facility]. [facility] will transport once report has been called. Patient to complete her paperwork once she arrives at [facility].  Lane Hacker, MSW Clinical Social Work: Emergency Room [phone]

## 2014-12-16 NOTE — ED Notes (Signed)
Kathleen Mcfarland, Education officer, museum at bedside speaking with pt

## 2014-12-16 NOTE — ED Notes (Signed)
[facility] Pharmacologist notified for transport of pt to [facility]

## 2014-12-25 ENCOUNTER — Ambulatory Visit: Payer: Self-pay | Admitting: Obstetrics & Gynecology

## 2015-01-28 ENCOUNTER — Ambulatory Visit: Payer: Self-pay | Admitting: Medical

## 2015-02-19 ENCOUNTER — Encounter: Payer: Self-pay | Admitting: Gastroenterology

## 2015-02-25 ENCOUNTER — Other Ambulatory Visit: Payer: Self-pay

## 2015-02-25 DIAGNOSIS — I10 Essential (primary) hypertension: Secondary | ICD-10-CM

## 2015-02-25 MED ORDER — TRIAMTERENE-HCTZ 37.5-25 MG PO TABS: ORAL_TABLET | ORAL | Status: DC

## 2015-03-02 ENCOUNTER — Ambulatory Visit: Payer: Medicare PPO | Attending: Family Medicine | Admitting: Pharmacist

## 2015-03-02 DIAGNOSIS — Z23 Encounter for immunization: Secondary | ICD-10-CM | POA: Diagnosis not present

## 2015-03-02 MED ORDER — INFLUENZA VAC SPLIT QUAD 0.5 ML IM SUSY
0.5000 mL | PREFILLED_SYRINGE | Freq: Once | INTRAMUSCULAR | Status: AC
  Administered 2015-03-02: 0.5 mL via INTRAMUSCULAR

## 2015-03-02 NOTE — Patient Instructions (Signed)

## 2015-03-04 ENCOUNTER — Ambulatory Visit: Payer: Medicare PPO | Attending: Family Medicine | Admitting: Family Medicine

## 2015-03-04 ENCOUNTER — Encounter: Payer: Self-pay | Admitting: Family Medicine

## 2015-03-04 VITALS — BP 114/75 | HR 89 | Temp 98.2°F | Resp 16 | Ht 67.0 in | Wt 236.0 lb

## 2015-03-04 DIAGNOSIS — M179 Osteoarthritis of knee, unspecified: Secondary | ICD-10-CM

## 2015-03-04 DIAGNOSIS — E890 Postprocedural hypothyroidism: Secondary | ICD-10-CM | POA: Insufficient documentation

## 2015-03-04 DIAGNOSIS — E039 Hypothyroidism, unspecified: Secondary | ICD-10-CM

## 2015-03-04 DIAGNOSIS — Z6836 Body mass index (BMI) 36.0-36.9, adult: Secondary | ICD-10-CM | POA: Diagnosis not present

## 2015-03-04 DIAGNOSIS — E669 Obesity, unspecified: Secondary | ICD-10-CM

## 2015-03-04 DIAGNOSIS — Z79899 Other long term (current) drug therapy: Secondary | ICD-10-CM | POA: Insufficient documentation

## 2015-03-04 DIAGNOSIS — Z0000 Encounter for general adult medical examination without abnormal findings: Secondary | ICD-10-CM | POA: Diagnosis not present

## 2015-03-04 DIAGNOSIS — M62830 Muscle spasm of back: Secondary | ICD-10-CM

## 2015-03-04 DIAGNOSIS — IMO0002 Reserved for concepts with insufficient information to code with codable children: Secondary | ICD-10-CM

## 2015-03-04 DIAGNOSIS — M1710 Unilateral primary osteoarthritis, unspecified knee: Secondary | ICD-10-CM

## 2015-03-04 DIAGNOSIS — N924 Excessive bleeding in the premenopausal period: Secondary | ICD-10-CM | POA: Diagnosis not present

## 2015-03-04 LAB — TSH: TSH: 0.23 u[IU]/mL — ABNORMAL LOW (ref 0.350–4.500)

## 2015-03-04 MED ORDER — IBUPROFEN 800 MG PO TABS: 800.0000 mg | ORAL_TABLET | Freq: Three times a day (TID) | ORAL | Status: DC | PRN

## 2015-03-04 NOTE — Progress Notes (Signed)
Requesting wt management referral Medicine refills  No pain today  No hx tobacco    Wt management- [facility] care [facility] [phone] [address]  [place] [address]

## 2015-03-04 NOTE — Progress Notes (Signed)
Patient ID: Kathleen Mcfarland, female   DOB: 1960/10/15, 54 y.o.   MRN: [id number]   Subjective:  Patient ID: Kathleen Mcfarland, female    DOB: [birth year]  Age: 54 y.o. MRN: [id number]  CC: Medication Refill and Referral   HPI Kathleen Mcfarland presents for   1. Refill ibuprofen. She takes this for chronic knee and back pain. No GERD. No hx of renal insufficiency.   2.  Obesity: requesting referral to a specific medical nutritionist. Having trouble losing weight. Has post-thyroidectomy hypothyroidism. Taking synthroid.   3. Irregular menses: menstrual changes x 3 months. Periods or either short or long. Last pap normal. MGM with ovarian cancer. No other family members with ovarian or uterine cancer. She is worried. She would like another pap sooner than the recommended 01/2017.   4. Colonoscopy: request referral. She is due as the recommended interval was 5 years.    Social History  Substance Use Topics  . Smoking status: Never Smoker   . Smokeless tobacco: Never Used  . Alcohol Use: No    Outpatient Prescriptions Prior to Visit  Medication Sig Dispense Refill  . ARIPiprazole (ABILIFY) 15 MG tablet Take 0.5 tablets (7.5 mg total) by mouth daily. 30 tablet 0  . baclofen (LIORESAL) 10 MG tablet Take 10 mg by mouth 3 (three) times daily as needed for muscle spasms.    . cetirizine (ZYRTEC) 10 MG tablet Take 1 tablet (10 mg total) by mouth daily. 30 tablet 3  . cholecalciferol (VITAMIN D) 1000 UNITS tablet Take 1 tablet (1,000 Units total) by mouth daily. For bone health    . fluticasone (FLONASE) 50 MCG/ACT nasal spray Place 2 sprays into both nostrils daily. 16 g 3  . levothyroxine (SYNTHROID, LEVOTHROID) 200 MCG tablet Take 1 tablet (200 mcg total) by mouth daily before breakfast. For low thyroid function 30 tablet 2  . triamterene-hydrochlorothiazide (MAXZIDE-25) 37.5-25 MG tablet TAKE 1 TABLET BY MOUTH DAILY. FOR HIGH BLOOD PRESSURE 30 tablet 3  . ibuprofen (ADVIL,MOTRIN) 600 MG  tablet Take 1 tablet (600 mg total) by mouth every 8 (eight) hours as needed. (Patient not taking: Reported on 03/04/2015) 30 tablet 1   No facility-administered medications prior to visit.    ROS Review of Systems  Constitutional: Negative for fever and chills.  Eyes: Negative for visual disturbance.  Respiratory: Negative for shortness of breath.   Cardiovascular: Negative for chest pain.  Gastrointestinal: Negative for abdominal pain and blood in stool.  Musculoskeletal: Negative for back pain and arthralgias.  Skin: Negative for rash.  Allergic/Immunologic: Negative for immunocompromised state.  Hematological: Negative for adenopathy. Does not bruise/bleed easily.  Psychiatric/Behavioral: Negative for suicidal ideas and dysphoric mood.    Objective:  BP 114/75 mmHg  Pulse 89  Temp(Src) 98.2 F (36.8 C) (Oral)  Resp 16  Ht 5\' 7"  (1.702 m)  Wt 236 lb (107.049 kg)  BMI 36.95 kg/m2  SpO2 99%  LMP 02/24/2015  BP/Weight 03/04/2015 12/16/2014 9/62/9528  Systolic BP 413 244 -  Diastolic BP 75 79 -  Wt. (Lbs) 236 - 225  BMI 36.95 - 35.23  Some encounter information is confidential and restricted. Go to Review Flowsheets activity to see all data.    Physical Exam  Constitutional: She is oriented to person, place, and time. She appears well-developed and well-nourished. No distress.  HENT:  Head: Normocephalic and atraumatic.  Cardiovascular: Normal rate, regular rhythm, normal heart sounds and intact distal pulses.   Pulmonary/Chest: Effort normal and breath sounds normal.  Musculoskeletal: She exhibits no edema.  Neurological: She is alert and oriented to person, place, and time.  Skin: Skin is warm and dry. No rash noted.  Psychiatric: She has a normal mood and affect.    Assessment & Plan:   Problem List Items Addressed This Visit    Abnormal perimenopausal bleeding   Relevant Orders   US Pelvis Complete   US Transvaginal Non-OB   Back muscle spasm (Chronic)    Relevant Medications   ibuprofen (ADVIL,MOTRIN) 800 MG tablet   Hypothyroidism - Primary (Chronic)   Relevant Orders   TSH   Osteoarthrosis involving lower leg (Chronic)   Relevant Medications   ibuprofen (ADVIL,MOTRIN) 800 MG tablet    Other Visit Diagnoses    Healthcare maintenance        Relevant Orders    Ambulatory referral to Gastroenterology    Obesity (BMI 30.0-34.9)        Relevant Orders    Amb ref to Medical Nutrition Therapy-MNT       No orders of the defined types were placed in this encounter.    Follow-up: No Follow-up on file.   Boykin Nearing MD

## 2015-03-04 NOTE — Patient Instructions (Addendum)
  Yer was seen today for medication refill and referral.  Diagnoses and all orders for this visit:  Hypothyroidism, unspecified hypothyroidism type -     TSH  Healthcare maintenance -     Ambulatory referral to Gastroenterology  Back muscle spasm -     ibuprofen (ADVIL,MOTRIN) 800 MG tablet; Take 1 tablet (800 mg total) by mouth every 8 (eight) hours as needed.  Osteoarthrosis involving lower leg -     ibuprofen (ADVIL,MOTRIN) 800 MG tablet; Take 1 tablet (800 mg total) by mouth every 8 (eight) hours as needed.  Obesity (BMI 30.0-34.9) -     Amb ref to Medical Nutrition Therapy-MNT  Abnormal perimenopausal bleeding -     US Pelvis Complete; Future -     US Transvaginal Non-OB; Future    Check out a ketogenic diet You will be called with lab and ultrasound results   F/u in 6-8 weeks for pelvic exam   Dr. Adrian Blackwater

## 2015-03-05 MED ORDER — LEVOTHYROXINE SODIUM 200 MCG PO TABS: 200.0000 ug | ORAL_TABLET | Freq: Every day | ORAL | Status: DC

## 2015-03-05 NOTE — Addendum Note (Signed)
Addended by: Boykin Nearing on: 03/05/2015 08:31 AM   Modules accepted: Orders

## 2015-03-12 ENCOUNTER — Telehealth: Payer: Self-pay | Admitting: *Deleted

## 2015-03-12 NOTE — Telephone Encounter (Signed)
-----   Message from Boykin Nearing, MD sent at 03/05/2015  8:29 AM EDT ----- Repeat TSH is slightly low, recommend continuing current synthroid dose as patient is asymptomatic w/o evidence of hyperthyroidism

## 2015-03-15 ENCOUNTER — Ambulatory Visit (HOSPITAL_COMMUNITY): Payer: Medicare PPO

## 2015-04-20 ENCOUNTER — Other Ambulatory Visit: Payer: Self-pay

## 2015-04-20 DIAGNOSIS — Z1231 Encounter for screening mammogram for malignant neoplasm of breast: Secondary | ICD-10-CM

## 2015-05-17 ENCOUNTER — Ambulatory Visit
Admission: RE | Admit: 2015-05-17 | Discharge: 2015-05-17 | Disposition: A | Discharge status: Home / Self Care | Payer: Medicare PPO | Source: Ambulatory Visit

## 2015-05-17 DIAGNOSIS — Z1231 Encounter for screening mammogram for malignant neoplasm of breast: Secondary | ICD-10-CM

## 2015-05-19 DIAGNOSIS — E669 Obesity, unspecified: Secondary | ICD-10-CM | POA: Insufficient documentation

## 2015-05-19 DIAGNOSIS — M67911 Unspecified disorder of synovium and tendon, right shoulder: Secondary | ICD-10-CM | POA: Insufficient documentation

## 2015-05-19 DIAGNOSIS — M67912 Unspecified disorder of synovium and tendon, left shoulder: Secondary | ICD-10-CM | POA: Insufficient documentation

## 2015-05-27 ENCOUNTER — Ambulatory Visit: Payer: Self-pay | Admitting: Obstetrics and Gynecology

## 2015-06-22 ENCOUNTER — Other Ambulatory Visit: Payer: Self-pay | Admitting: Orthopedic Surgery

## 2015-06-22 DIAGNOSIS — M25562 Pain in left knee: Secondary | ICD-10-CM

## 2015-07-02 ENCOUNTER — Other Ambulatory Visit: Payer: Self-pay

## 2015-07-20 ENCOUNTER — Telehealth: Payer: Self-pay | Admitting: Gastroenterology

## 2015-07-20 NOTE — Telephone Encounter (Signed)
Received referral to schedule colonoscopy for patient. Recall colon 03/2020  I called patient. She states that her father was diagnosed with colon cancer last year. Patient states that she does have hemorrhoids and has been having constipation. She refused to schedule an OV stating that she is on a "fixed income". She is wanting to schedule colonoscopy now. Please advise as to scheduling.

## 2015-07-20 NOTE — Telephone Encounter (Signed)
Informed patient of this and she states that she will call back to schedule °

## 2015-07-20 NOTE — Telephone Encounter (Signed)
She will have to come in for an appt, he will not do a direct colon without first talking with her in the office.  She is not due until 03/2020, if she is having symptoms or changes in medical or family history this needs to be discussed in the office.  Thanks

## 2015-07-28 ENCOUNTER — Other Ambulatory Visit: Payer: Self-pay

## 2015-08-19 ENCOUNTER — Ambulatory Visit (INDEPENDENT_AMBULATORY_CARE_PROVIDER_SITE_OTHER): Payer: Medicare PPO | Admitting: Obstetrics & Gynecology

## 2015-08-19 ENCOUNTER — Encounter: Payer: Self-pay | Admitting: Obstetrics & Gynecology

## 2015-08-19 ENCOUNTER — Other Ambulatory Visit (HOSPITAL_COMMUNITY)
Admission: RE | Admit: 2015-08-19 | Discharge: 2015-08-19 | Disposition: A | Discharge status: Home / Self Care | Payer: Medicare PPO | Source: Ambulatory Visit | Attending: Obstetrics & Gynecology | Admitting: Obstetrics & Gynecology

## 2015-08-19 VITALS — BP 99/82 | HR 102 | Ht 67.0 in | Wt 235.1 lb

## 2015-08-19 DIAGNOSIS — N951 Menopausal and female climacteric states: Secondary | ICD-10-CM

## 2015-08-19 DIAGNOSIS — Z124 Encounter for screening for malignant neoplasm of cervix: Secondary | ICD-10-CM | POA: Diagnosis not present

## 2015-08-19 DIAGNOSIS — Z1151 Encounter for screening for human papillomavirus (HPV): Secondary | ICD-10-CM | POA: Insufficient documentation

## 2015-08-19 DIAGNOSIS — Z01419 Encounter for gynecological examination (general) (routine) without abnormal findings: Secondary | ICD-10-CM | POA: Insufficient documentation

## 2015-08-19 NOTE — Patient Instructions (Signed)

## 2015-08-19 NOTE — Progress Notes (Signed)
Patient ID: Kathleen Mcfarland, female   DOB: Mar 23, 1961, 55 y.o.   MRN: [id number]  Chief Complaint  Patient presents with  . Gynecologic Exam  requests pap  HPI Kathleen Mcfarland is a 55 y.o. female.  G2P2  Patient's last menstrual period was 07/31/2015 (exact date). Has perimenopausal VMS and menses are irregular. Last pap 01/2014 normal  HPI  Past Medical History  Diagnosis Date  . Allergic rhinitis   . Hypothyroidism   . Hypertension   . Osteoarthritis of knee   . Osteoarthritis 2012    bilateral knees  . Complication of anesthesia     hard to wake up  . Back pain   . Depression   . Anxiety     Past Surgical History  Procedure Laterality Date  . Tubal ligation    . Rotator cuff repair      Right  . Dilation and curettage of uterus    . Shoulder arthroscopy  04/13/2011    Procedure: ARTHROSCOPY SHOULDER;  Surgeon: Ninetta Lights, MD;  Location: [facility];  Service: Orthopedics;  Laterality: Right;  Right Shoulder Arthroscopy with Acrmioploasty, Arhtroscopic rotator cuff repair and Microfracture humeral head  . Shoulder arthroscopy with rotator cuff repair Right 07/04/2012    Procedure: SHOULDER ARTHROSCOPY WITH ROTATOR CUFF REPAIR;  Surgeon: Ninetta Lights, MD;  Location: [facility];  Service: Orthopedics;  Laterality: Right;  RIGHT SHOULDER ARTHROSCOPY WITH ARTHROSCOPIC ROTATOR CUFF REPAIR, LYSIS OF ADHESIONS  . Colonscopy       Family History  Problem Relation Age of Onset  . Breast cancer Neg Hx   . Stroke Neg Hx   . Coronary artery disease Father 62    heart disease diagnoised  . Diabetes Father   . Hypertension Father   . Heart disease Father   . Colon cancer      great uncle at 59, great aunt dx at 30, GM age 25  . Colon polyps      2 sisters dx at age 32 and 33  . Diabetes Mother   . Hypertension Mother   . Arthritis Sister   . Cancer Maternal Grandmother     ovarian   . Cancer Maternal Grandfather   . Heart disease  Paternal Grandmother   . Cervical cancer Sister     Social History Social History  Substance Use Topics  . Smoking status: Never Smoker   . Smokeless tobacco: Never Used  . Alcohol Use: No    Allergies  Allergen Reactions  . Felodipine Nausea And Vomiting    Current Outpatient Prescriptions  Medication Sig Dispense Refill  . baclofen (LIORESAL) 10 MG tablet Take 10 mg by mouth 3 (three) times daily as needed for muscle spasms.    . cetirizine (ZYRTEC) 10 MG tablet Take 1 tablet (10 mg total) by mouth daily. 30 tablet 3  . cholecalciferol (VITAMIN D) 1000 UNITS tablet Take 1 tablet (1,000 Units total) by mouth daily. For bone health    . ibuprofen (ADVIL,MOTRIN) 800 MG tablet Take 1 tablet (800 mg total) by mouth every 8 (eight) hours as needed. 60 tablet 3  . levothyroxine (SYNTHROID, LEVOTHROID) 200 MCG tablet Take 1 tablet (200 mcg total) by mouth daily before breakfast. For low thyroid function 30 tablet 2  . triamterene-hydrochlorothiazide (MAXZIDE-25) 37.5-25 MG tablet TAKE 1 TABLET BY MOUTH DAILY. FOR HIGH BLOOD PRESSURE 30 tablet 3  . fluticasone (FLONASE) 50 MCG/ACT nasal spray Place 2 sprays into both nostrils daily. (  Patient not taking: Reported on 08/19/2015) 16 g 3   No current facility-administered medications for this visit.    Review of Systems Review of Systems  Constitutional: Negative.  Negative for unexpected weight change.  Respiratory: Negative.   Gastrointestinal: Positive for constipation. Negative for anal bleeding.  Endocrine:       VMS  Genitourinary: Negative for vaginal bleeding, vaginal discharge and menstrual problem.    Blood pressure 99/82, pulse 102, height 5\' 7"  (1.702 m), weight 235 lb 1.6 oz (106.641 kg), last menstrual period 07/31/2015.  Physical Exam Physical Exam  Constitutional: She is oriented to person, place, and time. She appears well-developed. No distress.  obese  Cardiovascular: Normal rate.   Pulmonary/Chest: Effort  normal. No respiratory distress.  Breasts: breasts appear normal, no suspicious masses, no skin or nipple changes or axillary nodes.   Abdominal: Soft. She exhibits no distension and no mass. There is no tenderness.  Genitourinary: Vagina normal and uterus normal. No vaginal discharge found.  No mass, pap sent  Neurological: She is alert and oriented to person, place, and time.  Skin: Skin is warm and dry.  Psychiatric: She has a normal mood and affect. Her behavior is normal.  Vitals reviewed.   Data Reviewed Pap results  Assessment    Well woman exam Normal exam Pap screening done, repeat in 3-5 year if nl   Obesity  Plan    Continue routine f/u Colonoscopy recommended  Mammography annually       ARNOLD,JAMES 08/19/2015, 2:10 PM

## 2015-08-20 LAB — CYTOLOGY - PAP

## 2015-09-02 ENCOUNTER — Encounter: Payer: Self-pay | Admitting: Gastroenterology

## 2015-09-21 ENCOUNTER — Other Ambulatory Visit: Payer: Self-pay | Admitting: Family

## 2015-09-21 DIAGNOSIS — R590 Localized enlarged lymph nodes: Secondary | ICD-10-CM

## 2015-09-24 ENCOUNTER — Other Ambulatory Visit: Payer: Self-pay

## 2015-09-29 ENCOUNTER — Ambulatory Visit
Admission: RE | Admit: 2015-09-29 | Discharge: 2015-09-29 | Disposition: A | Discharge status: Home / Self Care | Payer: Medicare PPO | Source: Ambulatory Visit | Attending: Family | Admitting: Family

## 2015-09-29 DIAGNOSIS — R590 Localized enlarged lymph nodes: Secondary | ICD-10-CM

## 2015-11-01 ENCOUNTER — Ambulatory Visit: Payer: Medicare PPO | Admitting: Gastroenterology

## 2015-11-18 ENCOUNTER — Other Ambulatory Visit: Payer: Self-pay

## 2016-03-05 ENCOUNTER — Other Ambulatory Visit (INDEPENDENT_AMBULATORY_CARE_PROVIDER_SITE_OTHER): Payer: Self-pay | Admitting: Orthopedic Surgery

## 2016-03-06 NOTE — Telephone Encounter (Signed)
Ok to refill 

## 2016-04-03 ENCOUNTER — Other Ambulatory Visit (INDEPENDENT_AMBULATORY_CARE_PROVIDER_SITE_OTHER): Payer: Self-pay | Admitting: Orthopedic Surgery

## 2016-04-14 ENCOUNTER — Telehealth (INDEPENDENT_AMBULATORY_CARE_PROVIDER_SITE_OTHER): Payer: Self-pay | Admitting: Orthopedic Surgery

## 2016-04-14 NOTE — Telephone Encounter (Signed)
Ok to rf pls clal htx

## 2016-04-14 NOTE — Telephone Encounter (Signed)
Pt requesting medication for pain, she said she cannot wait until Jan to see Dr. Marlou Sa. Wants to know if she can get a refill without being seen. Pt has osteoarthritis and is getting mri on the 28th.  Pt number is [phone]

## 2016-04-14 NOTE — Telephone Encounter (Signed)
Patient is asking for a refill of norco, please advise- MRI pending on 04/27/16 and will review after that.

## 2016-04-17 MED ORDER — HYDROCODONE-ACETAMINOPHEN 5-325 MG PO TABS: ORAL_TABLET | ORAL | 0 refills | Status: DC

## 2016-04-17 NOTE — Telephone Encounter (Signed)
Rx printed, will have Dr Marlou Sa sign and call pt once ready to pickup

## 2016-04-17 NOTE — Telephone Encounter (Signed)
IC and pt advised that Rx ready for pickup

## 2016-04-18 ENCOUNTER — Encounter: Payer: Self-pay | Admitting: Gastroenterology

## 2016-04-27 ENCOUNTER — Ambulatory Visit
Admission: RE | Admit: 2016-04-27 | Discharge: 2016-04-27 | Disposition: A | Discharge status: Home / Self Care | Payer: Medicare PPO | Source: Ambulatory Visit | Attending: Orthopedic Surgery | Admitting: Orthopedic Surgery

## 2016-04-27 DIAGNOSIS — M25562 Pain in left knee: Secondary | ICD-10-CM

## 2016-05-02 ENCOUNTER — Other Ambulatory Visit: Payer: Self-pay | Admitting: Family

## 2016-05-02 DIAGNOSIS — Z1231 Encounter for screening mammogram for malignant neoplasm of breast: Secondary | ICD-10-CM

## 2016-05-08 ENCOUNTER — Ambulatory Visit (INDEPENDENT_AMBULATORY_CARE_PROVIDER_SITE_OTHER): Payer: Self-pay | Admitting: Orthopedic Surgery

## 2016-05-11 DIAGNOSIS — H2513 Age-related nuclear cataract, bilateral: Secondary | ICD-10-CM | POA: Diagnosis not present

## 2016-05-11 DIAGNOSIS — H40033 Anatomical narrow angle, bilateral: Secondary | ICD-10-CM | POA: Diagnosis not present

## 2016-05-19 DIAGNOSIS — H578 Other specified disorders of eye and adnexa: Secondary | ICD-10-CM | POA: Diagnosis not present

## 2016-05-24 ENCOUNTER — Ambulatory Visit (INDEPENDENT_AMBULATORY_CARE_PROVIDER_SITE_OTHER): Payer: Self-pay | Admitting: Orthopedic Surgery

## 2016-05-25 ENCOUNTER — Ambulatory Visit (INDEPENDENT_AMBULATORY_CARE_PROVIDER_SITE_OTHER): Payer: PPO | Admitting: Orthopedic Surgery

## 2016-05-25 ENCOUNTER — Ambulatory Visit: Payer: Self-pay

## 2016-05-25 ENCOUNTER — Encounter (INDEPENDENT_AMBULATORY_CARE_PROVIDER_SITE_OTHER): Payer: Self-pay | Admitting: Orthopedic Surgery

## 2016-05-25 DIAGNOSIS — M25562 Pain in left knee: Secondary | ICD-10-CM

## 2016-05-25 NOTE — Progress Notes (Signed)
Office Visit Note   Patient: Kathleen Mcfarland           Date of Birth: 05/29/1960           MRN: [id number] Visit Date: 05/25/2016 Requested by: Boykin Nearing, MD [address] [place], [place] [address] PCP: Minerva Ends, MD  Subjective: Chief Complaint  Patient presents with  . Left Knee - Pain    HPI [name] is a 56 year old patient with left knee pain.  I last saw her a year ago.  MRI scan has been obtained since that time which shows patellofemoral arthritis along with medial compartment arthritis.  No clearly definable arthroscopically treatable lesions in the left knee.  Significant degenerative changes are present.  She's taking Norco as needed.  She also has shoulder and back issues which may require surgery.  Muscle relaxers help.  She is using a cane occasionally.              Review of Systems All systems reviewed are negative as they relate to the chief complaint within the history of present illness.  Patient denies  fevers or chills.   Assessment & Plan: Visit Diagnoses:  1. Left knee pain, unspecified chronicity     Plan: Impression is left knee pain which may require knee replacement in the future.  For now I encouraged weight loss quad strengthening exercises in a nonweightbearing way and is occasional injections.  She has other issues with shoulder and back which will be addressed.  I will see her back as needed decision point today was for or against arthroscopic intervention.  MRI scan shows against.  Follow-Up Instructions: No Follow-up on file.   Orders:  No orders of the defined types were placed in this encounter.  No orders of the defined types were placed in this encounter.     Procedures: No procedures performed   Clinical Data: No additional findings.  Objective: Vital Signs: There were no vitals taken for this visit.  Physical Exam   Constitutional: Patient appears well-developed HEENT:  Head: Normocephalic Eyes:EOM are  normal Neck: Normal range of motion Cardiovascular: Normal rate Pulmonary/chest: Effort normal Neurologic: Patient is alert Skin: Skin is warm Psychiatric: Patient has normal mood and affect    Ortho Exam examination of the left knee demonstrates no effusion about a 5 flexion contracture on the left with 0 on the right.  Pedal pulses palpable.  Collateral cruciate stable.  Patellofemoral crepitus is present on the left.  No groin pain with internal/external rotation leg.  No other masses lymph and other skin changes in the left knee region  Specialty Comments:  No specialty comments available.  Imaging: No results found.   PMFS History: Patient Active Problem List   Diagnosis Date Noted  . Abnormal perimenopausal bleeding 03/04/2015  . Suicidal ideation   . Back muscle spasm 01/30/2014  . Severe recurrent major depressive disorder with psychotic features ([place]) 01/03/2014  . Depression 01/02/2014  . Urinary incontinence 07/21/2010  . MENSTRUAL DISORDER 01/12/2009  . HYPERTENSION, BENIGN ESSENTIAL 07/31/2006  . Hypothyroidism (acquired) 06/23/2006  . ALLERGIC RHINITIS 06/23/2006  . Osteoarthrosis involving lower leg 06/23/2006   Past Medical History:  Diagnosis Date  . Allergic rhinitis   . Anxiety   . Back pain   . Complication of anesthesia    hard to wake up  . Depression   . Hypertension   . Hypothyroidism   . Osteoarthritis 2012   bilateral knees  . Osteoarthritis of knee  Family History  Problem Relation Age of Onset  . Breast cancer Neg Hx   . Stroke Neg Hx   . Coronary artery disease Father 60    heart disease diagnoised  . Diabetes Father   . Hypertension Father   . Heart disease Father   . Colon cancer      great uncle at 46, great aunt dx at 58, GM age 69  . Colon polyps      2 sisters dx at age 58 and 14  . Diabetes Mother   . Hypertension Mother   . Arthritis Sister   . Cancer Maternal Grandmother     ovarian   . Cancer Maternal  Grandfather   . Heart disease Paternal Grandmother   . Cervical cancer Sister     Past Surgical History:  Procedure Laterality Date  . colonscopy     . DILATION AND CURETTAGE OF UTERUS    . ROTATOR CUFF REPAIR     Right  . SHOULDER ARTHROSCOPY  04/13/2011   Procedure: ARTHROSCOPY SHOULDER;  Surgeon: Ninetta Lights, MD;  Location: [facility];  Service: Orthopedics;  Laterality: Right;  Right Shoulder Arthroscopy with Acrmioploasty, Arhtroscopic rotator cuff repair and Microfracture humeral head  . SHOULDER ARTHROSCOPY WITH ROTATOR CUFF REPAIR Right 07/04/2012   Procedure: SHOULDER ARTHROSCOPY WITH ROTATOR CUFF REPAIR;  Surgeon: Ninetta Lights, MD;  Location: [facility];  Service: Orthopedics;  Laterality: Right;  RIGHT SHOULDER ARTHROSCOPY WITH ARTHROSCOPIC ROTATOR CUFF REPAIR, LYSIS OF ADHESIONS  . TUBAL LIGATION     Social History   Occupational History  . None    Social History Main Topics  . Smoking status: Never Smoker  . Smokeless tobacco: Never Used  . Alcohol use No  . Drug use: No  . Sexual activity: Yes    Birth control/ protection: Other-see comments     Comment: BTL

## 2016-06-13 ENCOUNTER — Ambulatory Visit: Payer: Self-pay | Admitting: Gastroenterology

## 2016-06-27 ENCOUNTER — Ambulatory Visit
Admission: RE | Admit: 2016-06-27 | Discharge: 2016-06-27 | Disposition: A | Discharge status: Home / Self Care | Payer: PPO | Source: Ambulatory Visit | Attending: Family | Admitting: Family

## 2016-06-27 DIAGNOSIS — Z1231 Encounter for screening mammogram for malignant neoplasm of breast: Secondary | ICD-10-CM

## 2016-06-29 ENCOUNTER — Ambulatory Visit (INDEPENDENT_AMBULATORY_CARE_PROVIDER_SITE_OTHER): Payer: PPO | Admitting: Orthopedic Surgery

## 2016-06-30 DIAGNOSIS — M25512 Pain in left shoulder: Secondary | ICD-10-CM | POA: Diagnosis not present

## 2016-07-03 ENCOUNTER — Ambulatory Visit (INDEPENDENT_AMBULATORY_CARE_PROVIDER_SITE_OTHER): Payer: PPO | Admitting: Orthopedic Surgery

## 2016-07-03 DIAGNOSIS — Z79891 Long term (current) use of opiate analgesic: Secondary | ICD-10-CM | POA: Diagnosis not present

## 2016-07-03 DIAGNOSIS — Z1211 Encounter for screening for malignant neoplasm of colon: Secondary | ICD-10-CM | POA: Diagnosis not present

## 2016-07-03 DIAGNOSIS — M67911 Unspecified disorder of synovium and tendon, right shoulder: Secondary | ICD-10-CM | POA: Diagnosis not present

## 2016-07-03 DIAGNOSIS — M67912 Unspecified disorder of synovium and tendon, left shoulder: Secondary | ICD-10-CM | POA: Diagnosis not present

## 2016-07-03 DIAGNOSIS — E039 Hypothyroidism, unspecified: Secondary | ICD-10-CM | POA: Diagnosis not present

## 2016-07-03 DIAGNOSIS — I10 Essential (primary) hypertension: Secondary | ICD-10-CM | POA: Diagnosis not present

## 2016-07-03 DIAGNOSIS — M170 Bilateral primary osteoarthritis of knee: Secondary | ICD-10-CM | POA: Diagnosis not present

## 2016-07-19 ENCOUNTER — Ambulatory Visit (INDEPENDENT_AMBULATORY_CARE_PROVIDER_SITE_OTHER): Payer: PPO | Admitting: Gastroenterology

## 2016-07-19 ENCOUNTER — Encounter: Payer: Self-pay | Admitting: Gastroenterology

## 2016-07-19 VITALS — BP 110/66 | HR 88 | Ht 67.0 in | Wt 240.2 lb

## 2016-07-19 DIAGNOSIS — K5900 Constipation, unspecified: Secondary | ICD-10-CM

## 2016-07-19 DIAGNOSIS — Z800 Family history of malignant neoplasm of digestive organs: Secondary | ICD-10-CM | POA: Diagnosis not present

## 2016-07-19 MED ORDER — NA SULFATE-K SULFATE-MG SULF 17.5-3.13-1.6 GM/177ML PO SOLN: 1.0000 | Freq: Once | ORAL | 0 refills | Status: AC

## 2016-07-19 NOTE — Progress Notes (Signed)
Review of pertinent gastrointestinal problems: 1. Routine risk for colon cancer, ? FH contribution?: Colonoscopy November 2011, Dr. Owens Mcfarland. Diverticulosis was noted. There were no polyps or cancers. She was originally recommended to have repeat colonoscopy at five-year interval given second degree relatives with colon cancer. Colon cancer screening guidelines changed in the interval and so she was recommended to have repeat colonoscopy in 2021.    HPI: This is a very pleasant 56 year old woman  who was referred to me by Kathleen Nearing, MD  to evaluate  family history colon cancer .    Chief complaint is family history colon cancer, also intermittent constipation  MGM had colon and uterine cancer Father had  Colon cancer (diagnosed at age 59).  He was just diagnosed 2 or 3 years ago Several family members with colon polyps (including brother and sister).  She thinks she has hemorrhoids.  She will have minor intermittent bleeding around the time of pushing and straining to move her bowels  She will have to push and strain often to move her bowels.    Has never tried fiber supplements.  No significant weight changes. No significant abdominal pains. No fevers or chills. No changes in her bowels.   Review of systems: Pertinent positive and negative review of systems were noted in the above HPI section. Complete review of systems was performed and was otherwise normal.   Past Medical History:  Diagnosis Date  . Allergic rhinitis   . Anxiety   . Back pain   . Complication of anesthesia    hard to wake up  . Depression   . Hypertension   . Hypothyroidism   . Osteoarthritis 2012   bilateral knees  . Osteoarthritis of knee     Past Surgical History:  Procedure Laterality Date  . colonscopy     . DILATION AND CURETTAGE OF UTERUS    . ROTATOR CUFF REPAIR    . SHOULDER ARTHROSCOPY  04/13/2011   Procedure: ARTHROSCOPY SHOULDER;  Surgeon: Kathleen Lights, MD;  Location:  [facility];  Service: Orthopedics;  Laterality: Right;  Right Shoulder Arthroscopy with Acrmioploasty, Arhtroscopic rotator cuff repair and Microfracture humeral head  . SHOULDER ARTHROSCOPY WITH ROTATOR CUFF REPAIR Right 07/04/2012   Procedure: SHOULDER ARTHROSCOPY WITH ROTATOR CUFF REPAIR;  Surgeon: Kathleen Lights, MD;  Location: [facility];  Service: Orthopedics;  Laterality: Right;  RIGHT SHOULDER ARTHROSCOPY WITH ARTHROSCOPIC ROTATOR CUFF REPAIR, LYSIS OF ADHESIONS  . TUBAL LIGATION      Current Outpatient Prescriptions  Medication Sig Dispense Refill  . Ascorbic Acid (VITAMIN C) 1000 MG tablet Take 1,000 mg by mouth daily.    . baclofen (LIORESAL) 10 MG tablet TAKE ONE TABLET BY MOUTH TWICE DAILY AS NEEDED FOR SPASMS 30 tablet 5  . cholecalciferol (VITAMIN D) 1000 UNITS tablet Take 1 tablet (1,000 Units total) by mouth daily. For bone health    . DOCOSAHEXAENOIC ACID PO Take by mouth daily.    Marland Kitchen HYDROcodone-acetaminophen (NORCO/VICODIN) 5-325 MG tablet Take 1 tablet every 12-24 hr as needed for pain. 30 tablet 0  . ibuprofen (ADVIL,MOTRIN) 800 MG tablet Take 1 tablet (800 mg total) by mouth every 8 (eight) hours as needed. 60 tablet 3  . levothyroxine (SYNTHROID, LEVOTHROID) 200 MCG tablet Take 1 tablet (200 mcg total) by mouth daily before breakfast. For low thyroid function 30 tablet 2  . triamterene-hydrochlorothiazide (MAXZIDE-25) 37.5-25 MG tablet TAKE 1 TABLET BY MOUTH DAILY. FOR HIGH BLOOD PRESSURE 30 tablet 3  .  vitamin B-12 (CYANOCOBALAMIN) 500 MCG tablet Take 500 mcg by mouth daily.     No current facility-administered medications for this visit.     Allergies as of 07/19/2016 - Review Complete 07/19/2016  Allergen Reaction Noted  . Penicillins Hives 07/19/2016  . Felodipine Nausea And Vomiting 12/27/2006    Family History  Problem Relation Age of Onset  . Coronary artery disease Father 44    heart disease diagnoised  . Hypertension Father    . Heart disease Father   . Colon cancer Father   . Diabetes Mother   . Hypertension Mother   . Colon cancer      great uncle at 20, great aunt dx at 70, GM age 21  . Colon polyps      2 sisters dx at age 9 and 31  . Arthritis Sister   . Hypertension Sister   . Diabetes Sister   . Colon polyps Sister   . Colon cancer Maternal Grandmother   . Ovarian cancer Maternal Grandmother   . Cirrhosis Maternal Grandfather   . Heart disease Paternal Grandmother   . Cervical cancer Sister   . Other Sister     uterine fibroids  . Hypertension Sister   . Diabetes Sister   . Colon polyps Sister   . Colon polyps Brother   . Breast cancer Neg Hx   . Stroke Neg Hx     Social History   Social History  . Marital status: Single    Spouse name: N/A  . Number of children: 2  . Years of education: N/A   Occupational History  . None    Social History Main Topics  . Smoking status: Never Smoker  . Smokeless tobacco: Never Used  . Alcohol use Yes     Comment: glass of wine once a month  . Drug use: No  . Sexual activity: Yes    Birth control/ protection: Other-see comments     Comment: BTL   Other Topics Concern  . Not on file   Social History Narrative   Regular Exercise- yes 3/week   Caffeine- no     Physical Exam: Ht 5\' 7"  (1.702 m) Comment: height measured without shoes  Wt 240 lb 4 oz (109 kg)   BMI 37.63 kg/m  Constitutional: generally well-appearing Psychiatric: alert and oriented x3 Eyes: extraocular movements intact Mouth: oral pharynx moist, no lesions Neck: supple no lymphadenopathy Cardiovascular: heart regular rate and rhythm Lungs: clear to auscultation bilaterally Abdomen: soft, nontender, nondistended, no obvious ascites, no peritoneal signs, normal bowel sounds Extremities: no lower extremity edema bilaterally Skin: no lesions on visible extremities   Assessment and plan: 56 y.o. female with  Family history colon cancer, mild intermittent  constipation  Her family history has changed in the past few years since her father was diagnosed with colon cancer in his early 53s. She also has a second-degree relative with colon cancer. Cervical and ovarian cancers also run in her family. I recommended we proceed with repeat colon cancer screening with colonoscopy at her soonest convenience. I also recommended referral to genetic counselor given her significant cancer history in her family. Lastly for her mild intermittent constipation which I believe is probably functional, I recommended that she start fiber supplements on a daily basis. I see no reason for any further blood tests or imaging studies at this point.    Please see the "Patient Instructions" section for addition details about the plan.   Kathleen Loffler, MD [facility]  07/19/2016, 9:45 AM  Cc: Kathleen Nearing, MD

## 2016-07-19 NOTE — Patient Instructions (Addendum)
Referral to Genetic Councilor at [facility] given your family history of colon, cervical and ovarian cancers. You will be set up for a colonoscopy for FH of colon cancer (first and second degree relatives). Please start taking citrucel (orange flavored) powder fiber supplement.  This may cause some bloating at first but that usually goes away. Begin with a small spoonful and work your way up to a large, heaping spoonful daily over a week.

## 2016-08-04 ENCOUNTER — Ambulatory Visit: Payer: Self-pay | Admitting: Obstetrics & Gynecology

## 2016-08-07 ENCOUNTER — Ambulatory Visit: Payer: PPO | Admitting: Obstetrics & Gynecology

## 2016-08-07 DIAGNOSIS — Z01419 Encounter for gynecological examination (general) (routine) without abnormal findings: Secondary | ICD-10-CM

## 2016-08-07 NOTE — Progress Notes (Signed)
Patient was too early for insurance to pay for annual exam.  Nurse tried to contact patient via phone and received no answer and voicemail box not set up.  Patient arrived for appointment and was informed.  Patient not happy.  She opted to reschedule her appointment.

## 2016-08-09 ENCOUNTER — Telehealth (INDEPENDENT_AMBULATORY_CARE_PROVIDER_SITE_OTHER): Payer: Self-pay | Admitting: Orthopedic Surgery

## 2016-08-09 DIAGNOSIS — G894 Chronic pain syndrome: Secondary | ICD-10-CM

## 2016-08-09 NOTE — Telephone Encounter (Signed)
Please advise. Patient requesting pain meds. Last seen in January for knee arthritis

## 2016-08-09 NOTE — Telephone Encounter (Signed)
Patient called asking if she could be prescribed hydrocodone. CB # [phone]

## 2016-08-10 MED ORDER — HYDROCODONE-ACETAMINOPHEN 5-325 MG PO TABS: ORAL_TABLET | ORAL | 0 refills | Status: DC

## 2016-08-10 NOTE — Addendum Note (Signed)
Addended byLaurann Montana on: 08/10/2016 04:31 PM   Modules accepted: Orders

## 2016-08-10 NOTE — Telephone Encounter (Signed)
Okay for 1 more prescription but she needs more for her arthritis will likely need to get somewhere else as we do not typically do chronic long-term pain management for this particular problem

## 2016-08-10 NOTE — Telephone Encounter (Signed)
rx done Patient advised Will make referral

## 2016-08-10 NOTE — Telephone Encounter (Signed)
Referral made. Patient aware will be contacted

## 2016-08-10 NOTE — Addendum Note (Signed)
Addended byLaurann Montana on: 08/10/2016 04:55 PM   Modules accepted: Orders

## 2016-08-14 DIAGNOSIS — E039 Hypothyroidism, unspecified: Secondary | ICD-10-CM | POA: Diagnosis not present

## 2016-08-22 ENCOUNTER — Encounter: Payer: Self-pay | Admitting: Gastroenterology

## 2016-08-23 ENCOUNTER — Ambulatory Visit: Payer: Self-pay | Admitting: Obstetrics & Gynecology

## 2016-08-31 ENCOUNTER — Telehealth (INDEPENDENT_AMBULATORY_CARE_PROVIDER_SITE_OTHER): Payer: Self-pay

## 2016-08-31 NOTE — Telephone Encounter (Signed)
Patient would like a Rx refill on Baclofen.  CB# is [phone].

## 2016-08-31 NOTE — Telephone Encounter (Signed)
Ok to rf? 

## 2016-09-01 ENCOUNTER — Telehealth (INDEPENDENT_AMBULATORY_CARE_PROVIDER_SITE_OTHER): Payer: Self-pay | Admitting: Radiology

## 2016-09-01 NOTE — Telephone Encounter (Signed)
Patient called and asked about the refill of baclofen, I advised her pending per Dr Marlou Sa.  She says that the last time she asked for refill hydrocodone, she was told she needed a referral to pain management.  She says she does not take pain meds often, and does not want this referral, and asked me to cancel referral.  I will cancel pain management referral for her. She is aware it will be first of the week before we let her know about the baclofen refill.

## 2016-09-02 NOTE — Telephone Encounter (Signed)
Ok to rf pls cla lthx

## 2016-09-04 MED ORDER — BACLOFEN 10 MG PO TABS: ORAL_TABLET | ORAL | 0 refills | Status: DC

## 2016-09-04 NOTE — Addendum Note (Signed)
Addended byLaurann Montana on: 09/04/2016 08:24 AM   Modules accepted: Orders

## 2016-09-04 NOTE — Telephone Encounter (Signed)
rx submitted. Patient advised done.  

## 2016-09-05 ENCOUNTER — Encounter: Payer: Self-pay | Admitting: Gastroenterology

## 2016-09-05 ENCOUNTER — Ambulatory Visit (AMBULATORY_SURGERY_CENTER): Payer: PPO | Admitting: Gastroenterology

## 2016-09-05 VITALS — BP 138/80 | HR 73 | Temp 98.4°F | Resp 18 | Ht 67.0 in | Wt 240.0 lb

## 2016-09-05 DIAGNOSIS — Z1211 Encounter for screening for malignant neoplasm of colon: Secondary | ICD-10-CM | POA: Diagnosis not present

## 2016-09-05 DIAGNOSIS — D120 Benign neoplasm of cecum: Secondary | ICD-10-CM

## 2016-09-05 DIAGNOSIS — Z1212 Encounter for screening for malignant neoplasm of rectum: Secondary | ICD-10-CM

## 2016-09-05 DIAGNOSIS — K5730 Diverticulosis of large intestine without perforation or abscess without bleeding: Secondary | ICD-10-CM

## 2016-09-05 DIAGNOSIS — D122 Benign neoplasm of ascending colon: Secondary | ICD-10-CM | POA: Diagnosis not present

## 2016-09-05 DIAGNOSIS — Z800 Family history of malignant neoplasm of digestive organs: Secondary | ICD-10-CM

## 2016-09-05 HISTORY — PX: COLONOSCOPY: SHX174

## 2016-09-05 MED ORDER — SODIUM CHLORIDE 0.9 % IV SOLN: 500.0000 mL | INTRAVENOUS | Status: DC

## 2016-09-05 NOTE — Progress Notes (Signed)
Called to room to assist during endoscopic procedure.  Patient ID and intended procedure confirmed with present staff. Received instructions for my participation in the procedure from the performing physician.  

## 2016-09-05 NOTE — Op Note (Signed)
[facility] Patient Name: Kathleen Mcfarland Procedure Date: 09/05/2016 2:01 PM MRN: [id number] Endoscopist: Milus Banister , MD Age: 56 Referring MD:  Date of Birth: 04-11-1961 Gender: Female Account #: [id number] Procedure:                Colonoscopy Indications:              Family history of colon cancer in a first-degree                            relative (father): Colonoscopy November 2011, Dr.                            Owens Loffler. Diverticulosis was noted. There were                            no polyps or cancers. Medicines:                Monitored Anesthesia Care Procedure:                Pre-Anesthesia Assessment:                           - Prior to the procedure, a History and Physical                            was performed, and patient medications and                            allergies were reviewed. The patient's tolerance of                            previous anesthesia was also reviewed. The risks                            and benefits of the procedure and the sedation                            options and risks were discussed with the patient.                            All questions were answered, and informed consent                            was obtained. Prior Anticoagulants: The patient has                            taken no previous anticoagulant or antiplatelet                            agents. ASA Grade Assessment: II - A patient with                            mild systemic disease. After reviewing the risks  and benefits, the patient was deemed in                            satisfactory condition to undergo the procedure.                           After obtaining informed consent, the colonoscope                            was passed under direct vision. Throughout the                            procedure, the patient's blood pressure, pulse, and                            oxygen saturations were monitored  continuously. The                            Colonoscope was introduced through the anus and                            advanced to the the cecum, identified by                            appendiceal orifice and ileocecal valve. The                            colonoscopy was performed without difficulty. The                            patient tolerated the procedure well. The quality                            of the bowel preparation was excellent. The                            ileocecal valve, appendiceal orifice, and rectum                            were photographed. Scope In: 2:08:21 PM Scope Out: 2:18:08 PM Scope Withdrawal Time: 0 hours 7 minutes 32 seconds  Total Procedure Duration: 0 hours 9 minutes 47 seconds  Findings:                 A 1 mm polyp was found in the cecum. The polyp was                            sessile. The polyp was removed with a cold biopsy                            forceps. Resection and retrieval were complete.                           A 3 mm polyp was found in the ascending colon.  The                            polyp was sessile. The polyp was removed with a                            cold snare. Resection and retrieval were complete.                           Multiple small and large-mouthed diverticula were                            found in the entire colon.                           The exam was otherwise without abnormality on                            direct and retroflexion views. Complications:            No immediate complications. Estimated blood loss:                            None. Estimated Blood Loss:     Estimated blood loss: none. Impression:               - One 1 mm polyp in the cecum, removed with a cold                            biopsy forceps. Resected and retrieved.                           - One 3 mm polyp in the ascending colon, removed                            with a cold snare. Resected and retrieved.                            - Diverticulosis in the entire examined colon.                           - The examination was otherwise normal on direct                            and retroflexion views. Recommendation:           - Patient has a contact number available for                            emergencies. The signs and symptoms of potential                            delayed complications were discussed with the                            patient. Return to normal  activities tomorrow.                            Written discharge instructions were provided to the                            patient.                           - Resume previous diet.                           - Continue present medications.                           You will receive a letter within 2-3 weeks with the                            pathology results and my final recommendations.                           If the polyp(s) is proven to be 'pre-cancerous' on                            pathology, you will need repeat colonoscopy in 5                            years. Milus Banister, MD 09/05/2016 2:21:59 PM This report has been signed electronically.

## 2016-09-05 NOTE — Progress Notes (Signed)
Pt's states no medical or surgical changes since previsit or office visit. 

## 2016-09-05 NOTE — Progress Notes (Signed)
Spontaneous respirations throughout. VSS. Resting comfortably. To PACU on room air. Report to  Penny RN.  

## 2016-09-05 NOTE — Patient Instructions (Signed)
YOU HAD AN ENDOSCOPIC PROCEDURE TODAY AT THE [facility]:   Refer to the procedure report that was given to you for any specific questions about what was found during the examination.  If the procedure report does not answer your questions, please call your gastroenterologist to clarify.  If you requested that your care partner not be given the details of your procedure findings, then the procedure report has been included in a sealed envelope for you to review at your convenience later.  YOU SHOULD EXPECT: Some feelings of bloating in the abdomen. Passage of more gas than usual.  Walking can help get rid of the air that was put into your GI tract during the procedure and reduce the bloating. If you had a lower endoscopy (such as a colonoscopy or flexible sigmoidoscopy) you may notice spotting of blood in your stool or on the toilet paper. If you underwent a bowel prep for your procedure, you may not have a normal bowel movement for a few days.  Please Note:  You might notice some irritation and congestion in your nose or some drainage.  This is from the oxygen used during your procedure.  There is no need for concern and it should clear up in a day or so.  SYMPTOMS TO REPORT IMMEDIATELY:   Following lower endoscopy (colonoscopy or flexible sigmoidoscopy):  Excessive amounts of blood in the stool  Significant tenderness or worsening of abdominal pains  Swelling of the abdomen that is new, acute  Fever of 100F or higher    For urgent or emergent issues, a gastroenterologist can be reached at any hour by calling [phone].   DIET:  We do recommend a small meal at first, but then you may proceed to your regular diet.  Drink plenty of fluids but you should avoid alcoholic beverages for 24 hours.  ACTIVITY:  You should plan to take it easy for the rest of today and you should NOT DRIVE or use heavy machinery until tomorrow (because of the sedation medicines used during the test).     FOLLOW UP: Our staff will call the number listed on your records the next business day following your procedure to check on you and address any questions or concerns that you may have regarding the information given to you following your procedure. If we do not reach you, we will leave a message.  However, if you are feeling well and you are not experiencing any problems, there is no need to return our call.  We will assume that you have returned to your regular daily activities without incident.  If any biopsies were taken you will be contacted by phone or by letter within the next 1-3 weeks.  Please call us at [phone] if you have not heard about the biopsies in 3 weeks.    SIGNATURES/CONFIDENTIALITY: You and/or your care partner have signed paperwork which will be entered into your electronic medical record.  These signatures attest to the fact that that the information above on your After Visit Summary has been reviewed and is understood.  Full responsibility of the confidentiality of this discharge information lies with you and/or your care-partner.   Information on polyps given to you today 

## 2016-09-06 ENCOUNTER — Telehealth: Payer: Self-pay | Admitting: *Deleted

## 2016-09-06 NOTE — Telephone Encounter (Signed)
No answer. No identifier. Message left to call if questions or concerns and an attempt would be made to reach later in the day.

## 2016-09-07 ENCOUNTER — Ambulatory Visit: Payer: Self-pay | Admitting: Obstetrics & Gynecology

## 2016-09-07 ENCOUNTER — Telehealth: Payer: Self-pay | Admitting: *Deleted

## 2016-09-07 NOTE — Telephone Encounter (Signed)
  Follow up Call-  Call back number 09/05/2016  Post procedure Call Back phone  # [phone]  Permission to leave phone message Yes  Some recent data might be hidden     Patient questions:  Do you have a fever, pain , or abdominal swelling? No. Pain Score  0 *  Have you tolerated food without any problems? Yes.    Have you been able to return to your normal activities? Yes.    Do you have any questions about your discharge instructions: Diet   No. Medications  No. Follow up visit  No.  Do you have questions or concerns about your Care? No.  Actions: * If pain score is 4 or above: No action needed, pain <4.

## 2016-09-11 ENCOUNTER — Encounter: Payer: Self-pay | Admitting: Gastroenterology

## 2016-09-20 ENCOUNTER — Telehealth (INDEPENDENT_AMBULATORY_CARE_PROVIDER_SITE_OTHER): Payer: Self-pay

## 2016-09-20 NOTE — Telephone Encounter (Signed)
Tried calling patient. No answer.Tried to   for her to let her know placard application for handicap sticker could be picked up at front desk. No answer. No VM to LM

## 2016-09-29 DIAGNOSIS — E669 Obesity, unspecified: Secondary | ICD-10-CM | POA: Diagnosis not present

## 2016-09-29 DIAGNOSIS — R5383 Other fatigue: Secondary | ICD-10-CM | POA: Diagnosis not present

## 2016-09-29 DIAGNOSIS — Z713 Dietary counseling and surveillance: Secondary | ICD-10-CM | POA: Diagnosis not present

## 2016-10-12 ENCOUNTER — Other Ambulatory Visit (INDEPENDENT_AMBULATORY_CARE_PROVIDER_SITE_OTHER): Payer: Self-pay | Admitting: Orthopedic Surgery

## 2016-10-12 NOTE — Telephone Encounter (Signed)
Ok to rf? 

## 2016-10-18 ENCOUNTER — Ambulatory Visit: Payer: Self-pay | Admitting: Obstetrics & Gynecology

## 2016-11-09 ENCOUNTER — Telehealth (INDEPENDENT_AMBULATORY_CARE_PROVIDER_SITE_OTHER): Payer: Self-pay

## 2016-11-09 NOTE — Telephone Encounter (Signed)
y

## 2016-11-09 NOTE — Telephone Encounter (Signed)
Patient would like a 90 day supply refill on Baclofen if possible.   CB# [phone].  Please advise.  Thank You.

## 2016-11-09 NOTE — Telephone Encounter (Signed)
Please advise. Thanks.  

## 2016-11-10 MED ORDER — BACLOFEN 10 MG PO TABS: 5.0000 mg | ORAL_TABLET | Freq: Two times a day (BID) | ORAL | 0 refills | Status: DC

## 2016-11-10 NOTE — Addendum Note (Signed)
Addended byLaurann Montana on: 11/10/2016 10:03 AM   Modules accepted: Orders

## 2016-11-10 NOTE — Telephone Encounter (Signed)
Faxed. LM for patient advising.

## 2016-11-28 ENCOUNTER — Telehealth: Payer: Self-pay | Admitting: *Deleted

## 2016-11-28 NOTE — Telephone Encounter (Signed)
Call transferred from front desk patient asking to speak to nurse.  Kathleen Mcfarland states she has cancelled her appointment before because she was bleeding and has an appointment tomorrow for pap smear that she doesn't want to cancel. States she wants to get an ultrasound because she thought she was about finished with having periods but then had one  And is still spotting. I explained to her we can should still be able to do her pap smear tomorrow if she is only spotting and that the doctor would do an exam and then discuss with her her bleeding and concerns and then order tests as necessary which may include blood tests, ultrasound, etc.  Kathleen Mcfarland voices understanding.

## 2016-11-29 ENCOUNTER — Ambulatory Visit: Payer: Self-pay | Admitting: Obstetrics & Gynecology

## 2016-12-05 ENCOUNTER — Telehealth: Payer: Self-pay

## 2016-12-05 NOTE — Telephone Encounter (Signed)
Patient called the office stating that she has been on her cycle for 9 days and is cramping. Patient stated that she has an appointment on 8/29 but she wants to come in sooner. I explained to patient that I can not change her appointment but she can call the front office to see if we have cancellations. Patient verbalized understanding and had no questions.

## 2016-12-06 ENCOUNTER — Telehealth: Payer: Self-pay | Admitting: Obstetrics & Gynecology

## 2016-12-06 NOTE — Telephone Encounter (Signed)
Patient have some questions about her bleeding

## 2016-12-06 NOTE — Telephone Encounter (Signed)
I called Kathleen Mcfarland back and she reports she changed her appointment again because she was bleeding enough to wear a pad that am. States she thought she was in menopause but having bleeding at times and this time lasting 10 days, but not always heavy- is heavy at times. States she feels tired.  I explained to her not to cancel her appointment we can see her for bleeding and do pap at another time if pap is needed. I informed her I will send message to registrars to get her in sooner that 12/28/16 to see any provider but [facility] if possible per patient request.  I also instructed her to go to [facility] in the meantime if her bleeding is enough to fill pad in hour or less for 2 hours or more, or if feeling dizzy, lightheaded as may indicate anemia. She would like an ultrasound and I informed her will check with provider. She voices understanding.

## 2016-12-15 ENCOUNTER — Other Ambulatory Visit (HOSPITAL_COMMUNITY)
Admission: RE | Admit: 2016-12-15 | Discharge: 2016-12-15 | Disposition: A | Discharge status: Home / Self Care | Payer: PPO | Source: Ambulatory Visit | Attending: Obstetrics and Gynecology | Admitting: Obstetrics and Gynecology

## 2016-12-15 ENCOUNTER — Ambulatory Visit (INDEPENDENT_AMBULATORY_CARE_PROVIDER_SITE_OTHER): Payer: PPO | Admitting: Obstetrics and Gynecology

## 2016-12-15 VITALS — Ht 67.0 in | Wt 231.2 lb

## 2016-12-15 DIAGNOSIS — Z124 Encounter for screening for malignant neoplasm of cervix: Secondary | ICD-10-CM | POA: Diagnosis not present

## 2016-12-15 DIAGNOSIS — N938 Other specified abnormal uterine and vaginal bleeding: Secondary | ICD-10-CM

## 2016-12-15 DIAGNOSIS — Z1151 Encounter for screening for human papillomavirus (HPV): Secondary | ICD-10-CM | POA: Diagnosis not present

## 2016-12-15 DIAGNOSIS — N939 Abnormal uterine and vaginal bleeding, unspecified: Secondary | ICD-10-CM | POA: Diagnosis not present

## 2016-12-15 MED ORDER — MEGESTROL ACETATE 40 MG PO TABS: 40.0000 mg | ORAL_TABLET | Freq: Two times a day (BID) | ORAL | 5 refills | Status: DC

## 2016-12-15 NOTE — Progress Notes (Signed)
56 yo presenting today for the evaluation of DUB and annual exam. Patient reports a normal monthly period of 3-5 days prior until July where she bled for 15 days. She describes the bleeding as heavy with passage of clots often running down her legs. She also reports some vasomotor symptoms. This is the first abnormal episode of vaginal bleeding that she has experienced. Patient is not sexually active. She denies any urinary incontinence  Past Medical History:  Diagnosis Date  . Allergic rhinitis   . Anxiety   . Back pain   . Complication of anesthesia    hard to wake up  . Depression   . Hypertension   . Hypothyroidism   . Osteoarthritis 2012   bilateral knees  . Osteoarthritis of knee    Past Surgical History:  Procedure Laterality Date  . colonscopy     . DILATION AND CURETTAGE OF UTERUS    . ROTATOR CUFF REPAIR    . SHOULDER ARTHROSCOPY  04/13/2011   Procedure: ARTHROSCOPY SHOULDER;  Surgeon: Ninetta Lights, MD;  Location: [facility];  Service: Orthopedics;  Laterality: Right;  Right Shoulder Arthroscopy with Acrmioploasty, Arhtroscopic rotator cuff repair and Microfracture humeral head  . SHOULDER ARTHROSCOPY WITH ROTATOR CUFF REPAIR Right 07/04/2012   Procedure: SHOULDER ARTHROSCOPY WITH ROTATOR CUFF REPAIR;  Surgeon: Ninetta Lights, MD;  Location: [facility];  Service: Orthopedics;  Laterality: Right;  RIGHT SHOULDER ARTHROSCOPY WITH ARTHROSCOPIC ROTATOR CUFF REPAIR, LYSIS OF ADHESIONS  . TUBAL LIGATION     Family History  Problem Relation Age of Onset  . Coronary artery disease Father 10       heart disease diagnoised  . Hypertension Father   . Heart disease Father   . Colon cancer Father   . Diabetes Mother   . Hypertension Mother   . Colon cancer Unknown        great uncle at 98, great aunt dx at 11, GM age 46  . Colon polyps Unknown        2 sisters dx at age 37 and 62  . Arthritis Sister   . Hypertension Sister   . Diabetes Sister    . Colon polyps Sister   . Colon cancer Maternal Grandmother   . Ovarian cancer Maternal Grandmother   . Cirrhosis Maternal Grandfather   . Heart disease Paternal Grandmother   . Cervical cancer Sister   . Other Sister        uterine fibroids  . Hypertension Sister   . Diabetes Sister   . Colon polyps Sister   . Colon polyps Brother   . Breast cancer Neg Hx   . Stroke Neg Hx    Social History  Substance Use Topics  . Smoking status: Never Smoker  . Smokeless tobacco: Never Used  . Alcohol use Yes     Comment: glass of wine once a month   ROS See pertinent in HPI  GENERAL: Well-developed, well-nourished female in no acute distress.  HEENT: Normocephalic, atraumatic. Sclerae anicteric.  NECK: Supple. Normal thyroid.  LUNGS: Clear to auscultation bilaterally.  HEART: Regular rate and rhythm. BREASTS: Symmetric in size. No palpable masses or lymphadenopathy, skin changes, or nipple drainage. ABDOMEN: Soft, nontender, nondistended. No organomegaly. PELVIC: Normal external female genitalia. Vagina is pink and rugated.  Normal discharge. Normal appearing cervix. Uterus is normal in size. No adnexal mass or tenderness. EXTREMITIES: No cyanosis, clubbing, or edema, 2+ distal pulses.  A/P 56 yo with DUB - Discussed  the need for endometrial biopsy ENDOMETRIAL BIOPSY     The indications for endometrial biopsy were reviewed.   Risks of the biopsy including cramping, bleeding, infection, uterine perforation, inadequate specimen and need for additional procedures  were discussed. The patient states she understands and agrees to undergo procedure today. Consent was signed. Time out was performed. Urine HCG was negative. A sterile speculum was placed in the patient's vagina and the cervix was prepped with Betadine. A single-toothed tenaculum was placed on the anterior lip of the cervix to stabilize it. The uterine cavity was sounded to a depth of 10 cm using the uterine sound. The 3 mm pipelle  was introduced into the endometrial cavity without difficulty, 2 passes were made.  A  moderate amount of tissue was  sent to pathology. The instruments were removed from the patient's vagina. Minimal bleeding from the cervix was noted. The patient tolerated the procedure well.  Routine post-procedure instructions were given to the patient. The patient will follow up in two weeks to review the results and for further management.   - Pelvic ultrasound also ordered - Medical management with Meg[facility] - Patient with normal mammogram in 55/2018 - patient will be contacted with results

## 2016-12-15 NOTE — Patient Instructions (Signed)
Abnormal Uterine Bleeding Abnormal uterine bleeding can affect women at various stages in life, including teenagers, women in their reproductive years, pregnant women, and women who have reached menopause. Several kinds of uterine bleeding are considered abnormal, including:  Bleeding or spotting between periods.  Bleeding after sexual intercourse.  Bleeding that is heavier or more than normal.  Periods that last longer than usual.  Bleeding after menopause. Many cases of abnormal uterine bleeding are minor and simple to treat, while others are more serious. Any type of abnormal bleeding should be evaluated by your health care provider. Treatment will depend on the cause of the bleeding. Follow these instructions at home: Monitor your condition for any changes. The following actions may help to alleviate any discomfort you are experiencing:  Avoid the use of tampons and douches as directed by your health care provider.  Change your pads frequently. You should get regular pelvic exams and Pap tests. Keep all follow-up appointments for diagnostic tests as directed by your health care provider. Contact a health care provider if:  Your bleeding lasts more than 1 week.  You feel dizzy at times. Get help right away if:  You pass out.  You are changing pads every 15 to 30 minutes.  You have abdominal pain.  You have a fever.  You become sweaty or weak.  You are passing large blood clots from the vagina.  You start to feel nauseous and vomit. This information is not intended to replace advice given to you by your health care provider. Make sure you discuss any questions you have with your health care provider. Document Released: 04/17/2005 Document Revised: 09/29/2015 Document Reviewed: 11/14/2012 Elsevier Interactive Patient Education  2017 Elsevier Inc.  

## 2016-12-18 LAB — CYTOLOGY - PAP
Diagnosis: NEGATIVE
HPV (WINDOPATH): NOT DETECTED

## 2016-12-20 ENCOUNTER — Telehealth: Payer: Self-pay | Admitting: General Practice

## 2016-12-20 NOTE — Telephone Encounter (Signed)
Patient called into front office requesting test results. Informed patient of negative results. Patient verbalized understanding & had no questions

## 2016-12-25 ENCOUNTER — Encounter (HOSPITAL_COMMUNITY): Payer: Self-pay

## 2016-12-25 ENCOUNTER — Ambulatory Visit (HOSPITAL_COMMUNITY)
Admission: RE | Admit: 2016-12-25 | Discharge: 2016-12-25 | Disposition: A | Discharge status: Home / Self Care | Payer: PPO | Source: Ambulatory Visit | Attending: Obstetrics and Gynecology | Admitting: Obstetrics and Gynecology

## 2016-12-25 ENCOUNTER — Telehealth (INDEPENDENT_AMBULATORY_CARE_PROVIDER_SITE_OTHER): Payer: Self-pay | Admitting: Orthopedic Surgery

## 2016-12-25 DIAGNOSIS — N83202 Unspecified ovarian cyst, left side: Secondary | ICD-10-CM | POA: Insufficient documentation

## 2016-12-25 DIAGNOSIS — D251 Intramural leiomyoma of uterus: Secondary | ICD-10-CM | POA: Insufficient documentation

## 2016-12-25 DIAGNOSIS — N938 Other specified abnormal uterine and vaginal bleeding: Secondary | ICD-10-CM | POA: Diagnosis present

## 2016-12-25 NOTE — Telephone Encounter (Signed)
Patient called for appt. for L knee swollen. Wants to come in this week but schedule is full.  pts callback @ [phone]. (Next available would be 9/5)

## 2016-12-25 NOTE — Telephone Encounter (Signed)
IC s/w patient and appt scheduled. Scheduled for workin. Advised patient would likely have to wait.

## 2016-12-28 ENCOUNTER — Ambulatory Visit: Payer: Self-pay | Admitting: Obstetrics & Gynecology

## 2016-12-28 ENCOUNTER — Ambulatory Visit (INDEPENDENT_AMBULATORY_CARE_PROVIDER_SITE_OTHER): Payer: PPO | Admitting: Orthopedic Surgery

## 2016-12-28 ENCOUNTER — Encounter (INDEPENDENT_AMBULATORY_CARE_PROVIDER_SITE_OTHER): Payer: Self-pay | Admitting: Orthopedic Surgery

## 2016-12-28 ENCOUNTER — Ambulatory Visit (INDEPENDENT_AMBULATORY_CARE_PROVIDER_SITE_OTHER): Payer: PPO

## 2016-12-28 ENCOUNTER — Telehealth: Payer: Self-pay

## 2016-12-28 DIAGNOSIS — M25562 Pain in left knee: Secondary | ICD-10-CM

## 2016-12-28 DIAGNOSIS — G8929 Other chronic pain: Secondary | ICD-10-CM

## 2016-12-28 MED ORDER — MELOXICAM 7.5 MG PO TABS: 7.5000 mg | ORAL_TABLET | Freq: Every day | ORAL | 0 refills | Status: DC | PRN

## 2016-12-28 NOTE — Telephone Encounter (Signed)
Please inform patient of negative endometrial biopsy   Left message for her to call us back.

## 2016-12-29 ENCOUNTER — Telehealth: Payer: Self-pay | Admitting: General Practice

## 2016-12-29 NOTE — Telephone Encounter (Signed)
Patient called into front office and requested a call back. Called patient and she states this just isn't sitting right with her and she heard what I said but she wants these fibroids removed. Discussed with patient that it is highly likely she will not do surgery given the size of the fibroids and the likelihood of them returning after surgery. Patient states she isn't going to think about that. Patient states she just wants them removed and is going to pray they don't come back. Told patient I cannot make appts but will let the front office know she wants an appt with Dr Elly Modena. Patient verbalized understanding & had no questions

## 2016-12-29 NOTE — Telephone Encounter (Signed)
Patient called into front office requesting results. Reviewed results with patient per Dr Elly Modena. Patient verbalized understanding and asked what fibroids were. Reviewed with patient and informed her they are not cancerous, nor ever will be. Discussed fibroids rarely cause problems to the point where surgery is needed especially given the size of hers. Patient verbalized understanding and asked how many and how big they were. Told patient she has several and the radiologist didn't quantify how many but they are less than a half inch. Patient verbalized understanding and states doesn't she need surgery. Told patient she does not because fibroids rarely cause problems where surgery is needed and if fibroids are removed, they can just come right back. Patient verbalized understanding and states she is just trying to get herself checked out and wants to make sure she won't get cancer. Reiterated to patient that fibroids are not cancerous and if she has continued heavy bleeding despite taking megace, she can return to the office to see Dr Elly Modena. Patient verbalized understanding and asked if they can interfere with pregnancy. Told patient usually not and asked if she was wanting to get pregnant. Patient states no she was just wondering. Patient had no other questions at this time and will call back if she has bleeding issues or concerns.

## 2016-12-29 NOTE — Telephone Encounter (Signed)
-----   Message from Mora Bellman, MD sent at 12/25/2016  6:47 PM EDT ----- Please inform patient of negative endometrial biopsy for cancer or polyp and small uterus with multiple fibroids. Continue medical management with megace and keep menstrual calendar.  Thanks  [employer]

## 2016-12-31 NOTE — Progress Notes (Signed)
Office Visit Note   Patient: Kathleen Mcfarland           Date of Birth: 1960-06-12           MRN: [id number] Visit Date: 12/28/2016 Requested by: Junie Panning, NP [address] [place], [place] [address] PCP: Smothers, Andree Elk, NP  Subjective: Chief Complaint  Patient presents with  . Left Knee - Pain    HPI: Kathleen Mcfarland is a 56 year old patient with left knee pain.  She reports recurrent pain and swelling for the past 2 weeks.  Started after exercising at the gym.  She reports weakness and giving way.  She states is hard for her to step down on her left leg.  This locked up on several occasions.  She does have a history of taking hydrocodone for pain.  MRI scan December 2017 show primarily patellofemoral arthritis.  Weather changes makes her symptoms worse.  She is disabled.              ROS: All systems reviewed are negative as they relate to the chief complaint within the history of present illness.  Patient denies  fevers or chills.   Assessment & Plan: Visit Diagnoses:  1. Chronic pain of left knee     Plan: Impression is exacerbation of patellofemoral arthritis.  Plan is open patella knee sleeve and mobile.  Continue quad strengthening exercises which don't put too much pressure on the patella.  She may be a candidate for patellofemoral arthroplasty if her symptoms worsen I don't think arthroscopic intervention is indicated at this time follow-up with me as needed  Follow-Up Instructions: Return if symptoms worsen or fail to improve.   Orders:  Orders Placed This Encounter  Procedures  . XR KNEE 3 VIEW LEFT   Meds ordered this encounter  Medications  . meloxicam (MOBIC) 7.5 MG tablet    Sig: Take 1 tablet (7.5 mg total) by mouth daily as needed for pain.    Dispense:  30 tablet    Refill:  0      Procedures: No procedures performed   Clinical Data: No additional findings.  Objective: Vital Signs: There were no vitals taken for this  visit.  Physical Exam:   Constitutional: Patient appears well-developed HEENT:  Head: Normocephalic Eyes:EOM are normal Neck: Normal range of motion Cardiovascular: Normal rate Pulmonary/chest: Effort normal Neurologic: Patient is alert Skin: Skin is warm Psychiatric: Patient has normal mood and affect    Ortho Exam: Orthopedic exam demonstrates pretty normal gait alignment trace effusion left knee with mild patellofemoral crepitus collateral crucial ligaments are stable there is no focal joint line tenderness but there is periretinacular tenderness.  No groin pain with internal/external rotation of the leg.  Range of motion is full.  Extensor mechanism is intact.  Specialty Comments:  No specialty comments available.  Imaging: No results found.   PMFS History: Patient Active Problem List   Diagnosis Date Noted  . Abnormal perimenopausal bleeding 03/04/2015  . Suicidal ideation   . Back muscle spasm 01/30/2014  . Severe recurrent major depressive disorder with psychotic features ([facility]) 01/03/2014  . Depression 01/02/2014  . Urinary incontinence 07/21/2010  . MENSTRUAL DISORDER 01/12/2009  . HYPERTENSION, BENIGN ESSENTIAL 07/31/2006  . Hypothyroidism (acquired) 06/23/2006  . ALLERGIC RHINITIS 06/23/2006  . Osteoarthrosis involving lower leg 06/23/2006   Past Medical History:  Diagnosis Date  . Allergic rhinitis   . Anxiety   . Back pain   . Complication of anesthesia  hard to wake up  . Depression   . Hypertension   . Hypothyroidism   . Osteoarthritis 2012   bilateral knees  . Osteoarthritis of knee     Family History  Problem Relation Age of Onset  . Coronary artery disease Father 38       heart disease diagnoised  . Hypertension Father   . Heart disease Father   . Colon cancer Father   . Diabetes Mother   . Hypertension Mother   . Colon cancer Unknown        great uncle at 17, great aunt dx at 76, GM age 53  . Colon polyps Unknown        2 sisters  dx at age 8 and 63  . Arthritis Sister   . Hypertension Sister   . Diabetes Sister   . Colon polyps Sister   . Colon cancer Maternal Grandmother   . Ovarian cancer Maternal Grandmother   . Cirrhosis Maternal Grandfather   . Heart disease Paternal Grandmother   . Cervical cancer Sister   . Other Sister        uterine fibroids  . Hypertension Sister   . Diabetes Sister   . Colon polyps Sister   . Colon polyps Brother   . Breast cancer Neg Hx   . Stroke Neg Hx     Past Surgical History:  Procedure Laterality Date  . colonscopy     . DILATION AND CURETTAGE OF UTERUS    . ROTATOR CUFF REPAIR    . SHOULDER ARTHROSCOPY  04/13/2011   Procedure: ARTHROSCOPY SHOULDER;  Surgeon: Ninetta Lights, MD;  Location: [facility];  Service: Orthopedics;  Laterality: Right;  Right Shoulder Arthroscopy with Acrmioploasty, Arhtroscopic rotator cuff repair and Microfracture humeral head  . SHOULDER ARTHROSCOPY WITH ROTATOR CUFF REPAIR Right 07/04/2012   Procedure: SHOULDER ARTHROSCOPY WITH ROTATOR CUFF REPAIR;  Surgeon: Ninetta Lights, MD;  Location: [facility];  Service: Orthopedics;  Laterality: Right;  RIGHT SHOULDER ARTHROSCOPY WITH ARTHROSCOPIC ROTATOR CUFF REPAIR, LYSIS OF ADHESIONS  . TUBAL LIGATION     Social History   Occupational History  . None    Social History Main Topics  . Smoking status: Never Smoker  . Smokeless tobacco: Never Used  . Alcohol use Yes     Comment: glass of wine once a month  . Drug use: No  . Sexual activity: Yes    Birth control/ protection: Other-see comments     Comment: BTL

## 2017-01-12 ENCOUNTER — Ambulatory Visit: Payer: Self-pay | Admitting: Obstetrics and Gynecology

## 2017-01-12 ENCOUNTER — Telehealth: Payer: Self-pay | Admitting: Obstetrics and Gynecology

## 2017-01-12 NOTE — Telephone Encounter (Signed)
Patient called to say she could not come in today due to the weather. Did not want to see any other Provider but Dr. Elly Modena. Patient stated she wanted to see her at [facility] location. Transferred call to [facility].

## 2017-02-05 ENCOUNTER — Ambulatory Visit: Payer: Self-pay | Admitting: Obstetrics and Gynecology

## 2017-02-20 ENCOUNTER — Ambulatory Visit: Payer: Self-pay | Admitting: Obstetrics and Gynecology

## 2017-02-28 DIAGNOSIS — M25512 Pain in left shoulder: Secondary | ICD-10-CM | POA: Diagnosis not present

## 2017-03-09 DIAGNOSIS — M25512 Pain in left shoulder: Secondary | ICD-10-CM | POA: Diagnosis not present

## 2017-05-02 ENCOUNTER — Ambulatory Visit: Payer: Self-pay | Admitting: Obstetrics and Gynecology

## 2017-05-03 DIAGNOSIS — H04123 Dry eye syndrome of bilateral lacrimal glands: Secondary | ICD-10-CM | POA: Diagnosis not present

## 2017-05-03 DIAGNOSIS — H40033 Anatomical narrow angle, bilateral: Secondary | ICD-10-CM | POA: Diagnosis not present

## 2017-05-14 DIAGNOSIS — M7502 Adhesive capsulitis of left shoulder: Secondary | ICD-10-CM | POA: Diagnosis not present

## 2017-05-24 ENCOUNTER — Ambulatory Visit: Payer: Self-pay | Admitting: Obstetrics and Gynecology

## 2017-05-25 ENCOUNTER — Ambulatory Visit: Payer: PPO | Admitting: Family Medicine

## 2017-06-04 ENCOUNTER — Ambulatory Visit (INDEPENDENT_AMBULATORY_CARE_PROVIDER_SITE_OTHER): Payer: PPO | Admitting: Family Medicine

## 2017-06-04 ENCOUNTER — Ambulatory Visit: Payer: PPO | Admitting: Family Medicine

## 2017-06-04 ENCOUNTER — Other Ambulatory Visit: Payer: Self-pay

## 2017-06-04 ENCOUNTER — Encounter: Payer: Self-pay | Admitting: Family Medicine

## 2017-06-04 VITALS — BP 124/78 | HR 97 | Temp 97.8°F | Resp 16 | Ht 68.0 in | Wt 238.4 lb

## 2017-06-04 DIAGNOSIS — R7303 Prediabetes: Secondary | ICD-10-CM | POA: Insufficient documentation

## 2017-06-04 DIAGNOSIS — Z860100 Personal history of colon polyps, unspecified: Secondary | ICD-10-CM | POA: Insufficient documentation

## 2017-06-04 DIAGNOSIS — Z7689 Persons encountering health services in other specified circumstances: Secondary | ICD-10-CM

## 2017-06-04 DIAGNOSIS — R599 Enlarged lymph nodes, unspecified: Secondary | ICD-10-CM | POA: Diagnosis not present

## 2017-06-04 DIAGNOSIS — IMO0002 Reserved for concepts with insufficient information to code with codable children: Secondary | ICD-10-CM

## 2017-06-04 DIAGNOSIS — N951 Menopausal and female climacteric states: Secondary | ICD-10-CM | POA: Insufficient documentation

## 2017-06-04 DIAGNOSIS — I10 Essential (primary) hypertension: Secondary | ICD-10-CM

## 2017-06-04 DIAGNOSIS — M1710 Unilateral primary osteoarthritis, unspecified knee: Secondary | ICD-10-CM

## 2017-06-04 DIAGNOSIS — E039 Hypothyroidism, unspecified: Secondary | ICD-10-CM | POA: Diagnosis not present

## 2017-06-04 DIAGNOSIS — F4381 Prolonged grief disorder: Secondary | ICD-10-CM

## 2017-06-04 DIAGNOSIS — F4321 Adjustment disorder with depressed mood: Secondary | ICD-10-CM | POA: Diagnosis not present

## 2017-06-04 MED ORDER — ESCITALOPRAM OXALATE 10 MG PO TABS: 10.0000 mg | ORAL_TABLET | Freq: Every day | ORAL | 2 refills | Status: DC

## 2017-06-04 MED ORDER — LEVOTHYROXINE SODIUM 200 MCG PO TABS: 200.0000 ug | ORAL_TABLET | Freq: Every day | ORAL | 1 refills | Status: DC

## 2017-06-04 MED ORDER — IBUPROFEN 800 MG PO TABS: 800.0000 mg | ORAL_TABLET | Freq: Three times a day (TID) | ORAL | 3 refills | Status: DC | PRN

## 2017-06-04 MED ORDER — TRIAMTERENE-HCTZ 37.5-25 MG PO TABS: ORAL_TABLET | ORAL | 1 refills | Status: DC

## 2017-06-04 NOTE — Patient Instructions (Addendum)
  For therapy -- 1. [facility] ([place], [place], [place]) [phone]  2. [facility] [place]) [phone] 3. [facility] - [phone] 4. [facility] - [phone] (do not file insurance) 5. [facility] accepts most major insurances. [phone] or email [email or web address]   IF you received an x-ray today, you will receive an invoice from [facility]. Please contact [facility] at [phone] with questions or concerns regarding your invoice.   IF you received labwork today, you will receive an invoice from [facility]. Please contact [facility] at [phone] with questions or concerns regarding your invoice.   Our billing staff will not be able to assist you with questions regarding bills from these companies.  You will be contacted with the lab results as soon as they are available. The fastest way to get your results is to activate your My Chart account. Instructions are located on the last page of this paperwork. If you have not heard from [place] regarding the results in 2 weeks, please contact this office.

## 2017-06-04 NOTE — Progress Notes (Signed)
2/4/20199:47 AM  Kathleen Mcfarland [birth year], 57 y.o. female [id number]  Chief Complaint  Patient presents with  . here to estab care  . medication    pt wants to make provider aware of what meds she takes    HPI:   Patient is a 57 y.o. female who presents today to establish care.  1. HTN, well controlled on current medications, no side effects 2. Pre-diabetic, tries to exercise and follow diet, however has not been doing so well since death of her sister in 2023-03-10, has gained about 10 lbs, would like a1c to be checked today 3. Bilateral knee OA, managed by ortho with injections, takes ibu 800mg  as needed, Dr Marlou Sa 4. Left shoulder pain, rotator cuff and bone spurs, Dr Para March, will be having upcoming surgery 4/26, will need pre-op evaluation 5. Hypothyroidism 2/2 total thyroidectomy 2/2 graves, finally became controlled once placed on synthroid instead of generic 6. Grief/depression related to unexpected death of her sister in an [place] in 11/09/18requesting referral for counseling, has not had mood issues in the  Past 7. Hot flashes, mostly at night, menses started to become irregular about a year ago, LMP in dec 2018. Has known "small" uterine fibroids, pap w hpv neg 11/2016, mammo birads 1 06/2016, gyn Dr Elly Modena. She would like medication to help with hot flashes. 8. Reports left sided lymph node that has been present for about 2 years, started after a sinus infection, not changing in size, non tender 9. First degree with colon cancer, had colonoscopy in 08/2016, + tubular adenoma, repeat in 5 years   Depression screen [facility] 2/9 06/04/2017 03/04/2015 07/03/2014  Decreased Interest 0 0 0  Down, Depressed, Hopeless 0 0 0  PHQ - 2 Score 0 0 0    Allergies  Allergen Reactions  . Penicillins Hives  . Felodipine Nausea And Vomiting    Prior to Admission medications   Medication Sig Start Date End Date Taking? Authorizing Provider  Ascorbic Acid (VITAMIN C) 1000 MG tablet Take 1,000 mg by  mouth daily.   Yes [provider]  baclofen (LIORESAL) 10 MG tablet Take 0.5 tablets (5 mg total) by mouth 2 (two) times daily. 11/10/16  Yes Meredith Pel, MD  cholecalciferol (VITAMIN D) 1000 UNITS tablet Take 1 tablet (1,000 Units total) by mouth daily. For bone health 01/07/14  Yes Lindell Spar I, NP  DOCOSAHEXAENOIC ACID PO Take by mouth daily.   Yes [provider]  ibuprofen (ADVIL,MOTRIN) 800 MG tablet Take 1 tablet (800 mg total) by mouth every 8 (eight) hours as needed. 03/04/15  Yes Funches, Adriana Mccallum, MD  levothyroxine (SYNTHROID, LEVOTHROID) 200 MCG tablet Take 1 tablet (200 mcg total) by mouth daily before breakfast. For low thyroid function 03/05/15  Yes Funches, Josalyn, MD  meloxicam (MOBIC) 7.5 MG tablet Take 1 tablet (7.5 mg total) by mouth daily as needed for pain. 12/28/16  Yes Meredith Pel, MD  triamterene-hydrochlorothiazide (MAXZIDE-25) 37.5-25 MG tablet TAKE 1 TABLET BY MOUTH DAILY. FOR HIGH BLOOD PRESSURE 02/25/15  Yes Jegede, Marlena Clipper, MD  vitamin B-12 (CYANOCOBALAMIN) 500 MCG tablet Take 500 mcg by mouth daily.   Yes [provider]    Past Medical History:  Diagnosis Date  . Allergic rhinitis   . Anxiety   . Back pain   . Complication of anesthesia    hard to wake up  . Depression   . Hypertension   . Hypothyroidism   . Osteoarthritis 2012  bilateral knees  . Osteoarthritis of knee     Past Surgical History:  Procedure Laterality Date  . colonscopy     . DILATION AND CURETTAGE OF UTERUS    . ROTATOR CUFF REPAIR    . SHOULDER ARTHROSCOPY  04/13/2011   Procedure: ARTHROSCOPY SHOULDER;  Surgeon: Ninetta Lights, MD;  Location: [facility];  Service: Orthopedics;  Laterality: Right;  Right Shoulder Arthroscopy with Acrmioploasty, Arhtroscopic rotator cuff repair and Microfracture humeral head  . SHOULDER ARTHROSCOPY WITH ROTATOR CUFF REPAIR Right 07/04/2012   Procedure: SHOULDER ARTHROSCOPY WITH ROTATOR CUFF  REPAIR;  Surgeon: Ninetta Lights, MD;  Location: [facility];  Service: Orthopedics;  Laterality: Right;  RIGHT SHOULDER ARTHROSCOPY WITH ARTHROSCOPIC ROTATOR CUFF REPAIR, LYSIS OF ADHESIONS  . TUBAL LIGATION      Social History   Tobacco Use  . Smoking status: Never Smoker  . Smokeless tobacco: Never Used  Substance Use Topics  . Alcohol use: Yes    Comment: glass of wine once a month    Family History  Problem Relation Age of Onset  . Coronary artery disease Father 19       heart disease diagnoised  . Hypertension Father   . Heart disease Father   . Colon cancer Father   . Diabetes Mother   . Hypertension Mother   . Colon cancer Unknown        great uncle at 11, great aunt dx at 4, GM age 54  . Colon polyps Unknown        2 sisters dx at age 59 and 39  . Arthritis Sister   . Hypertension Sister   . Diabetes Sister   . Colon polyps Sister   . Colon cancer Maternal Grandmother   . Ovarian cancer Maternal Grandmother   . Cirrhosis Maternal Grandfather   . Heart disease Paternal Grandmother   . Cervical cancer Sister   . Other Sister        uterine fibroids  . Hypertension Sister   . Diabetes Sister   . Colon polyps Sister   . Colon polyps Brother   . Breast cancer Neg Hx   . Stroke Neg Hx     Review of Systems  Constitutional: Negative for chills and fever.  HENT: Negative for congestion, ear pain and sore throat.   Respiratory: Negative for cough and shortness of breath.   Cardiovascular: Negative for chest pain, palpitations and leg swelling.  Gastrointestinal: Negative for abdominal pain, nausea and vomiting.  Genitourinary: Negative for dysuria and hematuria.  Musculoskeletal: Positive for joint pain. Negative for myalgias.  Neurological: Negative for dizziness and headaches.  Psychiatric/Behavioral: Positive for depression.     OBJECTIVE:  Blood pressure 124/78, pulse 97, temperature 97.8 F (36.6 C), temperature source Oral, resp.  rate 16, height 5\' 8"  (1.727 m), weight 238 lb 6.4 oz (108.1 kg), last menstrual period 04/15/2017, SpO2 98 %.  Physical Exam  Constitutional: She is oriented to person, place, and time and well-developed, well-nourished, and in no distress.  HENT:  Head: Normocephalic and atraumatic.  Right Ear: Hearing, tympanic membrane, external ear and ear canal normal.  Left Ear: Hearing, tympanic membrane, external ear and ear canal normal.  Mouth/Throat: Oropharynx is clear and moist.  Eyes: EOM are normal. Pupils are equal, round, and reactive to light.  Neck: Neck supple.  Cardiovascular: Normal rate, regular rhythm, normal heart sounds and intact distal pulses. Exam reveals no gallop and no friction rub.  No  murmur heard. Pulmonary/Chest: Effort normal and breath sounds normal. She has no wheezes. She has no rales.  Abdominal: Soft. Bowel sounds are normal. She exhibits no distension and no mass. There is no tenderness.  Musculoskeletal: Normal range of motion. She exhibits no edema.  Lymphadenopathy:       Head (left side): Occipital adenopathy present.    She has no cervical adenopathy.  Neurological: She is alert and oriented to person, place, and time. She has normal reflexes. Gait normal.  Skin: Skin is warm and dry.  Psychiatric: Mood and affect normal.  Nursing note and vitals reviewed.     ASSESSMENT and PLAN  1. Hypothyroidism (acquired) - TSH  2. Osteoarthrosis involving lower leg - ibuprofen (ADVIL,MOTRIN) 800 MG tablet; Take 1 tablet (800 mg total) by mouth every 8 (eight) hours as needed.  3. HYPERTENSION, BENIGN ESSENTIAL - triamterene-hydrochlorothiazide (MAXZIDE-25) 37.5-25 MG tablet; TAKE 1 TABLET BY MOUTH DAILY. FOR HIGH BLOOD PRESSURE - CBC with Differential - Comprehensive metabolic panel - Lipid panel  4. Pre-diabetes - Hemoglobin A1c - Lipid panel  5. Enlarged lymph nodes - US Soft Tissue Head/Neck; Future  6. Menopausal syndrome (hot  flashes) Discussed treatment options, given concerns of depression, will do trial of SSRI, med r/se/b reviewed.  7. Grief reaction with prolonged bereavement Contact info for counseling provided.  8. Encounter to establish care PMH, PSH, meds, allergies, Fhx, Shx, reviewed with patient today.  Other orders - levothyroxine (SYNTHROID) 200 MCG tablet; Take 1 tablet (200 mcg total) by mouth daily before breakfast. - escitalopram (LEXAPRO) 10 MG tablet; Take 1 tablet (10 mg total) by mouth daily.  Return in about 7 weeks (around 07/26/2017) for pre-op.    Rutherford Guys, MD Primary [facility] at [facility] [place], [place] [address] Ph.  [phone] Fax [phone]

## 2017-06-05 LAB — COMPREHENSIVE METABOLIC PANEL
ALT: 31 IU/L (ref 0–32)
AST: 27 IU/L (ref 0–40)
Albumin/Globulin Ratio: 1.6 (ref 1.2–2.2)
Albumin: 3.6 g/dL (ref 3.5–5.5)
Alkaline Phosphatase: 49 IU/L (ref 39–117)
BUN/Creatinine Ratio: 23 (ref 9–23)
BUN: 15 mg/dL (ref 6–24)
Bilirubin Total: <0.2 mg/dL (ref 0.0–1.2)
CO2: 22 mmol/L (ref 20–29)
Calcium: 8.8 mg/dL (ref 8.7–10.2)
Chloride: 104 mmol/L (ref 96–106)
Creatinine, Ser: 0.65 mg/dL (ref 0.57–1.00)
GFR calc Af Amer: 116 mL/min/{1.73_m2} (ref >59)
GFR calc non Af Amer: 100 mL/min/{1.73_m2} (ref >59)
Globulin, Total: 2.3 g/dL (ref 1.5–4.5)
Glucose: 143 mg/dL — ABNORMAL HIGH (ref 65–99)
Potassium: 3.8 mmol/L (ref 3.5–5.2)
Sodium: 139 mmol/L (ref 134–144)
Total Protein: 5.9 g/dL — ABNORMAL LOW (ref 6.0–8.5)

## 2017-06-05 LAB — CBC WITH DIFFERENTIAL/PLATELET
Basophils Absolute: 0 10*3/uL (ref 0.0–0.2)
Basos: 0 %
EOS (ABSOLUTE): 0.2 10*3/uL (ref 0.0–0.4)
Eos: 3 %
Hematocrit: 44.3 % (ref 34.0–46.6)
Hemoglobin: 14.5 g/dL (ref 11.1–15.9)
Immature Grans (Abs): 0 10*3/uL (ref 0.0–0.1)
Immature Granulocytes: 0 %
Lymphocytes Absolute: 2 10*3/uL (ref 0.7–3.1)
Lymphs: 34 %
MCH: 26.5 pg — ABNORMAL LOW (ref 26.6–33.0)
MCHC: 32.7 g/dL (ref 31.5–35.7)
MCV: 81 fL (ref 79–97)
Monocytes Absolute: 0.2 10*3/uL (ref 0.1–0.9)
Monocytes: 4 %
Neutrophils Absolute: 3.4 10*3/uL (ref 1.4–7.0)
Neutrophils: 59 %
Platelets: 264 10*3/uL (ref 150–379)
RBC: 5.48 x10E6/uL — ABNORMAL HIGH (ref 3.77–5.28)
RDW: 14.6 % (ref 12.3–15.4)
WBC: 5.9 10*3/uL (ref 3.4–10.8)

## 2017-06-05 LAB — LIPID PANEL
Chol/HDL Ratio: 4.5 ratio — ABNORMAL HIGH (ref 0.0–4.4)
Cholesterol, Total: 148 mg/dL (ref 100–199)
HDL: 33 mg/dL — ABNORMAL LOW (ref >39)
LDL Calculated: 86 mg/dL (ref 0–99)
Triglycerides: 147 mg/dL (ref 0–149)
VLDL Cholesterol Cal: 29 mg/dL (ref 5–40)

## 2017-06-05 LAB — HEMOGLOBIN A1C
Est. average glucose Bld gHb Est-mCnc: 120 mg/dL
Hgb A1c MFr Bld: 5.8 % — ABNORMAL HIGH (ref 4.8–5.6)

## 2017-06-05 LAB — TSH: TSH: 1.48 u[IU]/mL (ref 0.450–4.500)

## 2017-06-11 ENCOUNTER — Other Ambulatory Visit: Payer: Self-pay | Admitting: Family Medicine

## 2017-06-11 ENCOUNTER — Telehealth: Payer: Self-pay

## 2017-06-11 DIAGNOSIS — Z1231 Encounter for screening mammogram for malignant neoplasm of breast: Secondary | ICD-10-CM

## 2017-06-11 NOTE — Telephone Encounter (Signed)
Copied from [place]. Topic: General - Other >> Jun 11, 2017 12:41 PM Carolyn Stare wrote: Pt said the following med is not working for her and that it also gives her diarrhea           Pt said she would per the below med, pt said she does not want generic she wants the actual medicine and her insurance will cover the reg med   [facility]  for anxiety   Pharmacy   [place]  >> Jun 11, 2017 12:51 PM Carolyn Stare wrote:  Pt also would like the doctor know that no one has contacted her about [place] on her thyroid

## 2017-06-12 MED ORDER — ALPRAZOLAM 0.25 MG PO TABS: 0.2500 mg | ORAL_TABLET | Freq: Every day | ORAL | 0 refills | Status: DC | PRN

## 2017-06-12 NOTE — Telephone Encounter (Signed)
Spoke with patient. She did not like lexapro at all, gave her diarrhea. She states that hot flashes are getting better. She googled anxiety meds and would like to try xanax as she feels she needs something for anxiety only on specific occassions and not daily. Had a long discussion regarding anxiety treatment and concerns with benzodiazapine. Patient with informed consent still wants to proceed with xanax, I agreed to do a 15# prescription and assess how often she uses it. Risk, side effects of medication were reviewed. Patient was very adamant that she wants BRAND name only medications. She was also able to get an appointment for her ultrasound. She will followup with me as scheduled. [place] [facility] was reviewed.

## 2017-06-26 ENCOUNTER — Ambulatory Visit
Admission: RE | Admit: 2017-06-26 | Discharge: 2017-06-26 | Disposition: A | Discharge status: Home / Self Care | Payer: PPO | Source: Ambulatory Visit | Attending: Family Medicine | Admitting: Family Medicine

## 2017-06-26 DIAGNOSIS — R599 Enlarged lymph nodes, unspecified: Secondary | ICD-10-CM

## 2017-06-26 DIAGNOSIS — R590 Localized enlarged lymph nodes: Secondary | ICD-10-CM | POA: Diagnosis not present

## 2017-06-29 ENCOUNTER — Telehealth (INDEPENDENT_AMBULATORY_CARE_PROVIDER_SITE_OTHER): Payer: Self-pay | Admitting: Orthopedic Surgery

## 2017-06-29 NOTE — Telephone Encounter (Signed)
Patient requesting refill on muscle spasm medication, she wasn't sure of the name. She asks it to be sent to [facility] on [address]. Please advise # [phone]

## 2017-07-02 MED ORDER — METHOCARBAMOL 500 MG PO TABS: ORAL_TABLET | ORAL | 0 refills | Status: DC

## 2017-07-02 NOTE — Telephone Encounter (Signed)
Sent to pharmacy. Patient advised done.  

## 2017-07-02 NOTE — Telephone Encounter (Signed)
Please advise. Thanks.  

## 2017-07-02 NOTE — Telephone Encounter (Signed)
Ok for robaxin 500 po q 8 # 35

## 2017-07-03 ENCOUNTER — Ambulatory Visit
Admission: RE | Admit: 2017-07-03 | Discharge: 2017-07-03 | Disposition: A | Discharge status: Home / Self Care | Payer: PPO | Source: Ambulatory Visit | Attending: Family Medicine | Admitting: Family Medicine

## 2017-07-03 DIAGNOSIS — Z1231 Encounter for screening mammogram for malignant neoplasm of breast: Secondary | ICD-10-CM | POA: Diagnosis not present

## 2017-07-11 ENCOUNTER — Ambulatory Visit: Payer: PPO | Admitting: Family Medicine

## 2017-07-11 ENCOUNTER — Telehealth: Payer: Self-pay | Admitting: Family Medicine

## 2017-07-11 DIAGNOSIS — R599 Enlarged lymph nodes, unspecified: Secondary | ICD-10-CM

## 2017-07-11 NOTE — Telephone Encounter (Signed)
Dr. Pamella Pert this pt had an appt on 3/13 and missed it due to a time confusion. What she was coming in for was to request a biopsy due to the results of her [place] report. Please advice patient if you are able to order and if you need to speak to her if you could call her to discuss since she missed the appt and feels it was our mistake when she confirmed the appt. She can be reached at  [phone]. Thanks

## 2017-07-11 NOTE — Telephone Encounter (Signed)
Cluster of small benign lymph nodes. No biopsy needed. If she wants a second opinion I am happy to send her to ENT. thanks

## 2017-07-17 NOTE — Telephone Encounter (Signed)
Phone call to patient. Relayed message from provider, she verbalizes understanding. She wants to know why a biopsy is not needed if her lymph nodes have changed from her last ultrasound a few years ago. She would like referral to ENT. Referral pended.  Provider, please sign referral if agreeable.

## 2017-07-17 NOTE — Telephone Encounter (Signed)
Referral signed.

## 2017-07-26 ENCOUNTER — Ambulatory Visit: Payer: PPO | Admitting: Family Medicine

## 2017-08-09 ENCOUNTER — Telehealth: Payer: Self-pay | Admitting: Family Medicine

## 2017-08-09 NOTE — Telephone Encounter (Signed)
Spoke to pt about moving appt with Dr. Pamella Pert. There was conflicting dates for her surgery date but after speaking with pt, it is confirmed that her surgery is 09/26/17. She will be keeping her appt with Pamella Pert 08/21/17.

## 2017-08-15 ENCOUNTER — Telehealth: Payer: Self-pay

## 2017-08-15 DIAGNOSIS — R590 Localized enlarged lymph nodes: Secondary | ICD-10-CM | POA: Diagnosis not present

## 2017-08-15 NOTE — Telephone Encounter (Signed)
Pre-op clearance forms received from [facility].  Forms placed in provider's box.  Pt has OV with Dr. Pamella Pert on 04/23.

## 2017-08-21 ENCOUNTER — Other Ambulatory Visit: Payer: Self-pay

## 2017-08-21 ENCOUNTER — Encounter: Payer: Self-pay | Admitting: Family Medicine

## 2017-08-21 ENCOUNTER — Ambulatory Visit (INDEPENDENT_AMBULATORY_CARE_PROVIDER_SITE_OTHER): Payer: PPO | Admitting: Family Medicine

## 2017-08-21 VITALS — BP 130/60 | HR 100 | Temp 98.6°F | Ht 69.0 in | Wt 237.0 lb

## 2017-08-21 DIAGNOSIS — G8929 Other chronic pain: Secondary | ICD-10-CM | POA: Diagnosis not present

## 2017-08-21 DIAGNOSIS — J302 Other seasonal allergic rhinitis: Secondary | ICD-10-CM

## 2017-08-21 DIAGNOSIS — Z01818 Encounter for other preprocedural examination: Secondary | ICD-10-CM | POA: Diagnosis not present

## 2017-08-21 DIAGNOSIS — N393 Stress incontinence (female) (male): Secondary | ICD-10-CM

## 2017-08-21 DIAGNOSIS — M25512 Pain in left shoulder: Secondary | ICD-10-CM

## 2017-08-21 DIAGNOSIS — F4321 Adjustment disorder with depressed mood: Secondary | ICD-10-CM | POA: Diagnosis not present

## 2017-08-21 DIAGNOSIS — F4381 Prolonged grief disorder: Secondary | ICD-10-CM

## 2017-08-21 LAB — POCT URINALYSIS DIP (MANUAL ENTRY)
Bilirubin, UA: NEGATIVE
Blood, UA: NEGATIVE
Glucose, UA: NEGATIVE mg/dL
Ketones, POC UA: NEGATIVE mg/dL
Leukocytes, UA: NEGATIVE
Nitrite, UA: NEGATIVE
Protein Ur, POC: NEGATIVE mg/dL
Spec Grav, UA: 1.025 (ref 1.010–1.025)
Urobilinogen, UA: 0.2 E.U./dL
pH, UA: 5.5 (ref 5.0–8.0)

## 2017-08-21 MED ORDER — ALPRAZOLAM 0.25 MG PO TABS: 0.2500 mg | ORAL_TABLET | Freq: Every day | ORAL | 0 refills | Status: DC | PRN

## 2017-08-21 MED ORDER — CETIRIZINE HCL 10 MG PO TABS: 10.0000 mg | ORAL_TABLET | Freq: Every day | ORAL | 6 refills | Status: DC

## 2017-08-21 MED ORDER — FLUTICASONE PROPIONATE 50 MCG/ACT NA SUSP: 1.0000 | Freq: Two times a day (BID) | NASAL | 6 refills | Status: DC

## 2017-08-21 NOTE — Patient Instructions (Addendum)
   IF you received an x-ray today, you will receive an invoice from [facility]. Please contact [facility] at [phone] with questions or concerns regarding your invoice.   IF you received labwork today, you will receive an invoice from [facility]. Please contact [facility] at [phone] with questions or concerns regarding your invoice.   Our billing staff will not be able to assist you with questions regarding bills from these companies.  You will be contacted with the lab results as soon as they are available. The fastest way to get your results is to activate your My Chart account. Instructions are located on the last page of this paperwork. If you have not heard from us regarding the results in 2 weeks, please contact this office.    Urinary Incontinence Urinary incontinence is the involuntary loss of urine from your bladder. What are the causes? There are many causes of urinary incontinence. They include:  Medicines.  Infections.  Prostatic enlargement, leading to overflow of urine from your bladder.  Surgery.  Neurological diseases.  Emotional factors.  What are the signs or symptoms? Urinary Incontinence can be divided into four types: 1. Urge incontinence. Urge incontinence is the involuntary loss of urine before you have the opportunity to go to the bathroom. There is a sudden urge to void but not enough time to reach a bathroom. 2. Stress incontinence. Stress incontinence is the sudden loss of urine with any activity that forces urine to pass. It is commonly caused by anatomical changes to the pelvis and sphincter areas of your body. 3. Overflow incontinence. Overflow incontinence is the loss of urine from an obstructed opening to your bladder. This results in a backup of urine and a resultant buildup of pressure within the bladder. When the pressure within the bladder exceeds the closing pressure of the sphincter, the urine overflows, which causes  incontinence, similar to water overflowing a dam. 4. Total incontinence. Total incontinence is the loss of urine as a result of the inability to store urine within your bladder.  How is this diagnosed? Evaluating the cause of incontinence may require:  A thorough and complete medical and obstetric history.  A complete physical exam.  Laboratory tests such as a urine culture and sensitivities.  When additional tests are indicated, they can include:  An ultrasound exam.  Kidney and bladder X-rays.  Cystoscopy. This is an exam of the bladder using a narrow scope.  Urodynamic testing to test the nerve function to the bladder and sphincter areas.  How is this treated? Treatment for urinary incontinence depends on the cause:  For urge incontinence caused by a bacterial infection, antibiotics will be prescribed. If the urge incontinence is related to medicines you take, your health care provider may have you change the medicine.  For stress incontinence, surgery to re-establish anatomical support to the bladder or sphincter, or both, will often correct the condition.  For overflow incontinence caused by an enlarged prostate, an operation to open the channel through the enlarged prostate will allow the flow of urine out of the bladder. In women with fibroids, a hysterectomy may be recommended.  For total incontinence, surgery on your urinary sphincter may help. An artificial urinary sphincter (an inflatable cuff placed around the urethra) may be required. In women who have developed a hole-like passage between their bladder and vagina (vesicovaginal fistula), surgery to close the fistula often is required.  Follow these instructions at home:  Normal daily hygiene and the use of pads or   adult diapers that are changed regularly will help prevent odors and skin damage.  Avoid caffeine. It can overstimulate your bladder.  Use the bathroom regularly. Try about every 2-3 hours to go to the  bathroom, even if you do not feel the need to do so. Take time to empty your bladder completely. After urinating, wait a minute. Then try to urinate again.  For causes involving nerve dysfunction, keep a log of the medicines you take and a journal of the times you go to the bathroom. Contact a health care provider if:  You experience worsening of pain instead of improvement in pain after your procedure.  Your incontinence becomes worse instead of better. Get help right away if:  You experience fever or shaking chills.  You are unable to pass your urine.  You have redness spreading into your groin or down into your thighs. This information is not intended to replace advice given to you by your health care provider. Make sure you discuss any questions you have with your health care provider. Document Released: 05/25/2004 Document Revised: 11/26/2015 Document Reviewed: 09/24/2012 Elsevier Interactive Patient Education  2018 [employer]  

## 2017-08-21 NOTE — Progress Notes (Signed)
4/23/201911:29 AM  Kathleen Mcfarland 1961-03-01, 57 y.o. female [id number]  Chief Complaint  Patient presents with  . Pre-op Exam     surgery is scheduled for 5/29  . Medication Refill    Xanax refill, also asking for allergy meds.    HPI:   Patient is a 57 y.o. female with past medical history significant for HTN, hypothyroidism, prediabetes who presents today for pre-op evaluation for left shoulder scope and rotator cuff repair  She had her CPE in [place] 2019 Labs at that time unremarkable, incl normal crt and a1c 5.8 She denies h/o MI, CAD, CHF, CVA She exercises   Requesting medication for allergies, sneezing all the time which is causing leak of urine, denies any dysuria, hematuria Has been taking OTC loratidine wo improvement in symptoms Alprazolam last refilled feb 2019, #15, [place] csr reviewed, doing better regarding grief  Depression screen [facility] 2/9 08/21/2017 06/04/2017 03/04/2015  Decreased Interest 0 0 0  Down, Depressed, Hopeless 0 0 0  PHQ - 2 Score 0 0 0    Allergies  Allergen Reactions  . Penicillins Hives  . Felodipine Nausea And Vomiting    Prior to Admission medications   Medication Sig Start Date End Date Taking? Authorizing Provider  ALPRAZolam (XANAX) 0.25 MG tablet Take 1 tablet (0.25 mg total) by mouth daily as needed for anxiety. 06/12/17  Yes Rutherford Guys, MD  Ascorbic Acid (VITAMIN C) 1000 MG tablet Take 1,000 mg by mouth daily.   Yes [provider]  baclofen (LIORESAL) 10 MG tablet Take 0.5 tablets (5 mg total) by mouth 2 (two) times daily. 11/10/16  Yes Meredith Pel, MD  cholecalciferol (VITAMIN D) 1000 UNITS tablet Take 1 tablet (1,000 Units total) by mouth daily. For bone health 01/07/14  Yes Lindell Spar I, NP  levothyroxine (SYNTHROID) 200 MCG tablet Take 1 tablet (200 mcg total) by mouth daily before breakfast. 06/04/17  Yes Rutherford Guys, MD  vitamin B-12 (CYANOCOBALAMIN) 500 MCG tablet Take 500 mcg by mouth daily.   Yes  [provider]  DOCOSAHEXAENOIC ACID PO Take by mouth daily.    [provider]  triamterene-hydrochlorothiazide (MAXZIDE-25) 37.5-25 MG tablet TAKE 1 TABLET BY MOUTH DAILY. FOR HIGH BLOOD PRESSURE  06/04/17   Rutherford Guys, MD  IBUPROFEN 824m q8 hours   Past Medical History:  Diagnosis Date  . Allergic rhinitis   . Anxiety   . Back pain   . Cataract   . Complication of anesthesia    hard to wake up  . Depression   . Hypertension   . Hypothyroidism   . Osteoarthritis 2012   bilateral knees  . Osteoarthritis of knee   . Osteoporosis     Past Surgical History:  Procedure Laterality Date  . colonscopy     . DILATION AND CURETTAGE OF UTERUS    . ROTATOR CUFF REPAIR    . SHOULDER ARTHROSCOPY  04/13/2011   Procedure: ARTHROSCOPY SHOULDER;  Surgeon: DNinetta Lights MD;  [place] [facility]  Service: Orthopedics;  Laterality: Right;  Right Shoulder Arthroscopy with Acrmioploasty, Arhtroscopic rotator cuff repair and Microfracture humeral head  . SHOULDER ARTHROSCOPY WITH ROTATOR CUFF REPAIR Right 07/04/2012   Procedure: SHOULDER ARTHROSCOPY WITH ROTATOR CUFF REPAIR;  Surgeon: DNinetta Lights MD;  Location: [facility]  Service: Orthopedics;  Laterality: Right;  RIGHT SHOULDER ARTHROSCOPY WITH ARTHROSCOPIC ROTATOR CUFF REPAIR, LYSIS OF ADHESIONS  . TUBAL LIGATION  Social History   Tobacco Use  . Smoking status: Never Smoker  . Smokeless tobacco: Never Used  Substance Use Topics  . Alcohol use: Yes    Comment: glass of wine once a month    Family History  Problem Relation Age of Onset  . Coronary artery disease Father 24       heart disease diagnoised  . Hypertension Father   . Heart disease Father   . Colon cancer Father   . Diabetes Father   . Hyperlipidemia Father   . Diabetes Mother   . Hypertension Mother   . Colon cancer Unknown        great uncle at 76, great aunt dx at 78, GM age 29  . Colon polyps  Unknown        2 sisters dx at age 11 and 28  . Arthritis Sister   . Hypertension Sister   . Diabetes Sister   . Colon polyps Sister   . Colon cancer Maternal Grandmother   . Ovarian cancer Maternal Grandmother   . Cirrhosis Maternal Grandfather   . Heart disease Paternal Grandmother   . Cervical cancer Sister   . Other Sister        uterine fibroids  . Hypertension Sister   . Diabetes Sister   . Colon polyps Sister   . Colon polyps Brother   . Breast cancer Neg Hx   . Stroke Neg Hx     Review of Systems  Constitutional: Negative for chills, fever and malaise/fatigue.  HENT: Positive for congestion. Negative for ear pain, sinus pain and sore throat.   Eyes: Negative for blurred vision and double vision.  Respiratory: Positive for cough. Negative for shortness of breath and wheezing.   Cardiovascular: Negative for chest pain, palpitations and leg swelling.  Gastrointestinal: Negative for abdominal pain, constipation, diarrhea, nausea and vomiting.  Genitourinary: Positive for frequency. Negative for dysuria, hematuria and urgency.  Musculoskeletal: Positive for joint pain.  Neurological: Negative for dizziness, tingling, speech change, focal weakness and headaches.  Psychiatric/Behavioral: Negative for substance abuse and suicidal ideas.     OBJECTIVE:  Blood pressure 130/60, pulse 100, temperature 98.6 F (37 C), temperature source Oral, height 5' 9" (1.753 m), weight 237 lb (107.5 kg), SpO2 99 %.  Physical Exam  Constitutional: She is oriented to person, place, and time. She appears well-developed and well-nourished.  HENT:  Head: Normocephalic and atraumatic.  Right Ear: Hearing, tympanic membrane, external ear and ear canal normal.  Left Ear: Hearing, tympanic membrane, external ear and ear canal normal.  Mouth/Throat: Oropharynx is clear and moist.  Eyes: Pupils are equal, round, and reactive to light. Conjunctivae and EOM are normal.  Neck: Neck supple. No  thyromegaly present.  Cardiovascular: Normal rate, regular rhythm, normal heart sounds and intact distal pulses. Exam reveals no gallop and no friction rub.  No murmur heard. Pulmonary/Chest: Effort normal and breath sounds normal. She has no wheezes. She has no rales.  Abdominal: Soft. Bowel sounds are normal. She exhibits no distension and no mass. There is no tenderness.  Musculoskeletal: Normal range of motion. She exhibits no edema.  Lymphadenopathy:    She has no cervical adenopathy.  Neurological: She is alert and oriented to person, place, and time. She has normal reflexes. No cranial nerve deficit. Gait normal.  Skin: Skin is warm and dry.  Psychiatric: She has a normal mood and affect.  Nursing note and vitals reviewed.  Results for orders placed or performed in visit  on 08/21/17 (from the past 48 hour(s))  POCT urinalysis dipstick     Status: Normal   Collection Time: 08/21/17 11:57 AM  Result Value Ref Range   Color, UA yellow yellow   Clarity, UA clear clear   Glucose, UA negative negative mg/dL   Bilirubin, UA negative negative   Ketones, POC UA negative negative mg/dL   Spec Grav, UA 1.025 1.010 - 1.025   Blood, UA negative negative   pH, UA 5.5 5.0 - 8.0   Protein Ur, POC negative negative mg/dL   Urobilinogen, UA 0.2 0.2 or 1.0 E.U./dL   Nitrite, UA Negative Negative   Leukocytes, UA Negative Negative  CMP14+EGFR     Status: Abnormal   Collection Time: 08/21/17  1:53 PM  Result Value Ref Range   Glucose 112 (H) 65 - 99 mg/dL   BUN 13 6 - 24 mg/dL   Creatinine, Ser 0.77 0.57 - 1.00 mg/dL   GFR calc non Af Amer 87 >59 mL/min/1.73   GFR calc Af Amer 101 >59 mL/min/1.73   BUN/Creatinine Ratio 17 9 - 23   Sodium 138 134 - 144 mmol/L   Potassium 4.0 3.5 - 5.2 mmol/L   Chloride 102 96 - 106 mmol/L   CO2 24 20 - 29 mmol/L   Calcium 8.8 8.7 - 10.2 mg/dL   Total Protein 5.4 (L) 6.0 - 8.5 g/dL   Albumin 3.4 (L) 3.5 - 5.5 g/dL   Globulin, Total 2.0 1.5 - 4.5 g/dL    Albumin/Globulin Ratio 1.7 1.2 - 2.2   Bilirubin Total 0.3 0.0 - 1.2 mg/dL   Alkaline Phosphatase 39 39 - 117 IU/L   AST 22 0 - 40 IU/L   ALT 18 0 - 32 IU/L  CBC with Differential/Platelet     Status: Abnormal   Collection Time: 08/21/17  1:53 PM  Result Value Ref Range   WBC 5.8 3.4 - 10.8 x10E3/uL   RBC 5.01 3.77 - 5.28 x10E6/uL   Hemoglobin 13.0 11.1 - 15.9 g/dL   Hematocrit 40.5 34.0 - 46.6 %   MCV 81 79 - 97 fL   MCH 25.9 (L) 26.6 - 33.0 pg   MCHC 32.1 31.5 - 35.7 g/dL   RDW 14.1 12.3 - 15.4 %   Platelets 303 150 - 379 x10E3/uL   Neutrophils 58 Not Estab. %   Lymphs 34 Not Estab. %   Monocytes 5 Not Estab. %   Eos 2 Not Estab. %   Basos 0 Not Estab. %   Neutrophils Absolute 3.3 1.4 - 7.0 x10E3/uL   Lymphocytes Absolute 1.9 0.7 - 3.1 x10E3/uL   Monocytes Absolute 0.3 0.1 - 0.9 x10E3/uL   EOS (ABSOLUTE) 0.1 0.0 - 0.4 x10E3/uL   Basophils Absolute 0.0 0.0 - 0.2 x10E3/uL   Immature Granulocytes 1 Not Estab. %   Immature Grans (Abs) 0.0 0.0 - 0.1 x10E3/uL    My interpretation of EKG:  NSR, HR 90, normal intervals, no ST changes  ASSESSMENT and PLAN  1. Preop examination Exam, labs and EKG unremarkable. Patient medically optimized, considered low risk operative risk for this moderate risk surgery. - CMP14+EGFR - CBC with Differential/Platelet - EKG 12-Lead  2. Chronic left shoulder pain - CMP14+EGFR - CBC with Differential/Platelet - EKG 12-Lead  3. Stress incontinence of urine - POCT urinalysis dipstick - discussed kiegel exercises  4. Grief reaction with prolonged bereavement - prescription for alprazolam #15 sent, [place] CSR reviewed.   5. Seasonal allergies - fluticasone (FLONASE) 50 MCG/ACT nasal  spray; Place 1 spray into both nostrils 2 (two) times daily. - cetirizine (ZYRTEC) 10 MG tablet; Take 1 tablet (10 mg total) by mouth daily.  Return in about 6 months (around 02/20/2018) for [facility], prediabetes, Hypothyroidism.    Rutherford Guys, MD Primary Care at  [place], [place] [address] Ph.  [phone] Fax [phone]

## 2017-08-22 LAB — CBC WITH DIFFERENTIAL/PLATELET
Basophils Absolute: 0 10*3/uL (ref 0.0–0.2)
Basos: 0 %
EOS (ABSOLUTE): 0.1 10*3/uL (ref 0.0–0.4)
Eos: 2 %
Hematocrit: 40.5 % (ref 34.0–46.6)
Hemoglobin: 13 g/dL (ref 11.1–15.9)
Immature Grans (Abs): 0 10*3/uL (ref 0.0–0.1)
Immature Granulocytes: 1 %
Lymphocytes Absolute: 1.9 10*3/uL (ref 0.7–3.1)
Lymphs: 34 %
MCH: 25.9 pg — ABNORMAL LOW (ref 26.6–33.0)
MCHC: 32.1 g/dL (ref 31.5–35.7)
MCV: 81 fL (ref 79–97)
Monocytes Absolute: 0.3 10*3/uL (ref 0.1–0.9)
Monocytes: 5 %
Neutrophils Absolute: 3.3 10*3/uL (ref 1.4–7.0)
Neutrophils: 58 %
Platelets: 303 10*3/uL (ref 150–379)
RBC: 5.01 x10E6/uL (ref 3.77–5.28)
RDW: 14.1 % (ref 12.3–15.4)
WBC: 5.8 10*3/uL (ref 3.4–10.8)

## 2017-08-22 LAB — CMP14+EGFR
ALT: 18 IU/L (ref 0–32)
AST: 22 IU/L (ref 0–40)
Albumin/Globulin Ratio: 1.7 (ref 1.2–2.2)
Albumin: 3.4 g/dL — ABNORMAL LOW (ref 3.5–5.5)
Alkaline Phosphatase: 39 IU/L (ref 39–117)
BUN/Creatinine Ratio: 17 (ref 9–23)
BUN: 13 mg/dL (ref 6–24)
Bilirubin Total: 0.3 mg/dL (ref 0.0–1.2)
CO2: 24 mmol/L (ref 20–29)
Calcium: 8.8 mg/dL (ref 8.7–10.2)
Chloride: 102 mmol/L (ref 96–106)
Creatinine, Ser: 0.77 mg/dL (ref 0.57–1.00)
GFR calc Af Amer: 101 mL/min/{1.73_m2} (ref >59)
GFR calc non Af Amer: 87 mL/min/{1.73_m2} (ref >59)
Globulin, Total: 2 g/dL (ref 1.5–4.5)
Glucose: 112 mg/dL — ABNORMAL HIGH (ref 65–99)
Potassium: 4 mmol/L (ref 3.5–5.2)
Sodium: 138 mmol/L (ref 134–144)
Total Protein: 5.4 g/dL — ABNORMAL LOW (ref 6.0–8.5)

## 2017-08-22 NOTE — Telephone Encounter (Signed)
Raliegh Ip checking status of Pre-op Clearance. Call back [phone]

## 2017-08-22 NOTE — Telephone Encounter (Signed)
Please advise 

## 2017-08-23 ENCOUNTER — Telehealth: Payer: Self-pay

## 2017-08-23 ENCOUNTER — Encounter: Payer: Self-pay | Admitting: Family Medicine

## 2017-08-23 NOTE — Telephone Encounter (Signed)
My note completed today after reviewing labs. I have forwarded my note to Dr Para March. Please fax hand signed form to his office. Please confirm with office that indeed they are able to see my note, if not then please fax them a copy. thanks

## 2017-08-23 NOTE — Telephone Encounter (Signed)
The coordinator for Kathleen Mcfarland is out of the office. I faxed the information making it attn to [place] as stated on the [place].  [phone] ext [phone]

## 2017-09-26 DIAGNOSIS — M24112 Other articular cartilage disorders, left shoulder: Secondary | ICD-10-CM | POA: Diagnosis not present

## 2017-09-26 DIAGNOSIS — G8918 Other acute postprocedural pain: Secondary | ICD-10-CM | POA: Diagnosis not present

## 2017-09-26 DIAGNOSIS — M75112 Incomplete rotator cuff tear or rupture of left shoulder, not specified as traumatic: Secondary | ICD-10-CM | POA: Diagnosis not present

## 2017-09-26 DIAGNOSIS — S46012A Strain of muscle(s) and tendon(s) of the rotator cuff of left shoulder, initial encounter: Secondary | ICD-10-CM | POA: Diagnosis not present

## 2017-09-26 DIAGNOSIS — M94212 Chondromalacia, left shoulder: Secondary | ICD-10-CM | POA: Diagnosis not present

## 2017-09-26 DIAGNOSIS — M7542 Impingement syndrome of left shoulder: Secondary | ICD-10-CM | POA: Diagnosis not present

## 2017-09-26 DIAGNOSIS — M7552 Bursitis of left shoulder: Secondary | ICD-10-CM | POA: Diagnosis not present

## 2017-09-26 DIAGNOSIS — M7502 Adhesive capsulitis of left shoulder: Secondary | ICD-10-CM | POA: Diagnosis not present

## 2017-10-01 DIAGNOSIS — M6281 Muscle weakness (generalized): Secondary | ICD-10-CM | POA: Diagnosis not present

## 2017-10-01 DIAGNOSIS — M25612 Stiffness of left shoulder, not elsewhere classified: Secondary | ICD-10-CM | POA: Diagnosis not present

## 2017-10-01 DIAGNOSIS — M25512 Pain in left shoulder: Secondary | ICD-10-CM | POA: Diagnosis not present

## 2017-10-01 DIAGNOSIS — M75122 Complete rotator cuff tear or rupture of left shoulder, not specified as traumatic: Secondary | ICD-10-CM | POA: Diagnosis not present

## 2017-10-05 DIAGNOSIS — M75122 Complete rotator cuff tear or rupture of left shoulder, not specified as traumatic: Secondary | ICD-10-CM | POA: Diagnosis not present

## 2017-10-05 DIAGNOSIS — M6281 Muscle weakness (generalized): Secondary | ICD-10-CM | POA: Diagnosis not present

## 2017-10-05 DIAGNOSIS — M25612 Stiffness of left shoulder, not elsewhere classified: Secondary | ICD-10-CM | POA: Diagnosis not present

## 2017-10-05 DIAGNOSIS — M25512 Pain in left shoulder: Secondary | ICD-10-CM | POA: Diagnosis not present

## 2017-10-08 DIAGNOSIS — M25512 Pain in left shoulder: Secondary | ICD-10-CM | POA: Diagnosis not present

## 2017-10-09 DIAGNOSIS — M75122 Complete rotator cuff tear or rupture of left shoulder, not specified as traumatic: Secondary | ICD-10-CM | POA: Diagnosis not present

## 2017-10-09 DIAGNOSIS — M6281 Muscle weakness (generalized): Secondary | ICD-10-CM | POA: Diagnosis not present

## 2017-10-09 DIAGNOSIS — M25512 Pain in left shoulder: Secondary | ICD-10-CM | POA: Diagnosis not present

## 2017-10-09 DIAGNOSIS — M25612 Stiffness of left shoulder, not elsewhere classified: Secondary | ICD-10-CM | POA: Diagnosis not present

## 2017-10-12 DIAGNOSIS — M6281 Muscle weakness (generalized): Secondary | ICD-10-CM | POA: Diagnosis not present

## 2017-10-12 DIAGNOSIS — M25612 Stiffness of left shoulder, not elsewhere classified: Secondary | ICD-10-CM | POA: Diagnosis not present

## 2017-10-12 DIAGNOSIS — M75122 Complete rotator cuff tear or rupture of left shoulder, not specified as traumatic: Secondary | ICD-10-CM | POA: Diagnosis not present

## 2017-10-12 DIAGNOSIS — M25512 Pain in left shoulder: Secondary | ICD-10-CM | POA: Diagnosis not present

## 2017-10-19 DIAGNOSIS — M25612 Stiffness of left shoulder, not elsewhere classified: Secondary | ICD-10-CM | POA: Diagnosis not present

## 2017-10-19 DIAGNOSIS — M6281 Muscle weakness (generalized): Secondary | ICD-10-CM | POA: Diagnosis not present

## 2017-10-19 DIAGNOSIS — M75122 Complete rotator cuff tear or rupture of left shoulder, not specified as traumatic: Secondary | ICD-10-CM | POA: Diagnosis not present

## 2017-10-19 DIAGNOSIS — M25512 Pain in left shoulder: Secondary | ICD-10-CM | POA: Diagnosis not present

## 2017-10-22 ENCOUNTER — Other Ambulatory Visit: Payer: Self-pay | Admitting: Family Medicine

## 2017-10-22 DIAGNOSIS — I10 Essential (primary) hypertension: Secondary | ICD-10-CM

## 2017-10-25 DIAGNOSIS — M25512 Pain in left shoulder: Secondary | ICD-10-CM | POA: Diagnosis not present

## 2017-10-26 DIAGNOSIS — M25612 Stiffness of left shoulder, not elsewhere classified: Secondary | ICD-10-CM | POA: Diagnosis not present

## 2017-10-26 DIAGNOSIS — M25512 Pain in left shoulder: Secondary | ICD-10-CM | POA: Diagnosis not present

## 2017-10-26 DIAGNOSIS — M75122 Complete rotator cuff tear or rupture of left shoulder, not specified as traumatic: Secondary | ICD-10-CM | POA: Diagnosis not present

## 2017-10-26 DIAGNOSIS — M6281 Muscle weakness (generalized): Secondary | ICD-10-CM | POA: Diagnosis not present

## 2017-10-29 ENCOUNTER — Encounter (INDEPENDENT_AMBULATORY_CARE_PROVIDER_SITE_OTHER): Payer: Self-pay

## 2017-11-05 ENCOUNTER — Ambulatory Visit (INDEPENDENT_AMBULATORY_CARE_PROVIDER_SITE_OTHER): Payer: PPO | Admitting: Orthopedic Surgery

## 2017-11-05 DIAGNOSIS — M75122 Complete rotator cuff tear or rupture of left shoulder, not specified as traumatic: Secondary | ICD-10-CM | POA: Diagnosis not present

## 2017-11-05 DIAGNOSIS — M6281 Muscle weakness (generalized): Secondary | ICD-10-CM | POA: Diagnosis not present

## 2017-11-05 DIAGNOSIS — M25612 Stiffness of left shoulder, not elsewhere classified: Secondary | ICD-10-CM | POA: Diagnosis not present

## 2017-11-05 DIAGNOSIS — M25512 Pain in left shoulder: Secondary | ICD-10-CM | POA: Diagnosis not present

## 2017-11-19 DIAGNOSIS — M75122 Complete rotator cuff tear or rupture of left shoulder, not specified as traumatic: Secondary | ICD-10-CM | POA: Diagnosis not present

## 2017-11-19 DIAGNOSIS — M25612 Stiffness of left shoulder, not elsewhere classified: Secondary | ICD-10-CM | POA: Diagnosis not present

## 2017-11-19 DIAGNOSIS — M6281 Muscle weakness (generalized): Secondary | ICD-10-CM | POA: Diagnosis not present

## 2017-11-19 DIAGNOSIS — M7502 Adhesive capsulitis of left shoulder: Secondary | ICD-10-CM | POA: Diagnosis not present

## 2017-11-22 DIAGNOSIS — M25612 Stiffness of left shoulder, not elsewhere classified: Secondary | ICD-10-CM | POA: Diagnosis not present

## 2017-11-26 DIAGNOSIS — M75122 Complete rotator cuff tear or rupture of left shoulder, not specified as traumatic: Secondary | ICD-10-CM | POA: Diagnosis not present

## 2017-11-26 DIAGNOSIS — M6281 Muscle weakness (generalized): Secondary | ICD-10-CM | POA: Diagnosis not present

## 2017-11-26 DIAGNOSIS — M25612 Stiffness of left shoulder, not elsewhere classified: Secondary | ICD-10-CM | POA: Diagnosis not present

## 2017-11-26 DIAGNOSIS — M25512 Pain in left shoulder: Secondary | ICD-10-CM | POA: Diagnosis not present

## 2017-11-30 ENCOUNTER — Ambulatory Visit (INDEPENDENT_AMBULATORY_CARE_PROVIDER_SITE_OTHER): Payer: PPO | Admitting: Obstetrics and Gynecology

## 2017-11-30 ENCOUNTER — Encounter: Payer: Self-pay | Admitting: Obstetrics and Gynecology

## 2017-11-30 ENCOUNTER — Other Ambulatory Visit (HOSPITAL_COMMUNITY)
Admission: RE | Admit: 2017-11-30 | Discharge: 2017-11-30 | Disposition: A | Discharge status: Home / Self Care | Payer: PPO | Source: Ambulatory Visit | Attending: Obstetrics and Gynecology | Admitting: Obstetrics and Gynecology

## 2017-11-30 VITALS — BP 133/74 | HR 86 | Ht 67.0 in | Wt 238.0 lb

## 2017-11-30 DIAGNOSIS — Z01419 Encounter for gynecological examination (general) (routine) without abnormal findings: Secondary | ICD-10-CM | POA: Insufficient documentation

## 2017-11-30 DIAGNOSIS — Z1151 Encounter for screening for human papillomavirus (HPV): Secondary | ICD-10-CM | POA: Diagnosis not present

## 2017-11-30 NOTE — Progress Notes (Signed)
Subjective:     Kathleen Mcfarland is a 57 y.o. female P2 with BMI 37 and LMP 08/2017 who is here for a comprehensive physical exam. The patient reports no problems. She had a normal mammogram in 06/2017. She is not sexually active. Patient has been amenorrheic since May 2019. She denies pelvic pain or abnormal discharge. She denies urinary incontinence  Past Medical History:  Diagnosis Date  . Allergic rhinitis   . Anxiety   . Back pain   . Cataract   . Complication of anesthesia    hard to wake up  . Depression   . Hypertension   . Hypothyroidism   . Osteoarthritis 2012   bilateral knees  . Osteoarthritis of knee   . Osteoporosis    Past Surgical History:  Procedure Laterality Date  . colonscopy     . DILATION AND CURETTAGE OF UTERUS    . ROTATOR CUFF REPAIR    . SHOULDER ARTHROSCOPY  04/13/2011   Procedure: ARTHROSCOPY SHOULDER;  Surgeon: Ninetta Lights, MD;  Location: [facility];  Service: Orthopedics;  Laterality: Right;  Right Shoulder Arthroscopy with Acrmioploasty, Arhtroscopic rotator cuff repair and Microfracture humeral head  . SHOULDER ARTHROSCOPY WITH ROTATOR CUFF REPAIR Right 07/04/2012   Procedure: SHOULDER ARTHROSCOPY WITH ROTATOR CUFF REPAIR;  Surgeon: Ninetta Lights, MD;  Location: [facility];  Service: Orthopedics;  Laterality: Right;  RIGHT SHOULDER ARTHROSCOPY WITH ARTHROSCOPIC ROTATOR CUFF REPAIR, LYSIS OF ADHESIONS  . TUBAL LIGATION     Family History  Problem Relation Age of Onset  . Coronary artery disease Father 59       heart disease diagnoised  . Hypertension Father   . Heart disease Father   . Colon cancer Father   . Diabetes Father   . Hyperlipidemia Father   . Diabetes Mother   . Hypertension Mother   . Colon cancer Unknown        great uncle at 39, great aunt dx at 83, GM age 56  . Colon polyps Unknown        2 sisters dx at age 8 and 43  . Arthritis Sister   . Hypertension Sister   . Diabetes Sister   .  Colon polyps Sister   . Colon cancer Maternal Grandmother   . Ovarian cancer Maternal Grandmother   . Cirrhosis Maternal Grandfather   . Heart disease Paternal Grandmother   . Cervical cancer Sister   . Other Sister        uterine fibroids  . Hypertension Sister   . Diabetes Sister   . Colon polyps Sister   . Colon polyps Brother   . Breast cancer Neg Hx   . Stroke Neg Hx      Social History   Socioeconomic History  . Marital status: Single    Spouse name: Not on file  . Number of children: 2  . Years of education: Not on file  . Highest education level: Not on file  Occupational History  . Occupation: None  Social Needs  . Financial resource strain: Not on file  . Food insecurity:    Worry: Not on file    Inability: Not on file  . Transportation needs:    Medical: Not on file    Non-medical: Not on file  Tobacco Use  . Smoking status: Never Smoker  . Smokeless tobacco: Never Used  Substance and Sexual Activity  . Alcohol use: Yes    Comment: glass of wine  once a month  . Drug use: No  . Sexual activity: Yes    Birth control/protection: Other-see comments    Comment: BTL  Lifestyle  . Physical activity:    Days per week: Not on file    Minutes per session: Not on file  . Stress: Not on file  Relationships  . Social connections:    Talks on phone: Not on file    Gets together: Not on file    Attends religious service: Not on file    Active member of club or organization: Not on file    Attends meetings of clubs or organizations: Not on file    Relationship status: Not on file  . Intimate partner violence:    Fear of current or ex partner: Not on file    Emotionally abused: Not on file    Physically abused: Not on file    Forced sexual activity: Not on file  Other Topics Concern  . Not on file  Social History Narrative   Regular Exercise- yes 3/week   Caffeine- no   Health Maintenance  Topic Date Due  . Hepatitis C Screening  14-Apr-1961  .  TETANUS/TDAP  05/01/2016  . INFLUENZA VACCINE  11/29/2017  . MAMMOGRAM  07/04/2019  . PAP SMEAR  12/16/2019  . COLONOSCOPY  09/05/2021  . HIV Screening  Completed       Review of Systems Pertinent items are noted in HPI.   Objective:  Blood pressure 133/74, pulse 86, height 5\' 7"  (1.702 m), weight 238 lb (108 kg), last menstrual period 09/18/2017.     GENERAL: Well-developed, well-nourished female in no acute distress.  HEENT: Normocephalic, atraumatic. Sclerae anicteric.  NECK: Supple. Normal thyroid.  LUNGS: Clear to auscultation bilaterally.  HEART: Regular rate and rhythm. BREASTS: Symmetric in size. No palpable masses or lymphadenopathy, skin changes, or nipple drainage. ABDOMEN: Soft, nontender, nondistended. No organomegaly. PELVIC: Normal external female genitalia. Vagina is pink and rugated.  Normal discharge. Normal appearing cervix. Uterus is normal in size.  No adnexal mass or tenderness. EXTREMITIES: No cyanosis, clubbing, or edema, 2+ distal pulses.    Assessment:    Healthy female exam.      Plan:    pap smear collected Patient will be contacted with abnormal results Remind patient to keep track of menstrual calendar See After Visit Summary for Counseling Recommendations

## 2017-12-01 ENCOUNTER — Encounter: Payer: Self-pay | Admitting: Obstetrics and Gynecology

## 2017-12-03 LAB — CYTOLOGY - PAP
Diagnosis: NEGATIVE
HPV: NOT DETECTED

## 2017-12-11 ENCOUNTER — Telehealth: Payer: Self-pay | Admitting: Family Medicine

## 2017-12-11 NOTE — Telephone Encounter (Signed)
Called pt. To reschedule appt. On 02/26/18 Pt. Was unable to reschedule at time of call and will call back

## 2017-12-14 DIAGNOSIS — M25612 Stiffness of left shoulder, not elsewhere classified: Secondary | ICD-10-CM | POA: Diagnosis not present

## 2017-12-14 DIAGNOSIS — M75122 Complete rotator cuff tear or rupture of left shoulder, not specified as traumatic: Secondary | ICD-10-CM | POA: Diagnosis not present

## 2017-12-14 DIAGNOSIS — M25512 Pain in left shoulder: Secondary | ICD-10-CM | POA: Diagnosis not present

## 2017-12-14 DIAGNOSIS — M6281 Muscle weakness (generalized): Secondary | ICD-10-CM | POA: Diagnosis not present

## 2017-12-20 DIAGNOSIS — M25512 Pain in left shoulder: Secondary | ICD-10-CM | POA: Diagnosis not present

## 2018-01-18 ENCOUNTER — Other Ambulatory Visit: Payer: Self-pay | Admitting: Family Medicine

## 2018-01-18 NOTE — Telephone Encounter (Signed)
Synthroid 248mcg refill Last Refill:06/04/17 # 90 Last OV: 06/04/17 PCP: Pamella Pert Pharmacy:[facility] [phone]

## 2018-01-29 DIAGNOSIS — M25512 Pain in left shoulder: Secondary | ICD-10-CM | POA: Diagnosis not present

## 2018-01-29 DIAGNOSIS — M7581 Other shoulder lesions, right shoulder: Secondary | ICD-10-CM | POA: Diagnosis not present

## 2018-02-19 ENCOUNTER — Ambulatory Visit (INDEPENDENT_AMBULATORY_CARE_PROVIDER_SITE_OTHER): Payer: PPO | Admitting: Family Medicine

## 2018-02-19 ENCOUNTER — Encounter: Payer: Self-pay | Admitting: Family Medicine

## 2018-02-19 VITALS — BP 132/81 | HR 83 | Temp 97.5°F | Ht 66.0 in | Wt 241.4 lb

## 2018-02-19 DIAGNOSIS — F4322 Adjustment disorder with anxiety: Secondary | ICD-10-CM

## 2018-02-19 DIAGNOSIS — I10 Essential (primary) hypertension: Secondary | ICD-10-CM

## 2018-02-19 DIAGNOSIS — Z1159 Encounter for screening for other viral diseases: Secondary | ICD-10-CM | POA: Diagnosis not present

## 2018-02-19 DIAGNOSIS — Z23 Encounter for immunization: Secondary | ICD-10-CM

## 2018-02-19 DIAGNOSIS — R7303 Prediabetes: Secondary | ICD-10-CM

## 2018-02-19 DIAGNOSIS — E039 Hypothyroidism, unspecified: Secondary | ICD-10-CM

## 2018-02-19 LAB — CBC
Hematocrit: 41.6 % (ref 34.0–46.6)
Hemoglobin: 13.4 g/dL (ref 11.1–15.9)
MCH: 27.2 pg (ref 26.6–33.0)
MCHC: 32.2 g/dL (ref 31.5–35.7)
MCV: 84 fL (ref 79–97)
Platelets: 221 10*3/uL (ref 150–450)
RBC: 4.93 x10E6/uL (ref 3.77–5.28)
RDW: 13.9 % (ref 12.3–15.4)
WBC: 5.1 10*3/uL (ref 3.4–10.8)

## 2018-02-19 LAB — POCT GLYCOSYLATED HEMOGLOBIN (HGB A1C): Hemoglobin A1C: 6.1 % — AB (ref 4.0–5.6)

## 2018-02-19 MED ORDER — ALPRAZOLAM 0.25 MG PO TABS: 0.2500 mg | ORAL_TABLET | Freq: Every day | ORAL | 0 refills | Status: DC | PRN

## 2018-02-19 MED ORDER — SERTRALINE HCL 25 MG PO TABS: 25.0000 mg | ORAL_TABLET | Freq: Every day | ORAL | 3 refills | Status: DC

## 2018-02-19 NOTE — Patient Instructions (Addendum)
Start with sertraline 1/2 tab once a day, increase 1 tab daily.    If you have lab work done today you will be contacted with your lab results within the next 2 weeks.  If you have not heard from [place] then please contact us. The fastest way to get your results is to register for My Chart.   IF you received an x-ray today, you will receive an invoice from [facility]. Please contact [facility] at [phone] with questions or concerns regarding your invoice.   IF you received labwork today, you will receive an invoice from [facility]. Please contact [facility] at [phone] with questions or concerns regarding your invoice.   Our billing staff will not be able to assist you with questions regarding bills from these companies.  You will be contacted with the lab results as soon as they are available. The fastest way to get your results is to activate your My Chart account. Instructions are located on the last page of this paperwork. If you have not heard from [place] regarding the results in 2 weeks, please contact this office.     Prediabetes Eating Plan Prediabetes-also called impaired glucose tolerance or impaired fasting glucose-is a condition that causes blood sugar (blood glucose) levels to be higher than normal. Following a healthy diet can help to keep prediabetes under control. It can also help to lower the risk of type 2 diabetes and heart disease, which are increased in people who have prediabetes. Along with regular exercise, a healthy diet:  Promotes weight loss.  Helps to control blood sugar levels.  Helps to improve the way that the body uses insulin.  What do I need to know about this eating plan?  Use the glycemic index (GI) to plan your meals. The index tells you how quickly a food will raise your blood sugar. Choose low-GI foods. These foods take a longer time to raise blood sugar.  Pay close attention to the amount of carbohydrates in the food that you eat.  Carbohydrates increase blood sugar levels.  Keep track of how many calories you take in. Eating the right amount of calories will help you to achieve a healthy weight. Losing about 7 percent of your starting weight can help to prevent type 2 diabetes.  You may want to follow a Mediterranean diet. This diet includes a lot of vegetables, lean meats or fish, whole grains, fruits, and healthy oils and fats. What foods can I eat? Grains Whole grains, such as whole-wheat or whole-grain breads, crackers, cereals, and pasta. Unsweetened oatmeal. Bulgur. Barley. Quinoa. Brown rice. Corn or whole-wheat flour tortillas or taco shells. Vegetables Lettuce. Spinach. Peas. Beets. Cauliflower. Cabbage. Broccoli. Carrots. Tomatoes. Squash. Eggplant. Herbs. Peppers. Onions. Cucumbers. Brussels sprouts. Fruits Berries. Bananas. Apples. Oranges. Grapes. Papaya. Mango. Pomegranate. Kiwi. Grapefruit. Cherries. Meats and Other Protein Sources Seafood. Lean meats, such as chicken and [place] or lean cuts of pork and beef. Tofu. Eggs. Nuts. Beans. Dairy Low-fat or fat-free dairy products, such as yogurt, cottage cheese, and cheese. Beverages Water. Tea. Coffee. Sugar-free or diet soda. Seltzer water. Milk. Milk alternatives, such as soy or almond milk. Condiments Mustard. Relish. Low-fat, low-sugar ketchup. Low-fat, low-sugar barbecue sauce. Low-fat or fat-free mayonnaise. Sweets and Desserts Sugar-free or low-fat pudding. Sugar-free or low-fat ice cream and other frozen treats. Fats and Oils Avocado. Walnuts. Olive oil. The items listed above may not be a complete list of recommended foods or beverages. Contact your dietitian for more options. What foods are not recommended?  Grains Refined white flour and flour products, such as bread, pasta, snack foods, and cereals. Beverages Sweetened drinks, such as sweet iced tea and soda. Sweets and Desserts Baked goods, such as cake, cupcakes, pastries, cookies, and  cheesecake. The items listed above may not be a complete list of foods and beverages to avoid. Contact your dietitian for more information. This information is not intended to replace advice given to you by your health care provider. Make sure you discuss any questions you have with your health care provider. Document Released: 09/01/2014 Document Revised: 09/23/2015 Document Reviewed: 05/13/2014 Elsevier Interactive Patient Education  2017 [employer].

## 2018-02-19 NOTE — Progress Notes (Signed)
10/22/201911:33 AM  Kathleen Mcfarland [birth year], 57 y.o. female [id number]  Chief Complaint  Patient presents with  . Anxiety  . Hypertension    f/u   . chronic condition    HPI:   Patient is a 57 y.o. female with past medical history significant for HTN, hypothyroidism, prediabeste who presents today for routine followup  Her sister died Feb 16, 2017 Since then she has been having worsening anxiety Financially strapped, not able to keep up with PT Has lots going on life wise Has done well with xanax in the past Was having nausea and diarrhea, has resolved, thinks it was her nerves Otherwise has started JJ green smoothies, trying to loose weight Cant exercise as much anymore due to OA knees, and shoulder surgeries  GAD 7 : Generalized Anxiety Score 02/19/2018 11/30/2017  Nervous, Anxious, on Edge 2 2  Control/stop worrying 2 0  Worry too much - different things 3 0  Trouble relaxing 3 2  Restless 0 1  Easily annoyed or irritable 1 2  Afraid - awful might happen 0 2  Total GAD 7 Score 11 9  Anxiety Difficulty Somewhat difficult -     Lab Results  Component Value Date   HGBA1C 5.8 (H) 06/04/2017   HGBA1C 6.0 (H) 01/30/2014   Lab Results  Component Value Date   LDLCALC 86 06/04/2017   CREATININE 0.77 08/21/2017   Lab Results  Component Value Date   TSH 1.480 06/04/2017    Fall Risk  02/19/2018 02/19/2018 08/21/2017 06/04/2017 03/04/2015  Falls in the past year? No No No No No  Number falls in past yr: - - - - -  Injury with Fall? - - - - -  Risk for fall due to : - - - - -     Depression screen [facility] 2/9 02/19/2018 11/30/2017 08/21/2017  Decreased Interest 0 2 0  Down, Depressed, Hopeless 0 0 0  PHQ - 2 Score 0 2 0  Altered sleeping - 0 -  Tired, decreased energy - 2 -  Change in appetite - 0 -  Feeling bad or failure about yourself  - 0 -  Trouble concentrating - 0 -  Moving slowly or fidgety/restless - 0 -  Suicidal thoughts - 0 -  PHQ-9 Score - 4 -     Allergies  Allergen Reactions  . Penicillins Hives  . Felodipine Nausea And Vomiting    Prior to Admission medications   Medication Sig Start Date End Date Taking? Authorizing Provider  Ascorbic Acid (VITAMIN C) 1000 MG tablet Take 1,000 mg by mouth daily.   Yes [provider]  cetirizine (ZYRTEC) 10 MG tablet Take 1 tablet (10 mg total) by mouth daily. 08/21/17  Yes Rutherford Guys, MD  cholecalciferol (VITAMIN D) 1000 UNITS tablet Take 1 tablet (1,000 Units total) by mouth daily. For bone health 01/07/14  Yes Lindell Spar I, NP  Omega-3 1000 MG CAPS Take by mouth.   Yes [provider]  SYNTHROID 200 MCG tablet TAKE 1 TABLET BY MOUTH ONCE DAILY BEFORE BREAKFAST 01/18/18  Yes Rutherford Guys, MD  triamterene-hydrochlorothiazide (MAXZIDE-25) 37.5-25 MG tablet TAKE 1 TABLET BY MOUTH ONCE DAILY FOR BLOOD PRESSURE 10/23/17  Yes Rutherford Guys, MD  vitamin B-12 (CYANOCOBALAMIN) 500 MCG tablet Take 500 mcg by mouth daily.   Yes [provider]    Past Medical History:  Diagnosis Date  . Allergic rhinitis   . Anxiety   . Back pain   .  Cataract   . Complication of anesthesia    hard to wake up  . Depression   . Hypertension   . Hypothyroidism   . Osteoarthritis 2012   bilateral knees  . Osteoarthritis of knee   . Osteoporosis     Past Surgical History:  Procedure Laterality Date  . colonscopy     . DILATION AND CURETTAGE OF UTERUS    . ROTATOR CUFF REPAIR    . SHOULDER ARTHROSCOPY  04/13/2011   Procedure: ARTHROSCOPY SHOULDER;  Surgeon: Ninetta Lights, MD;  Location: [facility];  Service: Orthopedics;  Laterality: Right;  Right Shoulder Arthroscopy with Acrmioploasty, Arhtroscopic rotator cuff repair and Microfracture humeral head  . SHOULDER ARTHROSCOPY WITH ROTATOR CUFF REPAIR Right 07/04/2012   Procedure: SHOULDER ARTHROSCOPY WITH ROTATOR CUFF REPAIR;  Surgeon: Ninetta Lights, MD;  Location: [facility];  Service:  Orthopedics;  Laterality: Right;  RIGHT SHOULDER ARTHROSCOPY WITH ARTHROSCOPIC ROTATOR CUFF REPAIR, LYSIS OF ADHESIONS  . TUBAL LIGATION      Social History   Tobacco Use  . Smoking status: Never Smoker  . Smokeless tobacco: Never Used  Substance Use Topics  . Alcohol use: Yes    Comment: glass of wine once a month    Family History  Problem Relation Age of Onset  . Coronary artery disease Father 29       heart disease diagnoised  . Hypertension Father   . Heart disease Father   . Colon cancer Father   . Diabetes Father   . Hyperlipidemia Father   . Diabetes Mother   . Hypertension Mother   . Colon cancer Unknown        great uncle at 38, great aunt dx at 6, GM age 41  . Colon polyps Unknown        2 sisters dx at age 71 and 49  . Arthritis Sister   . Hypertension Sister   . Diabetes Sister   . Colon polyps Sister   . Colon cancer Maternal Grandmother   . Ovarian cancer Maternal Grandmother   . Cirrhosis Maternal Grandfather   . Heart disease Paternal Grandmother   . Cervical cancer Sister   . Other Sister        uterine fibroids  . Hypertension Sister   . Diabetes Sister   . Colon polyps Sister   . Colon polyps Brother   . Breast cancer Neg Hx   . Stroke Neg Hx     Review of Systems  Constitutional: Negative for chills and fever.  Respiratory: Negative for cough and shortness of breath.   Cardiovascular: Negative for chest pain, palpitations and leg swelling.  Gastrointestinal: Negative for abdominal pain, nausea and vomiting.  Musculoskeletal: Positive for joint pain.  Psychiatric/Behavioral: Positive for depression. The patient is nervous/anxious and has insomnia.      OBJECTIVE:  Blood pressure 132/81, pulse 83, temperature (!) 97.5 F (36.4 C), temperature source Oral, height 5' 6"  (1.676 m), weight 241 lb 6.4 oz (109.5 kg), SpO2 99 %. Body mass index is 38.96 kg/m.   Wt Readings from Last 3 Encounters:  02/19/18 241 lb 6.4 oz (109.5 kg)    11/30/17 238 lb (108 kg)  08/21/17 237 lb (107.5 kg)    Physical Exam  Constitutional: She is oriented to person, place, and time. She appears well-developed and well-nourished.  HENT:  Head: Normocephalic and atraumatic.  Mouth/Throat: Mucous membranes are normal.  Eyes: Pupils are equal, round, and reactive to light.  Conjunctivae and EOM are normal. No scleral icterus.  Neck: Neck supple.  Pulmonary/Chest: Effort normal.  Neurological: She is alert and oriented to person, place, and time.  Skin: Skin is warm and dry.  Psychiatric: She has a normal mood and affect.  Nursing note and vitals reviewed.   Results for orders placed or performed in visit on 02/19/18 (from the past 24 hour(s))  POCT glycosylated hemoglobin (Hb A1C)     Status: Abnormal   Collection Time: 02/19/18 11:38 AM  Result Value Ref Range   Hemoglobin A1C 6.1 (A) 4.0 - 5.6 %   HbA1c POC (<> result, manual entry)     HbA1c, POC (prediabetic range)     HbA1c, POC (controlled diabetic range)       ASSESSMENT and PLAN  1. Pre-diabetes Stable. Continue with healthy life style changes - POCT glycosylated hemoglobin (Hb A1C)  2. HYPERTENSION, BENIGN ESSENTIAL Controlled. Continue current regime.  - CMP14+EGFR - Lipid panel - CBC  3. Hypothyroidism (acquired) Checking labs today, medications will be adjusted as needed.  - TSH  4. Adjustment disorder with anxious mood Uncontrolled. Financially limited. Discussed options. Starting sertraline. Refilling xanax prn. Discussed meds r/se/b. Patient will communicate with me via mychart for further adjustment of medications.  5. Encounter for hepatitis C screening test for low risk patient - HCV Ab w/Rflx to Verification  Other orders - Omega-3 1000 MG CAPS; Take by mouth. - Flu Vaccine QUAD 36+ mos IM - sertraline (ZOLOFT) 25 MG tablet; Take 1 tablet (25 mg total) by mouth daily. - ALPRAZolam (XANAX) 0.25 MG tablet; Take 1 tablet (0.25 mg total) by mouth  daily as needed for anxiety.  Return in about 6 months (around 08/21/2018).    Rutherford Guys, MD Primary Care at [facility] [place], [place] [address] Ph.  [phone] Fax [phone]

## 2018-02-20 LAB — CMP14+EGFR
ALT: 24 IU/L (ref 0–32)
AST: 23 IU/L (ref 0–40)
Albumin/Globulin Ratio: 1.7 (ref 1.2–2.2)
Albumin: 3.9 g/dL (ref 3.5–5.5)
Alkaline Phosphatase: 47 IU/L (ref 39–117)
BUN/Creatinine Ratio: 18 (ref 9–23)
BUN: 13 mg/dL (ref 6–24)
Bilirubin Total: 0.3 mg/dL (ref 0.0–1.2)
CO2: 19 mmol/L — ABNORMAL LOW (ref 20–29)
Calcium: 9.5 mg/dL (ref 8.7–10.2)
Chloride: 105 mmol/L (ref 96–106)
Creatinine, Ser: 0.73 mg/dL (ref 0.57–1.00)
GFR calc Af Amer: 106 mL/min/{1.73_m2} (ref >59)
GFR calc non Af Amer: 92 mL/min/{1.73_m2} (ref >59)
Globulin, Total: 2.3 g/dL (ref 1.5–4.5)
Glucose: 92 mg/dL (ref 65–99)
Potassium: 4.1 mmol/L (ref 3.5–5.2)
Sodium: 145 mmol/L — ABNORMAL HIGH (ref 134–144)
Total Protein: 6.2 g/dL (ref 6.0–8.5)

## 2018-02-20 LAB — HCV INTERPRETATION

## 2018-02-20 LAB — LIPID PANEL
Chol/HDL Ratio: 3.7 ratio (ref 0.0–4.4)
Cholesterol, Total: 178 mg/dL (ref 100–199)
HDL: 48 mg/dL (ref >39)
LDL Calculated: 116 mg/dL — ABNORMAL HIGH (ref 0–99)
Triglycerides: 70 mg/dL (ref 0–149)
VLDL Cholesterol Cal: 14 mg/dL (ref 5–40)

## 2018-02-20 LAB — HCV AB W/RFLX TO VERIFICATION: HCV Ab: <0.1 s/co ratio (ref 0.0–0.9)

## 2018-02-20 LAB — TSH: TSH: 1.5 u[IU]/mL (ref 0.450–4.500)

## 2018-02-26 ENCOUNTER — Ambulatory Visit: Payer: PPO | Admitting: Family Medicine

## 2018-03-29 ENCOUNTER — Telehealth: Payer: Self-pay | Admitting: Family Medicine

## 2018-03-29 NOTE — Telephone Encounter (Signed)
MyChart message sent to patient about rescheduling their appt on 08/22/18

## 2018-04-22 ENCOUNTER — Other Ambulatory Visit: Payer: Self-pay | Admitting: Family Medicine

## 2018-04-22 DIAGNOSIS — I10 Essential (primary) hypertension: Secondary | ICD-10-CM

## 2018-05-30 ENCOUNTER — Other Ambulatory Visit: Payer: Self-pay | Admitting: Family Medicine

## 2018-05-30 DIAGNOSIS — Z1231 Encounter for screening mammogram for malignant neoplasm of breast: Secondary | ICD-10-CM

## 2018-05-31 ENCOUNTER — Ambulatory Visit: Payer: PPO | Admitting: Family Medicine

## 2018-06-21 ENCOUNTER — Ambulatory Visit (INDEPENDENT_AMBULATORY_CARE_PROVIDER_SITE_OTHER): Payer: PPO | Admitting: Orthopedic Surgery

## 2018-06-26 ENCOUNTER — Ambulatory Visit (INDEPENDENT_AMBULATORY_CARE_PROVIDER_SITE_OTHER): Payer: PPO

## 2018-06-26 ENCOUNTER — Encounter (INDEPENDENT_AMBULATORY_CARE_PROVIDER_SITE_OTHER): Payer: Self-pay | Admitting: Orthopedic Surgery

## 2018-06-26 ENCOUNTER — Ambulatory Visit (INDEPENDENT_AMBULATORY_CARE_PROVIDER_SITE_OTHER): Payer: PPO | Admitting: Orthopedic Surgery

## 2018-06-26 DIAGNOSIS — M25561 Pain in right knee: Secondary | ICD-10-CM | POA: Diagnosis not present

## 2018-06-26 DIAGNOSIS — M1711 Unilateral primary osteoarthritis, right knee: Secondary | ICD-10-CM

## 2018-06-26 MED ORDER — BACLOFEN 10 MG PO TABS: ORAL_TABLET | ORAL | 0 refills | Status: DC

## 2018-06-28 ENCOUNTER — Encounter (INDEPENDENT_AMBULATORY_CARE_PROVIDER_SITE_OTHER): Payer: Self-pay | Admitting: Orthopedic Surgery

## 2018-06-28 ENCOUNTER — Telehealth (INDEPENDENT_AMBULATORY_CARE_PROVIDER_SITE_OTHER): Payer: Self-pay | Admitting: Orthopedic Surgery

## 2018-06-28 DIAGNOSIS — M25561 Pain in right knee: Secondary | ICD-10-CM | POA: Diagnosis not present

## 2018-06-28 DIAGNOSIS — M1711 Unilateral primary osteoarthritis, right knee: Secondary | ICD-10-CM | POA: Diagnosis not present

## 2018-06-28 MED ORDER — LIDOCAINE HCL 1 % IJ SOLN
5.0000 mL | INTRAMUSCULAR | Status: AC | PRN
  Administered 2018-06-28: 5 mL

## 2018-06-28 MED ORDER — BUPIVACAINE HCL 0.25 % IJ SOLN
4.0000 mL | INTRAMUSCULAR | Status: AC | PRN
  Administered 2018-06-28: 4 mL via INTRA_ARTICULAR

## 2018-06-28 MED ORDER — METHYLPREDNISOLONE ACETATE 40 MG/ML IJ SUSP
40.0000 mg | INTRAMUSCULAR | Status: AC | PRN
  Administered 2018-06-28: 40 mg via INTRA_ARTICULAR

## 2018-06-28 NOTE — Progress Notes (Signed)
Office Visit Note   Patient: Kathleen Mcfarland           Date of Birth: 10-Nov-1960           MRN: [id number] Visit Date: 06/26/2018 Requested by: Rutherford Guys, MD [address] [place], [place] [address] PCP: Rutherford Guys, MD  Subjective: Chief Complaint  Patient presents with  . Right Knee - Pain    HPI: Kathleen Mcfarland is a patient with right knee pain.  She was last seen in 2018.  She is been ambulating with a cane.  She reports weakness and pain in the knee.  Along with stiffness.  She is had pain for several weeks.  Denies any interval history of injury.  Since have seen her she is actually had some rotator cuff surgery done in May 2019.  She states the knee swelled up last week.  She has taken baclofen and Robaxin in the past.              ROS: All systems reviewed are negative as they relate to the chief complaint within the history of present illness.  Patient denies  fevers or chills.   Assessment & Plan: Visit Diagnoses:  1. Right knee pain, unspecified chronicity     Plan: Impression is right knee arthritis which is worsening.  She does have a bit of a flexion contracture on that right-hand side.  Medial compartment arthritis is the worst.  Plan is injection in the knee today.  We will see how she does with that and then she may potentially be heading for knee replacement sometime in the future.  Baclofen is prescribed.  I will see her back as needed.  Follow-Up Instructions: Return if symptoms worsen or fail to improve.   Orders:  Orders Placed This Encounter  Procedures  . XR KNEE 3 VIEW RIGHT   Meds ordered this encounter  Medications  . baclofen (LIORESAL) 10 MG tablet    Sig: 1 po bid prn    Dispense:  30 each    Refill:  0      Procedures: Large Joint Inj: R knee on 06/28/2018 5:57 PM Indications: diagnostic evaluation, joint swelling and pain Details: 18 G 1.5 in needle, superolateral approach  Arthrogram: No  Medications: 5 mL lidocaine 1 %; 40 mg  methylPREDNISolone acetate 40 MG/ML; 4 mL bupivacaine 0.25 % Outcome: tolerated well, no immediate complications Procedure, treatment alternatives, risks and benefits explained, specific risks discussed. Consent was given by the patient. Immediately prior to procedure a time out was called to verify the correct patient, procedure, equipment, support staff and site/side marked as required. Patient was prepped and draped in the usual sterile fashion.       Clinical Data: No additional findings.  Objective: Vital Signs: There were no vitals taken for this visit.  Physical Exam:   Constitutional: Patient appears well-developed HEENT:  Head: Normocephalic Eyes:EOM are normal Neck: Normal range of motion Cardiovascular: Normal rate Pulmonary/chest: Effort normal Neurologic: Patient is alert Skin: Skin is warm Psychiatric: Patient has normal mood and affect    Ortho Exam: Ortho exam demonstrates full active and passive range of motion of the ankles and hips.  Right knee has about a 10 degree flexion contracture but flexion past 90 to about 100 degrees.  No effusion is present in the knee.  Collateral and cruciate ligaments are stable.  No masses lymphadenopathy or skin changes noted in that right knee region.  Extensor mechanism is intact  Specialty  Comments:  No specialty comments available.  Imaging: No results found.   PMFS History: Patient Active Problem List   Diagnosis Date Noted  . Pre-diabetes 06/04/2017  . Menopausal syndrome (hot flashes) 06/04/2017  . History of colonic polyps 06/04/2017  . Abnormal perimenopausal bleeding 03/04/2015  . Suicidal ideation   . Back muscle spasm 01/30/2014  . Severe recurrent major depressive disorder with psychotic features ([place]) 01/03/2014  . Depression 01/02/2014  . Urinary incontinence 07/21/2010  . MENSTRUAL DISORDER 01/12/2009  . HYPERTENSION, BENIGN ESSENTIAL 07/31/2006  . Hypothyroidism (acquired) 06/23/2006  . Seasonal  allergies 06/23/2006  . Osteoarthrosis involving lower leg 06/23/2006   Past Medical History:  Diagnosis Date  . Allergic rhinitis   . Anxiety   . Back pain   . Cataract   . Complication of anesthesia    hard to wake up  . Depression   . Hypertension   . Hypothyroidism   . Osteoarthritis 2012   bilateral knees  . Osteoarthritis of knee   . Osteoporosis     Family History  Problem Relation Age of Onset  . Coronary artery disease Father 25       heart disease diagnoised  . Hypertension Father   . Heart disease Father   . Colon cancer Father   . Diabetes Father   . Hyperlipidemia Father   . Diabetes Mother   . Hypertension Mother   . Colon cancer Unknown        great uncle at 61, great aunt dx at 31, GM age 78  . Colon polyps Unknown        2 sisters dx at age 63 and 79  . Arthritis Sister   . Hypertension Sister   . Diabetes Sister   . Colon polyps Sister   . Colon cancer Maternal Grandmother   . Ovarian cancer Maternal Grandmother   . Cirrhosis Maternal Grandfather   . Heart disease Paternal Grandmother   . Cervical cancer Sister   . Other Sister        uterine fibroids  . Hypertension Sister   . Diabetes Sister   . Colon polyps Sister   . Colon polyps Brother   . Breast cancer Neg Hx   . Stroke Neg Hx     Past Surgical History:  Procedure Laterality Date  . colonscopy     . DILATION AND CURETTAGE OF UTERUS    . ROTATOR CUFF REPAIR    . SHOULDER ARTHROSCOPY  04/13/2011   Procedure: ARTHROSCOPY SHOULDER;  Surgeon: Ninetta Lights, MD;  Location: [facility];  Service: Orthopedics;  Laterality: Right;  Right Shoulder Arthroscopy with Acrmioploasty, Arhtroscopic rotator cuff repair and Microfracture humeral head  . SHOULDER ARTHROSCOPY WITH ROTATOR CUFF REPAIR Right 07/04/2012   Procedure: SHOULDER ARTHROSCOPY WITH ROTATOR CUFF REPAIR;  Surgeon: Ninetta Lights, MD;  Location: [facility];  Service: Orthopedics;  Laterality: Right;   RIGHT SHOULDER ARTHROSCOPY WITH ARTHROSCOPIC ROTATOR CUFF REPAIR, LYSIS OF ADHESIONS  . TUBAL LIGATION     Social History   Occupational History  . Occupation: None  Tobacco Use  . Smoking status: Never Smoker  . Smokeless tobacco: Never Used  Substance and Sexual Activity  . Alcohol use: Yes    Comment: glass of wine once a month  . Drug use: No  . Sexual activity: Yes    Birth control/protection: Other-see comments    Comment: BTL

## 2018-06-28 NOTE — Telephone Encounter (Signed)
Patient called stating that she would like to talk to today.  She was in the other day and received an injection.  CB#[phone].  Thank you

## 2018-06-28 NOTE — Telephone Encounter (Signed)
IC appt scheduled.  

## 2018-06-30 DIAGNOSIS — H1013 Acute atopic conjunctivitis, bilateral: Secondary | ICD-10-CM | POA: Diagnosis not present

## 2018-06-30 DIAGNOSIS — H40033 Anatomical narrow angle, bilateral: Secondary | ICD-10-CM | POA: Diagnosis not present

## 2018-07-01 ENCOUNTER — Encounter (INDEPENDENT_AMBULATORY_CARE_PROVIDER_SITE_OTHER): Payer: Self-pay | Admitting: Orthopedic Surgery

## 2018-07-01 ENCOUNTER — Ambulatory Visit (INDEPENDENT_AMBULATORY_CARE_PROVIDER_SITE_OTHER): Payer: PPO | Admitting: Orthopedic Surgery

## 2018-07-01 DIAGNOSIS — M1711 Unilateral primary osteoarthritis, right knee: Secondary | ICD-10-CM

## 2018-07-01 MED ORDER — BUPIVACAINE HCL 0.25 % IJ SOLN
4.0000 mL | INTRAMUSCULAR | Status: AC | PRN
  Administered 2018-07-01: 4 mL via INTRA_ARTICULAR

## 2018-07-01 MED ORDER — METHYLPREDNISOLONE ACETATE 40 MG/ML IJ SUSP
40.0000 mg | INTRAMUSCULAR | Status: AC | PRN
  Administered 2018-07-01: 40 mg via INTRA_ARTICULAR

## 2018-07-01 MED ORDER — LIDOCAINE HCL 1 % IJ SOLN
5.0000 mL | INTRAMUSCULAR | Status: AC | PRN
  Administered 2018-07-01: 5 mL

## 2018-07-01 NOTE — Progress Notes (Signed)
   Procedure Note  Patient: Kathleen Mcfarland             Date of Birth: [birth year]           MRN: [id number]             Visit Date: 07/01/2018  Procedures: Visit Diagnoses: Unilateral primary osteoarthritis, right knee  Large Joint Inj: R knee on 07/01/2018 4:22 PM Indications: diagnostic evaluation, joint swelling and pain Details: 18 G 1.5 in needle, superolateral approach  Arthrogram: No  Medications: 5 mL lidocaine 1 %; 40 mg methylPREDNISolone acetate 40 MG/ML; 4 mL bupivacaine 0.25 % Outcome: tolerated well, no immediate complications Procedure, treatment alternatives, risks and benefits explained, specific risks discussed. Consent was given by the patient. Immediately prior to procedure a time out was called to verify the correct patient, procedure, equipment, support staff and site/side marked as required. Patient was prepped and draped in the usual sterile fashion.

## 2018-07-01 NOTE — Telephone Encounter (Signed)
Patient called my voice mail, LM to call her, IC NA, LM for her to call me back.

## 2018-07-08 ENCOUNTER — Ambulatory Visit
Admission: RE | Admit: 2018-07-08 | Discharge: 2018-07-08 | Disposition: A | Discharge status: Home / Self Care | Payer: PPO | Source: Ambulatory Visit | Attending: Family Medicine | Admitting: Family Medicine

## 2018-07-08 DIAGNOSIS — Z1231 Encounter for screening mammogram for malignant neoplasm of breast: Secondary | ICD-10-CM

## 2018-07-18 ENCOUNTER — Telehealth: Payer: Self-pay | Admitting: Family Medicine

## 2018-07-18 NOTE — Telephone Encounter (Signed)
°  Relation to pt: self  Call back number: [phone] Pharmacy: [facility], [place] [phone] (Phone) [phone] (Fax)     Reason for call:  Patient states pharmacy sent in request for SYNTHROID 200 MCG tablet on Friday 07/12/2018 due to pharmacy business hours changing patient would like to be notified when Rx is sent in, please advise

## 2018-07-19 NOTE — Telephone Encounter (Signed)
Medication has been sent.  

## 2018-07-20 ENCOUNTER — Other Ambulatory Visit: Payer: Self-pay | Admitting: *Deleted

## 2018-07-20 DIAGNOSIS — E039 Hypothyroidism, unspecified: Secondary | ICD-10-CM

## 2018-07-20 DIAGNOSIS — R7303 Prediabetes: Secondary | ICD-10-CM

## 2018-07-20 DIAGNOSIS — I10 Essential (primary) hypertension: Secondary | ICD-10-CM

## 2018-07-25 ENCOUNTER — Other Ambulatory Visit: Payer: Self-pay

## 2018-07-25 ENCOUNTER — Telehealth (INDEPENDENT_AMBULATORY_CARE_PROVIDER_SITE_OTHER): Payer: PPO | Admitting: Family Medicine

## 2018-07-25 DIAGNOSIS — F4322 Adjustment disorder with anxiety: Secondary | ICD-10-CM | POA: Diagnosis not present

## 2018-07-25 DIAGNOSIS — J302 Other seasonal allergic rhinitis: Secondary | ICD-10-CM

## 2018-07-25 DIAGNOSIS — I10 Essential (primary) hypertension: Secondary | ICD-10-CM

## 2018-07-25 DIAGNOSIS — E559 Vitamin D deficiency, unspecified: Secondary | ICD-10-CM

## 2018-07-25 DIAGNOSIS — N951 Menopausal and female climacteric states: Secondary | ICD-10-CM

## 2018-07-25 DIAGNOSIS — Z1159 Encounter for screening for other viral diseases: Secondary | ICD-10-CM

## 2018-07-25 MED ORDER — TRIAMTERENE-HCTZ 37.5-25 MG PO TABS: 1.0000 | ORAL_TABLET | Freq: Every day | ORAL | 0 refills | Status: DC

## 2018-07-25 MED ORDER — FLUTICASONE PROPIONATE 50 MCG/ACT NA SUSP: 1.0000 | Freq: Two times a day (BID) | NASAL | 6 refills | Status: DC

## 2018-07-25 MED ORDER — ALPRAZOLAM 0.25 MG PO TABS: 0.2500 mg | ORAL_TABLET | Freq: Every day | ORAL | 0 refills | Status: DC | PRN

## 2018-07-25 NOTE — Progress Notes (Signed)
Virtual Visit via telephone Note  I connected with patient on 07/25/18 at 227pm by telephone and verified that I am speaking with the correct person using two identifiers. Kathleen Mcfarland is currently located at home and patient is currently with her during visit. The provider, Rutherford Guys, MD is located in their office at time of visit.  I discussed the limitations, risks, security and privacy concerns of performing an evaluation and management service by telephone and the availability of in person appointments. I also discussed with the patient that there may be a patient responsible charge related to this service. The patient expressed understanding and agreed to proceed.  CC: anxiety  Telephone visit today for worsening anxiety  HPI  Last OV Oct 2019 At that visit started on sertraline - not taking anymore Takes lorazepam prn, requesting refill Last rx given oct Had rotator cuff surgery in may Has had multiple steroid injection to her knees She was worried about losing her taste and smell - now getting better Allergies are not well controlled But worried about the coronavirus She has not had fever, SOB Mild cough, lots of sneezing Taking zyrtec Having difficulty with coping with all her medical issues and limitations from OA Has been working on weight loss Has also started to have hot flashes and insomnia, last LMP about 3 months  Needs refills of BP medication  BP Readings from Last 3 Encounters:  02/19/18 132/81  11/30/17 133/74  08/21/17 130/60    Lab Results  Component Value Date   HGBA1C 6.1 (A) 02/19/2018   HGBA1C 5.8 (H) 06/04/2017   HGBA1C 6.0 (H) 01/30/2014   Lab Results  Component Value Date   LDLCALC 116 (H) 02/19/2018   CREATININE 0.73 02/19/2018  ?  Fall Risk  07/25/2018 02/19/2018 02/19/2018 08/21/2017 06/04/2017  Falls in the past year? 0 No No No No  Number falls in past yr: 0 - - - -  Injury with Fall? 1 - - - -  Risk for fall due to :  - - - - -     Depression screen [facility] 2/9 07/25/2018 07/25/2018 02/19/2018  Decreased Interest 0 0 0  Down, Depressed, Hopeless 2 0 0  PHQ - 2 Score 2 0 0  Altered sleeping 3 - -  Tired, decreased energy 3 - -  Change in appetite 3 - -  Feeling bad or failure about yourself  2 - -  Trouble concentrating 0 - -  Moving slowly or fidgety/restless 2 - -  Suicidal thoughts 0 - -  PHQ-9 Score 15 - -  Difficult doing work/chores Not difficult at all - -    Allergies  Allergen Reactions  . Penicillins Hives  . Felodipine Nausea And Vomiting    Prior to Admission medications   Medication Sig Start Date End Date Taking? Authorizing Provider  ALPRAZolam (XANAX) 0.25 MG tablet Take 1 tablet (0.25 mg total) by mouth daily as needed for anxiety. 02/19/18   Rutherford Guys, MD  Ascorbic Acid (VITAMIN C) 1000 MG tablet Take 1,000 mg by mouth daily.    [provider]  baclofen (LIORESAL) 10 MG tablet 1 po bid prn 06/26/18   Meredith Pel, MD  cetirizine (ZYRTEC) 10 MG tablet Take 1 tablet (10 mg total) by mouth daily. 08/21/17   Rutherford Guys, MD  cholecalciferol (VITAMIN D) 1000 UNITS tablet Take 1 tablet (1,000 Units total) by mouth daily. For bone health 01/07/14   Encarnacion Slates, NP  Omega-3 1000 MG CAPS Take by mouth.    [provider]  sertraline (ZOLOFT) 25 MG tablet Take 1 tablet (25 mg total) by mouth daily. 02/19/18   Rutherford Guys, MD  SYNTHROID 200 MCG tablet TAKE 1 TABLET BY MOUTH ONCE DAILY BEFORE BREAKFAST 07/18/18   Rutherford Guys, MD  triamterene-hydrochlorothiazide [facility]) 37.5-25 MG tablet TAKE 1 TABLET BY MOUTH ONCE DAILY FOR BLOOD PRESSURE 04/22/18   Rutherford Guys, MD  vitamin B-12 (CYANOCOBALAMIN) 500 MCG tablet Take 500 mcg by mouth daily.    [provider]    Past Medical History:  Diagnosis Date  . Allergic rhinitis   . Anxiety   . Back pain   . Cataract   . Complication of anesthesia    hard to wake up  . Depression    . Hypertension   . Hypothyroidism   . Osteoarthritis 2012   bilateral knees  . Osteoarthritis of knee   . Osteoporosis     Past Surgical History:  Procedure Laterality Date  . colonscopy     . DILATION AND CURETTAGE OF UTERUS    . ROTATOR CUFF REPAIR    . SHOULDER ARTHROSCOPY  04/13/2011   Procedure: ARTHROSCOPY SHOULDER;  Surgeon: Ninetta Lights, MD;  Location: [facility];  Service: Orthopedics;  Laterality: Right;  Right Shoulder Arthroscopy with Acrmioploasty, Arhtroscopic rotator cuff repair and Microfracture humeral head  . SHOULDER ARTHROSCOPY WITH ROTATOR CUFF REPAIR Right 07/04/2012   Procedure: SHOULDER ARTHROSCOPY WITH ROTATOR CUFF REPAIR;  Surgeon: Ninetta Lights, MD;  Location: [facility];  Service: Orthopedics;  Laterality: Right;  RIGHT SHOULDER ARTHROSCOPY WITH ARTHROSCOPIC ROTATOR CUFF REPAIR, LYSIS OF ADHESIONS  . TUBAL LIGATION      Social History   Tobacco Use  . Smoking status: Never Smoker  . Smokeless tobacco: Never Used  Substance Use Topics  . Alcohol use: Yes    Comment: glass of wine once a month    Family History  Problem Relation Age of Onset  . Coronary artery disease Father 78       heart disease diagnoised  . Hypertension Father   . Heart disease Father   . Colon cancer Father   . Diabetes Father   . Hyperlipidemia Father   . Diabetes Mother   . Hypertension Mother   . Colon cancer Other        great uncle at 50, great aunt dx at 79, GM age 48  . Colon polyps Other        2 sisters dx at age 76 and 24  . Arthritis Sister   . Hypertension Sister   . Diabetes Sister   . Colon polyps Sister   . Colon cancer Maternal Grandmother   . Ovarian cancer Maternal Grandmother   . Cirrhosis Maternal Grandfather   . Heart disease Paternal Grandmother   . Cervical cancer Sister   . Other Sister        uterine fibroids  . Hypertension Sister   . Diabetes Sister   . Colon polyps Sister   . Colon polyps Brother    . Breast cancer Neg Hx   . Stroke Neg Hx     ROS Per hpi  Objective  Vitals as reported by the patient: none  There were no vitals filed for this visit.  ASSESSMENT and PLAN  1. Adjustment disorder with anxious mood Overall doing well with all the changes she is going thru. Does not want daily  medication, uses lorazepam very prn. pmp reviewed, med refilled  2. Seasonal allergies Provided reassurance. Not well controlled. Adding flonase.   3. Menopausal syndrome (hot flashes) Discussed treatment options. Patient does not feel intervention is needed at this time.   4. HYPERTENSION, BENIGN ESSENTIAL - triamterene-hydrochlorothiazide (MAXZIDE-25) 37.5-25 MG tablet; Take 1 tablet by mouth daily.  Other orders - ALPRAZolam (XANAX) 0.25 MG tablet; Take 1 tablet (0.25 mg total) by mouth daily as needed for anxiety. - fluticasone (FLONASE) 50 MCG/ACT nasal spray; Place 1 spray into both nostrils 2 (two) times daily.  FOLLOW-UP: as scheduled   The above assessment and management plan was discussed with the patient. The patient verbalized understanding of and has agreed to the management plan. Patient is aware to call the clinic if symptoms persist or worsen. Patient is aware when to return to the clinic for a follow-up visit. Patient educated on when it is appropriate to go to the emergency department.    I provided 28 minutes of non-face-to-face time during this encounter.  Rutherford Guys, MD Primary Care at [facility] [place], [place] [address] Ph.  [phone] Fax [phone]

## 2018-08-22 ENCOUNTER — Ambulatory Visit: Payer: PPO | Admitting: Family Medicine

## 2018-08-26 ENCOUNTER — Other Ambulatory Visit: Payer: Self-pay

## 2018-08-26 ENCOUNTER — Ambulatory Visit (INDEPENDENT_AMBULATORY_CARE_PROVIDER_SITE_OTHER): Payer: PPO | Admitting: Family Medicine

## 2018-08-26 DIAGNOSIS — E559 Vitamin D deficiency, unspecified: Secondary | ICD-10-CM

## 2018-08-26 DIAGNOSIS — Z1159 Encounter for screening for other viral diseases: Secondary | ICD-10-CM

## 2018-08-26 DIAGNOSIS — E039 Hypothyroidism, unspecified: Secondary | ICD-10-CM

## 2018-08-26 DIAGNOSIS — I10 Essential (primary) hypertension: Secondary | ICD-10-CM

## 2018-08-26 DIAGNOSIS — R7303 Prediabetes: Secondary | ICD-10-CM

## 2018-08-27 LAB — THYROID PANEL WITH TSH
Free Thyroxine Index: 2.8 (ref 1.2–4.9)
T3 Uptake Ratio: 28 % (ref 24–39)
T4, Total: 9.9 ug/dL (ref 4.5–12.0)
TSH: 1.55 u[IU]/mL (ref 0.450–4.500)

## 2018-08-27 LAB — COMPREHENSIVE METABOLIC PANEL
ALT: 27 IU/L (ref 0–32)
AST: 30 IU/L (ref 0–40)
Albumin/Globulin Ratio: 1.8 (ref 1.2–2.2)
Albumin: 3.5 g/dL — ABNORMAL LOW (ref 3.8–4.9)
Alkaline Phosphatase: 41 IU/L (ref 39–117)
BUN/Creatinine Ratio: 9 (ref 9–23)
BUN: 8 mg/dL (ref 6–24)
Bilirubin Total: 0.3 mg/dL (ref 0.0–1.2)
CO2: 22 mmol/L (ref 20–29)
Calcium: 9.2 mg/dL (ref 8.7–10.2)
Chloride: 104 mmol/L (ref 96–106)
Creatinine, Ser: 0.94 mg/dL (ref 0.57–1.00)
GFR calc Af Amer: 78 mL/min/{1.73_m2} (ref >59)
GFR calc non Af Amer: 68 mL/min/{1.73_m2} (ref >59)
Globulin, Total: 2 g/dL (ref 1.5–4.5)
Glucose: 112 mg/dL — ABNORMAL HIGH (ref 65–99)
Potassium: 4.3 mmol/L (ref 3.5–5.2)
Sodium: 138 mmol/L (ref 134–144)
Total Protein: 5.5 g/dL — ABNORMAL LOW (ref 6.0–8.5)

## 2018-08-27 LAB — LIPID PANEL
Chol/HDL Ratio: 4.4 ratio (ref 0.0–4.4)
Cholesterol, Total: 136 mg/dL (ref 100–199)
HDL: 31 mg/dL — ABNORMAL LOW (ref >39)
LDL Calculated: 86 mg/dL (ref 0–99)
Triglycerides: 93 mg/dL (ref 0–149)
VLDL Cholesterol Cal: 19 mg/dL (ref 5–40)

## 2018-08-27 LAB — HEMOGLOBIN A1C
Est. average glucose Bld gHb Est-mCnc: 126 mg/dL
Hgb A1c MFr Bld: 6 % — ABNORMAL HIGH (ref 4.8–5.6)

## 2018-08-27 LAB — HEPATITIS C ANTIBODY: Hep C Virus Ab: <0.1 s/co ratio (ref 0.0–0.9)

## 2018-08-27 LAB — VITAMIN D 25 HYDROXY (VIT D DEFICIENCY, FRACTURES): Vit D, 25-Hydroxy: 20.7 ng/mL — ABNORMAL LOW (ref 30.0–100.0)

## 2018-09-16 ENCOUNTER — Other Ambulatory Visit: Payer: Self-pay

## 2018-09-16 ENCOUNTER — Telehealth (INDEPENDENT_AMBULATORY_CARE_PROVIDER_SITE_OTHER): Payer: PPO | Admitting: Family Medicine

## 2018-09-16 DIAGNOSIS — F4322 Adjustment disorder with anxiety: Secondary | ICD-10-CM

## 2018-09-16 DIAGNOSIS — E559 Vitamin D deficiency, unspecified: Secondary | ICD-10-CM

## 2018-09-16 DIAGNOSIS — M62830 Muscle spasm of back: Secondary | ICD-10-CM

## 2018-09-16 DIAGNOSIS — N951 Menopausal and female climacteric states: Secondary | ICD-10-CM

## 2018-09-16 MED ORDER — ALPRAZOLAM 0.25 MG PO TABS: 0.2500 mg | ORAL_TABLET | Freq: Every day | ORAL | 1 refills | Status: DC | PRN

## 2018-09-16 MED ORDER — ESCITALOPRAM OXALATE 10 MG PO TABS: 10.0000 mg | ORAL_TABLET | Freq: Every day | ORAL | 2 refills | Status: DC

## 2018-09-16 MED ORDER — BACLOFEN 10 MG PO TABS: 10.0000 mg | ORAL_TABLET | Freq: Two times a day (BID) | ORAL | 3 refills | Status: DC | PRN

## 2018-09-16 MED ORDER — VITAMIN D 125 MCG (5000 UT) PO CAPS: 125.0000 ug | ORAL_CAPSULE | Freq: Every day | ORAL | 11 refills | Status: DC

## 2018-09-16 NOTE — Progress Notes (Signed)
Virtual Visit Note  I connected with patient on 09/16/18 at 308pm by phone and verified that I am speaking with the correct person using two identifiers. Kathleen Mcfarland is currently located at phone and patient is currently with them during visit. The provider, Rutherford Guys, MD is located in their office at time of visit.  I discussed the limitations, risks, security and privacy concerns of performing an evaluation and management service by telephone and the availability of in person appointments. I also discussed with the patient that there may be a patient responsible charge related to this service. The patient expressed understanding and agreed to proceed.   CC: lab results  HPI Last OV March 2020 Reviewed labs with patient? Increased vitamin D to 5000 units a day Anxiety not well controlled Has been taking lorazepam more often Baclofen helps with muscle spams Having hot flashes pmp reviewed, last rx 08/09/2018, qty 20  Lab Results  Component Value Date   TSH 1.550 08/26/2018    Allergies  Allergen Reactions  . Penicillins Hives  . Felodipine Nausea And Vomiting    Prior to Admission medications   Medication Sig Start Date End Date Taking? Authorizing Provider  ALPRAZolam (XANAX) 0.25 MG tablet Take 1 tablet (0.25 mg total) by mouth daily as needed for anxiety. 07/25/18   Rutherford Guys, MD  Ascorbic Acid (VITAMIN C) 1000 MG tablet Take 1,000 mg by mouth daily.    [provider]  baclofen (LIORESAL) 10 MG tablet 1 po bid prn 06/26/18   Meredith Pel, MD  cetirizine (ZYRTEC) 10 MG tablet Take 1 tablet (10 mg total) by mouth daily. 08/21/17   Rutherford Guys, MD  cholecalciferol (VITAMIN D) 1000 UNITS tablet Take 1 tablet (1,000 Units total) by mouth daily. For bone health 01/07/14   Lindell Spar I, NP  fluticasone (FLONASE) 50 MCG/ACT nasal spray Place 1 spray into both nostrils 2 (two) times daily. 07/25/18   Rutherford Guys, MD  Omega-3 1000 MG  CAPS Take by mouth.    [provider]  SYNTHROID 200 MCG tablet TAKE 1 TABLET BY MOUTH ONCE DAILY BEFORE BREAKFAST 07/18/18   Rutherford Guys, MD  triamterene-hydrochlorothiazide (MAXZIDE-25) 37.5-25 MG tablet Take 1 tablet by mouth daily. 07/25/18   Rutherford Guys, MD  vitamin B-12 (CYANOCOBALAMIN) 500 MCG tablet Take 500 mcg by mouth daily.    [provider]    Past Medical History:  Diagnosis Date  . Allergic rhinitis   . Anxiety   . Back pain   . Cataract   . Complication of anesthesia    hard to wake up  . Depression   . Hypertension   . Hypothyroidism   . Osteoarthritis 2012   bilateral knees  . Osteoarthritis of knee   . Osteoporosis     Past Surgical History:  Procedure Laterality Date  . colonscopy     . DILATION AND CURETTAGE OF UTERUS    . ROTATOR CUFF REPAIR    . SHOULDER ARTHROSCOPY  04/13/2011   Procedure: ARTHROSCOPY SHOULDER;  Surgeon: Ninetta Lights, MD;  Location: [facility];  Service: Orthopedics;  Laterality: Right;  Right Shoulder Arthroscopy with Acrmioploasty, Arhtroscopic rotator cuff repair and Microfracture humeral head  . SHOULDER ARTHROSCOPY WITH ROTATOR CUFF REPAIR Right 07/04/2012   Procedure: SHOULDER ARTHROSCOPY WITH ROTATOR CUFF REPAIR;  Surgeon: Ninetta Lights, MD;  Location: [facility];  Service: Orthopedics;  Laterality: Right;  RIGHT SHOULDER ARTHROSCOPY  WITH ARTHROSCOPIC ROTATOR CUFF REPAIR, LYSIS OF ADHESIONS  . TUBAL LIGATION      Social History   Tobacco Use  . Smoking status: Never Smoker  . Smokeless tobacco: Never Used  Substance Use Topics  . Alcohol use: Yes    Comment: glass of wine once a month    Family History  Problem Relation Age of Onset  . Coronary artery disease Father 18       heart disease diagnoised  . Hypertension Father   . Heart disease Father   . Colon cancer Father   . Diabetes Father   . Hyperlipidemia Father   . Diabetes Mother   . Hypertension  Mother   . Colon cancer Other        great uncle at 47, great aunt dx at 56, GM age 52  . Colon polyps Other        2 sisters dx at age 92 and 33  . Arthritis Sister   . Hypertension Sister   . Diabetes Sister   . Colon polyps Sister   . Colon cancer Maternal Grandmother   . Ovarian cancer Maternal Grandmother   . Cirrhosis Maternal Grandfather   . Heart disease Paternal Grandmother   . Cervical cancer Sister   . Other Sister        uterine fibroids  . Hypertension Sister   . Diabetes Sister   . Colon polyps Sister   . Colon polyps Brother   . Breast cancer Neg Hx   . Stroke Neg Hx     ROS Per hpi  Objective  Vitals as reported by the patient: none   ASSESSMENT and PLAN  1. Adjustment disorder with anxious mood 2. Menopausal syndrome (hot flashes) Not well controlled, trial of lexapro, reviewed r/se/b. pmp reviewed. Alprazolam refilled.   3. Back muscle spasm Works well. Med refilled.  - baclofen (LIORESAL) 10 MG tablet; Take 1 tablet (10 mg total) by mouth 2 (two) times daily as needed for muscle spasms. 1 po bid prn  4. Vitamin D deficiency Recheck level ~ aug 2020  Other orders - ALPRAZolam (XANAX) 0.25 MG tablet; Take 1 tablet (0.25 mg total) by mouth daily as needed for anxiety. - escitalopram (LEXAPRO) 10 MG tablet; Take 1 tablet (10 mg total) by mouth at bedtime. - Cholecalciferol (VITAMIN D) 125 MCG (5000 UT) CAPS; Take 125 mcg by mouth daily.  FOLLOW-UP: 4 weeks, anxiety and hotflashes   The above assessment and management plan was discussed with the patient. The patient verbalized understanding of and has agreed to the management plan. Patient is aware to call the clinic if symptoms persist or worsen. Patient is aware when to return to the clinic for a follow-up visit. Patient educated on when it is appropriate to go to the emergency department.    I provided 10 minutes of non-face-to-face time during this encounter.  Rutherford Guys, MD Primary  Care at [facility] [place] [place], [place] [address] Ph.  [phone] Fax [phone]

## 2018-09-16 NOTE — Progress Notes (Signed)
  Pt wold like to speak about the result of the labs that were taken last month, also has other concerns she did not prefer to disclose. completed gad 7, scored a 9.

## 2018-09-17 ENCOUNTER — Telehealth: Payer: Self-pay | Admitting: Family Medicine

## 2018-09-17 NOTE — Telephone Encounter (Signed)
Spoke with patient she was too busy to make appt . She will call back and schedule telemed 1 month out for anxiety around  10/16/2018

## 2018-10-16 ENCOUNTER — Other Ambulatory Visit: Payer: Self-pay | Admitting: Family Medicine

## 2018-10-28 ENCOUNTER — Other Ambulatory Visit: Payer: Self-pay | Admitting: Family Medicine

## 2018-10-29 ENCOUNTER — Other Ambulatory Visit: Payer: Self-pay | Admitting: Family Medicine

## 2018-10-29 DIAGNOSIS — I10 Essential (primary) hypertension: Secondary | ICD-10-CM

## 2018-10-30 ENCOUNTER — Other Ambulatory Visit: Payer: Self-pay | Admitting: *Deleted

## 2018-10-30 ENCOUNTER — Other Ambulatory Visit: Payer: Self-pay | Admitting: Family Medicine

## 2018-10-30 DIAGNOSIS — I10 Essential (primary) hypertension: Secondary | ICD-10-CM

## 2018-10-30 MED ORDER — TRIAMTERENE-HCTZ 37.5-25 MG PO TABS: 1.0000 | ORAL_TABLET | Freq: Every day | ORAL | 0 refills | Status: DC

## 2018-10-30 NOTE — Progress Notes (Signed)
Medication ordered due to failed transmission.

## 2018-10-30 NOTE — Telephone Encounter (Signed)
Requested Prescriptions  Pending Prescriptions Disp Refills  . triamterene-hydrochlorothiazide (MAXZIDE-25) 37.5-25 MG tablet [Pharmacy Med Name: Triamterene-HCTZ 37.5-25 MG Oral Tablet] 30 tablet 0    Sig: Take 1 tablet by mouth once daily     Cardiovascular: Diuretic Combos Failed - 10/29/2018 11:58 AM      Failed - Valid encounter within last 6 months    Recent Outpatient Visits          2 months ago Vitamin D deficiency   [facility], Lilia Argue, MD   8 months ago Pre-diabetes   [facility], Lilia Argue, MD   1 year ago Preop examination   [facility], Lilia Argue, MD   1 year ago HYPERTENSION, BENIGN ESSENTIAL   [facility], Lilia Argue, MD      Future Appointments            In 3 weeks Marlou Sa, Tonna Corner, MD [place] in normal range and within 360 days    Potassium  Date Value Ref Range Status  08/26/2018 4.3 3.5 - 5.2 mmol/L Final         Passed - Na in normal range and within 360 days    Sodium  Date Value Ref Range Status  08/26/2018 138 134 - 144 mmol/L Final         Passed - Cr in normal range and within 360 days    Creat  Date Value Ref Range Status  11/30/2014 0.87 0.50 - 1.05 mg/dL Final   Creatinine, Ser  Date Value Ref Range Status  08/26/2018 0.94 0.57 - 1.00 mg/dL Final         Passed - Ca in normal range and within 360 days    Calcium  Date Value Ref Range Status  08/26/2018 9.2 8.7 - 10.2 mg/dL Final         Passed - Last BP in normal range    BP Readings from Last 1 Encounters:  02/19/18 132/81

## 2018-10-31 ENCOUNTER — Ambulatory Visit (INDEPENDENT_AMBULATORY_CARE_PROVIDER_SITE_OTHER): Payer: PPO | Admitting: Orthopedic Surgery

## 2018-11-01 ENCOUNTER — Telehealth: Payer: PPO | Admitting: Family Medicine

## 2018-11-21 ENCOUNTER — Telehealth (INDEPENDENT_AMBULATORY_CARE_PROVIDER_SITE_OTHER): Payer: PPO | Admitting: Family Medicine

## 2018-11-21 ENCOUNTER — Encounter: Payer: Self-pay | Admitting: Family Medicine

## 2018-11-21 ENCOUNTER — Other Ambulatory Visit: Payer: Self-pay

## 2018-11-21 DIAGNOSIS — F4322 Adjustment disorder with anxiety: Secondary | ICD-10-CM | POA: Diagnosis not present

## 2018-11-21 MED ORDER — ESCITALOPRAM OXALATE 20 MG PO TABS: 20.0000 mg | ORAL_TABLET | Freq: Every day | ORAL | 3 refills | Status: DC

## 2018-11-21 MED ORDER — ALPRAZOLAM 0.25 MG PO TABS: 0.2500 mg | ORAL_TABLET | Freq: Every day | ORAL | 1 refills | Status: DC | PRN

## 2018-11-21 NOTE — Patient Instructions (Signed)
° ° ° °  If you have lab work done today you will be contacted with your lab results within the next 2 weeks.  If you have not heard from us then please contact us. The fastest way to get your results is to register for My Chart. ° ° °IF you received an x-ray today, you will receive an invoice from [facility]. Please contact [facility] at [phone] with questions or concerns regarding your invoice.  ° °IF you received labwork today, you will receive an invoice from [facility]. Please contact [facility] at [phone] with questions or concerns regarding your invoice.  ° °Our billing staff will not be able to assist you with questions regarding bills from these companies. ° °You will be contacted with the lab results as soon as they are available. The fastest way to get your results is to activate your My Chart account. Instructions are located on the last page of this paperwork. If you have not heard from us regarding the results in 2 weeks, please contact this office. °  ° ° ° °

## 2018-11-21 NOTE — Progress Notes (Signed)
Virtual Visit Note  I connected with patient on 11/21/18 at 1241pm by phone and verified that I am speaking with the correct person using two identifiers. Kathleen Mcfarland is currently located at home and patient is currently with them during visit. The provider, Rutherford Guys, MD is located in their office at time of visit.  I discussed the limitations, risks, security and privacy concerns of performing an evaluation and management service by telephone and the availability of in person appointments. I also discussed with the patient that there may be a patient responsible charge related to this service. The patient expressed understanding and agreed to proceed.   CC: anxiety  HPI ? Last OV may 2020 - telemedicine Started lexapro 10mg  once a day  Has been tolerating lexapro Has been helping some Having increased stressors in life Not sleeping well due to anxiety Needing refills of xanax, takes prn Not doing counseling  Allergies  Allergen Reactions  . Penicillins Hives  . Felodipine Nausea And Vomiting    Prior to Admission medications   Medication Sig Start Date End Date Taking? Authorizing Provider  ALPRAZolam (XANAX) 0.25 MG tablet Take 1 tablet (0.25 mg total) by mouth daily as needed for anxiety. 09/16/18  Yes Rutherford Guys, MD  Ascorbic Acid (VITAMIN C) 1000 MG tablet Take 1,000 mg by mouth daily.   Yes [provider]  baclofen (LIORESAL) 10 MG tablet Take 1 tablet (10 mg total) by mouth 2 (two) times daily as needed for muscle spasms. 1 po bid prn 09/16/18  Yes Rutherford Guys, MD  cetirizine (ZYRTEC) 10 MG tablet Take 1 tablet by mouth once daily 10/28/18  Yes Rutherford Guys, MD  Cholecalciferol (VITAMIN D) 125 MCG (5000 UT) CAPS Take 125 mcg by mouth daily. 09/16/18  Yes Rutherford Guys, MD  escitalopram (LEXAPRO) 10 MG tablet Take 1 tablet (10 mg total) by mouth at bedtime. 09/16/18  Yes Rutherford Guys, MD  fluticasone [facility]) 50 MCG/ACT nasal  spray Place 1 spray into both nostrils 2 (two) times daily. 07/25/18  Yes Rutherford Guys, MD  SYNTHROID 200 MCG tablet TAKE 1 TABLET BY MOUTH ONCE DAILY BEFORE BREAKFAST 10/16/18  Yes Rutherford Guys, MD  triamterene-hydrochlorothiazide (MAXZIDE-25) 37.5-25 MG tablet Take 1 tablet by mouth daily. 10/30/18  Yes Rutherford Guys, MD  vitamin B-12 (CYANOCOBALAMIN) 500 MCG tablet Take 500 mcg by mouth daily.   Yes [provider]  Omega-3 1000 MG CAPS Take by mouth.    [provider]    Past Medical History:  Diagnosis Date  . Allergic rhinitis   . Anxiety   . Back pain   . Cataract   . Complication of anesthesia    hard to wake up  . Depression   . Hypertension   . Hypothyroidism   . Osteoarthritis 2012   bilateral knees  . Osteoarthritis of knee   . Osteoporosis     Past Surgical History:  Procedure Laterality Date  . colonscopy     . DILATION AND CURETTAGE OF UTERUS    . ROTATOR CUFF REPAIR    . SHOULDER ARTHROSCOPY  04/13/2011   Procedure: ARTHROSCOPY SHOULDER;  Surgeon: Ninetta Lights, MD;  Location: [facility];  Service: Orthopedics;  Laterality: Right;  Right Shoulder Arthroscopy with Acrmioploasty, Arhtroscopic rotator cuff repair and Microfracture humeral head  . SHOULDER ARTHROSCOPY WITH ROTATOR CUFF REPAIR Right 07/04/2012   Procedure: SHOULDER ARTHROSCOPY WITH ROTATOR CUFF REPAIR;  Surgeon:  Ninetta Lights, MD;  Location: [facility];  Service: Orthopedics;  Laterality: Right;  RIGHT SHOULDER ARTHROSCOPY WITH ARTHROSCOPIC ROTATOR CUFF REPAIR, LYSIS OF ADHESIONS  . TUBAL LIGATION      Social History   Tobacco Use  . Smoking status: Never Smoker  . Smokeless tobacco: Never Used  Substance Use Topics  . Alcohol use: Yes    Comment: glass of wine once a month    Family History  Problem Relation Age of Onset  . Coronary artery disease Father 74       heart disease diagnoised  . Hypertension Father   . Heart disease  Father   . Colon cancer Father   . Diabetes Father   . Hyperlipidemia Father   . Diabetes Mother   . Hypertension Mother   . Colon cancer Other        great uncle at 85, great aunt dx at 56, GM age 15  . Colon polyps Other        2 sisters dx at age 44 and 70  . Arthritis Sister   . Hypertension Sister   . Diabetes Sister   . Colon polyps Sister   . Colon cancer Maternal Grandmother   . Ovarian cancer Maternal Grandmother   . Cirrhosis Maternal Grandfather   . Heart disease Paternal Grandmother   . Cervical cancer Sister   . Other Sister        uterine fibroids  . Hypertension Sister   . Diabetes Sister   . Colon polyps Sister   . Colon polyps Brother   . Breast cancer Neg Hx   . Stroke Neg Hx     ROS Per hpi  Objective  Vitals as reported by the patient: none   ASSESSMENT and PLAN  1. Adjustment disorder with anxious mood Improved. Increase lexapro. Refilled xanax. pmp reviewed. Consider counseling.  Other orders - escitalopram (LEXAPRO) 20 MG tablet; Take 1 tablet (20 mg total) by mouth at bedtime. - ALPRAZolam (XANAX) 0.25 MG tablet; Take 1 tablet (0.25 mg total) by mouth daily as needed for anxiety.  FOLLOW-UP: 4 weeks   The above assessment and management plan was discussed with the patient. The patient verbalized understanding of and has agreed to the management plan. Patient is aware to call the clinic if symptoms persist or worsen. Patient is aware when to return to the clinic for a follow-up visit. Patient educated on when it is appropriate to go to the emergency department.    I provided 10 minutes of non-face-to-face time during this encounter.  Rutherford Guys, MD Primary Care at [facility] [place], [place] [address] Ph.  [phone] Fax [phone]

## 2018-11-21 NOTE — Progress Notes (Signed)
More stressed due to COVID and additional life stressor. Requesting more xanax and refills.

## 2018-11-25 ENCOUNTER — Ambulatory Visit: Payer: Self-pay | Admitting: Orthopedic Surgery

## 2018-11-27 ENCOUNTER — Other Ambulatory Visit: Payer: Self-pay | Admitting: Family Medicine

## 2018-11-27 DIAGNOSIS — I10 Essential (primary) hypertension: Secondary | ICD-10-CM

## 2018-11-27 NOTE — Telephone Encounter (Signed)
Requested Prescriptions  Pending Prescriptions Disp Refills  . triamterene-hydrochlorothiazide (MAXZIDE-25) 37.5-25 MG tablet [Pharmacy Med Name: Triamterene-HCTZ 37.5-25 MG Oral Tablet] 90 tablet 0    Sig: Take 1 tablet by mouth daily.     Cardiovascular: Diuretic Combos Failed - 11/27/2018  3:43 PM      Failed - Valid encounter within last 6 months    Recent Outpatient Visits          3 months ago Vitamin D deficiency   [facility], Lilia Argue, MD   9 months ago Pre-diabetes   [facility], Lilia Argue, MD   1 year ago Preop examination   [facility], Lilia Argue, MD   1 year ago HYPERTENSION, BENIGN ESSENTIAL   [facility], Lilia Argue, MD             Passed - K in normal range and within 360 days    Potassium  Date Value Ref Range Status  08/26/2018 4.3 3.5 - 5.2 mmol/L Final         Passed - Na in normal range and within 360 days    Sodium  Date Value Ref Range Status  08/26/2018 138 134 - 144 mmol/L Final         Passed - Cr in normal range and within 360 days    Creat  Date Value Ref Range Status  11/30/2014 0.87 0.50 - 1.05 mg/dL Final   Creatinine, Ser  Date Value Ref Range Status  08/26/2018 0.94 0.57 - 1.00 mg/dL Final         Passed - Ca in normal range and within 360 days    Calcium  Date Value Ref Range Status  08/26/2018 9.2 8.7 - 10.2 mg/dL Final         Passed - Last BP in normal range    BP Readings from Last 1 Encounters:  02/19/18 132/81

## 2018-12-05 ENCOUNTER — Telehealth (INDEPENDENT_AMBULATORY_CARE_PROVIDER_SITE_OTHER): Payer: PPO | Admitting: Family Medicine

## 2018-12-05 ENCOUNTER — Other Ambulatory Visit: Payer: Self-pay

## 2018-12-05 DIAGNOSIS — Z20822 Contact with and (suspected) exposure to covid-19: Secondary | ICD-10-CM

## 2018-12-05 DIAGNOSIS — Z20828 Contact with and (suspected) exposure to other viral communicable diseases: Secondary | ICD-10-CM

## 2018-12-05 NOTE — Progress Notes (Signed)
Virtual Visit via Telephone Note  I connected with Kathleen Mcfarland on 12/05/18 at 6:49 PM by telephone and verified that I am speaking with the correct person using two identifiers.   I discussed the limitations, risks, security and privacy concerns of performing an evaluation and management service by telephone and the availability of in person appointments. I also discussed with the patient that there may be a patient responsible charge related to this service. The patient expressed understanding and agreed to proceed, consent obtained  Chief complaint:  covid testing.   History of Present Illness: Kathleen Mcfarland is a 58 y.o. female  Wants to have Covid testing.  Does not have any symptoms.   Sister and her son went to beach last Saturday, sister in hospital now with Covid.  Sister's son and fiance also positive, and their children were positive. They had cookout about 6 days ago.   Patients daughter and her children were at cookout and then visited patient after cookout for about a cookout , but they have not had any symptoms, and they were tested today as well. Has not seen family since last Saturday.   Potential exposure to her dtr and kids 6 days ago.    Patient Active Problem List   Diagnosis Date Noted  . Pre-diabetes 06/04/2017  . Menopausal syndrome (hot flashes) 06/04/2017  . History of colonic polyps 06/04/2017  . Chronic use of opiate for therapeutic purpose 07/03/2016  . Disorder of rotator cuff of both shoulders 05/19/2015  . Obesity (BMI 30-39.9) 05/19/2015  . Abnormal perimenopausal bleeding 03/04/2015  . Suicidal ideation   . Back muscle spasm 01/30/2014  . Severe recurrent major depressive disorder with psychotic features ([place]) 01/03/2014  . Depression 01/02/2014  . Urinary incontinence 07/21/2010  . MENSTRUAL DISORDER 01/12/2009  . HYPERTENSION, BENIGN ESSENTIAL 07/31/2006  . Hypothyroidism (acquired) 06/23/2006  . Seasonal allergies  06/23/2006  . Osteoarthrosis involving lower leg 06/23/2006   Past Medical History:  Diagnosis Date  . Allergic rhinitis   . Anxiety   . Back pain   . Cataract   . Complication of anesthesia    hard to wake up  . Depression   . Hypertension   . Hypothyroidism   . Osteoarthritis 2012   bilateral knees  . Osteoarthritis of knee   . Osteoporosis    Past Surgical History:  Procedure Laterality Date  . colonscopy     . DILATION AND CURETTAGE OF UTERUS    . ROTATOR CUFF REPAIR    . SHOULDER ARTHROSCOPY  04/13/2011   Procedure: ARTHROSCOPY SHOULDER;  Surgeon: Ninetta Lights, MD;  Location: [facility];  Service: Orthopedics;  Laterality: Right;  Right Shoulder Arthroscopy with Acrmioploasty, Arhtroscopic rotator cuff repair and Microfracture humeral head  . SHOULDER ARTHROSCOPY WITH ROTATOR CUFF REPAIR Right 07/04/2012   Procedure: SHOULDER ARTHROSCOPY WITH ROTATOR CUFF REPAIR;  Surgeon: Ninetta Lights, MD;  Location: [facility];  Service: Orthopedics;  Laterality: Right;  RIGHT SHOULDER ARTHROSCOPY WITH ARTHROSCOPIC ROTATOR CUFF REPAIR, LYSIS OF ADHESIONS  . TUBAL LIGATION     Allergies  Allergen Reactions  . Penicillins Hives  . Felodipine Nausea And Vomiting   Prior to Admission medications   Medication Sig Start Date End Date Taking? Authorizing Provider  ALPRAZolam (XANAX) 0.25 MG tablet Take 1 tablet (0.25 mg total) by mouth daily as needed for anxiety. 11/21/18  Yes Rutherford Guys, MD  Ascorbic Acid (VITAMIN C) 1000 MG tablet Take 1,000 mg  by mouth daily.   Yes [provider]  baclofen (LIORESAL) 10 MG tablet Take 1 tablet (10 mg total) by mouth 2 (two) times daily as needed for muscle spasms. 1 po bid prn 09/16/18  Yes Rutherford Guys, MD  cetirizine (ZYRTEC) 10 MG tablet Take 1 tablet by mouth once daily 10/28/18  Yes Rutherford Guys, MD  Cholecalciferol (VITAMIN D) 125 MCG (5000 UT) CAPS Take 125 mcg by mouth daily. 09/16/18  Yes  Rutherford Guys, MD  escitalopram (LEXAPRO) 20 MG tablet Take 1 tablet (20 mg total) by mouth at bedtime. 11/21/18  Yes Rutherford Guys, MD  fluticasone (FLONASE) 50 MCG/ACT nasal spray Place 1 spray into both nostrils 2 (two) times daily. 07/25/18  Yes Rutherford Guys, MD  Omega-3 1000 MG CAPS Take by mouth.   Yes [provider]  SYNTHROID 200 MCG tablet TAKE 1 TABLET BY MOUTH ONCE DAILY BEFORE BREAKFAST 10/16/18  Yes Rutherford Guys, MD  triamterene-hydrochlorothiazide (MAXZIDE-25) 37.5-25 MG tablet Take 1 tablet by mouth daily. 11/27/18  Yes Rutherford Guys, MD  vitamin B-12 (CYANOCOBALAMIN) 500 MCG tablet Take 500 mcg by mouth daily.   Yes [provider]   Social History   Socioeconomic History  . Marital status: Single    Spouse name: Not on file  . Number of children: 2  . Years of education: Not on file  . Highest education level: Not on file  Occupational History  . Occupation: None  Social Needs  . Financial resource strain: Not on file  . Food insecurity    Worry: Not on file    Inability: Not on file  . Transportation needs    Medical: Not on file    Non-medical: Not on file  Tobacco Use  . Smoking status: Never Smoker  . Smokeless tobacco: Never Used  Substance and Sexual Activity  . Alcohol use: Yes    Comment: glass of wine once a month  . Drug use: No  . Sexual activity: Yes    Birth control/protection: Other-see comments    Comment: BTL  Lifestyle  . Physical activity    Days per week: Not on file    Minutes per session: Not on file  . Stress: Not on file  Relationships  . Social Herbalist on phone: Not on file    Gets together: Not on file    Attends religious service: Not on file    Active member of club or organization: Not on file    Attends meetings of clubs or organizations: Not on file    Relationship status: Not on file  . Intimate partner violence    Fear of current or ex partner: Not on file    Emotionally  abused: Not on file    Physically abused: Not on file    Forced sexual activity: Not on file  Other Topics Concern  . Not on file  Social History Narrative   Regular Exercise- yes 3/week   Caffeine- no     Observations/Objective: No distress, appropriate responses, understanding of plan expressed  Assessment and Plan: Exposure to Covid-19 Virus - Plan: Novel Coronavirus, N[facility] ([facility])  -Possible indirect exposure with daughter and children who had been at a cookout with COVID positive persons.  Asymptomatic.  Testing ordered, recommended isolation for another 8 days if possible, with precautions/follow-up visit if symptomatic.  Follow Up Instructions:   prn  Patient Instructions   I will   If  you have lab work done today you will be contacted with your lab results within the next 2 weeks.  If you have not heard from [place] then please contact us. The fastest way to get your results is to register for My Chart.   IF you received an x-ray today, you will receive an invoice from [facility]. Please contact [facility] at [phone] with questions or concerns regarding your invoice.   IF you received labwork today, you will receive an invoice from [facility]. Please contact [facility] at [phone] with questions or concerns regarding your invoice.   Our billing staff will not be able to assist you with questions regarding bills from these companies.  You will be contacted with the lab results as soon as they are available. The fastest way to get your results is to activate your My Chart account. Instructions are located on the last page of this paperwork. If you have not heard from [place] regarding the results in 2 weeks, please contact this office.        I discussed the assessment and treatment plan with the patient. The patient was provided an opportunity to ask questions and all were answered. The patient agreed with the plan and demonstrated an understanding of  the instructions.   The patient was advised to call back or seek an in-person evaluation if the symptoms worsen or if the condition fails to improve as anticipated.  I provided 13 minutes of non-face-to-face time during this encounter.  Signed,   Merri Ray, MD Primary Care at [facility].  12/05/18

## 2018-12-05 NOTE — Patient Instructions (Addendum)
I will order the covid test at [place] for drive up testing.  I do recommend trying to isolate as much as possible over the next 8 days due to possible exposure to COVID positive person although that is less likely as you were not at the cookout yourself.  If you develop new symptoms, please let us know.  Take care and let me know if there are questions.   If you have lab work done today you will be contacted with your lab results within the next 2 weeks.  If you have not heard from [place] then please contact us. The fastest way to get your results is to register for My Chart.   IF you received an x-ray today, you will receive an invoice from [facility]. Please contact [facility] at [phone] with questions or concerns regarding your invoice.   IF you received labwork today, you will receive an invoice from [facility]. Please contact [facility] at [phone] with questions or concerns regarding your invoice.   Our billing staff will not be able to assist you with questions regarding bills from these companies.  You will be contacted with the lab results as soon as they are available. The fastest way to get your results is to activate your My Chart account. Instructions are located on the last page of this paperwork. If you have not heard from [place] regarding the results in 2 weeks, please contact this office.

## 2018-12-05 NOTE — Progress Notes (Signed)
CC- Patient is not having any symptoms but grand-kids came over to her house after been at an effected family member.

## 2018-12-06 ENCOUNTER — Other Ambulatory Visit: Payer: Self-pay

## 2018-12-06 DIAGNOSIS — Z20822 Contact with and (suspected) exposure to covid-19: Secondary | ICD-10-CM

## 2018-12-07 LAB — NOVEL CORONAVIRUS, NAA: SARS-CoV-2, NAA: NOT DETECTED

## 2019-01-14 ENCOUNTER — Other Ambulatory Visit: Payer: Self-pay

## 2019-01-14 ENCOUNTER — Encounter: Payer: Self-pay | Admitting: Family Medicine

## 2019-01-14 ENCOUNTER — Telehealth (INDEPENDENT_AMBULATORY_CARE_PROVIDER_SITE_OTHER): Payer: PPO | Admitting: Family Medicine

## 2019-01-14 DIAGNOSIS — E559 Vitamin D deficiency, unspecified: Secondary | ICD-10-CM | POA: Diagnosis not present

## 2019-01-14 DIAGNOSIS — E039 Hypothyroidism, unspecified: Secondary | ICD-10-CM

## 2019-01-14 DIAGNOSIS — F4322 Adjustment disorder with anxiety: Secondary | ICD-10-CM

## 2019-01-14 DIAGNOSIS — I10 Essential (primary) hypertension: Secondary | ICD-10-CM

## 2019-01-14 DIAGNOSIS — R7303 Prediabetes: Secondary | ICD-10-CM | POA: Diagnosis not present

## 2019-01-14 MED ORDER — SYNTHROID 200 MCG PO TABS: ORAL_TABLET | ORAL | 1 refills | Status: DC

## 2019-01-14 MED ORDER — ALPRAZOLAM 0.25 MG PO TABS: 0.1250 mg | ORAL_TABLET | Freq: Every day | ORAL | 1 refills | Status: DC | PRN

## 2019-01-14 MED ORDER — ESCITALOPRAM OXALATE 10 MG PO TABS: 10.0000 mg | ORAL_TABLET | Freq: Every day | ORAL | 1 refills | Status: DC

## 2019-01-14 NOTE — Progress Notes (Signed)
Virtual Visit Note  I connected with patient on 01/14/19 at 552pm by phone and verified that I am speaking with the correct person using two identifiers. Kathleen Mcfarland is currently located at home and patient is currently with them during visit. The provider, Rutherford Guys, MD is located in their office at time of visit.  I discussed the limitations, risks, security and privacy concerns of performing an evaluation and management service by telephone and the availability of in person appointments. I also discussed with the patient that there may be a patient responsible charge related to this service. The patient expressed understanding and agreed to proceed.   CC: anxiety  HPI ? Last OV telemedicine July 2020 Increased lexapro to 20mg  daily pmp reviewed, last xanax #20 filled July 2020  Patient reports that xanax is too strong She has continued to take lexapro 10mg  at bedtime Anxiety doing really well  Does not check BP at home  She is also having concerns of catching painful thumb, no trauma  BP Readings from Last 3 Encounters:  02/19/18 132/81  11/30/17 133/74  08/21/17 130/60   Lab Results  Component Value Date   TSH 1.550 08/26/2018   Lab Results  Component Value Date   CREATININE 0.94 08/26/2018   BUN 8 08/26/2018   NA 138 08/26/2018   K 4.3 08/26/2018   CL 104 08/26/2018   CO2 22 08/26/2018   Lab Results  Component Value Date   HGBA1C 6.0 (H) 08/26/2018    Allergies  Allergen Reactions  . Penicillins Hives  . Felodipine Nausea And Vomiting    Prior to Admission medications   Medication Sig Start Date End Date Taking? Authorizing Provider  ALPRAZolam (XANAX) 0.25 MG tablet Take 1 tablet (0.25 mg total) by mouth daily as needed for anxiety. 11/21/18  Yes Rutherford Guys, MD  Ascorbic Acid (VITAMIN C) 1000 MG tablet Take 1,000 mg by mouth daily.   Yes [provider]  baclofen (LIORESAL) 10 MG tablet Take 1 tablet (10 mg total) by  mouth 2 (two) times daily as needed for muscle spasms. 1 po bid prn 09/16/18  Yes Rutherford Guys, MD  cetirizine (ZYRTEC) 10 MG tablet Take 1 tablet by mouth once daily 10/28/18  Yes Rutherford Guys, MD  Cholecalciferol (VITAMIN D) 125 MCG (5000 UT) CAPS Take 125 mcg by mouth daily. 09/16/18  Yes Rutherford Guys, MD  SYNTHROID 200 MCG tablet TAKE 1 TABLET BY MOUTH ONCE DAILY BEFORE BREAKFAST 10/16/18  Yes Rutherford Guys, MD  triamterene-hydrochlorothiazide (MAXZIDE-25) 37.5-25 MG tablet Take 1 tablet by mouth daily. 11/27/18  Yes Rutherford Guys, MD  vitamin B-12 (CYANOCOBALAMIN) 500 MCG tablet Take 500 mcg by mouth daily.   Yes [provider]  escitalopram (LEXAPRO) 20 MG tablet Take 1 tablet (20 mg total) by mouth at bedtime. Patient not taking: Reported on 01/14/2019 11/21/18   Rutherford Guys, MD    Past Medical History:  Diagnosis Date  . Allergic rhinitis   . Anxiety   . Back pain   . Cataract   . Complication of anesthesia    hard to wake up  . Depression   . Hypertension   . Hypothyroidism   . Osteoarthritis 2012   bilateral knees  . Osteoarthritis of knee   . Osteoporosis     Past Surgical History:  Procedure Laterality Date  . colonscopy     . DILATION AND CURETTAGE OF UTERUS    . ROTATOR  CUFF REPAIR    . SHOULDER ARTHROSCOPY  04/13/2011   Procedure: ARTHROSCOPY SHOULDER;  Surgeon: Ninetta Lights, MD;  Location: [facility];  Service: Orthopedics;  Laterality: Right;  Right Shoulder Arthroscopy with Acrmioploasty, Arhtroscopic rotator cuff repair and Microfracture humeral head  . SHOULDER ARTHROSCOPY WITH ROTATOR CUFF REPAIR Right 07/04/2012   Procedure: SHOULDER ARTHROSCOPY WITH ROTATOR CUFF REPAIR;  Surgeon: Ninetta Lights, MD;  Location: [facility];  Service: Orthopedics;  Laterality: Right;  RIGHT SHOULDER ARTHROSCOPY WITH ARTHROSCOPIC ROTATOR CUFF REPAIR, LYSIS OF ADHESIONS  . TUBAL LIGATION      Social History   Tobacco  Use  . Smoking status: Never Smoker  . Smokeless tobacco: Never Used  Substance Use Topics  . Alcohol use: Yes    Comment: glass of wine once a month    Family History  Problem Relation Age of Onset  . Coronary artery disease Father 86       heart disease diagnoised  . Hypertension Father   . Heart disease Father   . Colon cancer Father   . Diabetes Father   . Hyperlipidemia Father   . Diabetes Mother   . Hypertension Mother   . Colon cancer Other        great uncle at 59, great aunt dx at 53, GM age 58  . Colon polyps Other        2 sisters dx at age 41 and 27  . Arthritis Sister   . Hypertension Sister   . Diabetes Sister   . Colon polyps Sister   . Colon cancer Maternal Grandmother   . Ovarian cancer Maternal Grandmother   . Cirrhosis Maternal Grandfather   . Heart disease Paternal Grandmother   . Cervical cancer Sister   . Other Sister        uterine fibroids  . Hypertension Sister   . Diabetes Sister   . Colon polyps Sister   . Colon polyps Brother   . Breast cancer Neg Hx   . Stroke Neg Hx     ROS Per hpi  Objective  Vitals as reported by the patient: none   ASSESSMENT and PLAN  1. Adjustment disorder with anxious mood improved. Cont current mgt. Defers counseling referral at this time.   2. Hypothyroidism (acquired) Checking labs today, medications will be adjusted as needed.  - TSH; Future  3. Pre-diabetes - Hemoglobin A1c; Future  4. HYPERTENSION, BENIGN ESSENTIAL - Comprehensive metabolic panel; Future  5. Vitamin D deficiency - VITAMIN D 25 Hydroxy (Vit-D Deficiency, Fractures); Future  Other orders - SYNTHROID 200 MCG tablet; TAKE 1 TABLET BY MOUTH ONCE DAILY BEFORE BREAKFAST - escitalopram (LEXAPRO) 10 MG tablet; Take 1 tablet (10 mg total) by mouth at bedtime. - ALPRAZolam (XANAX) 0.25 MG tablet; Take 0.5 tablets (0.125 mg total) by mouth daily as needed for anxiety.  FOLLOW-UP: next available in office for eval of thumb   The  above assessment and management plan was discussed with the patient. The patient verbalized understanding of and has agreed to the management plan. Patient is aware to call the clinic if symptoms persist or worsen. Patient is aware when to return to the clinic for a follow-up visit. Patient educated on when it is appropriate to go to the emergency department.    I provided 13 minutes of non-face-to-face time during this encounter.  Rutherford Guys, MD Primary Care at [facility], [place] [address] Ph.  [phone] Fax [phone]

## 2019-01-14 NOTE — Progress Notes (Signed)
Pt is asking for an adjustment on her depression medication. Says the lexapro dose that she is currently on make her feel "out of it". She is asking for the med that she was on before. She is also wanting to discuss another matter with pcp only. Medication and pharmacy verified

## 2019-01-24 ENCOUNTER — Ambulatory Visit (INDEPENDENT_AMBULATORY_CARE_PROVIDER_SITE_OTHER): Payer: PPO

## 2019-01-24 ENCOUNTER — Ambulatory Visit (INDEPENDENT_AMBULATORY_CARE_PROVIDER_SITE_OTHER): Payer: PPO | Admitting: Family Medicine

## 2019-01-24 ENCOUNTER — Encounter: Payer: Self-pay | Admitting: Family Medicine

## 2019-01-24 ENCOUNTER — Other Ambulatory Visit: Payer: Self-pay

## 2019-01-24 ENCOUNTER — Other Ambulatory Visit: Payer: Self-pay | Admitting: Family Medicine

## 2019-01-24 VITALS — BP 126/75 | HR 90 | Temp 97.9°F | Ht 66.0 in | Wt 241.0 lb

## 2019-01-24 DIAGNOSIS — E039 Hypothyroidism, unspecified: Secondary | ICD-10-CM

## 2019-01-24 DIAGNOSIS — M19041 Primary osteoarthritis, right hand: Secondary | ICD-10-CM | POA: Diagnosis not present

## 2019-01-24 DIAGNOSIS — Z23 Encounter for immunization: Secondary | ICD-10-CM

## 2019-01-24 DIAGNOSIS — M25541 Pain in joints of right hand: Secondary | ICD-10-CM

## 2019-01-24 DIAGNOSIS — R7303 Prediabetes: Secondary | ICD-10-CM

## 2019-01-24 DIAGNOSIS — M65311 Trigger thumb, right thumb: Secondary | ICD-10-CM

## 2019-01-24 DIAGNOSIS — E559 Vitamin D deficiency, unspecified: Secondary | ICD-10-CM | POA: Diagnosis not present

## 2019-01-24 DIAGNOSIS — M62830 Muscle spasm of back: Secondary | ICD-10-CM

## 2019-01-24 DIAGNOSIS — I10 Essential (primary) hypertension: Secondary | ICD-10-CM | POA: Diagnosis not present

## 2019-01-24 MED ORDER — IBUPROFEN 600 MG PO TABS: 600.0000 mg | ORAL_TABLET | Freq: Three times a day (TID) | ORAL | 0 refills | Status: DC | PRN

## 2019-01-24 MED ORDER — BACLOFEN 10 MG PO TABS: 10.0000 mg | ORAL_TABLET | Freq: Two times a day (BID) | ORAL | 3 refills | Status: DC | PRN

## 2019-01-24 NOTE — Patient Instructions (Signed)
° ° ° °  If you have lab work done today you will be contacted with your lab results within the next 2 weeks.  If you have not heard from us then please contact us. The fastest way to get your results is to register for My Chart. ° ° °IF you received an x-ray today, you will receive an invoice from [facility]. Please contact [facility] at [phone] with questions or concerns regarding your invoice.  ° °IF you received labwork today, you will receive an invoice from [facility]. Please contact [facility] at [phone] with questions or concerns regarding your invoice.  ° °Our billing staff will not be able to assist you with questions regarding bills from these companies. ° °You will be contacted with the lab results as soon as they are available. The fastest way to get your results is to activate your My Chart account. Instructions are located on the last page of this paperwork. If you have not heard from us regarding the results in 2 weeks, please contact this office. °  ° ° ° °

## 2019-01-24 NOTE — Progress Notes (Signed)
9/25/202011:21 AM  Kathleen Mcfarland [birth year], 58 y.o., female [id number]  Chief Complaint  Patient presents with  . Follow-up    mood, anxiety, vit d, hypothyroid. Says the lexapro increase is working well. Taking half xanax prn. Still having thumb pain, wants to talk about arthritis    HPI:   Patient is a 58 y.o. female with past medical history significant for anxiety, HTN, vitamin D deficiency who presents today for routine fu  Anxiety much better on higher dose of lexapro life stressors are improving  Right thumb catches, swollen, very painful, no warmth or redness Difficulty with opening jars/bottles No injury Right handed Known OA of knees, sometimes needs to use a cane Worried about RA, runs in her family  Has been working on her diet and exercise Reports has lost 6 lbs  Requesting refill of baclofen, takes prn for back muscle spasms  Lab Results  Component Value Date   HGBA1C 6.0 (H) 08/26/2018   HGBA1C 6.1 (A) 02/19/2018   HGBA1C 5.8 (H) 06/04/2017   Lab Results  Component Value Date   LDLCALC 86 08/26/2018   CREATININE 0.94 08/26/2018   Lab Results  Component Value Date   TSH 1.550 08/26/2018    Depression screen PHQ 2/9 01/24/2019 01/14/2019 12/05/2018  Decreased Interest 0 2 0  Down, Depressed, Hopeless 0 2 0  PHQ - 2 Score 0 4 0  Altered sleeping - 2 -  Tired, decreased energy - 2 -  Change in appetite - 1 -  Feeling bad or failure about yourself  - 0 -  Trouble concentrating - 0 -  Moving slowly or fidgety/restless - 0 -  Suicidal thoughts - 0 -  PHQ-9 Score - 9 -  Difficult doing work/chores - Not difficult at all -    Fall Risk  01/24/2019 01/14/2019 01/14/2019 12/05/2018 12/05/2018  Falls in the past year? 0 0 0 0 0  Number falls in past yr: 0 0 0 0 0  Injury with Fall? 0 0 0 0 -  Risk for fall due to : - - - - -  Follow up - - - Falls evaluation completed Falls evaluation completed     Allergies  Allergen Reactions  . Penicillins  Hives  . Felodipine Nausea And Vomiting    Prior to Admission medications   Medication Sig Start Date End Date Taking? Authorizing Provider  ALPRAZolam Duanne Moron) 0.25 MG tablet Take 0.5 tablets (0.125 mg total) by mouth daily as needed for anxiety. Patient taking differently: Take 0.125 mg by mouth daily as needed for anxiety (taking half at prn).  01/14/19  Yes Rutherford Guys, MD  Ascorbic Acid (VITAMIN C) 1000 MG tablet Take 1,000 mg by mouth daily.   Yes [provider]  baclofen (LIORESAL) 10 MG tablet Take 1 tablet (10 mg total) by mouth 2 (two) times daily as needed for muscle spasms. 1 po bid prn 09/16/18  Yes Rutherford Guys, MD  cetirizine (ZYRTEC) 10 MG tablet Take 1 tablet by mouth once daily 10/28/18  Yes Rutherford Guys, MD  Cholecalciferol (VITAMIN D) 125 MCG (5000 UT) CAPS Take 125 mcg by mouth daily. 09/16/18  Yes Rutherford Guys, MD  escitalopram (LEXAPRO) 10 MG tablet Take 1 tablet (10 mg total) by mouth at bedtime. 01/14/19  Yes Rutherford Guys, MD  SYNTHROID 200 MCG tablet TAKE 1 TABLET BY MOUTH ONCE DAILY BEFORE BREAKFAST 01/14/19  Yes Rutherford Guys, MD  triamterene-hydrochlorothiazide (MAXZIDE-25) 37.5-25 MG tablet  Take 1 tablet by mouth daily. 11/27/18  Yes Rutherford Guys, MD  vitamin B-12 (CYANOCOBALAMIN) 500 MCG tablet Take 500 mcg by mouth daily.   Yes [provider]    Past Medical History:  Diagnosis Date  . Allergic rhinitis   . Anxiety   . Back pain   . Cataract   . Complication of anesthesia    hard to wake up  . Depression   . Hypertension   . Hypothyroidism   . Osteoarthritis 2012   bilateral knees  . Osteoarthritis of knee   . Osteoporosis     Past Surgical History:  Procedure Laterality Date  . colonscopy     . DILATION AND CURETTAGE OF UTERUS    . ROTATOR CUFF REPAIR    . SHOULDER ARTHROSCOPY  04/13/2011   Procedure: ARTHROSCOPY SHOULDER;  Surgeon: Ninetta Lights, MD;  Location: [facility];  Service:  Orthopedics;  Laterality: Right;  Right Shoulder Arthroscopy with Acrmioploasty, Arhtroscopic rotator cuff repair and Microfracture humeral head  . SHOULDER ARTHROSCOPY WITH ROTATOR CUFF REPAIR Right 07/04/2012   Procedure: SHOULDER ARTHROSCOPY WITH ROTATOR CUFF REPAIR;  Surgeon: Ninetta Lights, MD;  Location: [facility];  Service: Orthopedics;  Laterality: Right;  RIGHT SHOULDER ARTHROSCOPY WITH ARTHROSCOPIC ROTATOR CUFF REPAIR, LYSIS OF ADHESIONS  . TUBAL LIGATION      Social History   Tobacco Use  . Smoking status: Never Smoker  . Smokeless tobacco: Never Used  Substance Use Topics  . Alcohol use: Yes    Comment: glass of wine once a month    Family History  Problem Relation Age of Onset  . Coronary artery disease Father 63       heart disease diagnoised  . Hypertension Father   . Heart disease Father   . Colon cancer Father   . Diabetes Father   . Hyperlipidemia Father   . Diabetes Mother   . Hypertension Mother   . Colon cancer Other        great uncle at 62, great aunt dx at 80, GM age 71  . Colon polyps Other        2 sisters dx at age 61 and 15  . Arthritis Sister   . Hypertension Sister   . Diabetes Sister   . Colon polyps Sister   . Colon cancer Maternal Grandmother   . Ovarian cancer Maternal Grandmother   . Cirrhosis Maternal Grandfather   . Heart disease Paternal Grandmother   . Cervical cancer Sister   . Other Sister        uterine fibroids  . Hypertension Sister   . Diabetes Sister   . Colon polyps Sister   . Colon polyps Brother   . Breast cancer Neg Hx   . Stroke Neg Hx     Review of Systems  Constitutional: Negative for chills and fever.  Respiratory: Negative for cough and shortness of breath.   Cardiovascular: Negative for chest pain, palpitations and leg swelling.  Gastrointestinal: Negative for abdominal pain, nausea and vomiting.  per hpi   OBJECTIVE:  Today's Vitals   01/24/19 1101  BP: 126/75  Pulse: 90  Temp: 97.9  F (36.6 C)  SpO2: 100%  Weight: 241 lb (109.3 kg)  Height: 5\' 6"  (1.676 m)   Body mass index is 38.9 kg/m.   Physical Exam Vitals signs and nursing note reviewed.  Constitutional:      Appearance: She is well-developed.  HENT:  Head: Normocephalic and atraumatic.     Mouth/Throat:     Pharynx: No oropharyngeal exudate.  Eyes:     General: No scleral icterus.    Conjunctiva/sclera: Conjunctivae normal.     Pupils: Pupils are equal, round, and reactive to light.  Neck:     Musculoskeletal: Neck supple.  Cardiovascular:     Rate and Rhythm: Normal rate and regular rhythm.     Heart sounds: Normal heart sounds. No murmur. No friction rub. No gallop.   Pulmonary:     Effort: Pulmonary effort is normal.     Breath sounds: Normal breath sounds. No wheezing or rales.  Musculoskeletal:       Hands:     Right lower leg: No edema.     Left lower leg: No edema.  Skin:    General: Skin is warm and dry.  Neurological:     Mental Status: She is alert and oriented to person, place, and time.     No results found for this or any previous visit (from the past 24 hour(s)).  Dg Hand Complete Right  Result Date: 01/24/2019 CLINICAL DATA:  First MCP joint swelling and pain, no trauma EXAM: RIGHT HAND - COMPLETE 3+ VIEW COMPARISON:  None FINDINGS: Low normal osseous mineralization. Joint spaces preserved. Minimal spurring at IP joint thumb and at scattered DIP joints. No acute fracture, dislocation, or bone destruction. IMPRESSION: Mild degenerative changes at IP joint thumb and scattered DIP joints. No acute osseous abnormalities. Electronically Signed   By: Lavonia Dana M.D.   On: 01/24/2019 12:34     ASSESSMENT and PLAN  1. Trigger finger of right thumb - Ambulatory referral to Hand Surgery  2. Arthralgia of right hand Xray shows OA. Labs pending as requested by patient - Sedimentation Rate - Rheumatoid factor  3. HYPERTENSION, BENIGN ESSENTIAL Controlled. Continue current  regime.  - Lipid panel - Comprehensive metabolic panel  4. Hypothyroidism (acquired) Checking labs today, medications will be adjusted as needed.  - TSH  5. Vitamin D deficiency Checking labs today, medications will be adjusted as needed.  - Vitamin D, 25-hydroxy  6. Pre-diabetes Cont with LFM - Hemoglobin A1c  7. Back muscle spasm - baclofen (LIORESAL) 10 MG tablet; Take 1 tablet (10 mg total) by mouth 2 (two) times daily as needed for muscle spasms. 1 po bid prn  8. Need for prophylactic vaccination and inoculation against influenza - Flu Vaccine QUAD 36+ mos IM  Other orders - ibuprofen (ADVIL) 600 MG tablet; Take 1 tablet (600 mg total) by mouth every 8 (eight) hours as needed.  Return in about 6 months (around 07/24/2019).    Rutherford Guys, MD Primary Care at [place], [place] [address] Ph.  [phone] Fax [phone]

## 2019-01-25 LAB — RHEUMATOID FACTOR: Rheumatoid fact SerPl-aCnc: <10 IU/mL (ref 0.0–13.9)

## 2019-01-25 LAB — LIPID PANEL
Chol/HDL Ratio: 4.9 ratio — ABNORMAL HIGH (ref 0.0–4.4)
Cholesterol, Total: 142 mg/dL (ref 100–199)
HDL: 29 mg/dL — ABNORMAL LOW (ref >39)
LDL Chol Calc (NIH): 73 mg/dL (ref 0–99)
Triglycerides: 244 mg/dL — ABNORMAL HIGH (ref 0–149)
VLDL Cholesterol Cal: 40 mg/dL (ref 5–40)

## 2019-01-25 LAB — COMPREHENSIVE METABOLIC PANEL
ALT: 30 IU/L (ref 0–32)
AST: 34 IU/L (ref 0–40)
Albumin/Globulin Ratio: 1.7 (ref 1.2–2.2)
Albumin: 3.8 g/dL (ref 3.8–4.9)
Alkaline Phosphatase: 59 IU/L (ref 39–117)
BUN/Creatinine Ratio: 13 (ref 9–23)
BUN: 12 mg/dL (ref 6–24)
Bilirubin Total: 0.3 mg/dL (ref 0.0–1.2)
CO2: 24 mmol/L (ref 20–29)
Calcium: 9.8 mg/dL (ref 8.7–10.2)
Chloride: 105 mmol/L (ref 96–106)
Creatinine, Ser: 0.91 mg/dL (ref 0.57–1.00)
GFR calc Af Amer: 82 mL/min/{1.73_m2} (ref >59)
GFR calc non Af Amer: 71 mL/min/{1.73_m2} (ref >59)
Globulin, Total: 2.2 g/dL (ref 1.5–4.5)
Glucose: 110 mg/dL — ABNORMAL HIGH (ref 65–99)
Potassium: 4 mmol/L (ref 3.5–5.2)
Sodium: 143 mmol/L (ref 134–144)
Total Protein: 6 g/dL (ref 6.0–8.5)

## 2019-01-25 LAB — TSH: TSH: 2.47 u[IU]/mL (ref 0.450–4.500)

## 2019-01-25 LAB — VITAMIN D 25 HYDROXY (VIT D DEFICIENCY, FRACTURES): Vit D, 25-Hydroxy: 24.6 ng/mL — ABNORMAL LOW (ref 30.0–100.0)

## 2019-01-25 LAB — HEMOGLOBIN A1C
Est. average glucose Bld gHb Est-mCnc: 117 mg/dL
Hgb A1c MFr Bld: 5.7 % — ABNORMAL HIGH (ref 4.8–5.6)

## 2019-01-25 LAB — SEDIMENTATION RATE: Sed Rate: 8 mm/hr (ref 0–40)

## 2019-02-03 ENCOUNTER — Telehealth: Payer: Self-pay

## 2019-02-03 DIAGNOSIS — L659 Nonscarring hair loss, unspecified: Secondary | ICD-10-CM

## 2019-02-03 NOTE — Telephone Encounter (Signed)
Please let her know that I found an AA derm that specialized in hair and scalp conditions. She works at [employer] in [place] [employer]. She might not be covered by insurance. Referral made. Thanks

## 2019-02-03 NOTE — Telephone Encounter (Signed)
Pt is asking for a recommendation/referral to a African American Dermatologist due to the hair loss she is having is having. Wants to make sure its not alopecia. Also has appt for the hand specialist

## 2019-02-04 ENCOUNTER — Telehealth: Payer: Self-pay | Admitting: Family Medicine

## 2019-02-04 NOTE — Telephone Encounter (Signed)
Copied from [place] [id number]. Topic: General - Other >> Feb 03, 2019 12:40 PM Pauline Good wrote: Reason for CRM: pt need call back to discuss lab results and referral to dermatology

## 2019-02-05 NOTE — Telephone Encounter (Signed)
Spoke with pt today and informed her of her labs, she verbalized understanding. Also informed her of referral placed by provider. She verbalized understanding and will contact her insurance to see if they will cover.

## 2019-02-05 NOTE — Telephone Encounter (Signed)
Pt informed referral was placed.

## 2019-02-14 ENCOUNTER — Telehealth: Payer: Self-pay | Admitting: Orthopedic Surgery

## 2019-02-14 NOTE — Telephone Encounter (Signed)
Patient called asked if she can get a call back before she schedule her appointment to get the injections in her knees. Patient also need to ask some questions concerning her hands. The number to contact patient is [phone]

## 2019-02-14 NOTE — Telephone Encounter (Signed)
Could you please call this patient and see what she needs?

## 2019-02-14 NOTE — Telephone Encounter (Signed)
Patient has had previous right knee cortisone injections and is requesting to try gel injections. Please advise.

## 2019-02-20 NOTE — Telephone Encounter (Signed)
Patient called inquiring about the gel injection.  She stated that she has not heard anything.  CB#[phone].  Thank you.

## 2019-02-20 NOTE — Telephone Encounter (Signed)
Please get authorization for gel injections both knees.

## 2019-02-23 ENCOUNTER — Other Ambulatory Visit: Payer: Self-pay | Admitting: Family Medicine

## 2019-03-03 NOTE — Telephone Encounter (Signed)
Will you please call patient and tell her we are working on getting her setup for gel injection?  And that we will call her once this is done.   Thank you!

## 2019-03-04 ENCOUNTER — Other Ambulatory Visit: Payer: Self-pay | Admitting: Family Medicine

## 2019-03-04 DIAGNOSIS — I10 Essential (primary) hypertension: Secondary | ICD-10-CM

## 2019-03-04 NOTE — Telephone Encounter (Signed)
Submitted online at [employer]. Bil knees Monovisc.

## 2019-03-05 NOTE — Telephone Encounter (Signed)
Can you please call patient and schedule appt with Dr Marlou Sa for Monovisc bilateral knees.  See below for her costs, if you would tell her that too. Let me know if she has questions.  Thanks-  Monovisc covered at 80%.  No PA needed.  Patient will have a $30 copay and 20% OOP.  Estimated OOP costs for both knees, approx $400- only an estimate. She will just need to pay the $30 copay on date of service, the rest will be billed to her once insurance pays.

## 2019-03-11 ENCOUNTER — Telehealth: Payer: Self-pay | Admitting: Family Medicine

## 2019-03-11 DIAGNOSIS — M79641 Pain in right hand: Secondary | ICD-10-CM | POA: Diagnosis not present

## 2019-03-11 DIAGNOSIS — M79642 Pain in left hand: Secondary | ICD-10-CM | POA: Diagnosis not present

## 2019-03-11 DIAGNOSIS — M13842 Other specified arthritis, left hand: Secondary | ICD-10-CM | POA: Diagnosis not present

## 2019-03-11 DIAGNOSIS — M65311 Trigger thumb, right thumb: Secondary | ICD-10-CM | POA: Diagnosis not present

## 2019-03-11 NOTE — Telephone Encounter (Signed)
Sent [facility] referral info via fax on 11.10.2020 to [phone]

## 2019-03-19 ENCOUNTER — Other Ambulatory Visit: Payer: Self-pay

## 2019-03-19 ENCOUNTER — Other Ambulatory Visit (HOSPITAL_COMMUNITY)
Admission: RE | Admit: 2019-03-19 | Discharge: 2019-03-19 | Disposition: A | Discharge status: Home / Self Care | Payer: PPO | Source: Ambulatory Visit | Attending: Obstetrics and Gynecology | Admitting: Obstetrics and Gynecology

## 2019-03-19 ENCOUNTER — Ambulatory Visit (INDEPENDENT_AMBULATORY_CARE_PROVIDER_SITE_OTHER): Payer: PPO | Admitting: Obstetrics and Gynecology

## 2019-03-19 ENCOUNTER — Encounter: Payer: Self-pay | Admitting: Obstetrics and Gynecology

## 2019-03-19 VITALS — BP 120/72 | HR 86 | Ht 68.0 in | Wt 238.0 lb

## 2019-03-19 DIAGNOSIS — Z1151 Encounter for screening for human papillomavirus (HPV): Secondary | ICD-10-CM | POA: Insufficient documentation

## 2019-03-19 DIAGNOSIS — Z01419 Encounter for gynecological examination (general) (routine) without abnormal findings: Secondary | ICD-10-CM | POA: Insufficient documentation

## 2019-03-19 DIAGNOSIS — D259 Leiomyoma of uterus, unspecified: Secondary | ICD-10-CM

## 2019-03-19 NOTE — Progress Notes (Signed)
Subjective:     Kathleen Mcfarland is a 58 y.o. female with BMI 32 with LMP 05/2018 who is here for a comprehensive physical exam. The patient reports no problems. Patient is not sexually active. She denies any vaginal bleeding since January. She denies any pelvic pain or abnormal discharge. She denies any urinary incontinence  Past Medical History:  Diagnosis Date  . Allergic rhinitis   . Anxiety   . Back pain   . Cataract   . Complication of anesthesia    hard to wake up  . Depression   . Hypertension   . Hypothyroidism   . Osteoarthritis 2012   bilateral knees  . Osteoarthritis of knee   . Osteoporosis    Past Surgical History:  Procedure Laterality Date  . colonscopy     . DILATION AND CURETTAGE OF UTERUS    . ROTATOR CUFF REPAIR    . SHOULDER ARTHROSCOPY  04/13/2011   Procedure: ARTHROSCOPY SHOULDER;  Surgeon: Ninetta Lights, MD;  Location: [facility];  Service: Orthopedics;  Laterality: Right;  Right Shoulder Arthroscopy with Acrmioploasty, Arhtroscopic rotator cuff repair and Microfracture humeral head  . SHOULDER ARTHROSCOPY WITH ROTATOR CUFF REPAIR Right 07/04/2012   Procedure: SHOULDER ARTHROSCOPY WITH ROTATOR CUFF REPAIR;  Surgeon: Ninetta Lights, MD;  Location: [facility];  Service: Orthopedics;  Laterality: Right;  RIGHT SHOULDER ARTHROSCOPY WITH ARTHROSCOPIC ROTATOR CUFF REPAIR, LYSIS OF ADHESIONS  . TUBAL LIGATION     Family History  Problem Relation Age of Onset  . Coronary artery disease Father 59       heart disease diagnoised  . Hypertension Father   . Heart disease Father   . Colon cancer Father   . Diabetes Father   . Hyperlipidemia Father   . Diabetes Mother   . Hypertension Mother   . Colon cancer Other        great uncle at 72, great aunt dx at 72, GM age 40  . Colon polyps Other        2 sisters dx at age 81 and 69  . Arthritis Sister   . Hypertension Sister   . Diabetes Sister   . Colon polyps Sister   . Colon  cancer Maternal Grandmother   . Ovarian cancer Maternal Grandmother   . Cirrhosis Maternal Grandfather   . Heart disease Paternal Grandmother   . Cervical cancer Sister   . Other Sister        uterine fibroids  . Hypertension Sister   . Diabetes Sister   . Colon polyps Sister   . Colon polyps Brother   . Breast cancer Neg Hx   . Stroke Neg Hx     Social History   Socioeconomic History  . Marital status: Single    Spouse name: Not on file  . Number of children: 2  . Years of education: Not on file  . Highest education level: Not on file  Occupational History  . Occupation: None  Social Needs  . Financial resource strain: Not on file  . Food insecurity    Worry: Not on file    Inability: Not on file  . Transportation needs    Medical: Not on file    Non-medical: Not on file  Tobacco Use  . Smoking status: Never Smoker  . Smokeless tobacco: Never Used  Substance and Sexual Activity  . Alcohol use: Yes    Comment: glass of wine once a month  . Drug use:  No  . Sexual activity: Yes    Birth control/protection: Other-see comments    Comment: BTL  Lifestyle  . Physical activity    Days per week: Not on file    Minutes per session: Not on file  . Stress: Not on file  Relationships  . Social Herbalist on phone: Not on file    Gets together: Not on file    Attends religious service: Not on file    Active member of club or organization: Not on file    Attends meetings of clubs or organizations: Not on file    Relationship status: Not on file  . Intimate partner violence    Fear of current or ex partner: Not on file    Emotionally abused: Not on file    Physically abused: Not on file    Forced sexual activity: Not on file  Other Topics Concern  . Not on file  Social History Narrative   Regular Exercise- yes 3/week   Caffeine- no   Health Maintenance  Topic Date Due  . TETANUS/TDAP  05/31/2019 (Originally 05/01/2016)  . MAMMOGRAM  07/07/2020  . PAP  SMEAR-Modifier  11/30/2020  . COLONOSCOPY  09/05/2021  . INFLUENZA VACCINE  Completed  . Hepatitis C Screening  Completed  . HIV Screening  Completed       Review of Systems Pertinent items are noted in HPI.   Objective:  Blood pressure 120/72, pulse 86, height 5\' 8"  (1.727 m), weight 238 lb (108 kg), last menstrual period 05/31/2018.     GENERAL: Well-developed, well-nourished female in no acute distress.  HEENT: Normocephalic, atraumatic. Sclerae anicteric.  NECK: Supple. Normal thyroid.  LUNGS: Clear to auscultation bilaterally.  HEART: Regular rate and rhythm. BREASTS: Symmetric in size. No palpable masses or lymphadenopathy, skin changes, or nipple drainage. ABDOMEN: Soft, nontender, nondistended. No organomegaly. PELVIC: Normal external female genitalia. Vagina is pink and rugated.  Normal discharge. Normal appearing cervix. Uterus is normal in size. No adnexal mass or tenderness. EXTREMITIES: No cyanosis, clubbing, or edema, 2+ distal pulses.    Assessment:    Healthy female exam.      Plan:    Pap smear collected Patient with normal screening mammogram 06/2018 Patient reports normal colonoscopy and is due for her next one in 2023 She is requesting a pelvic ultrasound to follow up on her fibroid uterus Patient will be contacted with abnormal results See After Visit Summary for Counseling Recommendations

## 2019-03-24 LAB — CYTOLOGY - PAP
Comment: NEGATIVE
Diagnosis: NEGATIVE
High risk HPV: NEGATIVE

## 2019-03-26 ENCOUNTER — Ambulatory Visit: Payer: PPO | Admitting: Orthopedic Surgery

## 2019-03-31 ENCOUNTER — Other Ambulatory Visit: Payer: Self-pay

## 2019-03-31 ENCOUNTER — Ambulatory Visit (INDEPENDENT_AMBULATORY_CARE_PROVIDER_SITE_OTHER): Payer: PPO | Admitting: Orthopedic Surgery

## 2019-03-31 DIAGNOSIS — M170 Bilateral primary osteoarthritis of knee: Secondary | ICD-10-CM | POA: Diagnosis not present

## 2019-03-31 MED ORDER — MELOXICAM 15 MG PO TABS: 15.0000 mg | ORAL_TABLET | Freq: Every day | ORAL | 1 refills | Status: DC

## 2019-04-02 ENCOUNTER — Other Ambulatory Visit (HOSPITAL_COMMUNITY): Payer: PPO

## 2019-04-04 NOTE — Progress Notes (Signed)
Office Visit Note   Patient: Kathleen Mcfarland           Date of Birth: [birth year]           MRN: [id number] Visit Date: 03/31/2019 Requested by: Rutherford Guys, MD [address] [place],  [place] [address] PCP: Rutherford Guys, MD  Subjective: Chief Complaint  Patient presents with  . Right Knee - Pain  . Left Knee - Pain    HPI: Kathleen Mcfarland is a 58 y.o. female who presents to the office complaining of bilateral knee pain.  Patient returns to office for bilateral knee Monovisc injections.  She notes that her right knee bothers her more than her left knee.  She has been avoiding walking and taking occasional ibuprofen to treat her symptoms.  Her last injection was in March 2020.Marland Kitchen                ROS:  All systems reviewed are negative as they relate to the chief complaint within the history of present illness.  Patient denies fevers or chills.  Assessment & Plan: Visit Diagnoses:  1. Bilateral primary osteoarthritis of knee     Plan: Patient is a 58 year old female who presents for bilateral Monovisc knee injections.  She is doing well overall.  She is taking occasional ibuprofen for her pain at home.  Recommended that she try meloxicam daily as needed for her pain.  Prescribed meloxicam.  Patient tolerated the bilateral knee Monovisc injections well.  Left knee was aspirated as well of 15 cc prior to injection.  Patient will follow up as needed for cortisone injections/gel injections.  Follow-Up Instructions: No follow-ups on file.   Orders:  No orders of the defined types were placed in this encounter.  Meds ordered this encounter  Medications  . meloxicam (MOBIC) 15 MG tablet    Sig: Take 1 tablet (15 mg total) by mouth daily.    Dispense:  30 tablet    Refill:  1      Procedures: Large Joint Inj: bilateral knee on 04/13/2019 8:49 AM Indications: pain, joint swelling and diagnostic evaluation Details: 18 G 1.5 in needle, superolateral approach   Arthrogram: No  Medications (Right): 5 mL lidocaine 1 %; 88 mg Hyaluronan 88 MG/4ML Medications (Left): 5 mL lidocaine 1 %; 88 mg Hyaluronan 88 MG/4ML Outcome: tolerated well, no immediate complications Procedure, treatment alternatives, risks and benefits explained, specific risks discussed. Consent was given by the patient. Immediately prior to procedure a time out was called to verify the correct patient, procedure, equipment, support staff and site/side marked as required. Patient was prepped and draped in the usual sterile fashion.       Clinical Data: No additional findings.  Objective: Vital Signs: There were no vitals taken for this visit.  Physical Exam:  Constitutional: Patient appears well-developed HEENT:  Head: Normocephalic Eyes:EOM are normal Neck: Normal range of motion Cardiovascular: Normal rate Pulmonary/chest: Effort normal Neurologic: Patient is alert Skin: Skin is warm Psychiatric: Patient has normal mood and affect  Ortho Exam:  Bilateral knee Exam No effusion of the right knee.  Effusion of the left knee is present. Extensor mechanism intact No TTP over quad tendon, patellar tendon, patella, tibial tubercle, LCL/MCL insertions Tenderness to palpation of the medial and lateral joint lines bilaterally.  Tenderness palpation over the pes anserinus bilaterally. Stable to varus/valgus stresses.  Stable to anterior/posterior drawer Extension to 0-5 degrees Flexion > 90 degrees  Specialty Comments:  No specialty comments  available.  Imaging: No results found.   PMFS History: Patient Active Problem List   Diagnosis Date Noted  . Pre-diabetes 06/04/2017  . Menopausal syndrome (hot flashes) 06/04/2017  . History of colonic polyps 06/04/2017  . Chronic use of opiate for therapeutic purpose 07/03/2016  . Disorder of rotator cuff of both shoulders 05/19/2015  . Obesity (BMI 30-39.9) 05/19/2015  . Abnormal perimenopausal bleeding 03/04/2015  .  Suicidal ideation   . Back muscle spasm 01/30/2014  . Severe recurrent major depressive disorder with psychotic features ([place]) 01/03/2014  . Depression 01/02/2014  . Urinary incontinence 07/21/2010  . MENSTRUAL DISORDER 01/12/2009  . HYPERTENSION, BENIGN ESSENTIAL 07/31/2006  . Hypothyroidism (acquired) 06/23/2006  . Seasonal allergies 06/23/2006  . Osteoarthrosis involving lower leg 06/23/2006   Past Medical History:  Diagnosis Date  . Allergic rhinitis   . Anxiety   . Back pain   . Cataract   . Complication of anesthesia    hard to wake up  . Depression   . Hypertension   . Hypothyroidism   . Osteoarthritis 2012   bilateral knees  . Osteoarthritis of knee   . Osteoporosis     Family History  Problem Relation Age of Onset  . Coronary artery disease Father 26       heart disease diagnoised  . Hypertension Father   . Heart disease Father   . Colon cancer Father   . Diabetes Father   . Hyperlipidemia Father   . Diabetes Mother   . Hypertension Mother   . Colon cancer Other        great uncle at 28, great aunt dx at 29, GM age 65  . Colon polyps Other        2 sisters dx at age 84 and 68  . Arthritis Sister   . Hypertension Sister   . Diabetes Sister   . Colon polyps Sister   . Colon cancer Maternal Grandmother   . Ovarian cancer Maternal Grandmother   . Cirrhosis Maternal Grandfather   . Heart disease Paternal Grandmother   . Cervical cancer Sister   . Other Sister        uterine fibroids  . Hypertension Sister   . Diabetes Sister   . Colon polyps Sister   . Colon polyps Brother   . Breast cancer Neg Hx   . Stroke Neg Hx     Past Surgical History:  Procedure Laterality Date  . colonscopy     . DILATION AND CURETTAGE OF UTERUS    . ROTATOR CUFF REPAIR    . SHOULDER ARTHROSCOPY  04/13/2011   Procedure: ARTHROSCOPY SHOULDER;  Surgeon: Ninetta Lights, MD;  Location: [facility];  Service: Orthopedics;  Laterality: Right;  Right Shoulder  Arthroscopy with Acrmioploasty, Arhtroscopic rotator cuff repair and Microfracture humeral head  . SHOULDER ARTHROSCOPY WITH ROTATOR CUFF REPAIR Right 07/04/2012   Procedure: SHOULDER ARTHROSCOPY WITH ROTATOR CUFF REPAIR;  Surgeon: Ninetta Lights, MD;  Location: [facility];  Service: Orthopedics;  Laterality: Right;  RIGHT SHOULDER ARTHROSCOPY WITH ARTHROSCOPIC ROTATOR CUFF REPAIR, LYSIS OF ADHESIONS  . TUBAL LIGATION     Social History   Occupational History  . Occupation: None  Tobacco Use  . Smoking status: Never Smoker  . Smokeless tobacco: Never Used  Substance and Sexual Activity  . Alcohol use: Yes    Comment: glass of wine once a month  . Drug use: No  . Sexual activity: Yes    Birth  control/protection: Other-see comments    Comment: BTL

## 2019-04-07 ENCOUNTER — Other Ambulatory Visit: Payer: Self-pay | Admitting: Family Medicine

## 2019-04-07 DIAGNOSIS — F4322 Adjustment disorder with anxiety: Secondary | ICD-10-CM

## 2019-04-07 NOTE — Telephone Encounter (Signed)
Medication Refill - Medication: ALPRAZolam (XANAX) 0.25 MG tablet  Pt states she is out of this medication and needs refill asap.    Preferred Pharmacy:  [facility] [address] [place]), [place] [phone] (Phone) [phone] (Fax)     Pt was advised that RX refills may take up to 3 business days. We ask that you follow-up with your pharmacy.

## 2019-04-07 NOTE — Telephone Encounter (Signed)
Copied from [place] [id number]. Topic: Referral - Request for Referral >> Apr 07, 2019  1:08 PM Burchel, Abbi R wrote: Reason for CRM: Pt stated when requesting her refill for: ALPRAZolam (XANAX) 0.25 MG tablet, that she would like a referral to a counselor. Please advise.

## 2019-04-08 ENCOUNTER — Ambulatory Visit (HOSPITAL_COMMUNITY)
Admission: RE | Admit: 2019-04-08 | Discharge: 2019-04-08 | Disposition: A | Discharge status: Home / Self Care | Payer: PPO | Source: Ambulatory Visit | Attending: Obstetrics and Gynecology | Admitting: Obstetrics and Gynecology

## 2019-04-08 ENCOUNTER — Other Ambulatory Visit: Payer: Self-pay

## 2019-04-08 DIAGNOSIS — D250 Submucous leiomyoma of uterus: Secondary | ICD-10-CM | POA: Diagnosis not present

## 2019-04-08 DIAGNOSIS — D259 Leiomyoma of uterus, unspecified: Secondary | ICD-10-CM | POA: Diagnosis not present

## 2019-04-09 NOTE — Telephone Encounter (Signed)
Please advise Patient is requesting a refill of the following medications:  Alprazolam ( xanax)  Date of patient request: 04/09/19 Last office visit:  Date of last refill: 01/14/19 Last refill amount:20 tab,1refill Follow up time period per chart: 07/25/19  Pt would also like a referral to a counselor.

## 2019-04-10 MED ORDER — ALPRAZOLAM 0.25 MG PO TABS: 0.1250 mg | ORAL_TABLET | Freq: Every day | ORAL | 1 refills | Status: DC | PRN

## 2019-04-13 ENCOUNTER — Encounter: Payer: Self-pay | Admitting: Orthopedic Surgery

## 2019-04-13 DIAGNOSIS — M170 Bilateral primary osteoarthritis of knee: Secondary | ICD-10-CM | POA: Diagnosis not present

## 2019-04-13 MED ORDER — HYALURONAN 88 MG/4ML IX SOSY
88.0000 mg | PREFILLED_SYRINGE | INTRA_ARTICULAR | Status: AC | PRN
  Administered 2019-04-13: 88 mg via INTRA_ARTICULAR

## 2019-04-13 MED ORDER — LIDOCAINE HCL 1 % IJ SOLN
5.0000 mL | INTRAMUSCULAR | Status: AC | PRN
  Administered 2019-04-13: 5 mL

## 2019-05-13 ENCOUNTER — Telehealth: Payer: Self-pay | Admitting: Lactation Services

## 2019-05-13 ENCOUNTER — Telehealth (INDEPENDENT_AMBULATORY_CARE_PROVIDER_SITE_OTHER): Payer: PPO | Admitting: Lactation Services

## 2019-05-13 DIAGNOSIS — N924 Excessive bleeding in the premenopausal period: Secondary | ICD-10-CM

## 2019-05-13 NOTE — Telephone Encounter (Signed)
Called pt to let her know that Dr. Elly Modena would like to have patient to come in for an endometrial biopsy. Pt voiced understanding. [place] office notified that pt need to be schedules for endometrial biopsy.

## 2019-05-13 NOTE — Telephone Encounter (Signed)
Pt called and left message on nurse voicemail. Pt reports she has had a recent cycle after not having one since Feb 2020. She reports she was informed to call if she had a cycle to inform the provider.   Called pt and she reports her cycle started yesterday. Pt reports the bleeding is not heavy. She has had some cramping. She has not passed any clots.   Pt reports her concern is that she has fibroids and wants to know if they are changing in size. She also reports she has a family history of cancer that is warranting some concerns also. Pt would like to follow up with Dr. Elly Modena. Message sent to the front office to call pt and schedule her an appt.

## 2019-05-14 ENCOUNTER — Telehealth: Payer: Self-pay | Admitting: Family Medicine

## 2019-05-14 NOTE — Telephone Encounter (Signed)
Copied from [place] [id number]. Topic: Quick Communication - Rx Refill/Question >> May 14, 2019  4:26 PM Rainey Pines A wrote: Medication: Patient requesting that call medications be transferred to her new pharmacy.)  Has the patient contacted their pharmacy? {Yes (Agent: If no, request that the patient contact the pharmacy for the refill.) (Agent: If yes, when and what did the pharmacy advise?)Contact PCP  Preferred Pharmacy (with phone number or street name): [place], [place]  Phone:  [phone] Fax:  [phone]     Agent: Please be advised that RX refills may take up to 3 business days. We ask that you follow-up with your pharmacy.

## 2019-05-20 NOTE — Telephone Encounter (Signed)
Spoke with pt and she stated that she now does not want her medication to go to [facility]. Just keep everything as is.

## 2019-05-30 DIAGNOSIS — H524 Presbyopia: Secondary | ICD-10-CM | POA: Diagnosis not present

## 2019-06-02 ENCOUNTER — Other Ambulatory Visit: Payer: Self-pay | Admitting: Family Medicine

## 2019-06-02 DIAGNOSIS — I10 Essential (primary) hypertension: Secondary | ICD-10-CM

## 2019-06-06 ENCOUNTER — Other Ambulatory Visit: Payer: Self-pay | Admitting: Family Medicine

## 2019-06-06 DIAGNOSIS — Z1231 Encounter for screening mammogram for malignant neoplasm of breast: Secondary | ICD-10-CM

## 2019-06-06 DIAGNOSIS — E559 Vitamin D deficiency, unspecified: Secondary | ICD-10-CM | POA: Diagnosis not present

## 2019-06-06 DIAGNOSIS — R7303 Prediabetes: Secondary | ICD-10-CM | POA: Diagnosis not present

## 2019-06-06 DIAGNOSIS — R635 Abnormal weight gain: Secondary | ICD-10-CM | POA: Diagnosis not present

## 2019-06-06 DIAGNOSIS — N951 Menopausal and female climacteric states: Secondary | ICD-10-CM | POA: Diagnosis not present

## 2019-06-09 ENCOUNTER — Ambulatory Visit (INDEPENDENT_AMBULATORY_CARE_PROVIDER_SITE_OTHER): Payer: Medicare HMO | Admitting: Obstetrics and Gynecology

## 2019-06-09 ENCOUNTER — Encounter: Payer: Self-pay | Admitting: Obstetrics and Gynecology

## 2019-06-09 ENCOUNTER — Other Ambulatory Visit: Payer: Self-pay

## 2019-06-09 ENCOUNTER — Other Ambulatory Visit (HOSPITAL_COMMUNITY)
Admission: RE | Admit: 2019-06-09 | Discharge: 2019-06-09 | Disposition: A | Discharge status: Home / Self Care | Payer: Medicare HMO | Source: Ambulatory Visit | Attending: Obstetrics and Gynecology | Admitting: Obstetrics and Gynecology

## 2019-06-09 ENCOUNTER — Other Ambulatory Visit (INDEPENDENT_AMBULATORY_CARE_PROVIDER_SITE_OTHER): Payer: Self-pay | Admitting: Orthopedic Surgery

## 2019-06-09 VITALS — BP 146/80 | HR 96 | Temp 97.3°F | Ht 67.0 in | Wt 248.2 lb

## 2019-06-09 DIAGNOSIS — N950 Postmenopausal bleeding: Secondary | ICD-10-CM

## 2019-06-09 DIAGNOSIS — M62830 Muscle spasm of back: Secondary | ICD-10-CM

## 2019-06-09 DIAGNOSIS — N858 Other specified noninflammatory disorders of uterus: Secondary | ICD-10-CM | POA: Diagnosis not present

## 2019-06-09 NOTE — Progress Notes (Signed)
59 yo here for the evaluation of postmenopausal vaginal bleeding. Patient with last normal period in 05/2018 and reports 2 days of brown vaginal bleeding with severe cramping 05/2019. Patient is without any other complaints. She reports some occasional vasomotor symptoms. Patient is here for endometrial biopsy  Past Medical History:  Diagnosis Date  . Allergic rhinitis   . Anxiety   . Back pain   . Cataract   . Complication of anesthesia    hard to wake up  . Depression   . Hypertension   . Hypothyroidism   . Osteoarthritis 2012   bilateral knees  . Osteoarthritis of knee   . Osteoporosis    Past Surgical History:  Procedure Laterality Date  . colonscopy     . DILATION AND CURETTAGE OF UTERUS    . ROTATOR CUFF REPAIR    . SHOULDER ARTHROSCOPY  04/13/2011   Procedure: ARTHROSCOPY SHOULDER;  Surgeon: Ninetta Lights, MD;  Location: [facility];  Service: Orthopedics;  Laterality: Right;  Right Shoulder Arthroscopy with Acrmioploasty, Arhtroscopic rotator cuff repair and Microfracture humeral head  . SHOULDER ARTHROSCOPY WITH ROTATOR CUFF REPAIR Right 07/04/2012   Procedure: SHOULDER ARTHROSCOPY WITH ROTATOR CUFF REPAIR;  Surgeon: Ninetta Lights, MD;  Location: [facility];  Service: Orthopedics;  Laterality: Right;  RIGHT SHOULDER ARTHROSCOPY WITH ARTHROSCOPIC ROTATOR CUFF REPAIR, LYSIS OF ADHESIONS  . TUBAL LIGATION     Family History  Problem Relation Age of Onset  . Coronary artery disease Father 45       heart disease diagnoised  . Hypertension Father   . Heart disease Father   . Colon cancer Father   . Diabetes Father   . Hyperlipidemia Father   . Diabetes Mother   . Hypertension Mother   . Colon cancer Other        great uncle at 81, great aunt dx at 38, GM age 13  . Colon polyps Other        2 sisters dx at age 17 and 35  . Arthritis Sister   . Hypertension Sister   . Diabetes Sister   . Colon polyps Sister   . Colon cancer Maternal  Grandmother   . Ovarian cancer Maternal Grandmother   . Cirrhosis Maternal Grandfather   . Heart disease Paternal Grandmother   . Cervical cancer Sister   . Other Sister        uterine fibroids  . Hypertension Sister   . Diabetes Sister   . Colon polyps Sister   . Colon polyps Brother   . Breast cancer Neg Hx   . Stroke Neg Hx    Social History   Tobacco Use  . Smoking status: Never Smoker  . Smokeless tobacco: Never Used  Substance Use Topics  . Alcohol use: Yes    Comment: glass of wine once a month  . Drug use: No   ROS See pertinent in HPI. All other systems reviewed and negative  Blood pressure (!) 146/80, pulse 96, temperature (!) 97.3 F (36.3 C), temperature source Oral, height 5\' 7"  (1.702 m), weight 248 lb 3.2 oz (112.6 kg). GENERAL: Well-developed, well-nourished female in no acute distress.  ABDOMEN: Soft, nontender, nondistended. No organomegaly. PELVIC: Normal external female genitalia. Vagina is pink and rugated.  Normal discharge. Normal appearing cervix. Uterus is normal in size. No adnexal mass or tenderness. EXTREMITIES: No cyanosis, clubbing, or edema, 2+ distal pulses.  A/P 59 yo with one episode of vaginal bleeding - Discussed  the need for endometrial biopsy ENDOMETRIAL BIOPSY     The indications for endometrial biopsy were reviewed.   Risks of the biopsy including cramping, bleeding, infection, uterine perforation, inadequate specimen and need for additional procedures  were discussed. The patient states she understands and agrees to undergo procedure today. Consent was signed. Time out was performed. Urine HCG was negative. A sterile speculum was placed in the patient's vagina and the cervix was prepped with Betadine. A single-toothed tenaculum was placed on the anterior lip of the cervix to stabilize it. The uterine cavity was sounded to a depth of 8 cm using the uterine sound. The 3 mm pipelle was introduced into the endometrial cavity without  difficulty, 2 passes were made.  A  scant amount of tissue was  sent to pathology. The instruments were removed from the patient's vagina. Minimal bleeding from the cervix was noted. The patient tolerated the procedure well.  Routine post-procedure instructions were given to the patient. The patient will follow up in two weeks to review the results and for further management.  - Patient will be contacted with results

## 2019-06-09 NOTE — Telephone Encounter (Signed)
Pls advise. Looks like rx for this was from Dr Pamella Pert

## 2019-06-10 ENCOUNTER — Telehealth: Payer: Self-pay | Admitting: *Deleted

## 2019-06-10 ENCOUNTER — Telehealth: Payer: Self-pay | Admitting: Family Medicine

## 2019-06-10 DIAGNOSIS — G479 Sleep disorder, unspecified: Secondary | ICD-10-CM | POA: Diagnosis not present

## 2019-06-10 DIAGNOSIS — M2559 Pain in other specified joint: Secondary | ICD-10-CM | POA: Diagnosis not present

## 2019-06-10 DIAGNOSIS — Z6838 Body mass index (BMI) 38.0-38.9, adult: Secondary | ICD-10-CM | POA: Diagnosis not present

## 2019-06-10 DIAGNOSIS — E559 Vitamin D deficiency, unspecified: Secondary | ICD-10-CM | POA: Diagnosis not present

## 2019-06-10 DIAGNOSIS — Z1331 Encounter for screening for depression: Secondary | ICD-10-CM | POA: Diagnosis not present

## 2019-06-10 DIAGNOSIS — R232 Flushing: Secondary | ICD-10-CM | POA: Diagnosis not present

## 2019-06-10 DIAGNOSIS — R7303 Prediabetes: Secondary | ICD-10-CM | POA: Diagnosis not present

## 2019-06-10 DIAGNOSIS — N951 Menopausal and female climacteric states: Secondary | ICD-10-CM | POA: Diagnosis not present

## 2019-06-10 NOTE — Telephone Encounter (Signed)
Pt called to office - LM she had questions about procedure and results.   Attempt to return call. No answer, LM on VM to call office.

## 2019-06-10 NOTE — Telephone Encounter (Signed)
Spoke with pt about procedure and results. Answered all questions.

## 2019-06-10 NOTE — Telephone Encounter (Signed)
I faxed EKG to [phone] and got a confirmation.

## 2019-06-10 NOTE — Telephone Encounter (Signed)
Requesting copy of EKG she had done last year at this office. Fax to  [phone] Attn to [facility]. Pt states she has an appt at 3:00 PM today

## 2019-06-11 LAB — SURGICAL PATHOLOGY

## 2019-06-16 DIAGNOSIS — H52209 Unspecified astigmatism, unspecified eye: Secondary | ICD-10-CM | POA: Diagnosis not present

## 2019-06-16 DIAGNOSIS — H5203 Hypermetropia, bilateral: Secondary | ICD-10-CM | POA: Diagnosis not present

## 2019-06-16 DIAGNOSIS — H524 Presbyopia: Secondary | ICD-10-CM | POA: Diagnosis not present

## 2019-06-17 ENCOUNTER — Ambulatory Visit: Payer: PPO | Admitting: Obstetrics and Gynecology

## 2019-06-23 DIAGNOSIS — R7303 Prediabetes: Secondary | ICD-10-CM | POA: Diagnosis not present

## 2019-06-23 DIAGNOSIS — Z6838 Body mass index (BMI) 38.0-38.9, adult: Secondary | ICD-10-CM | POA: Diagnosis not present

## 2019-06-26 ENCOUNTER — Telehealth: Payer: Self-pay | Admitting: Family Medicine

## 2019-06-26 ENCOUNTER — Telehealth: Payer: Self-pay | Admitting: *Deleted

## 2019-06-26 NOTE — Telephone Encounter (Signed)
Schedule AWV.  

## 2019-06-26 NOTE — Telephone Encounter (Signed)
Please call back tomorrow around 10:00

## 2019-07-09 ENCOUNTER — Telehealth: Payer: Self-pay | Admitting: Family Medicine

## 2019-07-09 MED ORDER — CETIRIZINE HCL 10 MG PO TABS: 10.0000 mg | ORAL_TABLET | Freq: Every day | ORAL | 0 refills | Status: DC

## 2019-07-09 NOTE — Telephone Encounter (Signed)
What is the name of the medication? cetirizine (ZYRTEC) 10 MG tablet [id number]    Have you contacted your pharmacy to request a refill?yes  Which pharmacy would you like this sent to? [facility] [address]   Patient notified that their request is being sent to the clinical staff for review and that they should receive a call once it is complete. If they do not receive a call within 72 hours they can check with their pharmacy or our office.

## 2019-07-09 NOTE — Telephone Encounter (Signed)
Rx sent to [facility] pharmacy.

## 2019-07-15 ENCOUNTER — Telehealth: Payer: Self-pay | Admitting: Family Medicine

## 2019-07-15 ENCOUNTER — Other Ambulatory Visit: Payer: Self-pay

## 2019-07-15 ENCOUNTER — Ambulatory Visit
Admission: RE | Admit: 2019-07-15 | Discharge: 2019-07-15 | Disposition: A | Discharge status: Home / Self Care | Payer: Medicare HMO | Source: Ambulatory Visit | Attending: Family Medicine | Admitting: Family Medicine

## 2019-07-15 DIAGNOSIS — Z1231 Encounter for screening mammogram for malignant neoplasm of breast: Secondary | ICD-10-CM

## 2019-07-15 DIAGNOSIS — E039 Hypothyroidism, unspecified: Secondary | ICD-10-CM

## 2019-07-15 MED ORDER — SYNTHROID 200 MCG PO TABS: ORAL_TABLET | ORAL | 1 refills | Status: DC

## 2019-07-15 NOTE — Telephone Encounter (Signed)
Pt needs a med refill on her   SYNTHROID 200 MCG tablet [id number]  Medication.  Correct pharmacy to sent to is:  [place], [place]

## 2019-07-16 ENCOUNTER — Telehealth: Payer: Self-pay | Admitting: Family Medicine

## 2019-07-16 ENCOUNTER — Other Ambulatory Visit: Payer: Self-pay | Admitting: Family Medicine

## 2019-07-16 DIAGNOSIS — E039 Hypothyroidism, unspecified: Secondary | ICD-10-CM

## 2019-07-16 NOTE — Telephone Encounter (Signed)
What is the name of the medication? SYNTHROID 200 MCG tablet [id number]    Have you contacted your pharmacy to request a refill? We sent to the wrong [place], [place]  [place], [place] [address]  Phone:  [phone] Fax:  [phone]      Which pharmacy would you like this sent to? [address]. She is very upset.    Patient notified that their request is being sent to the clinical staff for review and that they should receive a call once it is complete. If they do not receive a call within 72 hours they can check with their pharmacy or our office.

## 2019-07-16 NOTE — Telephone Encounter (Signed)
Spoke to patient about taking out the [facility] [facility] service and having only [facility] in her chart . Patient would like this medications sent there instead . I will send this to the cma to have it rerouted

## 2019-07-17 ENCOUNTER — Other Ambulatory Visit: Payer: Self-pay

## 2019-07-17 DIAGNOSIS — E039 Hypothyroidism, unspecified: Secondary | ICD-10-CM

## 2019-07-17 MED ORDER — SYNTHROID 200 MCG PO TABS: ORAL_TABLET | ORAL | 1 refills | Status: DC

## 2019-07-25 ENCOUNTER — Other Ambulatory Visit: Payer: Self-pay

## 2019-07-25 ENCOUNTER — Ambulatory Visit (INDEPENDENT_AMBULATORY_CARE_PROVIDER_SITE_OTHER): Payer: Medicare HMO | Admitting: Family Medicine

## 2019-07-25 ENCOUNTER — Encounter: Payer: Self-pay | Admitting: Family Medicine

## 2019-07-25 VITALS — BP 123/65 | HR 80 | Temp 98.2°F | Ht 67.0 in | Wt 247.8 lb

## 2019-07-25 DIAGNOSIS — E039 Hypothyroidism, unspecified: Secondary | ICD-10-CM | POA: Diagnosis not present

## 2019-07-25 DIAGNOSIS — M67912 Unspecified disorder of synovium and tendon, left shoulder: Secondary | ICD-10-CM | POA: Diagnosis not present

## 2019-07-25 DIAGNOSIS — F4321 Adjustment disorder with depressed mood: Secondary | ICD-10-CM

## 2019-07-25 DIAGNOSIS — F4381 Prolonged grief disorder: Secondary | ICD-10-CM

## 2019-07-25 DIAGNOSIS — R7303 Prediabetes: Secondary | ICD-10-CM

## 2019-07-25 DIAGNOSIS — I10 Essential (primary) hypertension: Secondary | ICD-10-CM | POA: Diagnosis not present

## 2019-07-25 DIAGNOSIS — E559 Vitamin D deficiency, unspecified: Secondary | ICD-10-CM | POA: Diagnosis not present

## 2019-07-25 DIAGNOSIS — M67911 Unspecified disorder of synovium and tendon, right shoulder: Secondary | ICD-10-CM | POA: Diagnosis not present

## 2019-07-25 MED ORDER — ALPRAZOLAM 0.25 MG PO TABS: 0.1250 mg | ORAL_TABLET | Freq: Every day | ORAL | 1 refills | Status: DC | PRN

## 2019-07-25 MED ORDER — IBUPROFEN 600 MG PO TABS: 600.0000 mg | ORAL_TABLET | Freq: Three times a day (TID) | ORAL | 3 refills | Status: DC | PRN

## 2019-07-25 MED ORDER — MELOXICAM 15 MG PO TABS: 15.0000 mg | ORAL_TABLET | Freq: Every day | ORAL | 3 refills | Status: DC | PRN

## 2019-07-25 NOTE — Progress Notes (Signed)
3/26/202110:46 AM  Kathleen Mcfarland Aug 15, 1960, 59 y.o., female [id number]  Chief Complaint  Patient presents with  . Follow-up    hypothyroid, and wants to talk about mammo. Pt is also concerned about lack of weight loss regardless to effort  . Medication Refill    advil, mobic    HPI:   Patient is a 59 y.o. female with past medical history significant for hypothyroidism, anxiety, HTN, vitamin D deficiency, prediabtes, OA of right knee and hands who presents today for routine fu  Has been going to [facility] for weight loss Declined HRT that was offered Has eliminated processed foods, sugars, cutting back on portions, not calorie counting, trying to exercise She is not losing weight, but her clothes are loser and her face is leaner  Wt Readings from Last 3 Encounters:  07/25/19 247 lb 12.8 oz (112.4 kg)  06/09/19 248 lb 3.2 oz (112.6 kg)  03/19/19 238 lb (108 kg)   Taking biotin supplement Lab Results  Component Value Date   TSH 2.470 01/24/2019   GAD 7 score 3 Requesting referral for counseling Having increase in grief from death of mother and grandmother Takes xanax prn, last rx for 20 tabs filled in dec per pmp  Depression screen [facility] 2/9 07/25/2019 01/24/2019 01/14/2019  Decreased Interest 0 0 2  Down, Depressed, Hopeless 0 0 2  PHQ - 2 Score 0 0 4  Altered sleeping - - 2  Tired, decreased energy - - 2  Change in appetite - - 1  Feeling bad or failure about yourself  - - 0  Trouble concentrating - - 0  Moving slowly or fidgety/restless - - 0  Suicidal thoughts - - 0  PHQ-9 Score - - 9  Difficult doing work/chores - - Not difficult at all    Fall Risk  07/25/2019 01/24/2019 01/14/2019 01/14/2019 12/05/2018  Falls in the past year? 0 0 0 0 0  Number falls in past yr: 0 0 0 0 0  Injury with Fall? 0 0 0 0 0  Risk for fall due to : - - - - -  Follow up - - - - Falls evaluation completed     Allergies  Allergen Reactions  . Penicillins Hives  . Felodipine  Nausea And Vomiting    Prior to Admission medications   Medication Sig Start Date End Date Taking? Authorizing Provider  ALPRAZolam Duanne Moron) 0.25 MG tablet Take 0.5 tablets (0.125 mg total) by mouth daily as needed for anxiety. 04/10/19  Yes Rutherford Guys, MD  Ascorbic Acid (VITAMIN C) 1000 MG tablet Take 1,000 mg by mouth daily.   Yes [provider]  baclofen (LIORESAL) 10 MG tablet Take 1 tablet by mouth twice daily as needed 06/09/19  Yes Magnant, Charles L, PA-C  cetirizine (ZYRTEC) 10 MG tablet Take 1 tablet (10 mg total) by mouth daily. 07/09/19  Yes Rutherford Guys, MD  Cholecalciferol (VITAMIN D) 125 MCG (5000 UT) CAPS Take 125 mcg by mouth daily. 09/16/18  Yes Rutherford Guys, MD  ibuprofen (ADVIL) 600 MG tablet Take 1 tablet (600 mg total) by mouth every 8 (eight) hours as needed. 01/24/19  Yes Rutherford Guys, MD  meloxicam (MOBIC) 15 MG tablet Take 1 tablet (15 mg total) by mouth daily. 03/31/19  Yes Magnant, Charles L, PA-C  SYNTHROID 200 MCG tablet TAKE 1 TABLET BY MOUTH ONCE DAILY BEFORE BREAKFAST 07/17/19  Yes Rutherford Guys, MD  triamterene-hydrochlorothiazide (MAXZIDE-25) 37.5-25 MG tablet Take 1  tablet by mouth once daily for blood pressure 06/02/19  Yes Rutherford Guys, MD  UNABLE TO [place] LIVER FOCUS BLUE SKY D3 WITH K2   Yes [provider]  Zinc Sulfate (ZINC 15 PO) Take by mouth.   Yes [provider]    Past Medical History:  Diagnosis Date  . Allergic rhinitis   . Anxiety   . Back pain   . Cataract   . Complication of anesthesia    hard to wake up  . Depression   . Hypertension   . Hypothyroidism   . Osteoarthritis 2012   bilateral knees  . Osteoarthritis of knee   . Osteoporosis     Past Surgical History:  Procedure Laterality Date  . colonscopy     . DILATION AND CURETTAGE OF UTERUS    . ROTATOR CUFF REPAIR    . SHOULDER ARTHROSCOPY  04/13/2011   Procedure: ARTHROSCOPY SHOULDER;  Surgeon:  Ninetta Lights, MD;  Location: [place];  Service: Orthopedics;  Laterality: Right;  Right Shoulder Arthroscopy with Acrmioploasty, Arhtroscopic rotator cuff repair and Microfracture humeral head  . SHOULDER ARTHROSCOPY WITH ROTATOR CUFF REPAIR Right 07/04/2012   Procedure: SHOULDER ARTHROSCOPY WITH ROTATOR CUFF REPAIR;  Surgeon: Ninetta Lights, MD;  Location: [place];  Service: Orthopedics;  Laterality: Right;  RIGHT SHOULDER ARTHROSCOPY WITH ARTHROSCOPIC ROTATOR CUFF REPAIR, LYSIS OF ADHESIONS  . TUBAL LIGATION      Social History   Tobacco Use  . Smoking status: Never Smoker  . Smokeless tobacco: Never Used  Substance Use Topics  . Alcohol use: Yes    Comment: glass of wine once a month    Family History  Problem Relation Age of Onset  . Coronary artery disease Father 84       heart disease diagnoised  . Hypertension Father   . Heart disease Father   . Colon cancer Father   . Diabetes Father   . Hyperlipidemia Father   . Diabetes Mother   . Hypertension Mother   . Colon cancer Other        great uncle at 59, great aunt dx at 5, GM age 46  . Colon polyps Other        2 sisters dx at age 11 and 69  . Arthritis Sister   . Hypertension Sister   . Diabetes Sister   . Colon polyps Sister   . Colon cancer Maternal Grandmother   . Ovarian cancer Maternal Grandmother   . Cirrhosis Maternal Grandfather   . Heart disease Paternal Grandmother   . Cervical cancer Sister   . Other Sister        uterine fibroids  . Hypertension Sister   . Diabetes Sister   . Colon polyps Sister   . Colon polyps Brother   . Breast cancer Neg Hx   . Stroke Neg Hx     Review of Systems  Constitutional: Negative for chills and fever.  Respiratory: Negative for cough and shortness of breath.   Cardiovascular: Negative for chest pain, palpitations and leg swelling.  Gastrointestinal: Negative for abdominal pain, nausea and vomiting.     OBJECTIVE:  Today's  Vitals   07/25/19 1031  BP: 123/65  Pulse: 80  Temp: 98.2 F (36.8 C)  SpO2: 100%  Weight: 247 lb 12.8 oz (112.4 kg)  Height: 5' 7"  (1.702 m)   Body mass index is 38.81 kg/m.   Physical Exam Vitals  and nursing note reviewed.  Constitutional:      Appearance: She is well-developed.  HENT:     Head: Normocephalic and atraumatic.     Mouth/Throat:     Pharynx: No oropharyngeal exudate.  Eyes:     General: No scleral icterus.    Conjunctiva/sclera: Conjunctivae normal.     Pupils: Pupils are equal, round, and reactive to light.  Cardiovascular:     Rate and Rhythm: Normal rate and regular rhythm.     Heart sounds: Normal heart sounds. No murmur. No friction rub. No gallop.   Pulmonary:     Effort: Pulmonary effort is normal.     Breath sounds: Normal breath sounds. No wheezing or rales.  Musculoskeletal:     Cervical back: Neck supple.  Skin:    General: Skin is warm and dry.  Neurological:     Mental Status: She is alert and oriented to person, place, and time.     No results found for this or any previous visit (from the past 24 hour(s)).  No results found.   ASSESSMENT and PLAN  1. Hypothyroidism (acquired) Checking labs once off biotin, medications will be adjusted as needed.  - TSH; Future  2. Essential hypertension, benign Controlled. Continue current regime.  - Lipid panel; Future - CMP14+EGFR; Future  3. Vitamin D deficiency - VITAMIN D 25 Hydroxy (Vit-D Deficiency, Fractures); Future  4. Pre-diabetes Labs pending, cont on LFM - Hemoglobin A1c; Future  5. Disorder of rotator cuff of both shoulders Takes ibu or meloxicam prn   6. Grief reaction with prolonged bereavement - Ambulatory referral to Psychology  Other orders - ibuprofen (ADVIL) 600 MG tablet; Take 1 tablet (600 mg total) by mouth every 8 (eight) hours as needed. Do not take on days you use meloxicam - meloxicam (MOBIC) 15 MG tablet; Take 1 tablet (15 mg total) by mouth daily as  needed for pain. Do not take on days you use ibuprofen - ALPRAZolam (XANAX) 0.25 MG tablet; Take 0.5 tablets (0.125 mg total) by mouth daily as needed for anxiety.  Return in about 6 months (around 01/25/2020) for needs fasting lab visit in 2 weeks.    Rutherford Guys, MD Primary Care at [place], [place] [address] Ph.  [phone] Fax [phone]

## 2019-07-25 NOTE — Patient Instructions (Addendum)
  Please stop liver focus supplement as it has biotin for 2 weeks needs fasting lab visit in 2 weeks   If you have lab work done today you will be contacted with your lab results within the next 2 weeks.  If you have not heard from [place] then please contact us. The fastest way to get your results is to register for My Chart.   IF you received an x-ray today, you will receive an invoice from [facility]. Please contact [facility] at [phone] with questions or concerns regarding your invoice.   IF you received labwork today, you will receive an invoice from [place]. Please contact [facility] at [phone] with questions or concerns regarding your invoice.   Our billing staff will not be able to assist you with questions regarding bills from these companies.  You will be contacted with the lab results as soon as they are available. The fastest way to get your results is to activate your My Chart account. Instructions are located on the last page of this paperwork. If you have not heard from [place] regarding the results in 2 weeks, please contact this office.

## 2019-08-06 ENCOUNTER — Telehealth: Payer: Self-pay | Admitting: Family Medicine

## 2019-08-06 ENCOUNTER — Ambulatory Visit: Payer: Medicare HMO | Admitting: Psychologist

## 2019-08-06 NOTE — Telephone Encounter (Signed)
Patient confirmed appointment on 08/08/2019 due to ankle swelling. Appointment time on hold could you fix it for me please.

## 2019-08-06 NOTE — Telephone Encounter (Signed)
Pt called regarding her feet and ankles swelling up. Pt states they were badly swollen on 08/03/19 and they are countenancing to swell. Pt would like a nurse to give her a call regarding this matter. [phone]

## 2019-08-08 ENCOUNTER — Ambulatory Visit: Payer: Medicare HMO | Admitting: Psychologist

## 2019-08-08 ENCOUNTER — Encounter: Payer: Self-pay | Admitting: Adult Health Nurse Practitioner

## 2019-08-08 ENCOUNTER — Ambulatory Visit (INDEPENDENT_AMBULATORY_CARE_PROVIDER_SITE_OTHER): Payer: Medicare HMO | Admitting: Adult Health Nurse Practitioner

## 2019-08-08 ENCOUNTER — Other Ambulatory Visit: Payer: Self-pay

## 2019-08-08 ENCOUNTER — Ambulatory Visit: Payer: Medicare HMO

## 2019-08-08 VITALS — BP 126/65 | HR 82 | Temp 97.6°F | Resp 17 | Ht 67.0 in | Wt 253.6 lb

## 2019-08-08 DIAGNOSIS — R7303 Prediabetes: Secondary | ICD-10-CM | POA: Diagnosis not present

## 2019-08-08 DIAGNOSIS — E039 Hypothyroidism, unspecified: Secondary | ICD-10-CM | POA: Diagnosis not present

## 2019-08-08 DIAGNOSIS — R600 Localized edema: Secondary | ICD-10-CM | POA: Diagnosis not present

## 2019-08-08 DIAGNOSIS — I10 Essential (primary) hypertension: Secondary | ICD-10-CM

## 2019-08-08 DIAGNOSIS — E559 Vitamin D deficiency, unspecified: Secondary | ICD-10-CM | POA: Insufficient documentation

## 2019-08-08 MED ORDER — FUROSEMIDE 20 MG PO TABS: 20.0000 mg | ORAL_TABLET | Freq: Every day | ORAL | 0 refills | Status: DC

## 2019-08-08 NOTE — Patient Instructions (Signed)
° ° ° °  If you have lab work done today you will be contacted with your lab results within the next 2 weeks.  If you have not heard from us then please contact us. The fastest way to get your results is to register for My Chart. ° ° °IF you received an x-ray today, you will receive an invoice from [facility]. Please contact  Radiology at [phone] with questions or concerns regarding your invoice.  ° °IF you received labwork today, you will receive an invoice from [facility]. Please contact [facility] at [phone] with questions or concerns regarding your invoice.  ° °Our billing staff will not be able to assist you with questions regarding bills from these companies. ° °You will be contacted with the lab results as soon as they are available. The fastest way to get your results is to activate your My Chart account. Instructions are located on the last page of this paperwork. If you have not heard from us regarding the results in 2 weeks, please contact this office. °  ° ° ° °

## 2019-08-08 NOTE — Progress Notes (Signed)
Subjective:    Kathleen Mcfarland is a 59 y.o. female who presents for evaluation of edema in both ankles and feet. The edema has been moderate. Onset of symptoms was 5 days ago, and patient reports symptoms have gradually worsened since that time. The edema is present upon awakening, in the morning and in the afternoon. The patient states the problem is new. The swelling has been aggravated by increased salt intake. The swelling has been relieved by elevation of involved area. Associated factors include: nothing. Cardiac risk factors: hypertension and obesity (BMI >= 30 kg/m2).  The following portions of the patient's history were reviewed and updated as appropriate:  She  has a past medical history of Allergic rhinitis, Anxiety, Back pain, Cataract, Complication of anesthesia, Depression, Hypertension, Hypothyroidism, Osteoarthritis (2012), Osteoarthritis of knee, and Osteoporosis. She does not have any pertinent problems on file. Current Outpatient Medications on File Prior to Visit  Medication Sig Dispense Refill  . ALPRAZolam (XANAX) 0.25 MG tablet Take 0.5 tablets (0.125 mg total) by mouth daily as needed for anxiety. 20 tablet 1  . Ascorbic Acid (VITAMIN C) 1000 MG tablet Take 1,000 mg by mouth daily.    . baclofen (LIORESAL) 10 MG tablet Take 1 tablet by mouth twice daily as needed 30 tablet 0  . cetirizine (ZYRTEC) 10 MG tablet Take 1 tablet (10 mg total) by mouth daily. 90 tablet 0  . Cholecalciferol (VITAMIN D) 125 MCG (5000 UT) CAPS Take 125 mcg by mouth daily. 30 capsule 11  . ibuprofen (ADVIL) 600 MG tablet Take 1 tablet (600 mg total) by mouth every 8 (eight) hours as needed. Do not take on days you use meloxicam 30 tablet 3  . meloxicam (MOBIC) 15 MG tablet Take 1 tablet (15 mg total) by mouth daily as needed for pain. Do not take on days you use ibuprofen 30 tablet 3  . SYNTHROID 200 MCG tablet TAKE 1 TABLET BY MOUTH ONCE DAILY BEFORE BREAKFAST 90 tablet 1  .  triamterene-hydrochlorothiazide (MAXZIDE-25) 37.5-25 MG tablet Take 1 tablet by mouth once daily for blood pressure 90 tablet 0  . UNABLE TO FIND [facility] [place] BLOOD SUGAR FOCUS [place] LIVER FOCUS BLUE SKY D3 WITH K2    . Zinc Sulfate (ZINC 15 PO) Take by mouth.     No current facility-administered medications on file prior to visit.   She is allergic to penicillins and felodipine..  Review of Systems Cardiovascular: positive for lower extremity edema, negative for chest pressure/discomfort, dyspnea and fatigue Musculoskeletal:negative except for arthralgias and stiff joints   Objective:    BP 126/65   Pulse 82   Temp 97.6 F (36.4 C) (Temporal)   Resp 17   Ht 5\' 7"  (1.702 m)   Wt 253 lb 9.6 oz (115 kg)   LMP 05/31/2018 (Exact Date)   SpO2 99%   BMI 39.72 kg/m  General appearance: alert and cooperative Lungs: clear to auscultation bilaterally Heart: regular rate and rhythm, S1, S2 normal, no murmur, click, rub or gallop Extremities: Edema 3+ pitting on the left and 2+ pitting on the right at the foot and ankle Pulses: 2+ and symmetric Skin: Skin color, texture, turgor normal. No rashes or lesions     Assessment:     Edema secondary to increased Na intake.   We discussed the symptoms of edema which she has never had as well as treatment.  I have written her prescription for compression stockings and recommend that she wear them during the day.  I have written a very small prescription for furosemide for the next 3 to 4 days.  Asked her to hold her Maxide during this time as this will probably help drop her pressure.  I let her know that potassium loss could be an issue as the blood pressure medication she is on preserves her potassium, furosemide does not.  Advised to supplement her diet with potassium over the weekend, 1-2 bananas a day. Should she have any problems or concerns she will call or return and we can always check a BMP next week again if necessary.  Ordered labs today that  she was due for.  Verbalized understanding and will follow up with Dr. Pamella Pert.  Plan:    Recommendations: decrease sodium in the diet, elevate feet above the level of the heart whenever possible and use of compression stockings. The patient was also instructed to call IMMEDIATELY (i.e., day or night) if any cardiopulmonary symptoms occur, especially chest pain, shortness of breath, dyspnea on exertion, paroxysmal nocturnal dyspnea, or orthopnea, and these were explained.

## 2019-08-09 LAB — CMP14+EGFR
ALT: 23 IU/L (ref 0–32)
AST: 28 IU/L (ref 0–40)
Albumin/Globulin Ratio: 1.7 (ref 1.2–2.2)
Albumin: 3.5 g/dL — ABNORMAL LOW (ref 3.8–4.9)
Alkaline Phosphatase: 59 IU/L (ref 39–117)
BUN/Creatinine Ratio: 16 (ref 9–23)
BUN: 11 mg/dL (ref 6–24)
Bilirubin Total: 0.3 mg/dL (ref 0.0–1.2)
CO2: 26 mmol/L (ref 20–29)
Calcium: 9.3 mg/dL (ref 8.7–10.2)
Chloride: 101 mmol/L (ref 96–106)
Creatinine, Ser: 0.68 mg/dL (ref 0.57–1.00)
GFR calc Af Amer: 112 mL/min/{1.73_m2} (ref >59)
GFR calc non Af Amer: 97 mL/min/{1.73_m2} (ref >59)
Globulin, Total: 2.1 g/dL (ref 1.5–4.5)
Glucose: 86 mg/dL (ref 65–99)
Potassium: 4.3 mmol/L (ref 3.5–5.2)
Sodium: 142 mmol/L (ref 134–144)
Total Protein: 5.6 g/dL — ABNORMAL LOW (ref 6.0–8.5)

## 2019-08-09 LAB — VITAMIN D 25 HYDROXY (VIT D DEFICIENCY, FRACTURES): Vit D, 25-Hydroxy: 28.3 ng/mL — ABNORMAL LOW (ref 30.0–100.0)

## 2019-08-09 LAB — LIPID PANEL
Chol/HDL Ratio: 4.5 ratio — ABNORMAL HIGH (ref 0.0–4.4)
Cholesterol, Total: 138 mg/dL (ref 100–199)
HDL: 31 mg/dL — ABNORMAL LOW (ref >39)
LDL Chol Calc (NIH): 88 mg/dL (ref 0–99)
Triglycerides: 103 mg/dL (ref 0–149)
VLDL Cholesterol Cal: 19 mg/dL (ref 5–40)

## 2019-08-09 LAB — TSH: TSH: 3.68 u[IU]/mL (ref 0.450–4.500)

## 2019-08-09 LAB — HEMOGLOBIN A1C
Est. average glucose Bld gHb Est-mCnc: 117 mg/dL
Hgb A1c MFr Bld: 5.7 % — ABNORMAL HIGH (ref 4.8–5.6)

## 2019-08-12 ENCOUNTER — Telehealth: Payer: Self-pay | Admitting: Family Medicine

## 2019-08-12 NOTE — Telephone Encounter (Signed)
Labs reviewed via [employer]

## 2019-08-12 NOTE — Telephone Encounter (Signed)
Spoke with pt regarding her lab concerns. I did explain to her with some of her lab what reference range she should be in. She seen [facility] on 4/9 but would like to stick with her provider with her lab concerns and advise her on what to do next. I told her that I would F/U with Lexine Baton to give her a call with her concerns.  Please Advise.

## 2019-08-12 NOTE — Telephone Encounter (Signed)
Please call patient. She has concerns with her test results.

## 2019-08-15 DIAGNOSIS — Z6839 Body mass index (BMI) 39.0-39.9, adult: Secondary | ICD-10-CM | POA: Diagnosis not present

## 2019-08-15 DIAGNOSIS — R7303 Prediabetes: Secondary | ICD-10-CM | POA: Diagnosis not present

## 2019-09-09 ENCOUNTER — Other Ambulatory Visit: Payer: Self-pay

## 2019-09-09 ENCOUNTER — Telehealth: Payer: Self-pay | Admitting: Family Medicine

## 2019-09-09 DIAGNOSIS — I10 Essential (primary) hypertension: Secondary | ICD-10-CM

## 2019-09-09 MED ORDER — TRIAMTERENE-HCTZ 37.5-25 MG PO TABS: ORAL_TABLET | ORAL | 0 refills | Status: DC

## 2019-09-09 NOTE — Telephone Encounter (Signed)
Medication Refill - Medication:  triamterene-hydrochlorothiazide (MAXZIDE-25) 37.5-25 MG tablet  Has the patient contacted their pharmacy? Yes.   (Agent: If no, request that the patient contact the pharmacy for the refill.) (Agent: If yes, when and what did the pharmacy advise?)  Preferred Pharmacy (with phone number or street name): [facility] (fax request is being sent over)  Agent: Please be advised that RX refills may take up to 3 business days. We ask that you follow-up with your pharmacy.

## 2019-11-10 ENCOUNTER — Ambulatory Visit: Payer: Medicare HMO | Admitting: Orthopedic Surgery

## 2019-11-17 DIAGNOSIS — R7303 Prediabetes: Secondary | ICD-10-CM | POA: Diagnosis not present

## 2019-11-17 DIAGNOSIS — Z6839 Body mass index (BMI) 39.0-39.9, adult: Secondary | ICD-10-CM | POA: Diagnosis not present

## 2019-11-19 ENCOUNTER — Ambulatory Visit: Payer: Medicare HMO | Admitting: Orthopedic Surgery

## 2019-11-19 DIAGNOSIS — Z20822 Contact with and (suspected) exposure to covid-19: Secondary | ICD-10-CM | POA: Diagnosis not present

## 2019-12-01 DIAGNOSIS — R7303 Prediabetes: Secondary | ICD-10-CM | POA: Diagnosis not present

## 2019-12-01 DIAGNOSIS — I10 Essential (primary) hypertension: Secondary | ICD-10-CM | POA: Diagnosis not present

## 2019-12-01 DIAGNOSIS — Z6839 Body mass index (BMI) 39.0-39.9, adult: Secondary | ICD-10-CM | POA: Diagnosis not present

## 2019-12-08 ENCOUNTER — Other Ambulatory Visit: Payer: Self-pay | Admitting: Family Medicine

## 2019-12-08 ENCOUNTER — Other Ambulatory Visit: Payer: Self-pay

## 2019-12-08 ENCOUNTER — Telehealth: Payer: Self-pay

## 2019-12-08 DIAGNOSIS — I10 Essential (primary) hypertension: Secondary | ICD-10-CM

## 2019-12-08 MED ORDER — TRIAMTERENE-HCTZ 37.5-25 MG PO TABS: ORAL_TABLET | ORAL | 0 refills | Status: DC

## 2019-12-08 NOTE — Telephone Encounter (Signed)
Pt. Called requesting triamterene-hydrochlorothiazide be refilled until the time of her appt. 01/16/2020.

## 2019-12-10 ENCOUNTER — Other Ambulatory Visit: Payer: Self-pay | Admitting: Family Medicine

## 2019-12-10 NOTE — Telephone Encounter (Signed)
Patient requesting Alprazolam 0.25 mg refill. Last refill 07/25/2019 at office visit 20 tablets with 1 RF. Next appointment 12/16/2019. Thank you

## 2019-12-10 NOTE — Telephone Encounter (Signed)
Requested medication (s) are due for refill today: yes  Requested medication (s) are on the active medication list: yes  Last refill:  07/25/19 #20 with 1 refill  Future visit scheduled: yes  Notes to clinic:  Please review for refill. Refill not delegated per protocol    Requested Prescriptions  Pending Prescriptions Disp Refills   ALPRAZolam (XANAX) 0.25 MG tablet [Pharmacy Med Name: ALPRAZOLAM 0.25 MG Tablet] 15 tablet     Sig: TAKE 1/2 TABLET EVERY DAY AS NEEDED FOR ANXIETY      Not Delegated - Psychiatry:  Anxiolytics/Hypnotics Failed - 12/10/2019  5:06 PM      Failed - This refill cannot be delegated      Failed - Urine Drug Screen completed in last 360 days.      Passed - Valid encounter within last 6 months    Recent Outpatient Visits           4 months ago Bilateral lower extremity edema   [facility] at [facility], Lorelee Market, NP   4 months ago Hypothyroidism (acquired)   [facility] at [facility], Lilia Argue, MD   10 months ago Trigger finger of right thumb   [facility] at [facility], Lilia Argue, MD   11 months ago Adjustment disorder with anxious mood   [facility] at [facility], Lilia Argue, MD   1 year ago Exposure to Covid-19 Virus   [facility] at [facility], Ranell Patrick, MD       Future Appointments             In 6 days Rutherford Guys, MD Primary Care at [place], [facility]   In 1 month Rutherford Guys, MD Primary Care at [place], [facility]

## 2019-12-11 NOTE — Telephone Encounter (Signed)
pmp reviewd, appropriate meds refilled 

## 2019-12-16 ENCOUNTER — Other Ambulatory Visit: Payer: Self-pay

## 2019-12-16 ENCOUNTER — Ambulatory Visit (INDEPENDENT_AMBULATORY_CARE_PROVIDER_SITE_OTHER): Payer: Medicare HMO | Admitting: Family Medicine

## 2019-12-16 ENCOUNTER — Encounter: Payer: Self-pay | Admitting: Family Medicine

## 2019-12-16 VITALS — BP 139/78 | HR 76 | Temp 97.6°F | Resp 18 | Ht 67.0 in | Wt 249.0 lb

## 2019-12-16 DIAGNOSIS — R600 Localized edema: Secondary | ICD-10-CM | POA: Diagnosis not present

## 2019-12-16 DIAGNOSIS — E559 Vitamin D deficiency, unspecified: Secondary | ICD-10-CM | POA: Diagnosis not present

## 2019-12-16 DIAGNOSIS — E039 Hypothyroidism, unspecified: Secondary | ICD-10-CM | POA: Diagnosis not present

## 2019-12-16 DIAGNOSIS — I10 Essential (primary) hypertension: Secondary | ICD-10-CM

## 2019-12-16 DIAGNOSIS — M79642 Pain in left hand: Secondary | ICD-10-CM | POA: Diagnosis not present

## 2019-12-16 MED ORDER — TRIAMTERENE-HCTZ 37.5-25 MG PO TABS: ORAL_TABLET | ORAL | 1 refills | Status: DC

## 2019-12-16 MED ORDER — FUROSEMIDE 20 MG PO TABS: 20.0000 mg | ORAL_TABLET | Freq: Every day | ORAL | 2 refills | Status: DC | PRN

## 2019-12-16 NOTE — Patient Instructions (Signed)
° ° ° °  If you have lab work done today you will be contacted with your lab results within the next 2 weeks.  If you have not heard from us then please contact us. The fastest way to get your results is to register for My Chart. ° ° °IF you received an x-ray today, you will receive an invoice from [facility]. Please contact [facility] at [phone] with questions or concerns regarding your invoice.  ° °IF you received labwork today, you will receive an invoice from [facility]. Please contact [facility] at [phone] with questions or concerns regarding your invoice.  ° °Our billing staff will not be able to assist you with questions regarding bills from these companies. ° °You will be contacted with the lab results as soon as they are available. The fastest way to get your results is to activate your My Chart account. Instructions are located on the last page of this paperwork. If you have not heard from us regarding the results in 2 weeks, please contact this office. °  ° ° ° °

## 2019-12-16 NOTE — Progress Notes (Signed)
8/17/20215:12 PM  Kathleen Mcfarland [birth year], 59 y.o., female [id number]  Chief Complaint  Patient presents with  . Referral    HPI:   Patient is a 59 y.o. female with past medical history significant for hypothyroidism, anxiety, HTN, vitamin D deficiency, prediabtes, OA of right knee and handswho presents today for routine fu  Last OV march 2021 - increase d3 by 1000 units daily  She wants referral to new PCP She is taking vitamin D 4000 units a day She needs a new referral to go back to hand surgeon for LEFT hand pain. Saw dr Jeannie Fend, [facility] nov 2020 Has been checking BP at home - had high readings 150/90s with edema, she increased maxzide twice a day for several weeks, now back to once a day for past month Has been given small rx for lasix for edema in the past She started working on losing weight She takes zinc, collagen, vitamin c and b12  BP Readings from Last 3 Encounters:  12/16/19 139/78  08/08/19 126/65  07/25/19 123/65   Wt Readings from Last 3 Encounters:  12/16/19 249 lb (112.9 kg)  08/08/19 253 lb 9.6 oz (115 kg)  07/25/19 247 lb 12.8 oz (112.4 kg)    Depression screen [facility] 2/9 12/16/2019 12/16/2019 08/08/2019  Decreased Interest 0 0 0  Down, Depressed, Hopeless 0 0 0  PHQ - 2 Score 0 0 0  Altered sleeping - - -  Tired, decreased energy - - -  Change in appetite - - -  Feeling bad or failure about yourself  - - -  Trouble concentrating - - -  Moving slowly or fidgety/restless - - -  Suicidal thoughts - - -  PHQ-9 Score - - -  Difficult doing work/chores - - -    Fall Risk  12/16/2019 12/16/2019 08/08/2019 07/25/2019 01/24/2019  Falls in the past year? 0 0 0 0 0  Number falls in past yr: 0 0 0 0 0  Injury with Fall? 0 0 0 0 0  Risk for fall due to : - - - - -  Follow up Falls evaluation completed Falls evaluation completed Falls evaluation completed - -     Allergies  Allergen Reactions  . Penicillins Hives  . Felodipine Nausea And  Vomiting    Prior to Admission medications   Medication Sig Start Date End Date Taking? Authorizing Provider  cetirizine (ZYRTEC) 10 MG tablet Take 1 tablet (10 mg total) by mouth daily. 07/09/19  Yes Rutherford Guys, MD  SYNTHROID 200 MCG tablet TAKE 1 TABLET BY MOUTH ONCE DAILY BEFORE BREAKFAST 07/17/19  Yes Rutherford Guys, MD  triamterene-hydrochlorothiazide [facility]) 37.5-25 MG tablet Take 1 tablet by mouth once daily for blood pressure 12/08/19  Yes Rutherford Guys, MD  UNABLE TO [place] LIVER FOCUS BLUE SKY D3 WITH K2   Yes [provider]  ALPRAZolam (XANAX) 0.25 MG tablet TAKE 1/2 TABLET EVERY DAY AS NEEDED FOR ANXIETY Patient not taking: Reported on 12/16/2019 12/11/19   Rutherford Guys, MD  Ascorbic Acid (VITAMIN C) 1000 MG tablet Take 1,000 mg by mouth daily. Patient not taking: Reported on 12/16/2019    [provider]  baclofen (LIORESAL) 10 MG tablet Take 1 tablet by mouth twice daily as needed Patient not taking: Reported on 12/16/2019 06/09/19   Magnant, Gerrianne Scale, PA-C  Cholecalciferol (VITAMIN D) 125 MCG (5000 UT) CAPS Take 125 mcg by mouth daily. Patient not taking:  Reported on 12/16/2019 09/16/18   Rutherford Guys, MD  furosemide (LASIX) 20 MG tablet Take 1 tablet (20 mg total) by mouth daily. Patient not taking: Reported on 12/16/2019 08/08/19   Wendall Mola, NP  ibuprofen (ADVIL) 600 MG tablet Take 1 tablet (600 mg total) by mouth every 8 (eight) hours as needed. Do not take on days you use meloxicam Patient not taking: Reported on 12/16/2019 07/25/19   Rutherford Guys, MD  meloxicam (MOBIC) 15 MG tablet Take 1 tablet (15 mg total) by mouth daily as needed for pain. Do not take on days you use ibuprofen Patient not taking: Reported on 12/16/2019 07/25/19   Rutherford Guys, MD  Zinc Sulfate (ZINC 15 PO) Take by mouth. Patient not taking: Reported on 12/16/2019    [provider]    Past Medical History:  Diagnosis  Date  . Allergic rhinitis   . Anxiety   . Back pain   . Cataract   . Complication of anesthesia    hard to wake up  . Depression   . Hypertension   . Hypothyroidism   . Osteoarthritis 2012   bilateral knees  . Osteoarthritis of knee   . Osteoporosis     Past Surgical History:  Procedure Laterality Date  . colonscopy     . DILATION AND CURETTAGE OF UTERUS    . ROTATOR CUFF REPAIR    . SHOULDER ARTHROSCOPY  04/13/2011   Procedure: ARTHROSCOPY SHOULDER;  Surgeon: Ninetta Lights, MD;  Location: [place];  Service: Orthopedics;  Laterality: Right;  Right Shoulder Arthroscopy with Acrmioploasty, Arhtroscopic rotator cuff repair and Microfracture humeral head  . SHOULDER ARTHROSCOPY WITH ROTATOR CUFF REPAIR Right 07/04/2012   Procedure: SHOULDER ARTHROSCOPY WITH ROTATOR CUFF REPAIR;  Surgeon: Ninetta Lights, MD;  Location: [place];  Service: Orthopedics;  Laterality: Right;  RIGHT SHOULDER ARTHROSCOPY WITH ARTHROSCOPIC ROTATOR CUFF REPAIR, LYSIS OF ADHESIONS  . TUBAL LIGATION      Social History   Tobacco Use  . Smoking status: Never Smoker  . Smokeless tobacco: Never Used  Substance Use Topics  . Alcohol use: Yes    Comment: glass of wine once a month    Family History  Problem Relation Age of Onset  . Coronary artery disease Father 10       heart disease diagnoised  . Hypertension Father   . Heart disease Father   . Colon cancer Father   . Diabetes Father   . Hyperlipidemia Father   . Diabetes Mother   . Hypertension Mother   . Colon cancer Other        great uncle at 75, great aunt dx at 81, GM age 19  . Colon polyps Other        2 sisters dx at age 44 and 54  . Arthritis Sister   . Hypertension Sister   . Diabetes Sister   . Colon polyps Sister   . Colon cancer Maternal Grandmother   . Ovarian cancer Maternal Grandmother   . Cirrhosis Maternal Grandfather   . Heart disease Paternal Grandmother   . Cervical cancer Sister   .  Other Sister        uterine fibroids  . Hypertension Sister   . Diabetes Sister   . Colon polyps Sister   . Colon polyps Brother   . Breast cancer Neg Hx   . Stroke Neg Hx     Review of Systems  Constitutional: Negative  for chills and fever.  Respiratory: Negative for cough and shortness of breath.   Cardiovascular: Positive for leg swelling. Negative for chest pain and palpitations.  Gastrointestinal: Negative for abdominal pain, nausea and vomiting.   per hpi   OBJECTIVE:  Today's Vitals   12/16/19 1646  BP: 139/78  Pulse: 76  Resp: 18  Temp: 97.6 F (36.4 C)  TempSrc: Temporal  SpO2: 97%  Weight: 249 lb (112.9 kg)  Height: 5\' 7"  (1.702 m)   Body mass index is 39 kg/m.   Physical Exam Vitals and nursing note reviewed.  Constitutional:      Appearance: She is well-developed.  HENT:     Head: Normocephalic and atraumatic.     Mouth/Throat:     Pharynx: No oropharyngeal exudate.  Eyes:     General: No scleral icterus.    Extraocular Movements: Extraocular movements intact.     Conjunctiva/sclera: Conjunctivae normal.     Pupils: Pupils are equal, round, and reactive to light.  Cardiovascular:     Rate and Rhythm: Normal rate and regular rhythm.     Heart sounds: Normal heart sounds. No murmur heard.  No friction rub. No gallop.   Pulmonary:     Effort: Pulmonary effort is normal.     Breath sounds: Normal breath sounds. No wheezing, rhonchi or rales.  Musculoskeletal:     Cervical back: Neck supple.     Right lower leg: Edema (trace pitting BLE) present.     Left lower leg: Edema present.  Skin:    General: Skin is warm and dry.  Neurological:     Mental Status: She is alert and oriented to person, place, and time.     No results found for this or any previous visit (from the past 24 hour(s)).  No results found.   ASSESSMENT and PLAN  1. HYPERTENSION, BENIGN ESSENTIAL Controlled. Continue current regime.  - triamterene-hydrochlorothiazide  (MAXZIDE-25) 37.5-25 MG tablet; Take 1 tablet by mouth once daily for blood pressure - CBC; Future - Comprehensive metabolic panel; Future  2. Hypothyroidism (acquired) Checking labs today, medications will be adjusted as needed.  - TSH; Future - T4, Free; Future  3. Bilateral lower extremity edema Discussed supportive measures, compression stockings, low salt, elevation, lasix prn for sign edema/high bp  4. Vitamin D deficiency Checking labs today, medications will be adjusted as needed.  - VITAMIN D 25 Hydroxy (Vit-D Deficiency, Fractures); Future   5. Left hand pain - Ambulatory referral to Orthopedic Surgery  Other orders - furosemide (LASIX) 20 MG tablet; Take 1 tablet (20 mg total) by mouth daily as needed for edema.  Return in about 3 months (around 03/17/2020) for needs lab only visit for later this week.    Rutherford Guys, MD Primary Care at [place], [place] [address] Ph.  [phone] Fax [phone]

## 2019-12-22 ENCOUNTER — Ambulatory Visit (INDEPENDENT_AMBULATORY_CARE_PROVIDER_SITE_OTHER): Payer: Medicare HMO | Admitting: Family Medicine

## 2019-12-22 ENCOUNTER — Other Ambulatory Visit: Payer: Self-pay

## 2019-12-22 DIAGNOSIS — E559 Vitamin D deficiency, unspecified: Secondary | ICD-10-CM | POA: Diagnosis not present

## 2019-12-22 DIAGNOSIS — E039 Hypothyroidism, unspecified: Secondary | ICD-10-CM | POA: Diagnosis not present

## 2019-12-22 DIAGNOSIS — I10 Essential (primary) hypertension: Secondary | ICD-10-CM

## 2019-12-23 LAB — CBC
Hematocrit: 41.8 % (ref 34.0–46.6)
Hemoglobin: 13.8 g/dL (ref 11.1–15.9)
MCH: 28.4 pg (ref 26.6–33.0)
MCHC: 33 g/dL (ref 31.5–35.7)
MCV: 86 fL (ref 79–97)
Platelets: 256 10*3/uL (ref 150–450)
RBC: 4.86 x10E6/uL (ref 3.77–5.28)
RDW: 12.4 % (ref 11.7–15.4)
WBC: 3.8 10*3/uL (ref 3.4–10.8)

## 2019-12-23 LAB — COMPREHENSIVE METABOLIC PANEL
ALT: 32 IU/L (ref 0–32)
AST: 32 IU/L (ref 0–40)
Albumin/Globulin Ratio: 1.7 (ref 1.2–2.2)
Albumin: 3.7 g/dL — ABNORMAL LOW (ref 3.8–4.9)
Alkaline Phosphatase: 63 IU/L (ref 48–121)
BUN/Creatinine Ratio: 16 (ref 9–23)
BUN: 11 mg/dL (ref 6–24)
Bilirubin Total: 0.4 mg/dL (ref 0.0–1.2)
CO2: 24 mmol/L (ref 20–29)
Calcium: 9.5 mg/dL (ref 8.7–10.2)
Chloride: 104 mmol/L (ref 96–106)
Creatinine, Ser: 0.7 mg/dL (ref 0.57–1.00)
GFR calc Af Amer: 111 mL/min/{1.73_m2} (ref >59)
GFR calc non Af Amer: 96 mL/min/{1.73_m2} (ref >59)
Globulin, Total: 2.2 g/dL (ref 1.5–4.5)
Glucose: 115 mg/dL — ABNORMAL HIGH (ref 65–99)
Potassium: 4 mmol/L (ref 3.5–5.2)
Sodium: 142 mmol/L (ref 134–144)
Total Protein: 5.9 g/dL — ABNORMAL LOW (ref 6.0–8.5)

## 2019-12-23 LAB — T4, FREE: Free T4: 1.47 ng/dL (ref 0.82–1.77)

## 2019-12-23 LAB — TSH: TSH: 2.88 u[IU]/mL (ref 0.450–4.500)

## 2019-12-23 LAB — VITAMIN D 25 HYDROXY (VIT D DEFICIENCY, FRACTURES): Vit D, 25-Hydroxy: 35.4 ng/mL (ref 30.0–100.0)

## 2019-12-25 ENCOUNTER — Telehealth: Payer: Self-pay | Admitting: Family Medicine

## 2019-12-25 NOTE — Telephone Encounter (Signed)
Patient would like a call back regarding her recent lab results

## 2019-12-25 NOTE — Telephone Encounter (Signed)
Will call when provider results tests

## 2019-12-29 DIAGNOSIS — Z6839 Body mass index (BMI) 39.0-39.9, adult: Secondary | ICD-10-CM | POA: Diagnosis not present

## 2019-12-29 DIAGNOSIS — I10 Essential (primary) hypertension: Secondary | ICD-10-CM | POA: Diagnosis not present

## 2020-01-09 ENCOUNTER — Telehealth: Payer: Self-pay

## 2020-01-09 NOTE — Telephone Encounter (Signed)
Pt. Called requesting Dr .Ardyth Gal reference/recommendation on another Dr. Edythe Clarity see

## 2020-01-09 NOTE — Telephone Encounter (Signed)
Message requesting recommendations to another pcp.

## 2020-01-12 DIAGNOSIS — R7303 Prediabetes: Secondary | ICD-10-CM | POA: Diagnosis not present

## 2020-01-12 DIAGNOSIS — I10 Essential (primary) hypertension: Secondary | ICD-10-CM | POA: Diagnosis not present

## 2020-01-12 DIAGNOSIS — Z6839 Body mass index (BMI) 39.0-39.9, adult: Secondary | ICD-10-CM | POA: Diagnosis not present

## 2020-01-13 NOTE — Telephone Encounter (Signed)
Will discuss at her next appt with me

## 2020-01-15 DIAGNOSIS — M65311 Trigger thumb, right thumb: Secondary | ICD-10-CM | POA: Insufficient documentation

## 2020-01-23 ENCOUNTER — Ambulatory Visit: Payer: Medicare HMO | Admitting: Family Medicine

## 2020-01-26 ENCOUNTER — Encounter: Payer: Self-pay | Admitting: Family Medicine

## 2020-02-02 ENCOUNTER — Ambulatory Visit (INDEPENDENT_AMBULATORY_CARE_PROVIDER_SITE_OTHER): Payer: Medicare HMO | Admitting: Family Medicine

## 2020-02-02 ENCOUNTER — Encounter: Payer: Self-pay | Admitting: Family Medicine

## 2020-02-02 ENCOUNTER — Other Ambulatory Visit: Payer: Self-pay | Admitting: Family Medicine

## 2020-02-02 ENCOUNTER — Other Ambulatory Visit: Payer: Self-pay

## 2020-02-02 VITALS — BP 122/75 | HR 90 | Temp 98.0°F | Ht 67.0 in | Wt 251.0 lb

## 2020-02-02 DIAGNOSIS — E559 Vitamin D deficiency, unspecified: Secondary | ICD-10-CM | POA: Diagnosis not present

## 2020-02-02 DIAGNOSIS — E039 Hypothyroidism, unspecified: Secondary | ICD-10-CM | POA: Diagnosis not present

## 2020-02-02 DIAGNOSIS — I10 Essential (primary) hypertension: Secondary | ICD-10-CM | POA: Diagnosis not present

## 2020-02-02 DIAGNOSIS — Z23 Encounter for immunization: Secondary | ICD-10-CM | POA: Diagnosis not present

## 2020-02-02 DIAGNOSIS — R590 Localized enlarged lymph nodes: Secondary | ICD-10-CM

## 2020-02-02 MED ORDER — VITAMIN C 500 MG PO CAPS: ORAL_CAPSULE | ORAL | Status: AC

## 2020-02-02 MED ORDER — COLLAGEN ULTRA PO CAPS: ORAL_CAPSULE | ORAL | Status: DC

## 2020-02-02 MED ORDER — VITAMIN D 125 MCG (5000 UT) PO CAPS
125.0000 ug | ORAL_CAPSULE | Freq: Every day | ORAL | 11 refills | Status: DC
Start: 2020-02-02 — End: 2024-02-14

## 2020-02-02 MED ORDER — ZINC 100 MG PO TABS: ORAL_TABLET | ORAL | 0 refills | Status: AC

## 2020-02-02 MED ORDER — SYNTHROID 200 MCG PO TABS: ORAL_TABLET | ORAL | 1 refills | Status: DC

## 2020-02-02 NOTE — Patient Instructions (Signed)
° ° ° °  If you have lab work done today you will be contacted with your lab results within the next 2 weeks.  If you have not heard from us then please contact us. The fastest way to get your results is to register for My Chart. ° ° °IF you received an x-ray today, you will receive an invoice from [facility]. Please contact [facility] at [phone] with questions or concerns regarding your invoice.  ° °IF you received labwork today, you will receive an invoice from [facility]. Please contact [facility] at [phone] with questions or concerns regarding your invoice.  ° °Our billing staff will not be able to assist you with questions regarding bills from these companies. ° °You will be contacted with the lab results as soon as they are available. The fastest way to get your results is to activate your My Chart account. Instructions are located on the last page of this paperwork. If you have not heard from us regarding the results in 2 weeks, please contact this office. °  ° ° ° °

## 2020-02-02 NOTE — Progress Notes (Signed)
10/4/20211:47 PM  Kathleen Mcfarland 1960/12/26, 59 y.o., female [id number]  Chief Complaint  Patient presents with  . Hypothyroidism    HPI:   Patient is a 59 y.o. female with past medical history significant for hypothyroidism, anxiety, HTN, vitamin D deficiency, prediabtes, OA of right knee and hands who presents today for routine followup  She is overall doing well She takes her maxzide and synthroid daily wo issues Her labs from aug show thyroid and vitamin D at goal Left posterior neck swollen and TTP LN x 2 weeks No recent illness, no recent immunizations Has not had covid vaccine  Lab Results  Component Value Date   TSH 2.880 12/22/2019   Lab Results  Component Value Date   CREATININE 0.70 12/22/2019   BUN 11 12/22/2019   NA 142 12/22/2019   K 4.0 12/22/2019   CL 104 12/22/2019   CO2 24 12/22/2019    Wt Readings from Last 3 Encounters:  02/02/20 251 lb (113.9 kg)  12/16/19 249 lb (112.9 kg)  08/08/19 253 lb 9.6 oz (115 kg)     Depression screen [facility] 2/9 12/16/2019 12/16/2019 08/08/2019  Decreased Interest 0 0 0  Down, Depressed, Hopeless 0 0 0  PHQ - 2 Score 0 0 0  Altered sleeping - - -  Tired, decreased energy - - -  Change in appetite - - -  Feeling bad or failure about yourself  - - -  Trouble concentrating - - -  Moving slowly or fidgety/restless - - -  Suicidal thoughts - - -  PHQ-9 Score - - -  Difficult doing work/chores - - -    Fall Risk  12/16/2019 12/16/2019 08/08/2019 07/25/2019 01/24/2019  Falls in the past year? 0 0 0 0 0  Number falls in past yr: 0 0 0 0 0  Injury with Fall? 0 0 0 0 0  Risk for fall due to : - - - - -  Follow up Falls evaluation completed Falls evaluation completed Falls evaluation completed - -     Allergies  Allergen Reactions  . Penicillins Hives  . Felodipine Nausea And Vomiting    Prior to Admission medications   Medication Sig Start Date End Date Taking? Authorizing Provider  cetirizine (ZYRTEC) 10 MG  tablet Take 1 tablet (10 mg total) by mouth daily. 07/09/19   Rutherford Guys, MD  Cholecalciferol (VITAMIN D) 125 MCG (5000 UT) CAPS Take 125 mcg by mouth daily. Patient not taking: Reported on 12/16/2019 09/16/18   Rutherford Guys, MD  furosemide (LASIX) 20 MG tablet Take 1 tablet (20 mg total) by mouth daily as needed for edema. 12/16/19   Rutherford Guys, MD  SYNTHROID 200 MCG tablet TAKE 1 TABLET BY MOUTH ONCE DAILY BEFORE BREAKFAST 07/17/19   Rutherford Guys, MD  triamterene-hydrochlorothiazide [facility]) 37.5-25 MG tablet Take 1 tablet by mouth once daily for blood pressure 12/16/19   Rutherford Guys, MD  UNABLE TO [place] BLOOD SUGAR FOCUS [place] LIVER FOCUS BLUE SKY D3 WITH K2    [provider]    Past Medical History:  Diagnosis Date  . Allergic rhinitis   . Anxiety   . Back pain   . Cataract   . Complication of anesthesia    hard to wake up  . Depression   . Hypertension   . Hypothyroidism   . Osteoarthritis 2012   bilateral knees  . Osteoarthritis of knee   . Osteoporosis  Past Surgical History:  Procedure Laterality Date  . colonscopy     . DILATION AND CURETTAGE OF UTERUS    . ROTATOR CUFF REPAIR    . SHOULDER ARTHROSCOPY  04/13/2011   Procedure: ARTHROSCOPY SHOULDER;  Surgeon: Ninetta Lights, MD;  Location: [place];  Service: Orthopedics;  Laterality: Right;  Right Shoulder Arthroscopy with Acrmioploasty, Arhtroscopic rotator cuff repair and Microfracture humeral head  . SHOULDER ARTHROSCOPY WITH ROTATOR CUFF REPAIR Right 07/04/2012   Procedure: SHOULDER ARTHROSCOPY WITH ROTATOR CUFF REPAIR;  Surgeon: Ninetta Lights, MD;  Location: [place];  Service: Orthopedics;  Laterality: Right;  RIGHT SHOULDER ARTHROSCOPY WITH ARTHROSCOPIC ROTATOR CUFF REPAIR, LYSIS OF ADHESIONS  . TUBAL LIGATION      Social History   Tobacco Use  . Smoking status: Never Smoker  . Smokeless tobacco: Never Used  Substance Use Topics   . Alcohol use: Yes    Comment: glass of wine once a month    Family History  Problem Relation Age of Onset  . Coronary artery disease Father 45       heart disease diagnoised  . Hypertension Father   . Heart disease Father   . Colon cancer Father   . Diabetes Father   . Hyperlipidemia Father   . Diabetes Mother   . Hypertension Mother   . Colon cancer Other        great uncle at 30, great aunt dx at 36, GM age 5  . Colon polyps Other        2 sisters dx at age 42 and 22  . Arthritis Sister   . Hypertension Sister   . Diabetes Sister   . Colon polyps Sister   . Colon cancer Maternal Grandmother   . Ovarian cancer Maternal Grandmother   . Cirrhosis Maternal Grandfather   . Heart disease Paternal Grandmother   . Cervical cancer Sister   . Other Sister        uterine fibroids  . Hypertension Sister   . Diabetes Sister   . Colon polyps Sister   . Colon polyps Brother   . Breast cancer Neg Hx   . Stroke Neg Hx     Review of Systems  Constitutional: Negative for chills and fever.  Respiratory: Negative for cough and shortness of breath.   Cardiovascular: Negative for chest pain, palpitations and leg swelling.  Gastrointestinal: Negative for abdominal pain, nausea and vomiting.     OBJECTIVE:  Today's Vitals   02/02/20 1350  BP: 122/75  Pulse: 90  Temp: 98 F (36.7 C)  SpO2: 98%  Weight: 251 lb (113.9 kg)  Height: 5\' 7"  (1.702 m)   Body mass index is 39.31 kg/m.   Physical Exam Vitals and nursing note reviewed.  Constitutional:      Appearance: She is well-developed.  HENT:     Head: Normocephalic and atraumatic.     Right Ear: Tympanic membrane normal.     Left Ear: Tympanic membrane normal.     Mouth/Throat:     Pharynx: No oropharyngeal exudate or posterior oropharyngeal erythema.  Eyes:     General: No scleral icterus.    Extraocular Movements: Extraocular movements intact.     Conjunctiva/sclera: Conjunctivae normal.     Pupils: Pupils are  equal, round, and reactive to light.  Neck:     Thyroid: No thyroid mass, thyromegaly or thyroid tenderness.  Cardiovascular:     Rate and Rhythm: Normal rate and regular rhythm.  Heart sounds: Normal heart sounds. No murmur heard.  No friction rub. No gallop.   Pulmonary:     Effort: Pulmonary effort is normal.     Breath sounds: Normal breath sounds. No wheezing, rhonchi or rales.  Musculoskeletal:     Cervical back: Neck supple.  Lymphadenopathy:     Cervical: Cervical adenopathy present.     Right cervical: No superficial, deep or posterior cervical adenopathy.    Left cervical: Posterior cervical adenopathy (~ 1.5 cm mobile and tender LN) present. No superficial or deep cervical adenopathy.  Skin:    General: Skin is warm and dry.  Neurological:     Mental Status: She is alert and oriented to person, place, and time.     No results found for this or any previous visit (from the past 24 hour(s)).  No results found.   ASSESSMENT and PLAN  1. Hypothyroidism (acquired) Controlled. Continue current regime.  - SYNTHROID 200 MCG tablet; TAKE 1 TABLET BY MOUTH ONCE DAILY BEFORE BREAKFAST  2. Lymphadenopathy of left cervical region Exam favors reactive, [place] pending - US Soft Tissue Head/Neck (NON-THYROID); Future  3. Vitamin D deficiency Controlled. Continue current regime.   4. HYPERTENSION, BENIGN ESSENTIAL Controlled. Continue current regime.   5. Need for influenza vaccination - Flu Vaccine QUAD 36+ mos IM  Other orders - Zinc 100 MG TABS; Take 1 tab daily - Ascorbic Acid (VITAMIN C) 500 MG CAPS; Take 1 tab daily - Specialty Vitamins Products (COLLAGEN ULTRA) CAPS; Take 1 tab daily - Cholecalciferol (VITAMIN D) 125 MCG (5000 UT) CAPS; Take 125 mcg by mouth daily.  Return for after ultrasound wih Dr Mitchel Honour.    Rutherford Guys, MD Primary Care at [address] [place], [place] [address] Ph.  [phone] Fax [phone]

## 2020-02-03 NOTE — Telephone Encounter (Signed)
Requested medications are due for refill today?  Unknown   Requested medications are on active medication list?  No  Last Refill:   Unknown  Future visit scheduled?  No   Notes to Clinic:   This medication is not on patient's medication list.  Patient was seen by provider on 02/02/2020 and there was no mention of Meloxicam in the note.

## 2020-02-04 DIAGNOSIS — Z20822 Contact with and (suspected) exposure to covid-19: Secondary | ICD-10-CM | POA: Diagnosis not present

## 2020-02-09 DIAGNOSIS — E559 Vitamin D deficiency, unspecified: Secondary | ICD-10-CM | POA: Diagnosis not present

## 2020-02-09 DIAGNOSIS — Z6838 Body mass index (BMI) 38.0-38.9, adult: Secondary | ICD-10-CM | POA: Diagnosis not present

## 2020-02-11 ENCOUNTER — Ambulatory Visit
Admission: RE | Admit: 2020-02-11 | Discharge: 2020-02-11 | Disposition: A | Discharge status: Home / Self Care | Payer: Medicare HMO | Source: Ambulatory Visit | Attending: Family Medicine | Admitting: Family Medicine

## 2020-02-11 DIAGNOSIS — R590 Localized enlarged lymph nodes: Secondary | ICD-10-CM

## 2020-02-17 ENCOUNTER — Telehealth: Payer: Self-pay | Admitting: Registered Nurse

## 2020-02-17 NOTE — Telephone Encounter (Signed)
Pt is very upset she got a $25 no show fee [facility] 01/23/20. She is disputing this charge saying we never let her know. She is very angry. She would like this fee dropped.

## 2020-02-17 NOTE — Telephone Encounter (Signed)
Phone records show that she was reminded of appt 2 days prior and pt answered call. Pt also has active MyChart with recent logins.  Fee not waived

## 2020-02-18 DIAGNOSIS — M6530 Trigger finger, unspecified finger: Secondary | ICD-10-CM | POA: Insufficient documentation

## 2020-02-18 DIAGNOSIS — M65311 Trigger thumb, right thumb: Secondary | ICD-10-CM | POA: Diagnosis not present

## 2020-02-18 DIAGNOSIS — M65332 Trigger finger, left middle finger: Secondary | ICD-10-CM | POA: Diagnosis not present

## 2020-02-18 DIAGNOSIS — M65342 Trigger finger, left ring finger: Secondary | ICD-10-CM | POA: Diagnosis not present

## 2020-02-18 DIAGNOSIS — M13842 Other specified arthritis, left hand: Secondary | ICD-10-CM | POA: Diagnosis not present

## 2020-02-19 ENCOUNTER — Telehealth: Payer: Self-pay | Admitting: *Deleted

## 2020-02-19 ENCOUNTER — Other Ambulatory Visit: Payer: Self-pay

## 2020-02-19 ENCOUNTER — Ambulatory Visit (INDEPENDENT_AMBULATORY_CARE_PROVIDER_SITE_OTHER): Payer: Medicare HMO | Admitting: Emergency Medicine

## 2020-02-19 ENCOUNTER — Encounter: Payer: Self-pay | Admitting: Emergency Medicine

## 2020-02-19 VITALS — BP 119/68 | HR 86 | Temp 98.7°F | Resp 16 | Ht 67.0 in | Wt 250.0 lb

## 2020-02-19 DIAGNOSIS — R591 Generalized enlarged lymph nodes: Secondary | ICD-10-CM | POA: Diagnosis not present

## 2020-02-19 DIAGNOSIS — E039 Hypothyroidism, unspecified: Secondary | ICD-10-CM | POA: Diagnosis not present

## 2020-02-19 DIAGNOSIS — R590 Localized enlarged lymph nodes: Secondary | ICD-10-CM | POA: Diagnosis not present

## 2020-02-19 DIAGNOSIS — I10 Essential (primary) hypertension: Secondary | ICD-10-CM

## 2020-02-19 NOTE — Progress Notes (Signed)
Kathleen Mcfarland 59 y.o.   Chief Complaint  Patient presents with  . Lymphadenopathy    review Ultrasound    HISTORY OF PRESENT ILLNESS: This is a 59 y.o. female complaining of lymph node enlargement to the left side of her neck.  Recently seen by Dr. Pamella Pert for the same scheduled ultrasound which showed benign lymph nodes and no sign of malignancy.  Patient however states that "it keeps growing". First visit with me.  May want to establish care with me.  HPI   Prior to Admission medications   Medication Sig Start Date End Date Taking? Authorizing Provider  Ascorbic Acid (VITAMIN C) 500 MG CAPS Take 1 tab daily 02/02/20   Rutherford Guys, MD  cetirizine (ZYRTEC) 10 MG tablet Take 1 tablet (10 mg total) by mouth daily. 07/09/19   Rutherford Guys, MD  Cholecalciferol (VITAMIN D) 125 MCG (5000 UT) CAPS Take 125 mcg by mouth daily. 02/02/20   Rutherford Guys, MD  furosemide (LASIX) 20 MG tablet Take 1 tablet (20 mg total) by mouth daily as needed for edema. 12/16/19   Rutherford Guys, MD  meloxicam (MOBIC) 15 MG tablet TAKE 1 TABLET (15 MG TOTAL) BY MOUTH DAILY AS NEEDED FOR PAIN. DO NOT TAKE ON DAYS YOU USE IBUPROFEN 02/03/20   Maximiano Coss, NP  Specialty Vitamins Products (COLLAGEN ULTRA) CAPS Take 1 tab daily 02/02/20   Rutherford Guys, MD  SYNTHROID 200 MCG tablet TAKE 1 TABLET BY MOUTH ONCE DAILY BEFORE BREAKFAST 02/02/20   Rutherford Guys, MD  triamterene-hydrochlorothiazide [facility]) 37.5-25 MG tablet Take 1 tablet by mouth once daily for blood pressure 12/16/19   Rutherford Guys, MD  UNABLE TO Marty BLOOD SUGAR FOCUS JJ LIVER FOCUS BLUE SKY D3 WITH K2    [provider]  Zinc 100 MG TABS Take 1 tab daily 02/02/20   Rutherford Guys, MD    Allergies  Allergen Reactions  . Penicillins Hives  . Felodipine Nausea And Vomiting    Patient Active Problem List   Diagnosis Date Noted  . Vitamin D deficiency 08/08/2019  . Bilateral lower extremity edema  08/08/2019  . Pre-diabetes 06/04/2017  . Menopausal syndrome (hot flashes) 06/04/2017  . History of colonic polyps 06/04/2017  . Chronic use of opiate for therapeutic purpose 07/03/2016  . Disorder of rotator cuff of both shoulders 05/19/2015  . Obesity (BMI 30-39.9) 05/19/2015  . Severe recurrent major depressive disorder with psychotic features ([place]) 01/03/2014  . Depression 01/02/2014  . Urinary incontinence 07/21/2010  . HYPERTENSION, BENIGN ESSENTIAL 07/31/2006  . Hypothyroidism (acquired) 06/23/2006  . Seasonal allergies 06/23/2006  . Osteoarthrosis involving lower leg 06/23/2006    Past Medical History:  Diagnosis Date  . Allergic rhinitis   . Anxiety   . Back pain   . Cataract   . Complication of anesthesia    hard to wake up  . Depression   . Hypertension   . Hypothyroidism   . Osteoarthritis 2012   bilateral knees  . Osteoarthritis of knee   . Osteoporosis     Past Surgical History:  Procedure Laterality Date  . colonscopy     . DILATION AND CURETTAGE OF UTERUS    . ROTATOR CUFF REPAIR    . SHOULDER ARTHROSCOPY  04/13/2011   Procedure: ARTHROSCOPY SHOULDER;  Surgeon: Ninetta Lights, MD;  Location: [facility];  Service: Orthopedics;  Laterality: Right;  Right Shoulder Arthroscopy with Acrmioploasty, Arhtroscopic rotator cuff repair  and Microfracture humeral head  . SHOULDER ARTHROSCOPY WITH ROTATOR CUFF REPAIR Right 07/04/2012   Procedure: SHOULDER ARTHROSCOPY WITH ROTATOR CUFF REPAIR;  Surgeon: Ninetta Lights, MD;  Location: [place];  Service: Orthopedics;  Laterality: Right;  RIGHT SHOULDER ARTHROSCOPY WITH ARTHROSCOPIC ROTATOR CUFF REPAIR, LYSIS OF ADHESIONS  . TUBAL LIGATION      Social History   Socioeconomic History  . Marital status: Single    Spouse name: Not on file  . Number of children: 2  . Years of education: Not on file  . Highest education level: Not on file  Occupational History  . Occupation: None   Tobacco Use  . Smoking status: Never Smoker  . Smokeless tobacco: Never Used  Substance and Sexual Activity  . Alcohol use: Yes    Comment: glass of wine once a month  . Drug use: No  . Sexual activity: Yes    Birth control/protection: Other-see comments    Comment: BTL  Other Topics Concern  . Not on file  Social History Narrative   Regular Exercise- yes 3/week   Caffeine- no   Social Determinants of Health   Financial Resource Strain:   . Difficulty of Paying Living Expenses: Not on file  Food Insecurity:   . Worried About Charity fundraiser in the Last Year: Not on file  . Ran Out of Food in the Last Year: Not on file  Transportation Needs:   . Lack of Transportation (Medical): Not on file  . Lack of Transportation (Non-Medical): Not on file  Physical Activity:   . Days of Exercise per Week: Not on file  . Minutes of Exercise per Session: Not on file  Stress:   . Feeling of Stress : Not on file  Social Connections:   . Frequency of Communication with Friends and Family: Not on file  . Frequency of Social Gatherings with Friends and Family: Not on file  . Attends Religious Services: Not on file  . Active Member of Clubs or Organizations: Not on file  . Attends Archivist Meetings: Not on file  . Marital Status: Not on file  Intimate Partner Violence:   . Fear of Current or Ex-Partner: Not on file  . Emotionally Abused: Not on file  . Physically Abused: Not on file  . Sexually Abused: Not on file    Family History  Problem Relation Age of Onset  . Coronary artery disease Father 48       heart disease diagnoised  . Hypertension Father   . Heart disease Father   . Colon cancer Father   . Diabetes Father   . Hyperlipidemia Father   . Diabetes Mother   . Hypertension Mother   . Colon cancer Other        great uncle at 3, great aunt dx at 15, GM age 52  . Colon polyps Other        2 sisters dx at age 69 and 62  . Arthritis Sister   . Hypertension  Sister   . Diabetes Sister   . Colon polyps Sister   . Colon cancer Maternal Grandmother   . Ovarian cancer Maternal Grandmother   . Cirrhosis Maternal Grandfather   . Heart disease Paternal Grandmother   . Cervical cancer Sister   . Other Sister        uterine fibroids  . Hypertension Sister   . Diabetes Sister   . Colon polyps Sister   . Colon polyps  Brother   . Breast cancer Neg Hx   . Stroke Neg Hx      Review of Systems  Constitutional: Negative.  Negative for chills and fever.  HENT: Negative.  Negative for sinus pain and sore throat.   Respiratory: Negative.  Negative for cough and shortness of breath.   Cardiovascular: Negative.  Negative for chest pain and palpitations.  Gastrointestinal: Negative.  Negative for abdominal pain, nausea and vomiting.  Genitourinary: Negative.  Negative for dysuria and hematuria.  Skin: Negative.  Negative for rash.  Neurological: Negative.  Negative for dizziness and headaches.  All other systems reviewed and are negative.  Today's Vitals   02/19/20 1433  BP: 119/68  Pulse: 86  Resp: 16  Temp: 98.7 F (37.1 C)  TempSrc: Temporal  SpO2: 99%  Weight: 250 lb (113.4 kg)  Height: 5\' 7"  (1.702 m)   Body mass index is 39.16 kg/m.   Physical Exam Vitals reviewed.  Constitutional:      Appearance: Normal appearance. She is obese.  HENT:     Head: Normocephalic.  Eyes:     Extraocular Movements: Extraocular movements intact.     Conjunctiva/sclera: Conjunctivae normal.     Pupils: Pupils are equal, round, and reactive to light.  Cardiovascular:     Rate and Rhythm: Normal rate and regular rhythm.     Pulses: Normal pulses.     Heart sounds: Normal heart sounds.  Pulmonary:     Effort: Pulmonary effort is normal.     Breath sounds: Normal breath sounds.  Musculoskeletal:        General: Normal range of motion.  Lymphadenopathy:     Cervical: Cervical adenopathy (Left-sided) present.  Skin:    General: Skin is warm and  dry.     Capillary Refill: Capillary refill takes less than 2 seconds.  Neurological:     General: No focal deficit present.     Mental Status: She is alert and oriented to person, place, and time.  Psychiatric:        Mood and Affect: Mood normal.        Behavior: Behavior normal.      ASSESSMENT & PLAN: Kathleen Mcfarland was seen today for lymphadenopathy.  Diagnoses and all orders for this visit:  Head and neck lymphadenopathy -     Ambulatory referral to ENT  Hypothyroidism (acquired)  Lymphadenopathy of left cervical region  HYPERTENSION, BENIGN ESSENTIAL    Patient Instructions       If you have lab work done today you will be contacted with your lab results within the next 2 weeks.  If you have not heard from [place] then please contact us. The fastest way to get your results is to register for My Chart.   IF you received an x-ray today, you will receive an invoice from [facility]. Please contact [facility] at [phone] with questions or concerns regarding your invoice.   IF you received labwork today, you will receive an invoice from [place]. Please contact [facility] at [phone] with questions or concerns regarding your invoice.   Our billing staff will not be able to assist you with questions regarding bills from these companies.  You will be contacted with the lab results as soon as they are available. The fastest way to get your results is to activate your My Chart account. Instructions are located on the last page of this paperwork. If you have not heard from [place] regarding the results in 2 weeks, please contact this  office.        If you have lab work done today you will be contacted with your lab results within the next 2 weeks.  If you have not heard from [place] then please contact us. The fastest way to get your results is to register for My Chart.   IF you received an x-ray today, you will receive an invoice from [facility]. Please  contact [facility] at [phone] with questions or concerns regarding your invoice.   IF you received labwork today, you will receive an invoice from [facility]. Please contact [facility] at [phone] with questions or concerns regarding your invoice.   Our billing staff will not be able to assist you with questions regarding bills from these companies.  You will be contacted with the lab results as soon as they are available. The fastest way to get your results is to activate your My Chart account. Instructions are located on the last page of this paperwork. If you have not heard from [place] regarding the results in 2 weeks, please contact this office.     Health Maintenance, Female Adopting a healthy lifestyle and getting preventive care are important in promoting health and wellness. Ask your health care provider about:  The right schedule for you to have regular tests and exams.  Things you can do on your own to prevent diseases and keep yourself healthy. What should I know about diet, weight, and exercise? Eat a healthy diet   Eat a diet that includes plenty of vegetables, fruits, low-fat dairy products, and lean protein.  Do not eat a lot of foods that are high in solid fats, added sugars, or sodium. Maintain a healthy weight Body mass index (BMI) is used to identify weight problems. It estimates body fat based on height and weight. Your health care provider can help determine your BMI and help you achieve or maintain a healthy weight. Get regular exercise Get regular exercise. This is one of the most important things you can do for your health. Most adults should:  Exercise for at least 150 minutes each week. The exercise should increase your heart rate and make you sweat (moderate-intensity exercise).  Do strengthening exercises at least twice a week. This is in addition to the moderate-intensity exercise.  Spend less time sitting. Even light physical activity can be  beneficial. Watch cholesterol and blood lipids Have your blood tested for lipids and cholesterol at 58 years of age, then have this test every 5 years. Have your cholesterol levels checked more often if:  Your lipid or cholesterol levels are high.  You are older than 58 years of age.  You are at high risk for heart disease. What should I know about cancer screening? Depending on your health history and family history, you may need to have cancer screening at various ages. This may include screening for:  Breast cancer.  Cervical cancer.  Colorectal cancer.  Skin cancer.  Lung cancer. What should I know about heart disease, diabetes, and high blood pressure? Blood pressure and heart disease  High blood pressure causes heart disease and increases the risk of stroke. This is more likely to develop in people who have high blood pressure readings, are of African descent, or are overweight.  Have your blood pressure checked: ? Every 3-5 years if you are 67-32 years of age. ? Every year if you are 30 years old or older. Diabetes Have regular diabetes screenings. This checks your fasting blood sugar level. Have the  screening done:  Once every three years after age 28 if you are at a normal weight and have a low risk for diabetes.  More often and at a younger age if you are overweight or have a high risk for diabetes. What should I know about preventing infection? Hepatitis B If you have a higher risk for hepatitis B, you should be screened for this virus. Talk with your health care provider to find out if you are at risk for hepatitis B infection. Hepatitis C Testing is recommended for:  Everyone born from [birth year] through 1964.  Anyone with known risk factors for hepatitis C. Sexually transmitted infections (STIs)  Get screened for STIs, including gonorrhea and chlamydia, if: ? You are sexually active and are younger than 58 years of age. ? You are older than 58 years of age and  your health care provider tells you that you are at risk for this type of infection. ? Your sexual activity has changed since you were last screened, and you are at increased risk for chlamydia or gonorrhea. Ask your health care provider if you are at risk.  Ask your health care provider about whether you are at high risk for HIV. Your health care provider may recommend a prescription medicine to help prevent HIV infection. If you choose to take medicine to prevent HIV, you should first get tested for HIV. You should then be tested every 3 months for as long as you are taking the medicine. Pregnancy  If you are about to stop having your period (premenopausal) and you may become pregnant, seek counseling before you get pregnant.  Take 400 to 800 micrograms (mcg) of folic acid every day if you become pregnant.  Ask for birth control (contraception) if you want to prevent pregnancy. Osteoporosis and menopause Osteoporosis is a disease in which the bones lose minerals and strength with aging. This can result in bone fractures. If you are 49 years old or older, or if you are at risk for osteoporosis and fractures, ask your health care provider if you should:  Be screened for bone loss.  Take a calcium or vitamin D supplement to lower your risk of fractures.  Be given hormone replacement therapy (HRT) to treat symptoms of menopause. Follow these instructions at home: Lifestyle  Do not use any products that contain nicotine or tobacco, such as cigarettes, e-cigarettes, and chewing tobacco. If you need help quitting, ask your health care provider.  Do not use street drugs.  Do not share needles.  Ask your health care provider for help if you need support or information about quitting drugs. Alcohol use  Do not drink alcohol if: ? Your health care provider tells you not to drink. ? You are pregnant, may be pregnant, or are planning to become pregnant.  If you drink alcohol: ? Limit how  much you use to 0-1 drink a day. ? Limit intake if you are breastfeeding.  Be aware of how much alcohol is in your drink. In the [place] one drink equals one 12 oz bottle of beer (355 mL), one 5 oz glass of wine (148 mL), or one 1 oz glass of hard liquor (44 mL). General instructions  Schedule regular health, dental, and eye exams.  Stay current with your vaccines.  Tell your health care provider if: ? You often feel depressed. ? You have ever been abused or do not feel safe at home. Summary  Adopting a healthy lifestyle and getting preventive care are important  in promoting health and wellness.  Follow your health care provider's instructions about healthy diet, exercising, and getting tested or screened for diseases.  Follow your health care provider's instructions on monitoring your cholesterol and blood pressure. This information is not intended to replace advice given to you by your health care provider. Make sure you discuss any questions you have with your health care provider. Document Revised: 04/10/2018 Document Reviewed: 04/10/2018 Elsevier Patient Education  2020 [employer]      Agustina Caroli, MD [facility]

## 2020-02-19 NOTE — Telephone Encounter (Signed)
Called patient to review medication and pharmacy, left message in voice mail to return call. If unable contact me just leave a message.

## 2020-02-19 NOTE — Patient Instructions (Addendum)
If you have lab work done today you will be contacted with your lab results within the next 2 weeks.  If you have not heard from [place] then please contact us. The fastest way to get your results is to register for My Chart.   IF you received an x-ray today, you will receive an invoice from [facility]. Please contact [facility] at [phone] with questions or concerns regarding your invoice.   IF you received labwork today, you will receive an invoice from [facility]. Please contact [facility] at [phone] with questions or concerns regarding your invoice.   Our billing staff will not be able to assist you with questions regarding bills from these companies.  You will be contacted with the lab results as soon as they are available. The fastest way to get your results is to activate your My Chart account. Instructions are located on the last page of this paperwork. If you have not heard from [place] regarding the results in 2 weeks, please contact this office.        If you have lab work done today you will be contacted with your lab results within the next 2 weeks.  If you have not heard from [place] then please contact us. The fastest way to get your results is to register for My Chart.   IF you received an x-ray today, you will receive an invoice from [facility]. Please contact [facility]iology at [phone] with questions or concerns regarding your invoice.   IF you received labwork today, you will receive an invoice from [facility]. Please contact [facility] at [phone] with questions or concerns regarding your invoice.   Our billing staff will not be able to assist you with questions regarding bills from these companies.  You will be contacted with the lab results as soon as they are available. The fastest way to get your results is to activate your My Chart account. Instructions are located on the last page of this paperwork. If you have not heard from [place]  regarding the results in 2 weeks, please contact this office.     Health Maintenance, Female Adopting a healthy lifestyle and getting preventive care are important in promoting health and wellness. Ask your health care provider about:  The right schedule for you to have regular tests and exams.  Things you can do on your own to prevent diseases and keep yourself healthy. What should I know about diet, weight, and exercise? Eat a healthy diet   Eat a diet that includes plenty of vegetables, fruits, low-fat dairy products, and lean protein.  Do not eat a lot of foods that are high in solid fats, added sugars, or sodium. Maintain a healthy weight Body mass index (BMI) is used to identify weight problems. It estimates body fat based on height and weight. Your health care provider can help determine your BMI and help you achieve or maintain a healthy weight. Get regular exercise Get regular exercise. This is one of the most important things you can do for your health. Most adults should:  Exercise for at least 150 minutes each week. The exercise should increase your heart rate and make you sweat (moderate-intensity exercise).  Do strengthening exercises at least twice a week. This is in addition to the moderate-intensity exercise.  Spend less time sitting. Even light physical activity can be beneficial. Watch cholesterol and blood lipids Have your blood tested for lipids and cholesterol at 58 years of age, then have this  test every 5 years. Have your cholesterol levels checked more often if:  Your lipid or cholesterol levels are high.  You are older than 58 years of age.  You are at high risk for heart disease. What should I know about cancer screening? Depending on your health history and family history, you may need to have cancer screening at various ages. This may include screening for:  Breast cancer.  Cervical cancer.  Colorectal cancer.  Skin cancer.  Lung  cancer. What should I know about heart disease, diabetes, and high blood pressure? Blood pressure and heart disease  High blood pressure causes heart disease and increases the risk of stroke. This is more likely to develop in people who have high blood pressure readings, are of African descent, or are overweight.  Have your blood pressure checked: ? Every 3-5 years if you are 9-34 years of age. ? Every year if you are 70 years old or older. Diabetes Have regular diabetes screenings. This checks your fasting blood sugar level. Have the screening done:  Once every three years after age 25 if you are at a normal weight and have a low risk for diabetes.  More often and at a younger age if you are overweight or have a high risk for diabetes. What should I know about preventing infection? Hepatitis B If you have a higher risk for hepatitis B, you should be screened for this virus. Talk with your health care provider to find out if you are at risk for hepatitis B infection. Hepatitis C Testing is recommended for:  Everyone born from [birth year] through 1964.  Anyone with known risk factors for hepatitis C. Sexually transmitted infections (STIs)  Get screened for STIs, including gonorrhea and chlamydia, if: ? You are sexually active and are younger than 58 years of age. ? You are older than 58 years of age and your health care provider tells you that you are at risk for this type of infection. ? Your sexual activity has changed since you were last screened, and you are at increased risk for chlamydia or gonorrhea. Ask your health care provider if you are at risk.  Ask your health care provider about whether you are at high risk for HIV. Your health care provider may recommend a prescription medicine to help prevent HIV infection. If you choose to take medicine to prevent HIV, you should first get tested for HIV. You should then be tested every 3 months for as long as you are taking the  medicine. Pregnancy  If you are about to stop having your period (premenopausal) and you may become pregnant, seek counseling before you get pregnant.  Take 400 to 800 micrograms (mcg) of folic acid every day if you become pregnant.  Ask for birth control (contraception) if you want to prevent pregnancy. Osteoporosis and menopause Osteoporosis is a disease in which the bones lose minerals and strength with aging. This can result in bone fractures. If you are 38 years old or older, or if you are at risk for osteoporosis and fractures, ask your health care provider if you should:  Be screened for bone loss.  Take a calcium or vitamin D supplement to lower your risk of fractures.  Be given hormone replacement therapy (HRT) to treat symptoms of menopause. Follow these instructions at home: Lifestyle  Do not use any products that contain nicotine or tobacco, such as cigarettes, e-cigarettes, and chewing tobacco. If you need help quitting, ask your health care provider.  Do  not use street drugs.  Do not share needles.  Ask your health care provider for help if you need support or information about quitting drugs. Alcohol use  Do not drink alcohol if: ? Your health care provider tells you not to drink. ? You are pregnant, may be pregnant, or are planning to become pregnant.  If you drink alcohol: ? Limit how much you use to 0-1 drink a day. ? Limit intake if you are breastfeeding.  Be aware of how much alcohol is in your drink. In the [place] one drink equals one 12 oz bottle of beer (355 mL), one 5 oz glass of wine (148 mL), or one 1 oz glass of hard liquor (44 mL). General instructions  Schedule regular health, dental, and eye exams.  Stay current with your vaccines.  Tell your health care provider if: ? You often feel depressed. ? You have ever been abused or do not feel safe at home. Summary  Adopting a healthy lifestyle and getting preventive care are important in  promoting health and wellness.  Follow your health care provider's instructions about healthy diet, exercising, and getting tested or screened for diseases.  Follow your health care provider's instructions on monitoring your cholesterol and blood pressure. This information is not intended to replace advice given to you by your health care provider. Make sure you discuss any questions you have with your health care provider. Document Revised: 04/10/2018 Document Reviewed: 04/10/2018 Elsevier Patient Education  2020 [employer].

## 2020-02-27 NOTE — Telephone Encounter (Signed)
I will call pt.  

## 2020-03-01 ENCOUNTER — Telehealth: Payer: Self-pay | Admitting: Emergency Medicine

## 2020-03-01 ENCOUNTER — Ambulatory Visit (INDEPENDENT_AMBULATORY_CARE_PROVIDER_SITE_OTHER): Payer: Medicare HMO | Admitting: Otolaryngology

## 2020-03-01 NOTE — Telephone Encounter (Signed)
Pt wants recent  ultrasound   Results faxed to [facility] Dr. Vivia Ewing  Phone # [phone] and fax number is [phone]  / and pt She has an appt next Thursday .

## 2020-03-02 NOTE — Telephone Encounter (Signed)
Patient needs to contact [facility] for release of record. Cannot be released by [place].

## 2020-03-08 DIAGNOSIS — R7303 Prediabetes: Secondary | ICD-10-CM | POA: Diagnosis not present

## 2020-03-08 DIAGNOSIS — Z6838 Body mass index (BMI) 38.0-38.9, adult: Secondary | ICD-10-CM | POA: Diagnosis not present

## 2020-03-08 NOTE — Telephone Encounter (Signed)
Pt called about signing a release and I told pt that pt would need to call [facility] to send that release off to [facility]. Pt was not happy about the misunderstanding but stated she would contact them. Please advise.

## 2020-03-08 NOTE — Telephone Encounter (Signed)
Noted  

## 2020-03-11 DIAGNOSIS — R591 Generalized enlarged lymph nodes: Secondary | ICD-10-CM | POA: Insufficient documentation

## 2020-03-11 DIAGNOSIS — R221 Localized swelling, mass and lump, neck: Secondary | ICD-10-CM | POA: Insufficient documentation

## 2020-03-12 ENCOUNTER — Telehealth: Payer: Self-pay | Admitting: Orthopedic Surgery

## 2020-03-12 NOTE — Telephone Encounter (Signed)
Pt called asking to speak with the office manager and stated she didn't want to let her know what it would be in regards to   [phone]

## 2020-03-12 NOTE — Telephone Encounter (Signed)
IC s/w patient. She was wanting to know if Dr Marlou Sa would be willing to see her for her shoulder. She was previously seen at Dr Penelope Coop office and would like to transfer her care here. I advised that this would be fine. She will call us back to schedule an appointment when she is ready.  She will sign a release form at her appointment for [place] to get her records from [facility]. We will not need records prior to appt. See Ander Purpura F with questions.

## 2020-03-16 ENCOUNTER — Telehealth: Payer: Self-pay | Admitting: Family Medicine

## 2020-03-16 NOTE — Telephone Encounter (Signed)
I left [facility] in [place] a vm in the medical record dept with a question. Do I need to do the release they sent PCP for pt or can their dept-[facility] see pt's medical record from PCP in Epic?

## 2020-03-24 ENCOUNTER — Ambulatory Visit: Payer: Medicare HMO | Admitting: Family Medicine

## 2020-03-30 NOTE — Telephone Encounter (Signed)
I called again checking on status of this message.

## 2020-03-31 ENCOUNTER — Telehealth: Payer: Self-pay | Admitting: Family Medicine

## 2020-03-31 ENCOUNTER — Other Ambulatory Visit: Payer: Self-pay

## 2020-03-31 ENCOUNTER — Encounter: Payer: Self-pay | Admitting: Family Medicine

## 2020-03-31 ENCOUNTER — Ambulatory Visit (INDEPENDENT_AMBULATORY_CARE_PROVIDER_SITE_OTHER): Payer: Medicare HMO | Admitting: Family Medicine

## 2020-03-31 VITALS — BP 136/82 | HR 89 | Ht 67.5 in | Wt 248.0 lb

## 2020-03-31 DIAGNOSIS — E669 Obesity, unspecified: Secondary | ICD-10-CM | POA: Diagnosis not present

## 2020-03-31 DIAGNOSIS — R7303 Prediabetes: Secondary | ICD-10-CM | POA: Diagnosis not present

## 2020-03-31 DIAGNOSIS — E039 Hypothyroidism, unspecified: Secondary | ICD-10-CM | POA: Diagnosis not present

## 2020-03-31 DIAGNOSIS — I10 Essential (primary) hypertension: Secondary | ICD-10-CM

## 2020-03-31 DIAGNOSIS — E559 Vitamin D deficiency, unspecified: Secondary | ICD-10-CM | POA: Diagnosis not present

## 2020-03-31 DIAGNOSIS — F333 Major depressive disorder, recurrent, severe with psychotic symptoms: Secondary | ICD-10-CM

## 2020-03-31 DIAGNOSIS — Z86010 Personal history of colonic polyps: Secondary | ICD-10-CM | POA: Diagnosis not present

## 2020-03-31 MED ORDER — BACLOFEN 10 MG PO TABS: 5.0000 mg | ORAL_TABLET | Freq: Two times a day (BID) | ORAL | 11 refills | Status: DC | PRN

## 2020-03-31 MED ORDER — LIOTHYRONINE SODIUM 5 MCG PO TABS: 5.0000 ug | ORAL_TABLET | Freq: Every day | ORAL | 6 refills | Status: DC

## 2020-03-31 MED ORDER — CETIRIZINE HCL 10 MG PO TABS
10.0000 mg | ORAL_TABLET | Freq: Every day | ORAL | 3 refills | Status: DC
Start: 2020-03-31 — End: 2021-10-18

## 2020-03-31 NOTE — Progress Notes (Signed)
Office Visit Note   Patient: Kathleen Mcfarland           Date of Birth: 1960-05-17           MRN: [id number] Visit Date: 03/31/2020 Requested by: No referring provider defined for this encounter. PCP: No primary care provider on file.  Subjective: Chief Complaint  Patient presents with  . establish primary care    HPI: She is here to reestablish care.  She is a former patient of mine.  Her primary concern today is orthopedic issues.  She has a longstanding history of arthritis affecting her knees, and troubles with her shoulders.  She has had multiple shoulder surgeries.  She is also concerned about a lymph node on the left side of her neck that seems to be getting bigger.  She is scheduled to have a CT scan later this month further evaluate it.  Initial ultrasound imaging showed probable benign lymph node, but she is concerned that it seems to be getting bigger.  She has struggled with her weight for a long time.  She is treated for hypothyroidism with Synthroid 200 mcg daily.  Her most recent TSH was 2.88.  She continues to have sluggishness.  Hypertension has been well controlled.  She has prediabetes and is trying to eat more healthfully.  She is up-to-date on health maintenance issues.  She has a history of colon polyps.                ROS:   All other systems were reviewed and are negative.  Objective: Vital Signs: BP 136/82   Pulse 89   Ht 5' 7.5" (1.715 m)   Wt 248 lb (112.5 kg)   LMP 05/31/2018 (Exact Date)   BMI 38.27 kg/m   Physical Exam:  General:  Alert and oriented, in no acute distress. Pulm:  Breathing unlabored. Psy:  Normal mood, congruent affect.  HEENT: No thyromegaly.  She has a slightly enlarged lymph node on the posterior lateral aspect of her neck, left side. CV: Regular rate and rhythm without murmurs, rubs, or gallops.  No peripheral edema.  2+ radial and posterior tibial pulses. Lungs: Clear to auscultation throughout with no wheezing  or areas of consolidation. Extremities: 1+ upper and lower DTRs.  No nail deformities.   Imaging: No results found.  Assessment & Plan: 1.  Enlarged lymph node left side of neck -CT later this month as scheduled.  Probable excisional biopsy if it continues to enlarge, or if CT shows suspicious findings.  2.  Hypothyroidism -We will add Cytomel 5 mcg daily.  Target TSH will be 0.5-1.0.  Recheck thyroid panel in about 8 weeks.  3.  Obesity -If she does not start to lose weight, consider bariatric referral.  She is already working with a nutritionist long-term.  4.  Osteoarthritis  5.  Prediabetes -Lab monitoring periodically.     Procedures: No procedures performed  No notes on file     PMFS History: Patient Active Problem List   Diagnosis Date Noted  . Vitamin D deficiency 08/08/2019  . Bilateral lower extremity edema 08/08/2019  . Pre-diabetes 06/04/2017  . Menopausal syndrome (hot flashes) 06/04/2017  . History of colonic polyps 06/04/2017  . Chronic use of opiate for therapeutic purpose 07/03/2016  . Disorder of rotator cuff of both shoulders 05/19/2015  . Obesity (BMI 30-39.9) 05/19/2015  . Severe recurrent major depressive disorder with psychotic features ([place]) 01/03/2014  . Depression 01/02/2014  . Urinary incontinence 07/21/2010  .  HYPERTENSION, BENIGN ESSENTIAL 07/31/2006  . Hypothyroidism (acquired) 06/23/2006  . Seasonal allergies 06/23/2006  . Osteoarthrosis involving lower leg 06/23/2006   Past Medical History:  Diagnosis Date  . Allergic rhinitis   . Anxiety   . Back pain   . Cataract   . Complication of anesthesia    hard to wake up  . Depression   . Hypertension   . Hypothyroidism   . Osteoarthritis 2012   bilateral knees  . Osteoarthritis of knee   . Osteoporosis     Family History  Problem Relation Age of Onset  . Coronary artery disease Father 80       heart disease diagnoised  . Hypertension Father   . Heart disease Father   .  Colon cancer Father   . Diabetes Father   . Hyperlipidemia Father   . Diabetes Mother   . Hypertension Mother   . Colon cancer Other        great uncle at 41, great aunt dx at 71, GM age 43  . Colon polyps Other        2 sisters dx at age 21 and 32  . Arthritis Sister   . Hypertension Sister   . Diabetes Sister   . Colon polyps Sister   . Colon cancer Maternal Grandmother   . Ovarian cancer Maternal Grandmother   . Dislocations Maternal Grandmother   . Cirrhosis Maternal Grandfather   . Heart disease Paternal Grandmother   . Coronary artery disease Paternal Grandmother   . Cancer Paternal Grandmother   . Cervical cancer Sister   . Other Sister        uterine fibroids  . Hypertension Sister   . Diabetes Sister   . Colon polyps Sister   . Colon polyps Brother   . Breast cancer Neg Hx   . Stroke Neg Hx     Past Surgical History:  Procedure Laterality Date  . colonscopy     . DILATION AND CURETTAGE OF UTERUS    . ROTATOR CUFF REPAIR    . SHOULDER ARTHROSCOPY  04/13/2011   Procedure: ARTHROSCOPY SHOULDER;  Surgeon: Ninetta Lights, MD;  Location: [place];  Service: Orthopedics;  Laterality: Right;  Right Shoulder Arthroscopy with Acrmioploasty, Arhtroscopic rotator cuff repair and Microfracture humeral head  . SHOULDER ARTHROSCOPY WITH ROTATOR CUFF REPAIR Right 07/04/2012   Procedure: SHOULDER ARTHROSCOPY WITH ROTATOR CUFF REPAIR;  Surgeon: Ninetta Lights, MD;  Location: [facility];  Service: Orthopedics;  Laterality: Right;  RIGHT SHOULDER ARTHROSCOPY WITH ARTHROSCOPIC ROTATOR CUFF REPAIR, LYSIS OF ADHESIONS  . TUBAL LIGATION     Social History   Occupational History  . Occupation: None  Tobacco Use  . Smoking status: Never Smoker  . Smokeless tobacco: Never Used  Substance and Sexual Activity  . Alcohol use: Yes    Comment: glass of wine once a month  . Drug use: No  . Sexual activity: Yes    Birth control/protection: Other-see  comments    Comment: BTL

## 2020-03-31 NOTE — Telephone Encounter (Signed)
The patient says she can only take brand name thyroid medications. The generic cytomel was sent in to [facility]. 2nd, the patient would like to know if Dr. Junius Roads agrees with what she says Dr. Pamella Pert had told her about the covid vaccine -- that she should not take it because of the issue with her lymph nodes, not until after the CT results at least.   Please advise.

## 2020-03-31 NOTE — Telephone Encounter (Signed)
Patient called. She says she can not take kiothyronine. She would like a call back. [phone]

## 2020-04-01 MED ORDER — CYTOMEL 5 MCG PO TABS: 5.0000 ug | ORAL_TABLET | Freq: Every day | ORAL | 3 refills | Status: DC

## 2020-04-01 NOTE — Telephone Encounter (Signed)
Rx sent.  Ok to wait on vaccine.

## 2020-04-01 NOTE — Telephone Encounter (Signed)
I called and advised the patient. I also advised her of the prior authorization that has been generated for the brand name Cytomel. The patient says she was advised to always take brand name thyroid medications, post thyroidectomy. The pharmacy or I will advise the patient of the outcome.

## 2020-04-05 DIAGNOSIS — Z6838 Body mass index (BMI) 38.0-38.9, adult: Secondary | ICD-10-CM | POA: Diagnosis not present

## 2020-04-05 DIAGNOSIS — E559 Vitamin D deficiency, unspecified: Secondary | ICD-10-CM | POA: Diagnosis not present

## 2020-04-16 ENCOUNTER — Telehealth: Payer: Self-pay | Admitting: Family Medicine

## 2020-04-16 NOTE — Telephone Encounter (Signed)
I called the patient - she will be here at 11:00 on Monday. Instructed her to take only 1 BP pill a day. Rest over the weekend.

## 2020-04-16 NOTE — Telephone Encounter (Signed)
Ok to double-book at 11:00 for a BP check.  She should keep taking her meds daily.

## 2020-04-16 NOTE — Telephone Encounter (Signed)
The patient called quite concerned about her BP and pulse the last couple weeks: systolic running 460 - 479, diastolic 98-72 (but she says it has been around 90 at times), and pulse is staying in the 90s all the time and she feels hot. She has taken her triamterene-hctz bid a few times and then rested (did not recheck the BP).   She would like to be seen on Monday - schedule is 100% full. Please advise if there is a time we can find for her on Monday.

## 2020-04-16 NOTE — Telephone Encounter (Signed)
Patient called stating that her BP is elevated. Patient states it keep going up and is up to 2 BP pills when she is only to take one. Please call patient to set an appt she is asking for Monday at [phone].

## 2020-04-19 ENCOUNTER — Ambulatory Visit (INDEPENDENT_AMBULATORY_CARE_PROVIDER_SITE_OTHER): Payer: Medicare HMO | Admitting: Family Medicine

## 2020-04-19 ENCOUNTER — Encounter: Payer: Self-pay | Admitting: Family Medicine

## 2020-04-19 ENCOUNTER — Other Ambulatory Visit: Payer: Self-pay

## 2020-04-19 VITALS — BP 120/73 | HR 84

## 2020-04-19 DIAGNOSIS — I10 Essential (primary) hypertension: Secondary | ICD-10-CM | POA: Diagnosis not present

## 2020-04-19 NOTE — Progress Notes (Signed)
Office Visit Note   Patient: Kathleen Mcfarland           Date of Birth: 1960/05/24           MRN: [id number] Visit Date: 04/19/2020 Requested by: Eunice Blase, MD [address] [place],  [place] [address] PCP: Eunice Blase, MD  Subjective: Chief Complaint  Patient presents with  . Blood Pressure Check    Readings have been high lately, along with her pulse. Has had an occasion of ringing in the right ear, with nausea. "I feel hot" especially at night. Has been taking her BP med bid x 2 weeks - stopped this last Friday, as advised.    HPI: She is here with blood pressure concerns.  For the past couple weeks her readings have been running very high.  She has had systolic readings of 638-937.  On her own she started taking 2 of her blood pressure pills instead of 1, she was having some headaches.  She also went to a nutritionist and had her blood pressure checked in there, it was elevated at that office as well.  Over the weekend she has been taking 1 pill instead of 2 at our recommendation.  She has not checked her blood pressure at home today.  She denies any headaches or chest pain today.  She has been under a lot of stress recently due to her father's health.  She has not yet started Cytomel.               ROS:   All other systems were reviewed and are negative.  Objective: Vital Signs: BP 120/73   Pulse 84   LMP 05/31/2018 (Exact Date)   Physical Exam:  General:  Alert and oriented, in no acute distress. Pulm:  Breathing unlabored. Psy:  Normal mood, congruent affect.  CV: Regular rate and rhythm without murmurs, rubs, or gallops.  No peripheral edema.  2+ radial and posterior tibial pulses. Lungs: Clear to auscultation throughout with no wheezing or areas of consolidation.    Imaging: No results found.  Assessment & Plan: 1.  Elevated blood pressure, normal today. -No changes in medication regimen.  She will bring her blood pressure cuff in for calibration.  If  her blood pressure machine is reading accurately, and her blood pressure readings remain high at home, we may need to make a change in her regimen.     Procedures: No procedures performed        PMFS History: Patient Active Problem List   Diagnosis Date Noted  . Vitamin D deficiency 08/08/2019  . Bilateral lower extremity edema 08/08/2019  . Pre-diabetes 06/04/2017  . Menopausal syndrome (hot flashes) 06/04/2017  . History of colonic polyps 06/04/2017  . Chronic use of opiate for therapeutic purpose 07/03/2016  . Disorder of rotator cuff of both shoulders 05/19/2015  . Obesity (BMI 30-39.9) 05/19/2015  . Severe recurrent major depressive disorder with psychotic features ([place]) 01/03/2014  . Depression 01/02/2014  . Urinary incontinence 07/21/2010  . HYPERTENSION, BENIGN ESSENTIAL 07/31/2006  . Hypothyroidism (acquired) 06/23/2006  . Seasonal allergies 06/23/2006  . Osteoarthrosis involving lower leg 06/23/2006   Past Medical History:  Diagnosis Date  . Allergic rhinitis   . Anxiety   . Back pain   . Cataract   . Complication of anesthesia    hard to wake up  . Depression   . Hypertension   . Hypothyroidism   . Osteoarthritis 2012   bilateral knees  . Osteoarthritis of  knee   . Osteoporosis     Family History  Problem Relation Age of Onset  . Coronary artery disease Father 7       heart disease diagnoised  . Hypertension Father   . Heart disease Father   . Colon cancer Father   . Diabetes Father   . Hyperlipidemia Father   . Diabetes Mother   . Hypertension Mother   . Colon cancer Other        great uncle at 58, great aunt dx at 82, GM age 11  . Colon polyps Other        2 sisters dx at age 51 and 26  . Arthritis Sister   . Hypertension Sister   . Diabetes Sister   . Colon polyps Sister   . Colon cancer Maternal Grandmother   . Ovarian cancer Maternal Grandmother   . Dislocations Maternal Grandmother   . Cirrhosis Maternal Grandfather   . Heart  disease Paternal Grandmother   . Coronary artery disease Paternal Grandmother   . Cancer Paternal Grandmother   . Cervical cancer Sister   . Other Sister        uterine fibroids  . Hypertension Sister   . Diabetes Sister   . Colon polyps Sister   . Colon polyps Brother   . Breast cancer Neg Hx   . Stroke Neg Hx     Past Surgical History:  Procedure Laterality Date  . colonscopy     . DILATION AND CURETTAGE OF UTERUS    . ROTATOR CUFF REPAIR    . SHOULDER ARTHROSCOPY  04/13/2011   Procedure: ARTHROSCOPY SHOULDER;  Surgeon: Ninetta Lights, MD;  Location: [place];  Service: Orthopedics;  Laterality: Right;  Right Shoulder Arthroscopy with Acrmioploasty, Arhtroscopic rotator cuff repair and Microfracture humeral head  . SHOULDER ARTHROSCOPY WITH ROTATOR CUFF REPAIR Right 07/04/2012   Procedure: SHOULDER ARTHROSCOPY WITH ROTATOR CUFF REPAIR;  Surgeon: Ninetta Lights, MD;  Location: [place];  Service: Orthopedics;  Laterality: Right;  RIGHT SHOULDER ARTHROSCOPY WITH ARTHROSCOPIC ROTATOR CUFF REPAIR, LYSIS OF ADHESIONS  . TUBAL LIGATION     Social History   Occupational History  . Occupation: None  Tobacco Use  . Smoking status: Never Smoker  . Smokeless tobacco: Never Used  Substance and Sexual Activity  . Alcohol use: Yes    Comment: glass of wine once a month  . Drug use: No  . Sexual activity: Yes    Birth control/protection: Other-see comments    Comment: BTL

## 2020-04-29 DIAGNOSIS — R221 Localized swelling, mass and lump, neck: Secondary | ICD-10-CM | POA: Diagnosis not present

## 2020-05-14 ENCOUNTER — Ambulatory Visit (INDEPENDENT_AMBULATORY_CARE_PROVIDER_SITE_OTHER): Payer: Medicare HMO | Admitting: Family Medicine

## 2020-05-14 ENCOUNTER — Other Ambulatory Visit: Payer: Self-pay

## 2020-05-14 ENCOUNTER — Encounter: Payer: Self-pay | Admitting: Family Medicine

## 2020-05-14 VITALS — BP 132/81 | HR 96

## 2020-05-14 DIAGNOSIS — I10 Essential (primary) hypertension: Secondary | ICD-10-CM

## 2020-05-14 DIAGNOSIS — R221 Localized swelling, mass and lump, neck: Secondary | ICD-10-CM

## 2020-05-14 MED ORDER — TRIAMTERENE-HCTZ 37.5-25 MG PO TABS: ORAL_TABLET | ORAL | 1 refills | Status: DC

## 2020-05-14 NOTE — Progress Notes (Signed)
Office Visit Note   Patient: Angeliah Mcfarland           Date of Birth: 1960-09-08           MRN: [id number] Visit Date: 05/14/2020 Requested by: Eunice Blase, MD [address] [place],  [place] [address] PCP: Eunice Blase, MD  Subjective: Chief Complaint  Patient presents with  . Other    Discuss results of thyroid ultrasound from October  Still unsure about getting the covid vaccine  Needs her BP meds refilled    HPI: She is here with persistent left-sided neck mass.  She went to an ENT provider and was told that it looked benign.  She did not feel a good connection with that doctor.  She feels like it has gotten even bigger since the last time it was evaluated, and she would like another opinion.  She needs a refill of blood pressure medicine.  Also, she has not yet started Cytomel.  She wanted to be sure she was given brand-name rather than generic.                ROS:   All other systems were reviewed and are negative.  Objective: Vital Signs: BP 132/81   Pulse 96   LMP 05/31/2018 (Exact Date)   Physical Exam:  General:  Alert and oriented, in no acute distress. Pulm:  Breathing unlabored. Psy:  Normal mood, congruent affect.  Neck: In the left lateral neck she has a palpable nodule.  It feels a little bigger than when I felt it in December.    Imaging: No results found.  Assessment & Plan: 1. left sided neck mass -Referral to Dr. Benjamine Mola for a second opinion regarding possible excisional biopsy.  2.  Hypothyroidism -Reassurance that it does appear she was given brand-name Cytomel.  We will start taking it.     Procedures: No procedures performed        PMFS History: Patient Active Problem List   Diagnosis Date Noted  . Vitamin D deficiency 08/08/2019  . Bilateral lower extremity edema 08/08/2019  . Pre-diabetes 06/04/2017  . Menopausal syndrome (hot flashes) 06/04/2017  . History of colonic polyps 06/04/2017  . Chronic use of opiate for  therapeutic purpose 07/03/2016  . Disorder of rotator cuff of both shoulders 05/19/2015  . Obesity (BMI 30-39.9) 05/19/2015  . Severe recurrent major depressive disorder with psychotic features ([place]) 01/03/2014  . Depression 01/02/2014  . Urinary incontinence 07/21/2010  . HYPERTENSION, BENIGN ESSENTIAL 07/31/2006  . Hypothyroidism (acquired) 06/23/2006  . Seasonal allergies 06/23/2006  . Osteoarthrosis involving lower leg 06/23/2006   Past Medical History:  Diagnosis Date  . Allergic rhinitis   . Anxiety   . Back pain   . Cataract   . Complication of anesthesia    hard to wake up  . Depression   . Hypertension   . Hypothyroidism   . Osteoarthritis 2012   bilateral knees  . Osteoarthritis of knee   . Osteoporosis     Family History  Problem Relation Age of Onset  . Coronary artery disease Father 26       heart disease diagnoised  . Hypertension Father   . Heart disease Father   . Colon cancer Father   . Diabetes Father   . Hyperlipidemia Father   . Diabetes Mother   . Hypertension Mother   . Colon cancer Other        great uncle at 64, great aunt dx at 35, GM  age 22  . Colon polyps Other        2 sisters dx at age 74 and 49  . Arthritis Sister   . Hypertension Sister   . Diabetes Sister   . Colon polyps Sister   . Colon cancer Maternal Grandmother   . Ovarian cancer Maternal Grandmother   . Dislocations Maternal Grandmother   . Cirrhosis Maternal Grandfather   . Heart disease Paternal Grandmother   . Coronary artery disease Paternal Grandmother   . Cancer Paternal Grandmother   . Cervical cancer Sister   . Other Sister        uterine fibroids  . Hypertension Sister   . Diabetes Sister   . Colon polyps Sister   . Colon polyps Brother   . Breast cancer Neg Hx   . Stroke Neg Hx     Past Surgical History:  Procedure Laterality Date  . colonscopy     . DILATION AND CURETTAGE OF UTERUS    . ROTATOR CUFF REPAIR    . SHOULDER ARTHROSCOPY  04/13/2011    Procedure: ARTHROSCOPY SHOULDER;  Surgeon: Ninetta Lights, MD;  Location: [place] [facility];  Service: Orthopedics;  Laterality: Right;  Right Shoulder Arthroscopy with Acrmioploasty, Arhtroscopic rotator cuff repair and Microfracture humeral head  . SHOULDER ARTHROSCOPY WITH ROTATOR CUFF REPAIR Right 07/04/2012   Procedure: SHOULDER ARTHROSCOPY WITH ROTATOR CUFF REPAIR;  Surgeon: Ninetta Lights, MD;  Location: [facility];  Service: Orthopedics;  Laterality: Right;  RIGHT SHOULDER ARTHROSCOPY WITH ARTHROSCOPIC ROTATOR CUFF REPAIR, LYSIS OF ADHESIONS  . TUBAL LIGATION     Social History   Occupational History  . Occupation: None  Tobacco Use  . Smoking status: Never Smoker  . Smokeless tobacco: Never Used  Substance and Sexual Activity  . Alcohol use: Yes    Comment: glass of wine once a month  . Drug use: No  . Sexual activity: Yes    Birth control/protection: Other-see comments    Comment: BTL

## 2020-06-09 DIAGNOSIS — R590 Localized enlarged lymph nodes: Secondary | ICD-10-CM | POA: Diagnosis not present

## 2020-06-10 ENCOUNTER — Other Ambulatory Visit (HOSPITAL_COMMUNITY): Payer: Self-pay | Admitting: Otolaryngology

## 2020-06-10 DIAGNOSIS — R590 Localized enlarged lymph nodes: Secondary | ICD-10-CM

## 2020-06-14 ENCOUNTER — Other Ambulatory Visit: Payer: Self-pay | Admitting: Family Medicine

## 2020-06-14 DIAGNOSIS — H524 Presbyopia: Secondary | ICD-10-CM | POA: Diagnosis not present

## 2020-06-14 DIAGNOSIS — Z1231 Encounter for screening mammogram for malignant neoplasm of breast: Secondary | ICD-10-CM

## 2020-07-12 DIAGNOSIS — H524 Presbyopia: Secondary | ICD-10-CM | POA: Diagnosis not present

## 2020-07-12 DIAGNOSIS — H52209 Unspecified astigmatism, unspecified eye: Secondary | ICD-10-CM | POA: Diagnosis not present

## 2020-07-12 DIAGNOSIS — H5203 Hypermetropia, bilateral: Secondary | ICD-10-CM | POA: Diagnosis not present

## 2020-07-14 ENCOUNTER — Ambulatory Visit: Payer: Medicare HMO | Admitting: Family Medicine

## 2020-07-15 ENCOUNTER — Other Ambulatory Visit: Payer: Self-pay | Admitting: Family Medicine

## 2020-07-15 DIAGNOSIS — E039 Hypothyroidism, unspecified: Secondary | ICD-10-CM

## 2020-07-15 MED ORDER — SYNTHROID 200 MCG PO TABS: ORAL_TABLET | ORAL | 1 refills | Status: DC

## 2020-07-16 ENCOUNTER — Ambulatory Visit: Payer: Medicare HMO | Admitting: Family Medicine

## 2020-07-21 ENCOUNTER — Telehealth: Payer: Self-pay | Admitting: Family Medicine

## 2020-07-21 ENCOUNTER — Encounter: Payer: Self-pay | Admitting: Family Medicine

## 2020-07-21 ENCOUNTER — Ambulatory Visit (INDEPENDENT_AMBULATORY_CARE_PROVIDER_SITE_OTHER): Payer: Medicare HMO

## 2020-07-21 ENCOUNTER — Ambulatory Visit: Payer: Self-pay

## 2020-07-21 ENCOUNTER — Ambulatory Visit (INDEPENDENT_AMBULATORY_CARE_PROVIDER_SITE_OTHER): Payer: Medicare HMO | Admitting: Family Medicine

## 2020-07-21 ENCOUNTER — Other Ambulatory Visit: Payer: Self-pay

## 2020-07-21 DIAGNOSIS — M1711 Unilateral primary osteoarthritis, right knee: Secondary | ICD-10-CM | POA: Diagnosis not present

## 2020-07-21 DIAGNOSIS — M1712 Unilateral primary osteoarthritis, left knee: Secondary | ICD-10-CM

## 2020-07-21 DIAGNOSIS — M170 Bilateral primary osteoarthritis of knee: Secondary | ICD-10-CM

## 2020-07-21 DIAGNOSIS — E039 Hypothyroidism, unspecified: Secondary | ICD-10-CM | POA: Diagnosis not present

## 2020-07-21 MED ORDER — SYNTHROID 200 MCG PO TABS: ORAL_TABLET | ORAL | 1 refills | Status: DC

## 2020-07-21 NOTE — Telephone Encounter (Signed)
Requesting approval for bilateral gel injections for knee OA.

## 2020-07-21 NOTE — Progress Notes (Signed)
Office Visit Note   Patient: Kathleen Mcfarland           Date of Birth: 1960/05/31           MRN: [id number] Visit Date: 07/21/2020 Requested by: Eunice Blase, MD [address] [place],  [place] [address] PCP: Eunice Blase, MD  Subjective: Chief Complaint  Patient presents with  . Right Knee - Pain    Pain in both knees, right more than left. Some swelling in the right knee. Last had gel injections in November of 2020 -- these did help. Would like cortisone injections today. Ambulating with cane -- has started using the cane more often.  . Left Knee - Pain    HPI: 60yo F presenting to clinic with concerns of 9mo of acute on chronic bilateral knee pain. Patient states she has a history of arthritis, and has had both steroid and gel shots in the past. The gel shots offered significant relief, and she is hoping to get approved to do more of these. She tries to be very active, going to the gym at least 3 times weekly, but hasn't done so since her pain started. Her knees will occasionally lock on her, most commonly when getting up from a low seat. Typically, her right knee hurts worse than her left, and she feels that both are swollen today.               ROS:   All other systems were reviewed and are negative.  Objective: Vital Signs: LMP 05/31/2018 (Exact Date)   Physical Exam:  General:  Alert and oriented, in no acute distress. Pulm:  Breathing unlabored. Psy:  Normal mood, congruent affect. Skin:  Bilateral knees with overlying skin intact. No bruising, no rashes, no erythema.   KNEE EXAM:  General: Normal gait  Moderate patellar crepitus, Negative J-Sign.   Palpation: Endorses tenderness over medial joint lines and patellar facets bilaterally. No tenderness over patellar tendon.   Supine exam: Trace effusions bilaterally, normal patellar mobility.   Ligamentous Exam:  No pain or laxity with anterior/posterior drawer.  No obvious Sag.  No pain, though does have  psuedolaxity with valgus stress across the knee.    Meniscus:  McMurray with pain , but no obvious clicking.     Strength: Hip flexion (L1), Hip Aduction (L2), Knee Extension (L3) are 5/5 Bilaterally Foot Inversion (L4), Dorsiflexion (L5), and Eversion (S1) 5/5 Bilaterally  Sensation: Intact to light touch medial and lateral aspects of lower extremities, and lateral, dorsal, and medial aspects of foot.    Imaging: XR Knee 1-2 Views Left  Result Date: 07/21/2020 X-Rays show moderate-severe medial and patellofemoral DJD.  XR Knee 1-2 Views Right  Result Date: 07/21/2020 X-Rays show moderate-severe medial and patellofemoral DJD.   Assessment & Plan: 60yo F presenting to clinic with acute on chronic bilateral knee pain. Patient requesting injections today, in the hopes this will allow her to regain some of her function.  - Injection therapy performed as described below, which patient tolerated very well. - Given success with viscosupplementation in the past, will submit for insurance approval of this. - Aftercare and return precautions discussed.  - Patient had no further questions or concerns today.      Procedures: Bilateral Knee Cortisone Injection:  Risks and benefits of procedure discussed, Patient opted to proceed. Verbal Consent obtained.  Timeout performed.  Skin prepped in a sterile fashion with betadine before further cleansing with alcohol. Ethyl Chloride was used for topical analgesia.  Right Knee was injected with 3cc 0.25% bupivacaine without epinephrine via the suprapatellar approach using a 25G, 1.5in needle. Syringe was removed from the needle, and 6mg  betamethadone was then injected into the joint.  Procedure was then repeated on the left knee.  Patient tolerated the injection well with no immediate complications. Aftercare instructions were discussed, and patient was given strict return precautions.     PMFS History: Patient Active Problem List   Diagnosis  Date Noted  . Vitamin D deficiency 08/08/2019  . Bilateral lower extremity edema 08/08/2019  . Pre-diabetes 06/04/2017  . Menopausal syndrome (hot flashes) 06/04/2017  . History of colonic polyps 06/04/2017  . Chronic use of opiate for therapeutic purpose 07/03/2016  . Disorder of rotator cuff of both shoulders 05/19/2015  . Obesity (BMI 30-39.9) 05/19/2015  . Severe recurrent major depressive disorder with psychotic features ([place]) 01/03/2014  . Depression 01/02/2014  . Urinary incontinence 07/21/2010  . HYPERTENSION, BENIGN ESSENTIAL 07/31/2006  . Hypothyroidism (acquired) 06/23/2006  . Seasonal allergies 06/23/2006  . Osteoarthrosis involving lower leg 06/23/2006   Past Medical History:  Diagnosis Date  . Allergic rhinitis   . Anxiety   . Back pain   . Cataract   . Complication of anesthesia    hard to wake up  . Depression   . Hypertension   . Hypothyroidism   . Osteoarthritis 2012   bilateral knees  . Osteoarthritis of knee   . Osteoporosis     Family History  Problem Relation Age of Onset  . Coronary artery disease Father 98       heart disease diagnoised  . Hypertension Father   . Heart disease Father   . Colon cancer Father   . Diabetes Father   . Hyperlipidemia Father   . Diabetes Mother   . Hypertension Mother   . Colon cancer Other        great uncle at 48, great aunt dx at 58, GM age 59  . Colon polyps Other        2 sisters dx at age 12 and 68  . Arthritis Sister   . Hypertension Sister   . Diabetes Sister   . Colon polyps Sister   . Colon cancer Maternal Grandmother   . Ovarian cancer Maternal Grandmother   . Dislocations Maternal Grandmother   . Cirrhosis Maternal Grandfather   . Heart disease Paternal Grandmother   . Coronary artery disease Paternal Grandmother   . Cancer Paternal Grandmother   . Cervical cancer Sister   . Other Sister        uterine fibroids  . Hypertension Sister   . Diabetes Sister   . Colon polyps Sister   . Colon  polyps Brother   . Breast cancer Neg Hx   . Stroke Neg Hx     Past Surgical History:  Procedure Laterality Date  . colonscopy     . DILATION AND CURETTAGE OF UTERUS    . ROTATOR CUFF REPAIR    . SHOULDER ARTHROSCOPY  04/13/2011   Procedure: ARTHROSCOPY SHOULDER;  Surgeon: Ninetta Lights, MD;  Location: [place];  Service: Orthopedics;  Laterality: Right;  Right Shoulder Arthroscopy with Acrmioploasty, Arhtroscopic rotator cuff repair and Microfracture humeral head  . SHOULDER ARTHROSCOPY WITH ROTATOR CUFF REPAIR Right 07/04/2012   Procedure: SHOULDER ARTHROSCOPY WITH ROTATOR CUFF REPAIR;  Surgeon: Ninetta Lights, MD;  Location: [place];  Service: Orthopedics;  Laterality: Right;  RIGHT SHOULDER ARTHROSCOPY WITH ARTHROSCOPIC ROTATOR CUFF  REPAIR, LYSIS OF ADHESIONS  . TUBAL LIGATION     Social History   Occupational History  . Occupation: None  Tobacco Use  . Smoking status: Never Smoker  . Smokeless tobacco: Never Used  Substance and Sexual Activity  . Alcohol use: Yes    Comment: glass of wine once a month  . Drug use: No  . Sexual activity: Yes    Birth control/protection: Other-see comments    Comment: BTL

## 2020-07-21 NOTE — Progress Notes (Signed)
I saw and examined the patient with Dr. Elouise Munroe and agree with assessment and plan as outlined.    Flare-up of bilateral knee pain.  Cortisone given today, gel injections requested for future use.

## 2020-07-21 NOTE — Telephone Encounter (Signed)
Noted  

## 2020-07-29 ENCOUNTER — Telehealth: Payer: Self-pay | Admitting: Family Medicine

## 2020-07-29 NOTE — Telephone Encounter (Signed)
Patient called requesting a call back from [place]. Patient also asking for a prescription of anxiety tablets. Please call patient at [phone].

## 2020-07-30 MED ORDER — ALPRAZOLAM 0.25 MG PO TABS: 0.2500 mg | ORAL_TABLET | Freq: Two times a day (BID) | ORAL | 0 refills | Status: DC | PRN

## 2020-07-30 NOTE — Telephone Encounter (Signed)
I called and advised Kathleen Mcfarland that her medicine had been sent to the pharmacy

## 2020-07-30 NOTE — Telephone Encounter (Signed)
The patient's father passed away on July 27, 2022 of this week. Her blood pressure has been up, but she says it is due to her anxiety. She is asking for a refill of her Xanax.

## 2020-07-30 NOTE — Telephone Encounter (Signed)
Rx sent 

## 2020-08-04 ENCOUNTER — Inpatient Hospital Stay: Admission: RE | Admit: 2020-08-04 | Payer: Medicare HMO | Source: Ambulatory Visit

## 2020-08-24 ENCOUNTER — Other Ambulatory Visit (HOSPITAL_COMMUNITY): Payer: Self-pay | Admitting: Otolaryngology

## 2020-08-24 DIAGNOSIS — R599 Enlarged lymph nodes, unspecified: Secondary | ICD-10-CM

## 2020-08-26 ENCOUNTER — Encounter (HOSPITAL_COMMUNITY): Payer: Self-pay | Admitting: Radiology

## 2020-08-26 NOTE — Progress Notes (Signed)
Kathleen Mcfarland. [name] Female, 60 y.o., July 24, 1960  MRN:  [id number] Phone:  [phone] [name])       PCP:  Eunice Blase, MD Coverage:  [place] Hmo  Next Appt With Radiology ([facility] MM 3) 09/24/2020 at 11:10 AM           FW: [place] CORE BIOPSY (LYMPH NODES) Received: Today Arne Cleveland, MD  Jillyn Hidden  Cancel  See below   Thx  DDH        Previous Messages   ----- Message -----  From: Leta Baptist, MD  Sent: 08/26/2020 12:50 PM EDT  To: Arne Cleveland, MD  Subject: RE: [place] CORE BIOPSY ([place])         [place]. Will cancel the FNA. Thanks  ----- Message -----  From: Arne Cleveland, MD  Sent: 08/26/2020 12:44 PM EDT  To: Leta Baptist, MD  Subject: RE: [place] CORE BIOPSY (LYMPH NODES)         No enlarged nodes on those studies, all too small for core biopsy. FNA would only be able to r/o metastatic carcinoma which sounds like is not the active question.    ----- Message -----  From: Leta Baptist, MD  Sent: 08/26/2020  8:40 AM EDT  To: Arne Cleveland, MD  Subject: RE: [place] CORE BIOPSY (LYMPH NODES)         The patient would like to have one of the cervical lymph nodes biopsied. If they are too small for biopsy, we can go ahead and cancel the order. My suspicion for the lymph node is low. The patient was concerned and saw me as a second opinion. Su Raynelle Bring, MD  ----- Message -----  From: Arne Cleveland, MD  Sent: 08/26/2020  8:22 AM EDT  To: Leta Baptist, MD, Jillyn Hidden  Subject: RE: [place] CORE BIOPSY (LYMPH NODES)         Could we get most recent office visit notes?  I see none in Epic and most recent neck imaging from 2021 (CT and [place])  normal  Not sure what the target is for biopsy   Thx  DDH     ----- Message -----  From: Garth Bigness D  Sent: 08/24/2020  8:33 PM EDT  To: [facility] Procedure Requests  Subject: [place] CORE BIOPSY (LYMPH NODES)           Procedure: [place] CORE BIOPSY (LYMPH NODES)    Reason:  Enlarged lymph node   History: [place] in computer   Provider: Leta Baptist   Provider Contact:  [phone]

## 2020-08-30 ENCOUNTER — Telehealth: Payer: Self-pay | Admitting: Family Medicine

## 2020-08-30 DIAGNOSIS — I10 Essential (primary) hypertension: Secondary | ICD-10-CM

## 2020-08-30 MED ORDER — TRIAMTERENE-HCTZ 37.5-25 MG PO TABS: ORAL_TABLET | ORAL | 1 refills | Status: DC

## 2020-08-30 NOTE — Telephone Encounter (Signed)
Sent!

## 2020-08-30 NOTE — Telephone Encounter (Signed)
Patient called. She would like a refill on her BP medication. Took last pill the weekend.

## 2020-08-30 NOTE — Telephone Encounter (Signed)
The patient is out of her Triamterene/Hct. She did not get the refill request sent in to [facility] in time (she still has a refill on the Rx from 1/22). She is requesting #30 to be sent to [facility] on [address].

## 2020-08-31 ENCOUNTER — Telehealth: Payer: Self-pay

## 2020-08-31 NOTE — Telephone Encounter (Signed)
VOB has been submitted for Monovisc, bilateral knee. Pending BV.

## 2020-09-06 ENCOUNTER — Telehealth: Payer: Self-pay

## 2020-09-06 NOTE — Telephone Encounter (Signed)
PA required for Monovisc, bilateral knee. Submitted PA online through [place]. Pending PA [id number]

## 2020-09-09 ENCOUNTER — Telehealth: Payer: Self-pay

## 2020-09-09 NOTE — Telephone Encounter (Addendum)
Approved for Monovisc, bilateral knee. [place] Patient will be responsible for 20% OOP. Co-pay of $20.00 PA Approval# [id number] Valid 09/06/2020- 12/05/2020  Appt.10/06/2020 with Dr. Junius Roads

## 2020-09-21 ENCOUNTER — Telehealth: Payer: Self-pay | Admitting: Family Medicine

## 2020-09-21 NOTE — Telephone Encounter (Signed)
Pt needs a phone call bsck because she is thinking her thyroid is suppose to be checked soon. CB [phone]

## 2020-09-21 NOTE — Telephone Encounter (Signed)
Tried calling the patient: it rang and then there was a recording that the call could not be completed at this time. Will try her again later.

## 2020-09-22 NOTE — Telephone Encounter (Signed)
Tried calling again - the call still could not go through.

## 2020-09-23 ENCOUNTER — Ambulatory Visit
Admission: RE | Admit: 2020-09-23 | Discharge: 2020-09-23 | Disposition: A | Discharge status: Home / Self Care | Payer: Medicare HMO | Source: Ambulatory Visit | Attending: Family Medicine | Admitting: Family Medicine

## 2020-09-23 ENCOUNTER — Other Ambulatory Visit: Payer: Self-pay

## 2020-09-23 DIAGNOSIS — Z1231 Encounter for screening mammogram for malignant neoplasm of breast: Secondary | ICD-10-CM | POA: Diagnosis not present

## 2020-09-23 NOTE — Telephone Encounter (Signed)
I reached the patient: she wants to know when her thyroid (and whatever other labs) should be repeated. She has still not started Cytomel - I did advised the patient that the original plan was to recheck the thyroid panel w/TSH 2 months after starting this. Is that still the plan? The patient is willing to start it - she had forgotten about it, with the recent death in the family. (She has an upcoming appointment on 10/06/20 for a monovisc injection).

## 2020-09-24 ENCOUNTER — Ambulatory Visit: Payer: Medicare HMO

## 2020-09-24 NOTE — Telephone Encounter (Signed)
Yes, about 8 weeks after making the medicine change.

## 2020-09-24 NOTE — Telephone Encounter (Signed)
Called and left a VM advising patient that her ending date for her authorization was incorrect and has been corrected to 12/05/2020.   Advised patient to give me a call with any questions.

## 2020-09-24 NOTE — Telephone Encounter (Signed)
I called and advised the patient about checking the thyroid 8 weeks after starting Cytomel.   The patient will be having dental work done the week she is scheduled for Monovisc injection (10/06/20). It looks like that was the last date that the authorization covers. The patient wishes to cancel this appointment and reschedule to whenever the dental procedures are completed. Could you resubmit this for the patient and let her know?

## 2020-09-28 ENCOUNTER — Other Ambulatory Visit: Payer: Self-pay | Admitting: Family Medicine

## 2020-09-28 ENCOUNTER — Telehealth: Payer: Self-pay | Admitting: Family Medicine

## 2020-09-28 DIAGNOSIS — I10 Essential (primary) hypertension: Secondary | ICD-10-CM

## 2020-09-28 NOTE — Telephone Encounter (Signed)
Mammogram was normal.   

## 2020-10-06 ENCOUNTER — Ambulatory Visit: Payer: Medicare HMO | Admitting: Family Medicine

## 2020-10-18 ENCOUNTER — Telehealth: Payer: Self-pay | Admitting: Physical Medicine and Rehabilitation

## 2020-10-18 ENCOUNTER — Ambulatory Visit: Payer: Medicare HMO | Admitting: Family Medicine

## 2020-10-18 NOTE — Telephone Encounter (Signed)
PT called and wants to see Dr.Newton for her back. She seen Dr.Newton for a lumbar facet in 2016. She wants him to have a look at her back if possible before she thinks about this injections again.   CB [phone]

## 2020-10-22 ENCOUNTER — Ambulatory Visit: Payer: Medicare HMO | Admitting: Family Medicine

## 2020-10-26 ENCOUNTER — Telehealth: Payer: Self-pay | Admitting: Physical Medicine and Rehabilitation

## 2020-10-26 NOTE — Telephone Encounter (Signed)
Patient called advised need to R/S her appointment for lately in the day. Patient said she can not come to an appointment so early in the morning. The number to contact patient is [phone]

## 2020-10-26 NOTE — Telephone Encounter (Signed)
Rescheduled

## 2020-10-27 ENCOUNTER — Ambulatory Visit: Payer: Medicare HMO | Admitting: Physical Medicine and Rehabilitation

## 2020-10-28 ENCOUNTER — Ambulatory Visit: Payer: Medicare HMO | Admitting: Family Medicine

## 2020-11-03 ENCOUNTER — Encounter: Payer: Self-pay | Admitting: Family Medicine

## 2020-11-03 ENCOUNTER — Other Ambulatory Visit: Payer: Self-pay

## 2020-11-03 ENCOUNTER — Ambulatory Visit: Payer: Medicare HMO | Admitting: Family Medicine

## 2020-11-03 DIAGNOSIS — M1712 Unilateral primary osteoarthritis, left knee: Secondary | ICD-10-CM

## 2020-11-03 DIAGNOSIS — M1711 Unilateral primary osteoarthritis, right knee: Secondary | ICD-10-CM

## 2020-11-03 DIAGNOSIS — M170 Bilateral primary osteoarthritis of knee: Secondary | ICD-10-CM | POA: Diagnosis not present

## 2020-11-03 NOTE — Progress Notes (Signed)
Subjective: She is here for planned bilateral Monovisc injections for knee osteoarthritis.  Objective: Trace effusion bilaterally with no warmth or erythema.  Procedure: Bilateral knee injections: After sterile prep with Betadine, injected 3 cc 1% lidocaine without epinephrine and Monovisc from superolateral approach, a flash of clear yellow synovial fluid was obtained prior to each injection.  Plan: She plans to follow-up in the near future for a wellness exam.

## 2020-11-10 ENCOUNTER — Ambulatory Visit: Payer: Medicare HMO | Admitting: Family Medicine

## 2020-11-16 ENCOUNTER — Ambulatory Visit: Payer: Medicare HMO | Admitting: Physical Medicine and Rehabilitation

## 2020-11-26 ENCOUNTER — Ambulatory Visit: Payer: Medicare HMO | Admitting: Family Medicine

## 2020-12-06 ENCOUNTER — Ambulatory Visit (INDEPENDENT_AMBULATORY_CARE_PROVIDER_SITE_OTHER): Payer: Medicare HMO | Admitting: Family Medicine

## 2020-12-06 ENCOUNTER — Encounter: Payer: Self-pay | Admitting: Family Medicine

## 2020-12-06 ENCOUNTER — Other Ambulatory Visit: Payer: Self-pay

## 2020-12-06 VITALS — BP 108/69 | HR 74 | Ht 67.5 in | Wt 245.8 lb

## 2020-12-06 DIAGNOSIS — E669 Obesity, unspecified: Secondary | ICD-10-CM

## 2020-12-06 DIAGNOSIS — I10 Essential (primary) hypertension: Secondary | ICD-10-CM

## 2020-12-06 DIAGNOSIS — E039 Hypothyroidism, unspecified: Secondary | ICD-10-CM

## 2020-12-06 DIAGNOSIS — F333 Major depressive disorder, recurrent, severe with psychotic symptoms: Secondary | ICD-10-CM

## 2020-12-06 DIAGNOSIS — R7303 Prediabetes: Secondary | ICD-10-CM | POA: Diagnosis not present

## 2020-12-06 DIAGNOSIS — Z86010 Personal history of colonic polyps: Secondary | ICD-10-CM | POA: Diagnosis not present

## 2020-12-06 DIAGNOSIS — Z0000 Encounter for general adult medical examination without abnormal findings: Secondary | ICD-10-CM

## 2020-12-06 DIAGNOSIS — E559 Vitamin D deficiency, unspecified: Secondary | ICD-10-CM | POA: Diagnosis not present

## 2020-12-06 MED ORDER — CETIRIZINE HCL 10 MG PO TABS: 10.0000 mg | ORAL_TABLET | Freq: Every day | ORAL | 3 refills | Status: DC

## 2020-12-06 MED ORDER — IBUPROFEN 800 MG PO TABS: 800.0000 mg | ORAL_TABLET | Freq: Three times a day (TID) | ORAL | 3 refills | Status: DC | PRN

## 2020-12-06 NOTE — Progress Notes (Signed)
Office Visit Note   Patient: Kathleen Mcfarland           Date of Birth: Sep 07, 1960           MRN: [id number] Visit Date: 12/06/2020 Requested by: Eunice Blase, MD [address] [place] [address] PCP: Eunice Blase, MD  Subjective: Chief Complaint  Patient presents with   Annual Exam    Taking only 1/2 of the Cytomel -- she said she does not feel well, taking a whole one. Not taking meloxicam, because it does not help. Ibuprofen works better -- she requests new Rx for 800 mg, instead of 600 mg.    HPI: She is here for her annual wellness examination.  From a thyroid standpoint she is taking Synthroid 200 mcg daily and Cytomel 5 mcg daily.  She tried 2 tablets of Cytomel but it made her not feel right.  Overall she feels pretty good on this regimen.  She is continuing to try to lose weight.  She has dropped below 250 pounds.  She goes to the gym on a regular basis.  She is trying to watch her diet.  Blood pressure has been well controlled with Maxide 37.5-25 mg daily.  She takes Lasix as needed.  She is due for colonoscopy next spring for a history of colon polyps.  She is up-to-date on eye exams, dental exams, gynecologic exams.                ROS:   All other systems were reviewed and are negative.  Objective: Vital Signs: BP 108/69 (BP Location: Left Arm, Patient Position: Sitting, Cuff Size: Large)   Pulse 74   Ht 5' 7.5" (1.715 m)   Wt 245 lb 12.8 oz (111.5 kg)   LMP 05/31/2018 (Exact Date)   BMI 37.93 kg/m   Physical Exam:  General:  Alert and oriented, in no acute distress. Pulm:  Breathing unlabored. Psy:  Normal mood, congruent affect. Skin: No suspicious lesions HEENT:  /AT, PERRLA, EOM Full, no nystagmus.  Funduscopic examination within normal limits.  No conjunctival erythema.  Tympanic membranes are pearly gray with normal landmarks.  External ear canals are normal.  Nasal passages are clear.  Oropharynx is clear.  No significant  lymphadenopathy.  No thyromegaly or nodules.  2+ carotid pulses without bruits. CV: Regular rate and rhythm without murmurs, rubs, or gallops.  No peripheral edema.  2+ radial and posterior tibial pulses. Lungs: Clear to auscultation throughout with no wheezing or areas of consolidation. Abd: Bowel sounds are active, no hepatosplenomegaly or masses.  Soft and nontender.  No audible bruits.  No evidence of ascites. Extremities: 1+ upper and lower DTRs.  No nail deformities.  Normal sensation in her feet with no skin lesions.     Imaging: No results found.  Assessment & Plan: Wellness examination -Labs today.  Follow-up yearly.  2.  Hypertension, under good control.  3.  Prediabetes - Recheck labs today.  Continue efforts at exercise and diet change.  4.  Hypothyroidism, clinically euthyroid - Check labs, refills when needed.  5.  Situational anxiety - She has Xanax to use as needed, but rarely ever takes it.  6.  Colon polyps - Colonoscopy next spring.  7.  Vitamin D deficiency - Recheck levels today.  Target level is 50-80.     Procedures: No procedures performed        PMFS History: Patient Active Problem List   Diagnosis Date Noted   Vitamin D  deficiency 08/08/2019   Bilateral lower extremity edema 08/08/2019   Pre-diabetes 06/04/2017   Menopausal syndrome (hot flashes) 06/04/2017   History of colonic polyps 06/04/2017   Chronic use of opiate for therapeutic purpose 07/03/2016   Disorder of rotator cuff of both shoulders 05/19/2015   Obesity (BMI 30-39.9) 05/19/2015   Severe recurrent major depressive disorder with psychotic features ([place]) 01/03/2014   Depression 01/02/2014   Urinary incontinence 07/21/2010   HYPERTENSION, BENIGN ESSENTIAL 07/31/2006   Hypothyroidism (acquired) 06/23/2006   Seasonal allergies 06/23/2006   Osteoarthrosis involving lower leg 06/23/2006   Past Medical History:  Diagnosis Date   Allergic rhinitis    Anxiety    Back pain     Cataract    Complication of anesthesia    hard to wake up   Depression    Hypertension    Hypothyroidism    Osteoarthritis 2012   bilateral knees   Osteoarthritis of knee    Osteoporosis     Family History  Problem Relation Age of Onset   Coronary artery disease Father 64       heart disease diagnoised   Hypertension Father    Heart disease Father    Colon cancer Father    Diabetes Father    Hyperlipidemia Father    Diabetes Mother    Hypertension Mother    Colon cancer Other        great uncle at 53, great aunt dx at 46, GM age 53   Colon polyps Other        2 sisters dx at age 69 and 9   Arthritis Sister    Hypertension Sister    Diabetes Sister    Colon polyps Sister    Colon cancer Maternal Grandmother    Ovarian cancer Maternal Grandmother    Dislocations Maternal Grandmother    Cirrhosis Maternal Grandfather    Heart disease Paternal Grandmother    Coronary artery disease Paternal Grandmother    Cancer Paternal Grandmother    Cervical cancer Sister    Other Sister        uterine fibroids   Hypertension Sister    Diabetes Sister    Colon polyps Sister    Colon polyps Brother    Breast cancer Neg Hx    Stroke Neg Hx     Past Surgical History:  Procedure Laterality Date   colonscopy      DILATION AND CURETTAGE OF UTERUS     ROTATOR CUFF REPAIR     SHOULDER ARTHROSCOPY  04/13/2011   Procedure: ARTHROSCOPY SHOULDER;  Surgeon: Ninetta Lights, MD;  Location: [facility];  Service: Orthopedics;  Laterality: Right;  Right Shoulder Arthroscopy with Acrmioploasty, Arhtroscopic rotator cuff repair and Microfracture humeral head   SHOULDER ARTHROSCOPY WITH ROTATOR CUFF REPAIR Right 07/04/2012   Procedure: SHOULDER ARTHROSCOPY WITH ROTATOR CUFF REPAIR;  Surgeon: Ninetta Lights, MD;  Location: [facility];  Service: Orthopedics;  Laterality: Right;  RIGHT SHOULDER ARTHROSCOPY WITH ARTHROSCOPIC ROTATOR CUFF REPAIR, LYSIS OF ADHESIONS    TUBAL LIGATION     Social History   Occupational History   Occupation: None  Tobacco Use   Smoking status: Never   Smokeless tobacco: Never  Substance and Sexual Activity   Alcohol use: Yes    Comment: glass of wine once a month   Drug use: No   Sexual activity: Yes    Birth control/protection: Other-see comments    Comment: BTL

## 2020-12-07 ENCOUNTER — Telehealth: Payer: Self-pay | Admitting: Family Medicine

## 2020-12-07 LAB — CBC WITH DIFFERENTIAL/PLATELET
Absolute Monocytes: 321 cells/uL (ref 200–950)
Basophils Absolute: 22 cells/uL (ref 0–200)
Basophils Relative: 0.5 %
Eosinophils Absolute: 110 cells/uL (ref 15–500)
Eosinophils Relative: 2.5 %
HCT: 45.1 % — ABNORMAL HIGH (ref 35.0–45.0)
Hemoglobin: 14.8 g/dL (ref 11.7–15.5)
Lymphs Abs: 1654 cells/uL (ref 850–3900)
MCH: 27.9 pg (ref 27.0–33.0)
MCHC: 32.8 g/dL (ref 32.0–36.0)
MCV: 85.1 fL (ref 80.0–100.0)
MPV: 10 fL (ref 7.5–12.5)
Monocytes Relative: 7.3 %
Neutro Abs: 2292 cells/uL (ref 1500–7800)
Neutrophils Relative %: 52.1 %
Platelets: 251 10*3/uL (ref 140–400)
RBC: 5.3 10*6/uL — ABNORMAL HIGH (ref 3.80–5.10)
RDW: 12.6 % (ref 11.0–15.0)
Total Lymphocyte: 37.6 %
WBC: 4.4 10*3/uL (ref 3.8–10.8)

## 2020-12-07 LAB — HEMOGLOBIN A1C
Hgb A1c MFr Bld: 5.7 % of total Hgb — ABNORMAL HIGH (ref <5.7)
Mean Plasma Glucose: 117 mg/dL
eAG (mmol/L): 6.5 mmol/L

## 2020-12-07 LAB — COMPREHENSIVE METABOLIC PANEL
AG Ratio: 1.7 (calc) (ref 1.0–2.5)
ALT: 26 U/L (ref 6–29)
AST: 28 U/L (ref 10–35)
Albumin: 3.8 g/dL (ref 3.6–5.1)
Alkaline phosphatase (APISO): 53 U/L (ref 37–153)
BUN: 11 mg/dL (ref 7–25)
CO2: 27 mmol/L (ref 20–32)
Calcium: 9.5 mg/dL (ref 8.6–10.4)
Chloride: 105 mmol/L (ref 98–110)
Creat: 0.57 mg/dL (ref 0.50–1.03)
Globulin: 2.2 g/dL (calc) (ref 1.9–3.7)
Glucose, Bld: 91 mg/dL (ref 65–99)
Potassium: 4 mmol/L (ref 3.5–5.3)
Sodium: 140 mmol/L (ref 135–146)
Total Bilirubin: 0.6 mg/dL (ref 0.2–1.2)
Total Protein: 6 g/dL — ABNORMAL LOW (ref 6.1–8.1)

## 2020-12-07 LAB — LIPID PANEL
Cholesterol: 166 mg/dL (ref <200)
HDL: 35 mg/dL — ABNORMAL LOW (ref >=50)
LDL Cholesterol (Calc): 107 mg/dL (calc) — ABNORMAL HIGH
Non-HDL Cholesterol (Calc): 131 mg/dL (calc) — ABNORMAL HIGH (ref <130)
Total CHOL/HDL Ratio: 4.7 (calc) (ref <5.0)
Triglycerides: 127 mg/dL (ref <150)

## 2020-12-07 LAB — THYROID PANEL WITH TSH
Free Thyroxine Index: 3.6 (ref 1.4–3.8)
T3 Uptake: 32 % (ref 22–35)
T4, Total: 11.1 ug/dL (ref 5.1–11.9)
TSH: 4.31 mIU/L (ref 0.40–4.50)

## 2020-12-07 LAB — HIGH SENSITIVITY CRP: hs-CRP: 5.2 mg/L — ABNORMAL HIGH

## 2020-12-07 LAB — VITAMIN D 25 HYDROXY (VIT D DEFICIENCY, FRACTURES): Vit D, 25-Hydroxy: 58 ng/mL (ref 30–100)

## 2020-12-07 NOTE — Telephone Encounter (Signed)
Labs are notable for the following:  Vitamin D looks perfect.  Continue with current dosage.  CBC has borderline abnormalities.  No additional testing needed.  Recheck in 6 to 12 months.  Blood sugar and metabolic panel look good.  Total protein is improving and is borderline abnormal.  C-reactive protein, inflammation marker, is elevated at 5.2.  In general it is helpful to exercise regularly and to minimize dietary intake of processed carbohydrates and sweets.  Recheck this in about 6 months.  A1c is in prediabetes range at 5.7.  Lipid panel will improve with diet and exercise changes.  Thyroid TSH is higher than ideal at 4.31.  Depending on symptoms, could consider increasing Synthroid dosage.

## 2021-01-17 ENCOUNTER — Other Ambulatory Visit (HOSPITAL_COMMUNITY)
Admission: RE | Admit: 2021-01-17 | Discharge: 2021-01-17 | Disposition: A | Discharge status: Home / Self Care | Payer: Medicare HMO | Source: Ambulatory Visit | Attending: Obstetrics and Gynecology | Admitting: Obstetrics and Gynecology

## 2021-01-17 ENCOUNTER — Other Ambulatory Visit: Payer: Self-pay

## 2021-01-17 ENCOUNTER — Encounter: Payer: Self-pay | Admitting: Obstetrics and Gynecology

## 2021-01-17 ENCOUNTER — Ambulatory Visit (INDEPENDENT_AMBULATORY_CARE_PROVIDER_SITE_OTHER): Payer: Medicare HMO | Admitting: Obstetrics and Gynecology

## 2021-01-17 VITALS — BP 152/86 | HR 80 | Wt 249.0 lb

## 2021-01-17 DIAGNOSIS — Z01419 Encounter for gynecological examination (general) (routine) without abnormal findings: Secondary | ICD-10-CM | POA: Diagnosis not present

## 2021-01-17 DIAGNOSIS — Z1151 Encounter for screening for human papillomavirus (HPV): Secondary | ICD-10-CM | POA: Insufficient documentation

## 2021-01-17 NOTE — Progress Notes (Signed)
Last Pap-03/2019 normal Mammogram-09/23/2020-normal  Colonoscopy due 2023

## 2021-01-17 NOTE — Progress Notes (Signed)
Subjective:     Kathleen Mcfarland is a 60 y.o. female postmenauposal with BMI 38 who is here for a comprehensive physical exam. The patient reports no problems. She denies any episodes of vaginal bleeding. She denies any pelvic pain or abnormal discharge. She is concerned she may have a yeast infection due to some occasional pruritis. She is sexually active with a new partner. She denies urinary incontinence. Patient is without any other complaints  Past Medical History:  Diagnosis Date   Allergic rhinitis    Anxiety    Back pain    Cataract    Complication of anesthesia    hard to wake up   Depression    Hypertension    Hypothyroidism    Osteoarthritis 2012   bilateral knees   Osteoarthritis of knee    Osteoporosis    Past Surgical History:  Procedure Laterality Date   colonscopy      DILATION AND CURETTAGE OF UTERUS     ROTATOR CUFF REPAIR     SHOULDER ARTHROSCOPY  04/13/2011   Procedure: ARTHROSCOPY SHOULDER;  Surgeon: Ninetta Lights, MD;  Location: [place] [facility];  Service: Orthopedics;  Laterality: Right;  Right Shoulder Arthroscopy with Acrmioploasty, Arhtroscopic rotator cuff repair and Microfracture humeral head   SHOULDER ARTHROSCOPY WITH ROTATOR CUFF REPAIR Right 07/04/2012   Procedure: SHOULDER ARTHROSCOPY WITH ROTATOR CUFF REPAIR;  Surgeon: Ninetta Lights, MD;  Location: [place];  Service: Orthopedics;  Laterality: Right;  RIGHT SHOULDER ARTHROSCOPY WITH ARTHROSCOPIC ROTATOR CUFF REPAIR, LYSIS OF ADHESIONS   TUBAL LIGATION     Family History  Problem Relation Age of Onset   Coronary artery disease Father 61       heart disease diagnoised   Hypertension Father    Heart disease Father    Colon cancer Father    Diabetes Father    Hyperlipidemia Father    Diabetes Mother    Hypertension Mother    Colon cancer Other        great uncle at 54, great aunt dx at 73, GM age 48   Colon polyps Other        2 sisters dx at age 18 and 29    Arthritis Sister    Hypertension Sister    Diabetes Sister    Colon polyps Sister    Colon cancer Maternal Grandmother    Ovarian cancer Maternal Grandmother    Dislocations Maternal Grandmother    Cirrhosis Maternal Grandfather    Heart disease Paternal Grandmother    Coronary artery disease Paternal Grandmother    Cancer Paternal Grandmother    Cervical cancer Sister    Other Sister        uterine fibroids   Hypertension Sister    Diabetes Sister    Colon polyps Sister    Colon polyps Brother    Breast cancer Neg Hx    Stroke Neg Hx    Social History   Socioeconomic History   Marital status: Single    Spouse name: Not on file   Number of children: 2   Years of education: Not on file   Highest education level: Not on file  Occupational History   Occupation: None  Tobacco Use   Smoking status: Never   Smokeless tobacco: Never  Substance and Sexual Activity   Alcohol use: Yes    Comment: glass of wine once a month   Drug use: No   Sexual activity: Yes    Birth  control/protection: Other-see comments    Comment: BTL  Other Topics Concern   Not on file  Social History Narrative   Regular Exercise- yes 3/week   Caffeine- no   Social Determinants of Health   Financial Resource Strain: Not on file  Food Insecurity: Not on file  Transportation Needs: Not on file  Physical Activity: Not on file  Stress: Not on file  Social Connections: Not on file  Intimate Partner Violence: Not on file   Health Maintenance  Topic Date Due   COVID-19 Vaccine (1) Never done   Zoster Vaccines- Shingrix (1 of 2) Never done   TETANUS/TDAP  05/01/2016   INFLUENZA VACCINE  11/29/2020   COLONOSCOPY (Pts 45-80yr Insurance coverage will need to be confirmed)  09/05/2021   PAP SMEAR-Modifier  03/18/2022   MAMMOGRAM  09/24/2022   Hepatitis C Screening  Completed   HIV Screening  Completed   HPV VACCINES  Aged Out       Review of Systems Pertinent items noted in HPI and remainder  of comprehensive ROS otherwise negative.   Objective:  Blood pressure (!) 152/86, pulse 80, weight 249 lb (112.9 kg), last menstrual period 05/31/2018.   GENERAL: Well-developed, well-nourished female in no acute distress.  HEENT: Normocephalic, atraumatic. Sclerae anicteric.  NECK: Supple. Normal thyroid.  LUNGS: Clear to auscultation bilaterally.  HEART: Regular rate and rhythm. BREASTS: Symmetric in size. No palpable masses or lymphadenopathy, skin changes, or nipple drainage. ABDOMEN: Soft, nontender, nondistended. No organomegaly. PELVIC: Normal external female genitalia. Vagina is pink and rugated.  Normal discharge. Normal appearing cervix. Uterus is normal in size. No adnexal mass or tenderness. Chaperone present during the pelvic exam EXTREMITIES: No cyanosis, clubbing, or edema, 2+ distal pulses.     Assessment:    Healthy female exam.      Plan:    Pap smear collected Patient desires STI testing Patient had mammogram in May 2022 Patient up to date on colonoscopy Patient will be contacted with abnormal results See After Visit Summary for Counseling Recommendations

## 2021-01-18 LAB — CERVICOVAGINAL ANCILLARY ONLY
Bacterial Vaginitis (gardnerella): NEGATIVE
Candida Glabrata: POSITIVE — AB
Candida Vaginitis: NEGATIVE
Chlamydia: NEGATIVE
Comment: NEGATIVE
Comment: NEGATIVE
Comment: NEGATIVE
Comment: NEGATIVE
Comment: NEGATIVE
Comment: NORMAL
Neisseria Gonorrhea: NEGATIVE
Trichomonas: NEGATIVE

## 2021-01-19 ENCOUNTER — Other Ambulatory Visit: Payer: Self-pay | Admitting: Obstetrics & Gynecology

## 2021-01-19 ENCOUNTER — Telehealth: Payer: Self-pay

## 2021-01-19 DIAGNOSIS — B3731 Acute candidiasis of vulva and vagina: Secondary | ICD-10-CM

## 2021-01-19 DIAGNOSIS — B373 Candidiasis of vulva and vagina: Secondary | ICD-10-CM

## 2021-01-19 DIAGNOSIS — B379 Candidiasis, unspecified: Secondary | ICD-10-CM

## 2021-01-19 MED ORDER — FLUCONAZOLE 150 MG PO TABS: 150.0000 mg | ORAL_TABLET | Freq: Once | ORAL | 3 refills | Status: AC

## 2021-01-19 MED ORDER — BORIC ACID CRYS: 600.0000 mg | CRYSTALS | Freq: Every day | 2 refills | Status: AC

## 2021-01-19 NOTE — Telephone Encounter (Signed)
Pt called regarding results to her pap smear. States they were abnormal. Advised patient pap smear results were normal, however we were going to add HRHPV since that was not added at her visit. Pt was advised the vaginal swab showed yeast vaginitis and that Dr. Loni Muse had sent in a prescription to [facility] for both oral diflucan and boric acid vaginal suppositories. Advised patient to pick up both prescriptions. Pt was concerned about this being and STI. Advised patient this was not an STI. Just a vaginal infection and was treatable with the medications prescribed. Pt agreeable to treatment plan and verbalized understanding.

## 2021-01-25 LAB — CYTOLOGY - PAP
Comment: NEGATIVE
Diagnosis: NEGATIVE
High risk HPV: NEGATIVE

## 2021-03-03 ENCOUNTER — Other Ambulatory Visit: Payer: Self-pay | Admitting: Registered Nurse

## 2021-03-09 DIAGNOSIS — I10 Essential (primary) hypertension: Secondary | ICD-10-CM | POA: Diagnosis not present

## 2021-03-09 DIAGNOSIS — Z6838 Body mass index (BMI) 38.0-38.9, adult: Secondary | ICD-10-CM | POA: Diagnosis not present

## 2021-03-30 DIAGNOSIS — Z6838 Body mass index (BMI) 38.0-38.9, adult: Secondary | ICD-10-CM | POA: Diagnosis not present

## 2021-03-30 DIAGNOSIS — E559 Vitamin D deficiency, unspecified: Secondary | ICD-10-CM | POA: Diagnosis not present

## 2021-05-10 DIAGNOSIS — E039 Hypothyroidism, unspecified: Secondary | ICD-10-CM | POA: Diagnosis not present

## 2021-05-10 DIAGNOSIS — F32A Depression, unspecified: Secondary | ICD-10-CM | POA: Diagnosis not present

## 2021-05-10 DIAGNOSIS — F419 Anxiety disorder, unspecified: Secondary | ICD-10-CM | POA: Diagnosis not present

## 2021-05-10 DIAGNOSIS — I10 Essential (primary) hypertension: Secondary | ICD-10-CM | POA: Insufficient documentation

## 2021-05-17 DIAGNOSIS — R7303 Prediabetes: Secondary | ICD-10-CM | POA: Diagnosis not present

## 2021-05-17 DIAGNOSIS — M545 Low back pain, unspecified: Secondary | ICD-10-CM | POA: Diagnosis not present

## 2021-05-17 DIAGNOSIS — G8929 Other chronic pain: Secondary | ICD-10-CM | POA: Diagnosis not present

## 2021-05-17 DIAGNOSIS — E039 Hypothyroidism, unspecified: Secondary | ICD-10-CM | POA: Diagnosis not present

## 2021-05-17 DIAGNOSIS — J302 Other seasonal allergic rhinitis: Secondary | ICD-10-CM | POA: Diagnosis not present

## 2021-05-17 DIAGNOSIS — I10 Essential (primary) hypertension: Secondary | ICD-10-CM | POA: Diagnosis not present

## 2021-05-17 DIAGNOSIS — M170 Bilateral primary osteoarthritis of knee: Secondary | ICD-10-CM | POA: Diagnosis not present

## 2021-05-17 DIAGNOSIS — F418 Other specified anxiety disorders: Secondary | ICD-10-CM | POA: Diagnosis not present

## 2021-05-17 DIAGNOSIS — E669 Obesity, unspecified: Secondary | ICD-10-CM | POA: Diagnosis not present

## 2021-05-18 ENCOUNTER — Other Ambulatory Visit: Payer: Self-pay | Admitting: Internal Medicine

## 2021-05-18 DIAGNOSIS — Z1231 Encounter for screening mammogram for malignant neoplasm of breast: Secondary | ICD-10-CM

## 2021-06-08 DIAGNOSIS — E039 Hypothyroidism, unspecified: Secondary | ICD-10-CM | POA: Diagnosis not present

## 2021-06-08 DIAGNOSIS — G4733 Obstructive sleep apnea (adult) (pediatric): Secondary | ICD-10-CM | POA: Diagnosis not present

## 2021-06-08 DIAGNOSIS — G8929 Other chronic pain: Secondary | ICD-10-CM | POA: Diagnosis not present

## 2021-06-08 DIAGNOSIS — I10 Essential (primary) hypertension: Secondary | ICD-10-CM | POA: Diagnosis not present

## 2021-06-08 DIAGNOSIS — R590 Localized enlarged lymph nodes: Secondary | ICD-10-CM | POA: Diagnosis not present

## 2021-06-08 DIAGNOSIS — M170 Bilateral primary osteoarthritis of knee: Secondary | ICD-10-CM | POA: Diagnosis not present

## 2021-06-14 ENCOUNTER — Other Ambulatory Visit: Payer: Self-pay | Admitting: Otolaryngology

## 2021-06-14 DIAGNOSIS — R591 Generalized enlarged lymph nodes: Secondary | ICD-10-CM

## 2021-07-08 ENCOUNTER — Ambulatory Visit
Admission: RE | Admit: 2021-07-08 | Discharge: 2021-07-08 | Disposition: A | Discharge status: Home / Self Care | Payer: Medicare HMO | Source: Ambulatory Visit | Attending: Otolaryngology | Admitting: Otolaryngology

## 2021-07-08 ENCOUNTER — Other Ambulatory Visit: Payer: Self-pay

## 2021-07-08 DIAGNOSIS — R591 Generalized enlarged lymph nodes: Secondary | ICD-10-CM

## 2021-07-08 DIAGNOSIS — R590 Localized enlarged lymph nodes: Secondary | ICD-10-CM | POA: Diagnosis not present

## 2021-07-08 MED ORDER — IOPAMIDOL (ISOVUE-300) INJECTION 61%
75.0000 mL | Freq: Once | INTRAVENOUS | Status: AC | PRN
  Administered 2021-07-08: 75 mL via INTRAVENOUS

## 2021-07-15 ENCOUNTER — Other Ambulatory Visit: Payer: Self-pay

## 2021-07-15 ENCOUNTER — Encounter (HOSPITAL_COMMUNITY): Payer: Self-pay

## 2021-07-15 ENCOUNTER — Emergency Department (HOSPITAL_COMMUNITY): Payer: Medicare HMO

## 2021-07-15 ENCOUNTER — Emergency Department (HOSPITAL_COMMUNITY)
Admission: EM | Admit: 2021-07-15 | Discharge: 2021-07-16 | Disposition: A | Discharge status: Home / Self Care | Payer: Medicare HMO | Attending: Emergency Medicine | Admitting: Emergency Medicine

## 2021-07-15 DIAGNOSIS — S199XXA Unspecified injury of neck, initial encounter: Secondary | ICD-10-CM | POA: Insufficient documentation

## 2021-07-15 DIAGNOSIS — S4992XA Unspecified injury of left shoulder and upper arm, initial encounter: Secondary | ICD-10-CM | POA: Insufficient documentation

## 2021-07-15 DIAGNOSIS — M2578 Osteophyte, vertebrae: Secondary | ICD-10-CM | POA: Diagnosis not present

## 2021-07-15 DIAGNOSIS — I10 Essential (primary) hypertension: Secondary | ICD-10-CM | POA: Diagnosis not present

## 2021-07-15 DIAGNOSIS — M542 Cervicalgia: Secondary | ICD-10-CM

## 2021-07-15 DIAGNOSIS — M25512 Pain in left shoulder: Secondary | ICD-10-CM | POA: Diagnosis not present

## 2021-07-15 DIAGNOSIS — M549 Dorsalgia, unspecified: Secondary | ICD-10-CM | POA: Diagnosis not present

## 2021-07-15 DIAGNOSIS — Y92410 Unspecified street and highway as the place of occurrence of the external cause: Secondary | ICD-10-CM | POA: Insufficient documentation

## 2021-07-15 DIAGNOSIS — R079 Chest pain, unspecified: Secondary | ICD-10-CM | POA: Diagnosis not present

## 2021-07-15 DIAGNOSIS — E039 Hypothyroidism, unspecified: Secondary | ICD-10-CM | POA: Diagnosis not present

## 2021-07-15 DIAGNOSIS — M19012 Primary osteoarthritis, left shoulder: Secondary | ICD-10-CM | POA: Diagnosis not present

## 2021-07-15 LAB — COMPREHENSIVE METABOLIC PANEL
ALT: 31 U/L (ref 0–44)
AST: 28 U/L (ref 15–41)
Albumin: 3.8 g/dL (ref 3.5–5.0)
Alkaline Phosphatase: 53 U/L (ref 38–126)
Anion gap: 9 (ref 5–15)
BUN: 13 mg/dL (ref 6–20)
CO2: 26 mmol/L (ref 22–32)
Calcium: 9.5 mg/dL (ref 8.9–10.3)
Chloride: 102 mmol/L (ref 98–111)
Creatinine, Ser: 0.76 mg/dL (ref 0.44–1.00)
GFR, Estimated: >60 mL/min (ref >60)
Glucose, Bld: 115 mg/dL — ABNORMAL HIGH (ref 70–99)
Potassium: 3.8 mmol/L (ref 3.5–5.1)
Sodium: 137 mmol/L (ref 135–145)
Total Bilirubin: 0.4 mg/dL (ref 0.3–1.2)
Total Protein: 6.5 g/dL (ref 6.5–8.1)

## 2021-07-15 LAB — CBC WITH DIFFERENTIAL/PLATELET
Abs Immature Granulocytes: 0.01 10*3/uL (ref 0.00–0.07)
Basophils Absolute: 0 10*3/uL (ref 0.0–0.1)
Basophils Relative: 1 %
Eosinophils Absolute: 0.1 10*3/uL (ref 0.0–0.5)
Eosinophils Relative: 1 %
HCT: 47.1 % — ABNORMAL HIGH (ref 36.0–46.0)
Hemoglobin: 15.5 g/dL — ABNORMAL HIGH (ref 12.0–15.0)
Immature Granulocytes: 0 %
Lymphocytes Relative: 29 %
Lymphs Abs: 1.8 10*3/uL (ref 0.7–4.0)
MCH: 27.9 pg (ref 26.0–34.0)
MCHC: 32.9 g/dL (ref 30.0–36.0)
MCV: 84.9 fL (ref 80.0–100.0)
Monocytes Absolute: 0.4 10*3/uL (ref 0.1–1.0)
Monocytes Relative: 7 %
Neutro Abs: 3.9 10*3/uL (ref 1.7–7.7)
Neutrophils Relative %: 62 %
Platelets: 254 10*3/uL (ref 150–400)
RBC: 5.55 MIL/uL — ABNORMAL HIGH (ref 3.87–5.11)
RDW: 12.5 % (ref 11.5–15.5)
WBC: 6.3 10*3/uL (ref 4.0–10.5)
nRBC: 0 % (ref 0.0–0.2)
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
## 2021-07-15 NOTE — ED Notes (Signed)
Pt refused vitals 
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
## 2021-07-18 ENCOUNTER — Telehealth: Payer: Self-pay | Admitting: Orthopedic Surgery

## 2021-07-18 NOTE — Telephone Encounter (Signed)
Pt called asking to speak to a supervisor. Sent to [employer]) I asked what it is in regards to. Pt really did not disclose what the reasoning  is other than to say she is a Dr. Marlou Sa pt and need to speak to supervisor. Please call pt at [phone]. ?
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
## 2021-07-21 ENCOUNTER — Ambulatory Visit: Payer: Medicare HMO | Admitting: Emergency Medicine

## 2021-07-28 ENCOUNTER — Encounter: Payer: Self-pay | Admitting: Orthopedic Surgery

## 2021-07-28 ENCOUNTER — Ambulatory Visit: Payer: Medicare HMO | Admitting: Orthopedic Surgery

## 2021-07-28 DIAGNOSIS — M545 Low back pain, unspecified: Secondary | ICD-10-CM | POA: Diagnosis not present
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
## 2021-08-01 ENCOUNTER — Ambulatory Visit: Payer: Medicare HMO | Admitting: Surgical

## 2021-08-01 ENCOUNTER — Encounter: Payer: Self-pay | Admitting: Orthopedic Surgery

## 2021-08-01 ENCOUNTER — Telehealth: Payer: Self-pay

## 2021-08-01 DIAGNOSIS — M1712 Unilateral primary osteoarthritis, left knee: Secondary | ICD-10-CM

## 2021-08-01 DIAGNOSIS — M170 Bilateral primary osteoarthritis of knee: Secondary | ICD-10-CM

## 2021-08-01 DIAGNOSIS — M1711 Unilateral primary osteoarthritis, right knee: Secondary | ICD-10-CM

## 2021-08-01 MED ORDER — METHYLPREDNISOLONE ACETATE 40 MG/ML IJ SUSP
40.0000 mg | INTRAMUSCULAR | Status: AC | PRN
  Administered 2021-08-01: 40 mg via INTRA_ARTICULAR

## 2021-08-01 MED ORDER — LIDOCAINE HCL 1 % IJ SOLN
5.0000 mL | INTRAMUSCULAR | Status: AC | PRN
  Administered 2021-08-01: 5 mL

## 2021-08-01 MED ORDER — BUPIVACAINE HCL 0.25 % IJ SOLN
4.0000 mL | INTRAMUSCULAR | Status: AC | PRN
  Administered 2021-08-01: 4 mL via INTRA_ARTICULAR

## 2021-08-01 NOTE — Telephone Encounter (Signed)
Noted  

## 2021-08-01 NOTE — Progress Notes (Signed)
? ?Office Visit Note ?  ?Patient: Kathleen Mcfarland           ?Date of Birth: 03-23-1961           ?MRN: [id number] ?Visit Date: 08/01/2021 ?Requested by: Eunice Blase, MD ?[place] ?[place],  [place] [address] ?PCP: Eunice Blase, MD ? ?Subjective: ?Chief Complaint  ?Patient presents with  ? Right Knee - Pain  ? Left Knee - Pain  ? ? ?HPI: Kathleen Mcfarland is a 61 y.o. female who presents to the office complaining of bilateral knee pain.  Patient has history of bilateral knee osteoarthritis.  Left knee typically bothers her more than her right knee.  No new injuries.  Left knee swells up.  No history of prior surgery to either knee.  Denies any groin pain today.  No history of diabetes.  She has had previous bilateral Monovisc injections by Dr. Junius Roads on 11/03/2020.  She would like to try knee injections today.Marland Kitchen   ?             ?ROS: All systems reviewed are negative as they relate to the chief complaint within the history of present illness.  Patient denies fevers or chills. ? ?Assessment & Plan: ?Visit Diagnoses:  ?1. Unilateral primary osteoarthritis, left knee   ?2. Unilateral primary osteoarthritis, right knee   ? ? ?Plan: Patient is a 61 year old female who presents complaint of bilateral knee pain.  She has history of bilateral knee osteoarthritis.  Last injections were Monovisc injections by Dr. Junius Roads in July 2022.  She would like to try cortisone injections today.  Tolerated both injections well.  Plan to preapproved her for bilateral knee gel injections and she will follow-up in 3 months for these injections.  Follow-up as needed sooner if she has new complaint. ? ?Follow-Up Instructions: No follow-ups on file.  ? ?Orders:  ?No orders of the defined types were placed in this encounter. ? ?No orders of the defined types were placed in this encounter. ? ? ? ? Procedures: ?Large Joint Inj: bilateral knee on 08/01/2021 5:19 PM ?Indications: diagnostic evaluation, joint swelling and  pain ?Details: 18 G 1.5 in needle, superolateral approach ? ?Arthrogram: No ? ?Medications (Right): 5 mL lidocaine 1 %; 4 mL bupivacaine 0.25 %; 40 mg methylPREDNISolone acetate 40 MG/ML ?Medications (Left): 5 mL lidocaine 1 %; 4 mL bupivacaine 0.25 %; 40 mg methylPREDNISolone acetate 40 MG/ML ?Outcome: tolerated well, no immediate complications ?Procedure, treatment alternatives, risks and benefits explained, specific risks discussed. Consent was given by the patient. Immediately prior to procedure a time out was called to verify the correct patient, procedure, equipment, support staff and site/side marked as required. Patient was prepped and draped in the usual sterile fashion.  ? ? ? ? ?Clinical Data: ?No additional findings. ? ?Objective: ?Vital Signs: LMP 05/31/2018 (Exact Date)  ? ?Physical Exam:  ?Constitutional: Patient appears well-developed ?HEENT:  ?Head: Normocephalic ?Eyes:EOM are normal ?Neck: Normal range of motion ?Cardiovascular: Normal rate ?Pulmonary/chest: Effort normal ?Neurologic: Patient is alert ?Skin: Skin is warm ?Psychiatric: Patient has normal mood and affect ? ?Ortho Exam: Right knee with 5 degrees extension and 100 degrees of knee flexion.  Left knee with 5 degrees extension and 105 degrees of knee flexion.  No effusion in either knee.  Tenderness over the medial joint line primarily in both knees.  No tenderness over the lateral joint line of either knee.  She has no calf tenderness.  Negative Homans' sign.  No pain with  hip range of motion.  Able to perform straight leg raise with excellent quad strength of both knees. ? ?Specialty Comments:  ?No specialty comments available. ? ?Imaging: ?No results found. ? ? ?PMFS History: ?Patient Active Problem List  ? Diagnosis Date Noted  ? Vitamin D deficiency 08/08/2019  ? Bilateral lower extremity edema 08/08/2019  ? Pre-diabetes 06/04/2017  ? Menopausal syndrome (hot flashes) 06/04/2017  ? History of colonic polyps 06/04/2017  ? Chronic use  of opiate for therapeutic purpose 07/03/2016  ? Disorder of rotator cuff of both shoulders 05/19/2015  ? Obesity (BMI 30-39.9) 05/19/2015  ? Severe recurrent major depressive disorder with psychotic features ([place]) 01/03/2014  ? Depression 01/02/2014  ? Urinary incontinence 07/21/2010  ? HYPERTENSION, BENIGN ESSENTIAL 07/31/2006  ? Hypothyroidism (acquired) 06/23/2006  ? Seasonal allergies 06/23/2006  ? Osteoarthrosis involving lower leg 06/23/2006  ? ?Past Medical History:  ?Diagnosis Date  ? Allergic rhinitis   ? Anxiety   ? Back pain   ? Cataract   ? Complication of anesthesia   ? hard to wake up  ? Depression   ? Hypertension   ? Hypothyroidism   ? Osteoarthritis 2012  ? bilateral knees  ? Osteoarthritis of knee   ? Osteoporosis   ?  ?Family History  ?Problem Relation Age of Onset  ? Coronary artery disease Father 19  ?     heart disease diagnoised  ? Hypertension Father   ? Heart disease Father   ? Colon cancer Father   ? Diabetes Father   ? Hyperlipidemia Father   ? Diabetes Mother   ? Hypertension Mother   ? Colon cancer Other   ?     great uncle at 76, great aunt dx at 57, GM age 38  ? Colon polyps Other   ?     2 sisters dx at age 68 and 58  ? Arthritis Sister   ? Hypertension Sister   ? Diabetes Sister   ? Colon polyps Sister   ? Colon cancer Maternal Grandmother   ? Ovarian cancer Maternal Grandmother   ? Dislocations Maternal Grandmother   ? Cirrhosis Maternal Grandfather   ? Heart disease Paternal Grandmother   ? Coronary artery disease Paternal Grandmother   ? Cancer Paternal Grandmother   ? Cervical cancer Sister   ? Other Sister   ?     uterine fibroids  ? Hypertension Sister   ? Diabetes Sister   ? Colon polyps Sister   ? Colon polyps Brother   ? Breast cancer Neg Hx   ? Stroke Neg Hx   ?  ?Past Surgical History:  ?Procedure Laterality Date  ? colonscopy     ? DILATION AND CURETTAGE OF UTERUS    ? ROTATOR CUFF REPAIR    ? SHOULDER ARTHROSCOPY  04/13/2011  ? Procedure: ARTHROSCOPY SHOULDER;  Surgeon:  Ninetta Lights, MD;  Location: [facility];  Service: Orthopedics;  Laterality: Right;  Right Shoulder Arthroscopy with Acrmioploasty, Arhtroscopic rotator cuff repair and Microfracture humeral head  ? SHOULDER ARTHROSCOPY WITH ROTATOR CUFF REPAIR Right 07/04/2012  ? Procedure: SHOULDER ARTHROSCOPY WITH ROTATOR CUFF REPAIR;  Surgeon: Ninetta Lights, MD;  Location: [facility];  Service: Orthopedics;  Laterality: Right;  RIGHT SHOULDER ARTHROSCOPY WITH ARTHROSCOPIC ROTATOR CUFF REPAIR, LYSIS OF ADHESIONS  ? TUBAL LIGATION    ? ?Social History  ? ?Occupational History  ? Occupation: None  ?Tobacco Use  ? Smoking status: Never  ? Smokeless tobacco: Never  ?  Substance and Sexual Activity  ? Alcohol use: Yes  ?  Comment: glass of wine once a month  ? Drug use: No  ? Sexual activity: Yes  ?  Birth control/protection: Other-see comments  ?  Comment: BTL  ? ? ? ? ?  ?

## 2021-08-01 NOTE — Telephone Encounter (Signed)
Auth needed for bilat knee gel injections please.  ?

## 2021-08-11 ENCOUNTER — Ambulatory Visit: Payer: Medicare HMO | Admitting: Rehabilitative and Restorative Service Providers"

## 2021-08-11 ENCOUNTER — Encounter: Payer: Self-pay | Admitting: Rehabilitative and Restorative Service Providers"

## 2021-08-11 DIAGNOSIS — R293 Abnormal posture: Secondary | ICD-10-CM

## 2021-08-11 DIAGNOSIS — M542 Cervicalgia: Secondary | ICD-10-CM

## 2021-08-11 DIAGNOSIS — R262 Difficulty in walking, not elsewhere classified: Secondary | ICD-10-CM | POA: Diagnosis not present

## 2021-08-11 DIAGNOSIS — M5459 Other low back pain: Secondary | ICD-10-CM
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
## 2021-08-18 IMAGING — MG MM DIGITAL SCREENING BILAT W/ TOMO AND CAD
6 of 12 series · 6 of 36 positions shown · non-contrast
Comparison: Previous exam(s).

CLINICAL DATA: Screening.

EXAM:
DIGITAL SCREENING BILATERAL MAMMOGRAM WITH TOMOSYNTHESIS AND CAD
TECHNIQUE: Bilateral screening digital craniocaudal and mediolateral oblique
mammograms were obtained. Bilateral screening digital breast
tomosynthesis was performed. The images were evaluated with
computer-aided detection.

[L MLO synth-2D (1 of 2)]
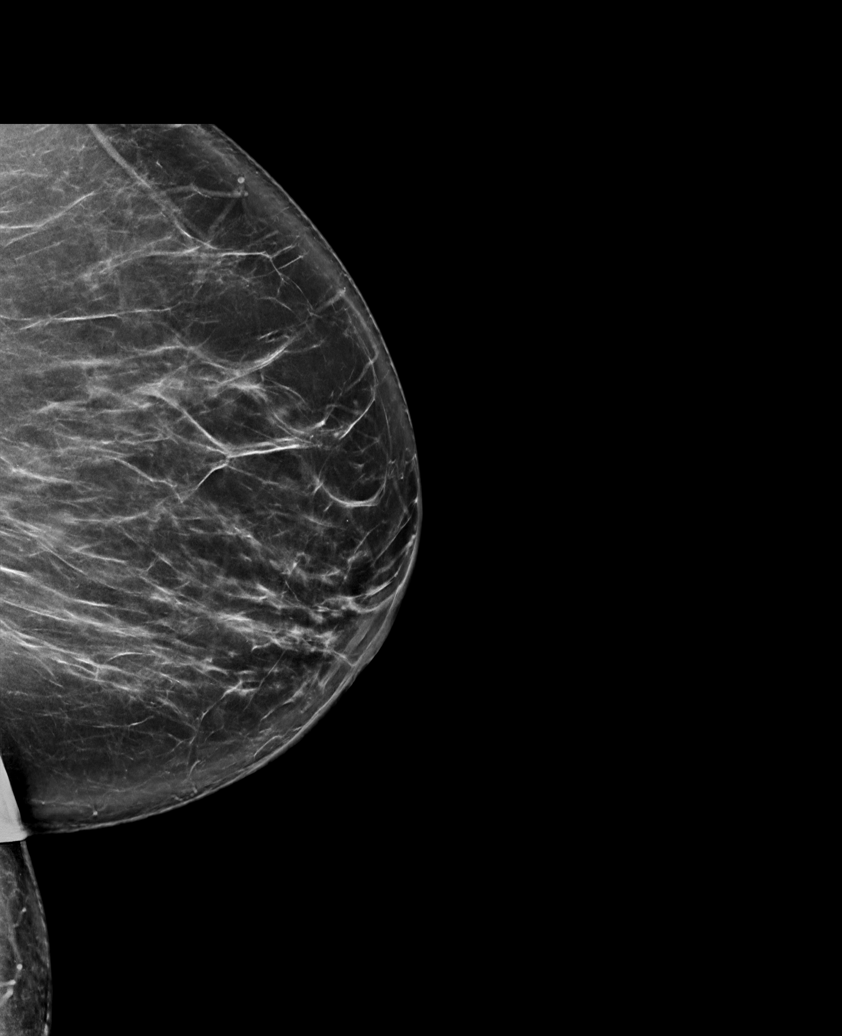

[R MLO synth-2D (1 of 2)]
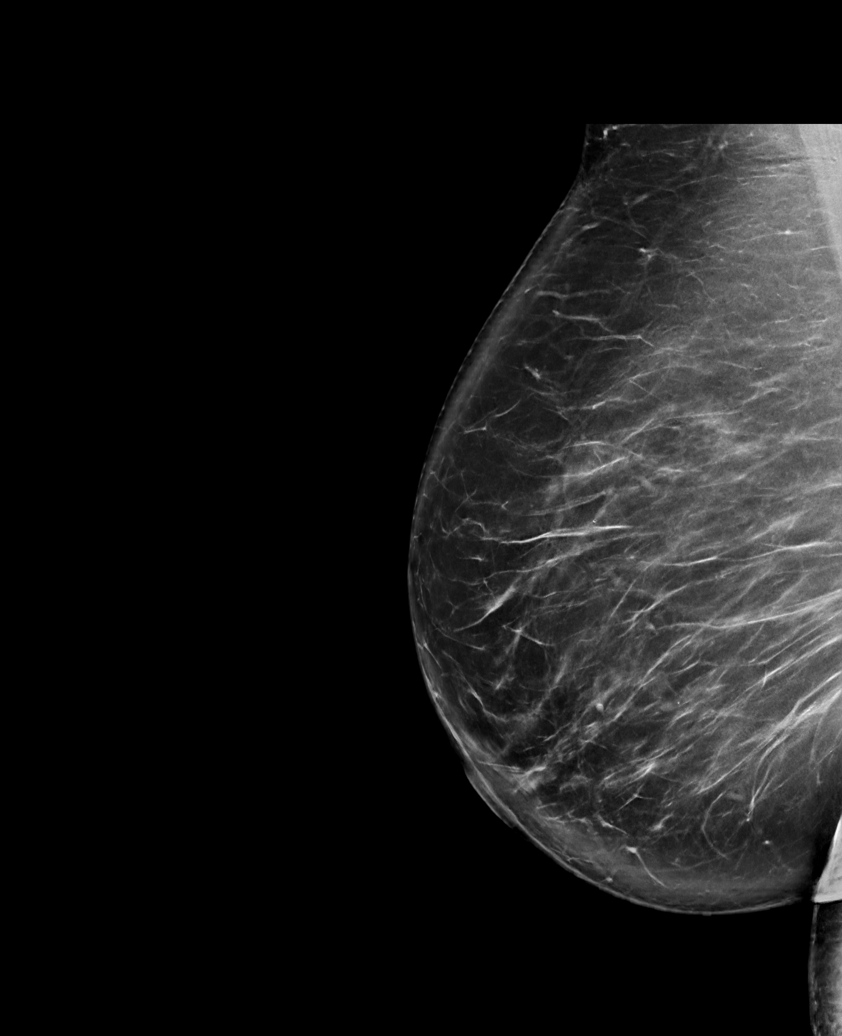

[L CC synth-2D]
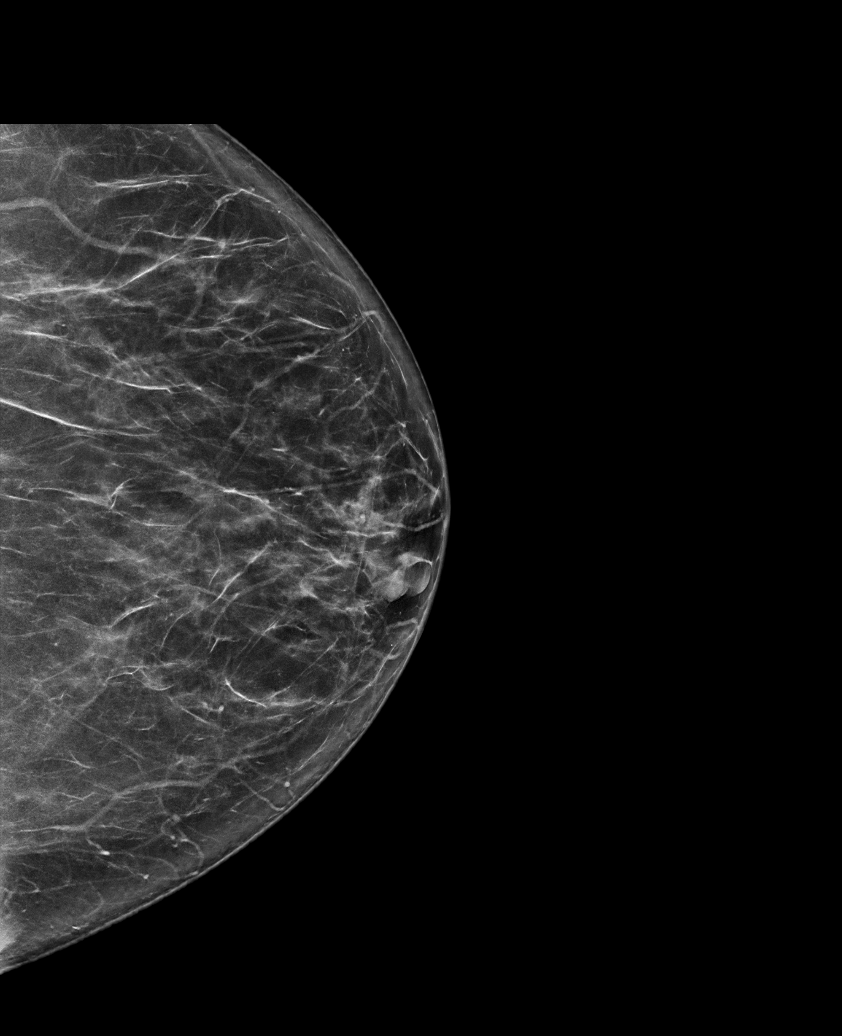

[R MLO synth-2D (2 of 2)]
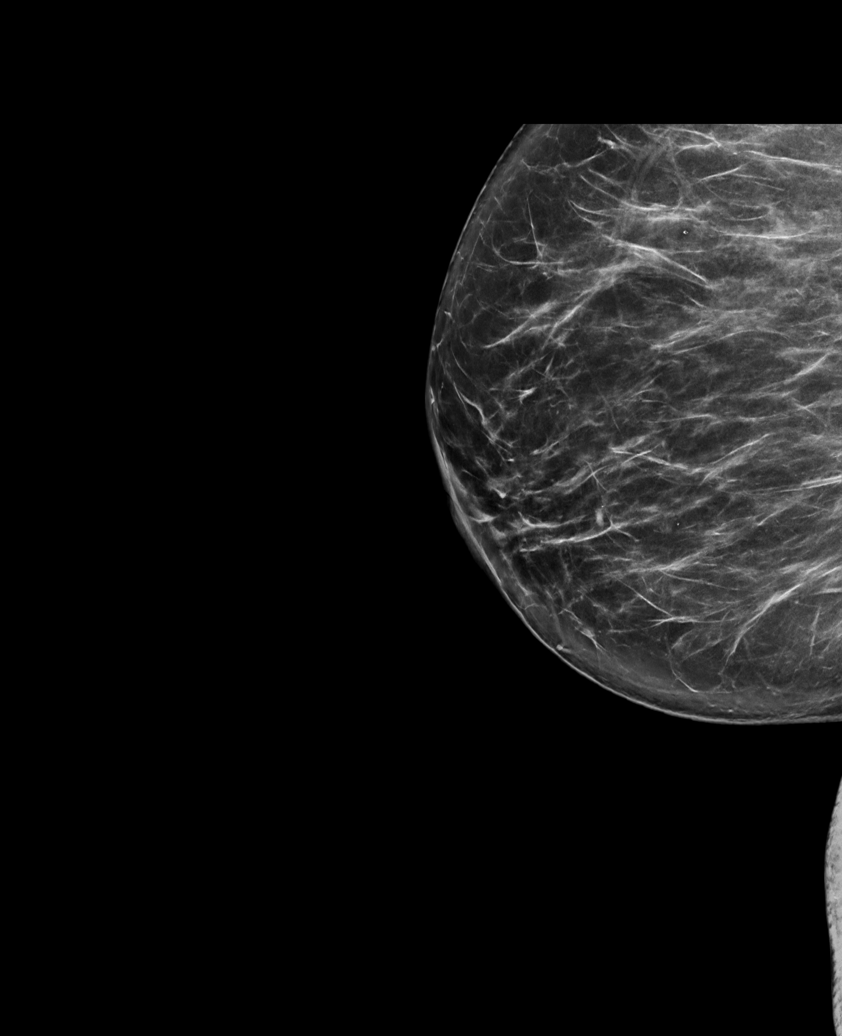

[R CC synth-2D]
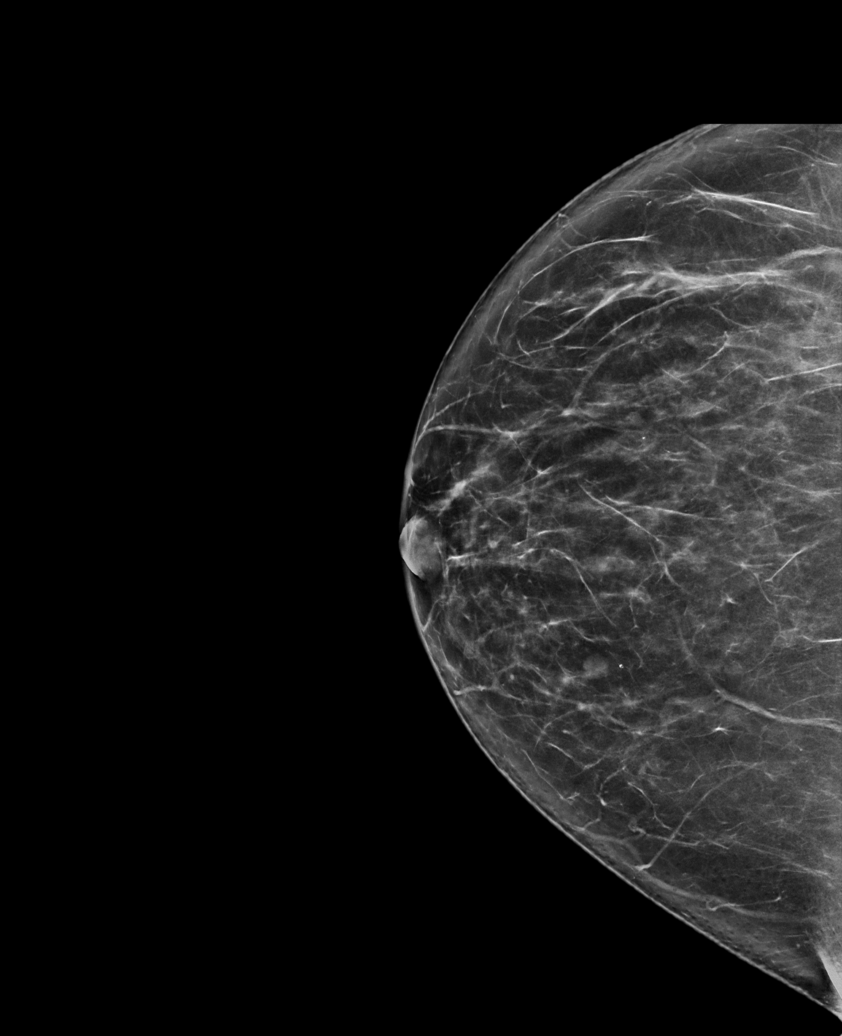

[L MLO synth-2D (2 of 2)]
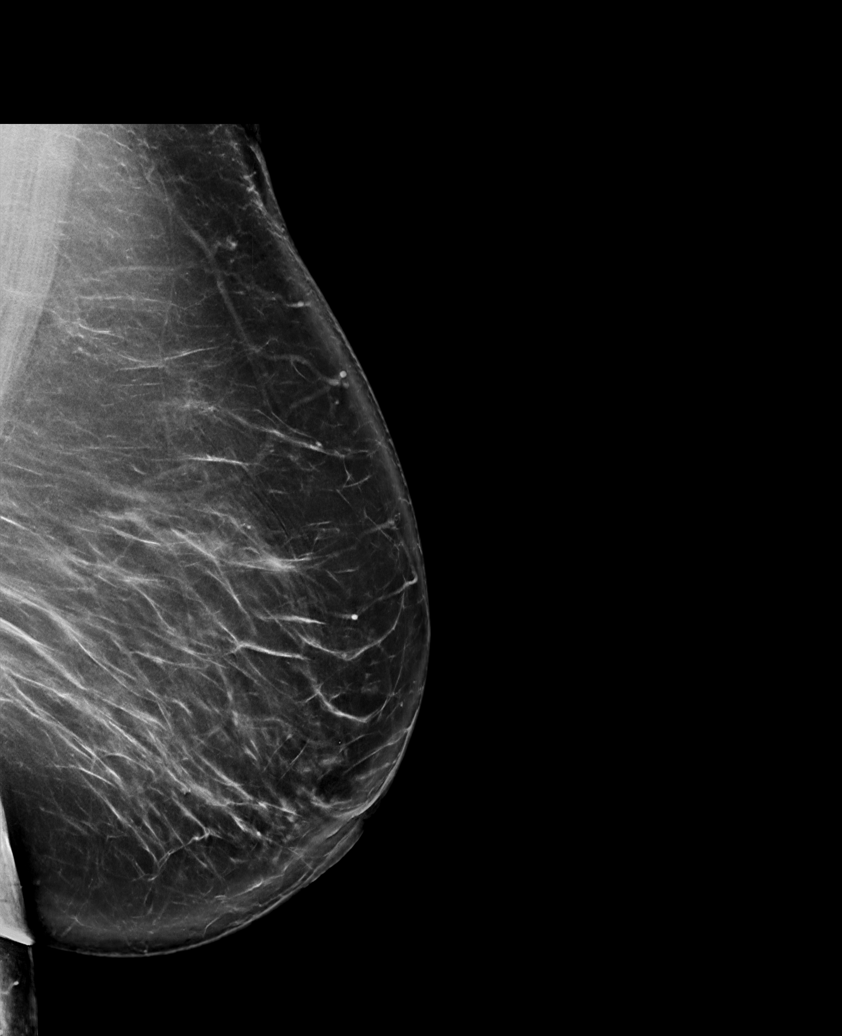

[6 of 36 positions shown; findings below may reference images not displayed]

ACR Breast Density Category b: There are scattered areas of
fibroglandular density.
FINDINGS: There are no findings suspicious for malignancy. The images were
evaluated with computer-aided detection.
IMPRESSION: No mammographic evidence of malignancy. A result letter of this
screening mammogram will be mailed directly to the patient.

RECOMMENDATION:
Screening mammogram in one year. (Code:WJ-I-BG6)

BI-RADS CATEGORY  1: Negative.

## 2021-08-26 ENCOUNTER — Ambulatory Visit: Payer: Medicare HMO | Admitting: Rehabilitative and Restorative Service Providers"

## 2021-08-26 ENCOUNTER — Encounter: Payer: Self-pay | Admitting: Rehabilitative and Restorative Service Providers"

## 2021-08-26 DIAGNOSIS — M5459 Other low back pain: Secondary | ICD-10-CM

## 2021-08-26 DIAGNOSIS — R262 Difficulty in walking, not elsewhere classified: Secondary | ICD-10-CM | POA: Diagnosis not present

## 2021-08-26 DIAGNOSIS — M542 Cervicalgia: Secondary | ICD-10-CM | POA: Diagnosis not present

## 2021-08-26 DIAGNOSIS — R293 Abnormal posture: Secondary | ICD-10-CM | POA: Diagnosis not present

## 2021-08-26 NOTE — Therapy (Signed)
?OUTPATIENT PHYSICAL THERAPY TREATMENT NOTE ? ? ?Patient Name: Kathleen Mcfarland ?MRN: [id number] ?DOB:28-Apr-1961, 61 y.o., female ?Today's Date: 08/26/2021 ? ?PCP: Eunice Blase, MD ?REFERRING PROVIDER: Meredith Pel, MD ? ?END OF SESSION:  ? PT End of Session - 08/26/21 1416   ? ? Visit Number 2   ? Number of Visits 16   ? Date for PT Re-Evaluation 10/06/21   ? Authorization Type [employer]   ? Progress Note Due on Visit 10   ? PT Start Time 1101   ? PT Stop Time 6546   ? PT Time Calculation (min) 48 min   ? Activity Tolerance Patient tolerated treatment well   ? Behavior During Therapy [facility] for tasks assessed/performed   ? ?  ?  ? ?  ? ? ?Past Medical History:  ?Diagnosis Date  ? Allergic rhinitis   ? Anxiety   ? Back pain   ? Cataract   ? Complication of anesthesia   ? hard to wake up  ? Depression   ? Hypertension   ? Hypothyroidism   ? Osteoarthritis 2012  ? bilateral knees  ? Osteoarthritis of knee   ? Osteoporosis   ? ?Past Surgical History:  ?Procedure Laterality Date  ? colonscopy     ? DILATION AND CURETTAGE OF UTERUS    ? ROTATOR CUFF REPAIR    ? SHOULDER ARTHROSCOPY  04/13/2011  ? Procedure: ARTHROSCOPY SHOULDER;  Surgeon: Ninetta Lights, MD;  Location: [place];  Service: Orthopedics;  Laterality: Right;  Right Shoulder Arthroscopy with Acrmioploasty, Arhtroscopic rotator cuff repair and Microfracture humeral head  ? SHOULDER ARTHROSCOPY WITH ROTATOR CUFF REPAIR Right 07/04/2012  ? Procedure: SHOULDER ARTHROSCOPY WITH ROTATOR CUFF REPAIR;  Surgeon: Ninetta Lights, MD;  Location: [place];  Service: Orthopedics;  Laterality: Right;  RIGHT SHOULDER ARTHROSCOPY WITH ARTHROSCOPIC ROTATOR CUFF REPAIR, LYSIS OF ADHESIONS  ? TUBAL LIGATION    ? ?Patient Active Problem List  ? Diagnosis Date Noted  ? Vitamin D deficiency 08/08/2019  ? Bilateral lower extremity edema 08/08/2019  ? Pre-diabetes 06/04/2017  ? Menopausal syndrome (hot flashes) 06/04/2017  ? History of colonic  polyps 06/04/2017  ? Chronic use of opiate for therapeutic purpose 07/03/2016  ? Disorder of rotator cuff of both shoulders 05/19/2015  ? Obesity (BMI 30-39.9) 05/19/2015  ? Severe recurrent major depressive disorder with psychotic features ([place]) 01/03/2014  ? Depression 01/02/2014  ? Urinary incontinence 07/21/2010  ? HYPERTENSION, BENIGN ESSENTIAL 07/31/2006  ? Hypothyroidism (acquired) 06/23/2006  ? Seasonal allergies 06/23/2006  ? Osteoarthrosis involving lower leg 06/23/2006  ? ? ?REFERRING DIAG: M54.50 (ICD-10-CM) - Low back pain, unspecified back pain laterality, unspecified chronicity, unspecified whether sciatica present  ? ?THERAPY DIAG: R29.3   M54.2   M54.59   R26.2 ? ? ?PERTINENT HISTORY: Pre-diabetes, B rotator cuff issues (R RTC repair), obesity, HTN, hypothyroid, OA B knees. ? ?PRECAUTIONS: Back ? ?SUBJECTIVE: Roger reports intermittent early HEP compliance.  Spasm has been limiting her function. ? ?PAIN:  ?Are you having pain? Yes: NPRS scale: 6-8/10 ?Pain location: Neck (L side) and low back ?Pain description: Spasm ?Aggravating factors: Prolonged postures ?Relieving factors: Muscle relaxer (ice encouraged) ? ? ?OBJECTIVE: (objective measures completed at initial evaluation unless otherwise dated) ? ?OBJECTIVE:  ?  ?DIAGNOSTIC FINDINGS:  ?Alignment: Normal. ?  ?Skull base and vertebrae: No acute fracture. ?  ?Soft tissues and spinal canal: No prevertebral fluid or swelling. No ?visible canal hematoma. ?  ?Disc levels: Mild intervertebral  disc space narrowing and osteophyte ?formation is noted, predominantly at C6-C7 and C7-T1. Uncovertebral ?osteophyte formation and disc herniations are noted resulting in ?mild spinal canal stenosis. ?  ?Upper chest: Negative. ?  ?PATIENT SURVEYS:  ?FOTO 35 (Goal 60 in 11 visits) ?  ?SCREENING FOR RED FLAGS: ?Bowel or bladder incontinence: Yes: Decreased frequency ?Spinal tumors: No ?Cauda equina syndrome: No ?Compression fracture: No ?Abdominal aneurysm: No ?   ?COGNITION: ?          Overall cognitive status: Within functional limits for tasks assessed               ?           ?SENSATION: ?WFL ?  ?POSTURE:  ?Forward head, IR and protracted shoulders, shoulders hiked, decreased lumbar lordosis. ?  ?LUMBAR & CERVICAL ROM:  ?  ?Active  A/PROM  ?08/11/2021  ?Cervical Extension 55  ?Lumbar Extension 0  ?Cervical Right lateral flexion 15  ?Left lateral flexion Cervical 10  ?Right rotation Cervical 20  ?Left rotation Cervical 15  ? (Blank rows = not tested) ?  ?  ?Strength (in pounds): ?  ?Hand held-dynamometry in pounds Right ?08/11/2021 Left ?08/11/2021  ?Cervical lateral bending < 3 < 3  ?Cervical extension 5.5    ?Hip abduction      ?Hip adduction      ?Hip internal rotation      ?Hip external rotation      ?Knee flexion      ?Knee extension      ?Ankle dorsiflexion      ?Ankle plantarflexion      ?Ankle inversion      ?Ankle eversion      ? (Blank rows = not tested) ?  ?  ?TODAY'S TREATMENT  ?08/26/2021 ?Therapeutic Exercises: ?Shoulder blade pinches (SBP, avoid hike) 10X 5 seconds ?Lumbar extension AROM 10X 3 seconds (hips forward, needed correction) ?Cervical rotation AROM (SBP 1st) 10X 5 seconds ?Heel to toe raises with paper towel roll to avoid trunk extension/flexion 2 sets of 10 for 3 seconds ?Cervical extension Isometrics 10X 5 seconds ?Cervical lateral bending Isometrics 5X each side for 5 seconds ?Hip hike 5X 2 seconds B (Hold due to poor technique and discomfort/difficulty) ? ?Functional Activities: Education on posture; spasm; pain/spasm/pain cycle; lumbar roll use and information on ice use including info on how to get a cervical ice pack on [place] ? ?Ice neck 10 minutes post-treatment ?  ? ?08/11/2021 ?Therapeutic Exercises: ?Shoulder blade pinches (SBP, avoid hike) 10X 5 seconds ?Lumbar extension AROM 10X 3 seconds (hips forward, needed correction) ?Cervical rotation AROM (SBP 1st) 10X 5 seconds ?  ?Functional Activities: Reviewed imaging, spine anatomy,  pain/spasm/pain cycle, exam findings, basic postural corrections and starter HEP ?  ?PATIENT EDUCATION:  ?Education details: See above ?Person educated: Patient ?Education method: Explanation, Demonstration, Tactile cues, Verbal cues, and Handouts ?Education comprehension: verbalized understanding, returned demonstration, verbal cues required, tactile cues required, and needs further education ?  ?  ?HOME EXERCISE PROGRAM: ?Access Code: [email or web address] ?URL: [email or web address] ?Date: 08/11/2021 ?Prepared by: Vista Mink ?  ?Exercises ?- Standing Scapular Retraction  - 5 x daily - 7 x weekly - 1 sets - 5 reps - 5 second hold ?- Standing Lumbar Extension at [place] 5 x daily - 7 x weekly - 1 sets - 5 reps - 3 seconds hold ?- Seated Cervical Rotation AROM  - 3 x daily - 7 x weekly - 1 sets - 10 reps - 5 seconds  hold ?  ?ASSESSMENT: ?  ?CLINICAL IMPRESSION: ?[place] admits less than perfect early HEP compliance.  She did require correction for her day 1 program but was able to progress with some postural strengthening activities and she was receptive to posture and body mechanics education.  She reported "surprising" relief of pain with ice and will be looking to buy an ice pack to help with pain and spasm while participating more fully in her physical therapy. ?  ?  ?OBJECTIVE IMPAIRMENTS Abnormal gait, decreased activity tolerance, decreased endurance, decreased knowledge of condition, decreased mobility, difficulty walking, decreased ROM, decreased strength, decreased safety awareness, increased muscle spasms, impaired flexibility, impaired UE functional use, improper body mechanics, postural dysfunction, obesity, and pain.  ?  ?ACTIVITY LIMITATIONS cleaning, community activity, and personal finances.  ?  ?PERSONAL FACTORS Pre-diabetes, B rotator cuff issues (R RTC repair), obesity, HTN, hypothyroid, OA B knees are also affecting patient's functional outcome.  ?  ?  ?REHAB POTENTIAL: Good ?   ?CLINICAL DECISION MAKING: Stable/uncomplicated ?  ?EVALUATION COMPLEXITY: Low ?  ?  ?GOALS: ?Goals reviewed with patient? Yes ?  ?SHORT TERM GOALS: [place] date: 09/08/2021 ?  ?Improve lumbar extension AROM to 10 deg

## 2021-09-02 ENCOUNTER — Ambulatory Visit: Payer: Medicare HMO | Admitting: Rehabilitative and Restorative Service Providers"

## 2021-09-02 ENCOUNTER — Encounter: Payer: Self-pay | Admitting: Rehabilitative and Restorative Service Providers"

## 2021-09-02 DIAGNOSIS — R293 Abnormal posture: Secondary | ICD-10-CM | POA: Diagnosis not present

## 2021-09-02 DIAGNOSIS — M542 Cervicalgia: Secondary | ICD-10-CM

## 2021-09-02 DIAGNOSIS — R262 Difficulty in walking, not elsewhere classified: Secondary | ICD-10-CM

## 2021-09-02 DIAGNOSIS — M5459 Other low back pain: Secondary | ICD-10-CM
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
## 2021-09-14 DIAGNOSIS — G4733 Obstructive sleep apnea (adult) (pediatric): Secondary | ICD-10-CM | POA: Diagnosis not present

## 2021-09-14 DIAGNOSIS — E039 Hypothyroidism, unspecified: Secondary | ICD-10-CM | POA: Diagnosis not present

## 2021-09-14 DIAGNOSIS — Z86010 Personal history of colonic polyps: Secondary | ICD-10-CM | POA: Diagnosis not present

## 2021-09-14 DIAGNOSIS — I10 Essential (primary) hypertension: Secondary | ICD-10-CM | POA: Diagnosis not present

## 2021-09-14 DIAGNOSIS — Z0000 Encounter for general adult medical examination without abnormal findings: Secondary | ICD-10-CM | POA: Diagnosis not present

## 2021-09-14 DIAGNOSIS — M170 Bilateral primary osteoarthritis of knee: Secondary | ICD-10-CM | POA: Diagnosis not present

## 2021-09-14 DIAGNOSIS — F418 Other specified anxiety disorders: Secondary | ICD-10-CM | POA: Diagnosis not present

## 2021-09-14 DIAGNOSIS — J302 Other seasonal allergic rhinitis: Secondary | ICD-10-CM | POA: Diagnosis not present

## 2021-09-14 DIAGNOSIS — R7303 Prediabetes: Secondary | ICD-10-CM | POA: Diagnosis not present

## 2021-09-16 ENCOUNTER — Ambulatory Visit: Payer: Medicare HMO | Admitting: Rehabilitative and Restorative Service Providers"

## 2021-09-16 ENCOUNTER — Encounter: Payer: Self-pay | Admitting: Gastroenterology

## 2021-09-16 ENCOUNTER — Encounter: Payer: Self-pay | Admitting: Rehabilitative and Restorative Service Providers"

## 2021-09-16 DIAGNOSIS — R262 Difficulty in walking, not elsewhere classified: Secondary | ICD-10-CM

## 2021-09-16 DIAGNOSIS — R293 Abnormal posture: Secondary | ICD-10-CM | POA: Diagnosis not present

## 2021-09-16 DIAGNOSIS — M5459 Other low back pain: Secondary | ICD-10-CM | POA: Diagnosis not present

## 2021-09-16 DIAGNOSIS — M542 Cervicalgia: Secondary | ICD-10-CM | POA: Diagnosis not present

## 2021-09-16 NOTE — Therapy (Signed)
OUTPATIENT PHYSICAL THERAPY TREATMENT NOTE   Patient Name: Kathleen Mcfarland MRN: [id number] DOB:1961-01-09, 61 y.o., female Today's Date: 09/16/2021  PCP: Eunice Blase, MD REFERRING PROVIDER: Meredith Pel, MD  END OF SESSION:   PT End of Session - 09/16/21 1124     Visit Number 4    Number of Visits 16    Date for PT Re-Evaluation 10/06/21    Authorization Type [employer]    Progress Note Due on Visit 10    PT Start Time 1109    PT Stop Time 1150    PT Time Calculation (min) 41 min    Activity Tolerance Patient limited by pain    Behavior During Therapy [facility] for tasks assessed/performed               Past Medical History:  Diagnosis Date   Allergic rhinitis    Anxiety    Back pain    Cataract    Complication of anesthesia    hard to wake up   Depression    Hypertension    Hypothyroidism    Osteoarthritis 2012   bilateral knees   Osteoarthritis of knee    Osteoporosis    Past Surgical History:  Procedure Laterality Date   colonscopy      DILATION AND CURETTAGE OF UTERUS     ROTATOR CUFF REPAIR     SHOULDER ARTHROSCOPY  04/13/2011   Procedure: ARTHROSCOPY SHOULDER;  Surgeon: Ninetta Lights, MD;  Location: [place];  Service: Orthopedics;  Laterality: Right;  Right Shoulder Arthroscopy with Acrmioploasty, Arhtroscopic rotator cuff repair and Microfracture humeral head   SHOULDER ARTHROSCOPY WITH ROTATOR CUFF REPAIR Right 07/04/2012   Procedure: SHOULDER ARTHROSCOPY WITH ROTATOR CUFF REPAIR;  Surgeon: Ninetta Lights, MD;  Location: [place];  Service: Orthopedics;  Laterality: Right;  RIGHT SHOULDER ARTHROSCOPY WITH ARTHROSCOPIC ROTATOR CUFF REPAIR, LYSIS OF ADHESIONS   TUBAL LIGATION     Patient Active Problem List   Diagnosis Date Noted   Vitamin D deficiency 08/08/2019   Bilateral lower extremity edema 08/08/2019   Pre-diabetes 06/04/2017   Menopausal syndrome (hot flashes) 06/04/2017   History of colonic polyps  06/04/2017   Chronic use of opiate for therapeutic purpose 07/03/2016   Disorder of rotator cuff of both shoulders 05/19/2015   Obesity (BMI 30-39.9) 05/19/2015   Severe recurrent major depressive disorder with psychotic features ([place]) 01/03/2014   Depression 01/02/2014   Urinary incontinence 07/21/2010   HYPERTENSION, BENIGN ESSENTIAL 07/31/2006   Hypothyroidism (acquired) 06/23/2006   Seasonal allergies 06/23/2006   Osteoarthrosis involving lower leg 06/23/2006    REFERRING DIAG: M54.50 (ICD-10-CM) - Low back pain, unspecified back pain laterality, unspecified chronicity, unspecified whether sciatica present   THERAPY DIAG: R29.3   M54.2   M54.59   R26.2   PERTINENT HISTORY: Pre-diabetes, bilateral rotator cuff issues (RT RTC repair), obesity, HTN, hypothyroid, OA bilateral knees.  PRECAUTIONS: no surgical or WB precautions  SUBJECTIVE:  Pt indicated feeling overall about the same level of pain symptoms, perhaps having some times where pain is a little less.  Pt indicated she tried to do movements in HEP to tolerance, stopping when it hurts.  Pt indicated feeling about the same after each visit.   Reported pain complaints c UBE last time.   PAIN:  NPRS scale: 8/10 neck and back 7/10 Pain location: Neck (L side) and low back Pain description: Spasm Aggravating factors: Prolonged postures Relieving factors: Muscle relaxer (ice encouraged)   OBJECTIVE:  DIAGNOSTIC FINDINGS:  08/11/2021 IE report:  Alignment: Normal. Skull base and vertebrae: No acute fracture. Soft tissues and spinal canal: No prevertebral fluid or swelling. No visible canal hematoma. Disc levels: Mild intervertebral disc space narrowing and osteophyte formation is noted, predominantly at C6-C7 and C7-T1. Uncovertebral osteophyte formation and disc herniations are noted resulting in mild spinal canal stenosis.     PATIENT SURVEYS:  08/11/2021 FOTO 50 (Goal 60 in 11 visits)   SCREENING FOR RED  FLAGS: 08/11/2021 Bowel or bladder incontinence: Yes: Decreased frequency Spinal tumors: No Cauda equina syndrome: No Compression fracture: No Abdominal aneurysm: No   COGNITION: 08/11/2021 Overall cognitive status: Within functional limits for tasks assessed                          SENSATION: 08/11/2021 [facility]   POSTURE:  08/11/2021 Forward head, IR and protracted shoulders, shoulders hiked, decreased lumbar lordosis.   LUMBAR & CERVICAL ROM:    Active  AROM  08/11/2021 AROM 09/02/2021 AROM 09/16/2021  Cervical Extension 55 60 30 degrees c pain   Lumbar Extension 0 5   Cervical Right lateral flexion 15 25   Left lateral flexion Cervical 10 10   Right rotation Cervical 20 30 40 c pain  Left rotation Cervical 15 25 30  c pain   (Blank rows = not tested)     Strength (in pounds):   Hand held-dynamometry in pounds Right 08/11/2021 Left 08/11/2021 Lt/Rt 09/02/2021  Cervical lateral bending < 3 < 3 <3/5.2  Cervical extension 5.5   8.1  Hip abduction       Hip adduction       Hip internal rotation       Hip external rotation       Knee flexion       Knee extension       Ankle dorsiflexion       Ankle plantarflexion       Ankle inversion       Ankle eversion        (Blank rows = not tested)     TODAY'S TREATMENT  09/16/2021 Manual:  Percussive device STM Lt upper trap (fair tolerance due to tenderness  Therapeutic Exercises:  Seated green band rows c scapular retraction from HEP, x 20  Seated Lt upper trap stretch 15 sec x 5 (extended time for cues on technique)  Supine cervical retraction isometric into pillow 5 sec hold x 10   Supine cervical rotation to tolerance 2-3 sec hold x 10 each way   Verbal review of existing HEP for knowledge. Held some exercises from last visit due to pain presentation.   Estim  Pre mod Lt upper trap region to tolerance 10 mins   09/02/2021 Therapeutic Exercises: Shoulder blade pinches (SBP, avoid hike) 10X 5 seconds Lumbar extension AROM  10X 3 seconds (hips forward, needed correction) Cervical rotation AROM (SBP 1st) 10X 5 seconds Heel to toe raises with paper towel roll to avoid trunk extension/flexion 2 sets of 10 for 3 seconds Cervical extension Isometrics 10X 5 seconds Cervical lateral bending Isometrics 5X each side for 5 seconds UBE Pull only (:20 Rest/:20 Rest) Level 2 for 5 minutes  Functional Activities: Review body mechanics, posture and update HEP   08/26/2021 Therapeutic Exercises: Shoulder blade pinches (SBP, avoid hike) 10X 5 seconds Lumbar extension AROM 10X 3 seconds (hips forward, needed correction) Cervical rotation AROM (SBP 1st) 10X 5 seconds Heel to toe raises with paper towel roll  to avoid trunk extension/flexion 2 sets of 10 for 3 seconds Cervical extension Isometrics 10X 5 seconds Cervical lateral bending Isometrics 5X each side for 5 seconds Hip hike 5X 2 seconds B (Hold due to poor technique and discomfort/difficulty)  Functional Activities: Education on posture; spasm; pain/spasm/pain cycle; lumbar roll use and information on ice use including info on how to get a cervical ice pack on [employer]  Ice neck 10 minutes post-treatment      PATIENT EDUCATION:  08/11/2021 Education details: See above Person educated: Patient Education method: Explanation, Demonstration, Tactile cues, Verbal cues, and Handouts Education comprehension: verbalized understanding, returned demonstration, verbal cues required, tactile cues required, and needs further education     HOME EXERCISE PROGRAM: Access Code: [email or web address] URL: [email or web address] Date: 09/02/2021 Prepared by: Vista Mink  Exercises - Standing Scapular Retraction  - 5 x daily - 7 x weekly - 1 sets - 5 reps - 5 second hold - Standing Lumbar Extension at [place]  - 5 x daily - 7 x weekly - 1 sets - 5 reps - 3 seconds hold - Seated Cervical Rotation AROM  - 3 x daily - 7 x weekly - 1 sets - 10 reps - 5 seconds hold -  Standing Isometric Cervical Extension with Manual Resistance  - 5 x daily - 7 x weekly - 1 sets - 5 reps - 5 hold - Heel Toe Raises with Counter Support  - 3 x daily - 7 x weekly - 1 sets - 10 reps - 3 seconds hold - Standing Isometric Cervical Sidebending with Manual Resistance  - 3 x daily - 7 x weekly - 1 sets - 5 reps - 5 hold  ASSESSMENT:   CLINICAL IMPRESSION: Pt presented c cervical and lumbar pain complaints c cervical region strongly demonstrating increased muscular tone, trigger points and concordant pain to chief complaints.  Pain and myofascial presentation negatively impacts functional movement and daily activity.  Limited progress noted in long term to this point.  Discussion today over myofascial trigger point release techniques (Dry needling, active compression, percussive STM) with educational handout provided about dry needling.  Hesitation from Pt about use of advanced techniques such as needling so not performed today.      OBJECTIVE IMPAIRMENTS Abnormal gait, decreased activity tolerance, decreased endurance, decreased knowledge of condition, decreased mobility, difficulty walking, decreased ROM, decreased strength, decreased safety awareness, increased muscle spasms, impaired flexibility, impaired UE functional use, improper body mechanics, postural dysfunction, obesity, and pain.    ACTIVITY LIMITATIONS cleaning, community activity, and personal finances.    PERSONAL FACTORS Pre-diabetes, B rotator cuff issues (R RTC repair), obesity, HTN, hypothyroid, OA B knees are also affecting patient's functional outcome.      REHAB POTENTIAL: Good   CLINICAL DECISION MAKING: Stable/uncomplicated   EVALUATION COMPLEXITY: Low     GOALS: Goals reviewed with patient? Yes   SHORT TERM GOALS: Target date: 09/08/2021   Improve lumbar extension AROM to 10 degrees. Baseline: 0 degrees Goal status: Not met    2.  Improve cervical strength for extension to at least 15 pounds and  lateral bending to at least 8 pounds. Baseline: < 6 and < 3, respectively Goal status: Not met    3.  Kathleen Mcfarland will demonstrate independence with her starter HEP. Baseline: Started 08/11/2021 Goal status: Met 09/02/2021   LONG TERM GOALS: Target date: 10/06/2021   Improve FOTO to 60 in 11 visits. Baseline: 35 Goal status: on going - assessed 09/16/2021   2.  Kathleen Mcfarland will report neck, back and lateral torso pain consistently 0-3/10 on the VAS. Baseline: 4-9/10 Goal status: on going - assessed 09/16/2021   3.  Improve cervical AROM for extension to 60 degrees; lateral bending to 20 degrees B and rotation to 50 degrees B. Baseline: 55; 10/15; 15/20 degrees respectively (L/R) Goal status: on going - assessed 09/16/2021   4.  Improve cervical and spine strength as assessed by FOTO and objective measures. Baseline: See FOTO and objective strength Goal status: on going - assessed 09/16/2021   5.  Kathleen Mcfarland will be independent with her long-term HEP at DC. Baseline: Started 08/11/2021 Goal status: on going - assessed 09/16/2021     PLAN: PT FREQUENCY: 1-2x/week   PT DURATION: 8 weeks   PLANNED INTERVENTIONS: Therapeutic exercises, Therapeutic activity, Gait training, Patient/Family education, Joint mobilization, Dry Needling, Electrical stimulation, Cryotherapy, Moist heat, Traction, and Manual therapy.   PLAN FOR NEXT SESSION:   Continue to improve mobility and reduce myofascial trigger point.tension.  Check on desire for DN or additional myofascial release techniques/estim per her preference.      Scot Jun, PT, DPT, OCS, ATC 09/16/21  12:30 PM

## 2021-09-21 ENCOUNTER — Ambulatory Visit: Payer: Medicare HMO | Admitting: Emergency Medicine

## 2021-09-23 ENCOUNTER — Ambulatory Visit: Payer: Medicare HMO | Admitting: Rehabilitative and Restorative Service Providers"

## 2021-09-23 ENCOUNTER — Encounter: Payer: Self-pay | Admitting: Rehabilitative and Restorative Service Providers"

## 2021-09-23 DIAGNOSIS — M5459 Other low back pain: Secondary | ICD-10-CM

## 2021-09-23 DIAGNOSIS — R262 Difficulty in walking, not elsewhere classified: Secondary | ICD-10-CM

## 2021-09-23 DIAGNOSIS — M542 Cervicalgia: Secondary | ICD-10-CM

## 2021-09-23 DIAGNOSIS — R293 Abnormal posture: Secondary | ICD-10-CM | POA: Diagnosis not present

## 2021-09-23 NOTE — Therapy (Signed)
OUTPATIENT PHYSICAL THERAPY TREATMENT NOTE   Patient Name: Kathleen Mcfarland MRN: [id number] DOB:20-Jun-1960, 61 y.o., female Today's Date: 09/23/2021  Progress Note Reporting Period 08/11/2021 to 09/23/2021  See note below for Objective Data and Assessment of Progress/Goals.     PCP: Eunice Blase, MD REFERRING PROVIDER: Meredith Pel, MD  END OF SESSION:   PT End of Session - 09/23/21 1635     Visit Number 5    Number of Visits 16    Date for PT Re-Evaluation 10/06/21    Authorization Type [employer]    Progress Note Due on Visit 10    PT Start Time 1148    PT Stop Time 1230    PT Time Calculation (min) 42 min    Activity Tolerance Patient tolerated treatment well;No increased pain    Behavior During Therapy WFL for tasks assessed/performed                Past Medical History:  Diagnosis Date   Allergic rhinitis    Anxiety    Back pain    Cataract    Complication of anesthesia    hard to wake up   Depression    Hypertension    Hypothyroidism    Osteoarthritis 2012   bilateral knees   Osteoarthritis of knee    Osteoporosis    Past Surgical History:  Procedure Laterality Date   colonscopy      DILATION AND CURETTAGE OF UTERUS     ROTATOR CUFF REPAIR     SHOULDER ARTHROSCOPY  04/13/2011   Procedure: ARTHROSCOPY SHOULDER;  Surgeon: Ninetta Lights, MD;  Location: [place];  Service: Orthopedics;  Laterality: Right;  Right Shoulder Arthroscopy with Acrmioploasty, Arhtroscopic rotator cuff repair and Microfracture humeral head   SHOULDER ARTHROSCOPY WITH ROTATOR CUFF REPAIR Right 07/04/2012   Procedure: SHOULDER ARTHROSCOPY WITH ROTATOR CUFF REPAIR;  Surgeon: Ninetta Lights, MD;  Location: [place];  Service: Orthopedics;  Laterality: Right;  RIGHT SHOULDER ARTHROSCOPY WITH ARTHROSCOPIC ROTATOR CUFF REPAIR, LYSIS OF ADHESIONS   TUBAL LIGATION     Patient Active Problem List   Diagnosis Date Noted   Vitamin D deficiency  08/08/2019   Bilateral lower extremity edema 08/08/2019   Pre-diabetes 06/04/2017   Menopausal syndrome (hot flashes) 06/04/2017   History of colonic polyps 06/04/2017   Chronic use of opiate for therapeutic purpose 07/03/2016   Disorder of rotator cuff of both shoulders 05/19/2015   Obesity (BMI 30-39.9) 05/19/2015   Severe recurrent major depressive disorder with psychotic features ([place]) 01/03/2014   Depression 01/02/2014   Urinary incontinence 07/21/2010   HYPERTENSION, BENIGN ESSENTIAL 07/31/2006   Hypothyroidism (acquired) 06/23/2006   Seasonal allergies 06/23/2006   Osteoarthrosis involving lower leg 06/23/2006    REFERRING DIAG: M54.50 (ICD-10-CM) - Low back pain, unspecified back pain laterality, unspecified chronicity, unspecified whether sciatica present   THERAPY DIAG: R29.3   M54.2   M54.59   R26.2   PERTINENT HISTORY: Pre-diabetes, bilateral rotator cuff issues (RT RTC repair), obesity, HTN, hypothyroid, OA bilateral knees.  PRECAUTIONS: no surgical or WB precautions  SUBJECTIVE: Kathleen Mcfarland notes her neck and shoulder have improved since starting PT.  Her back is improving but not as quickly.  PAIN:  NPRS scale: 6/10 (was 8/10) neck and back 6-7/10 (was 7/10) Pain location: Neck (L side) and low back Pain description: Spasm Aggravating factors: Prolonged postures Relieving factors: Muscle relaxer (ice encouraged)   OBJECTIVE:    DIAGNOSTIC FINDINGS:  08/11/2021 IE report:  Alignment: Normal. Skull base and vertebrae: No acute fracture. Soft tissues and spinal canal: No prevertebral fluid or swelling. No visible canal hematoma. Disc levels: Mild intervertebral disc space narrowing and osteophyte formation is noted, predominantly at C6-C7 and C7-T1. Uncovertebral osteophyte formation and disc herniations are noted resulting in mild spinal canal stenosis.     PATIENT SURVEYS:  09/23/2021 FOTO 76 (was 35, Goal 60) 08/11/2021 FOTO 35 (Goal 60 in 11 visits)   SCREENING  FOR RED FLAGS: 08/11/2021 Bowel or bladder incontinence: Yes: Decreased frequency Spinal tumors: No Cauda equina syndrome: No Compression fracture: No Abdominal aneurysm: No   COGNITION: 08/11/2021 Overall cognitive status: Within functional limits for tasks assessed                          SENSATION: 08/11/2021 [facility]   POSTURE:  08/11/2021 Forward head, IR and protracted shoulders, shoulders hiked, decreased lumbar lordosis.   LUMBAR & CERVICAL ROM:    Active  AROM  08/11/2021 AROM 09/02/2021 AROM 09/16/2021 AROM 09/23/2021  Cervical Extension 55 60 30 degrees c pain  60  Lumbar Extension 0 5  10  Cervical Right lateral flexion _0 Left lateral flexion Cervical _1 Right rotation Cervical 20 30 40 c pain 50  Left rotation Cervical _2 c pain 50   (Blank rows = not tested)     Strength (in pounds):   Hand held-dynamometry in pounds Right 08/11/2021 Left 08/11/2021 Lt/Rt 09/02/2021 09/23/2021 L/R in pounds  Cervical lateral bending < 3 < 3 <3/5.2 7.0/8.8  Cervical extension 5.5   8.1 6.1  Hip abduction        Hip adduction        Hip internal rotation        Hip external rotation        Knee flexion        Knee extension        Ankle dorsiflexion        Ankle plantarflexion        Ankle inversion        Ankle eversion         (Blank rows = not tested)     TODAY'S TREATMENT  09/23/2021 Therapeutic Exercises: Shoulder blade pinches (SBP, avoid hike) 10X 5 seconds Lumbar extension AROM 10X 3 seconds (hips forward, needed correction) Cervical rotation AROM (SBP 1st) 10X 5 seconds Heel to toe raises with paper towel roll to avoid trunk extension/flexion 2 sets of 10 for 3 seconds Cervical extension Isometrics 10X 5 seconds Cervical lateral bending Isometrics 5X each side for 5 seconds   Functional Activities: Review body mechanics, posture and update HEP   09/16/2021 Manual:  Percussive device STM Lt upper trap (fair tolerance due to  tenderness  Therapeutic Exercises:  Seated green band rows c scapular retraction from HEP, x 20  Seated Lt upper trap stretch 15 sec x 5 (extended time for cues on technique)  Supine cervical retraction isometric into pillow 5 sec hold x 10   Supine cervical rotation to tolerance 2-3 sec hold x 10 each way   Verbal review of existing HEP for knowledge. Held some exercises from last visit due to pain presentation.   Estim  Pre mod Lt upper trap region to tolerance 10 mins   09/02/2021 Therapeutic Exercises: Shoulder blade pinches (SBP, avoid hike) 10X 5 seconds Lumbar extension AROM 10X 3 seconds (hips forward, needed correction) Cervical rotation  AROM (SBP 1st) 10X 5 seconds Heel to toe raises with paper towel roll to avoid trunk extension/flexion 2 sets of 10 for 3 seconds Cervical extension Isometrics 10X 5 seconds Cervical lateral bending Isometrics 5X each side for 5 seconds UBE Pull only (:20 Rest/:20 Rest) Level 2 for 5 minutes  Functional Activities: Review body mechanics, posture and update HEP     PATIENT EDUCATION:  09/23/2021 Education details: Review progress note and HEP Person educated: Patient Education method: Explanation, Demonstration, Tactile cues, Verbal cues, and Handouts Education comprehension: verbalized understanding, returned demonstration, verbal cues required, tactile cues required, and needs further education     HOME EXERCISE PROGRAM:  Access Code: [email or web address] URL: [email or web address] Date: 09/23/2021 Prepared by: Vista Mink  Exercises - Standing Scapular Retraction  - 5 x daily - 7 x weekly - 1 sets - 5 reps - 5 second hold - Standing Lumbar Extension at [place]  - 5 x daily - 7 x weekly - 1 sets - 5 reps - 3 seconds hold - Seated Cervical Rotation AROM  - 3 x daily - 7 x weekly - 1 sets - 10 reps - 5 seconds hold - Standing Isometric Cervical Extension with Manual Resistance  - 5 x daily - 7 x weekly - 1 sets - 5 reps  - 5 hold - Heel Toe Raises with Counter Support  - 3 x daily - 7 x weekly - 1 sets - 10 reps - 3 seconds hold - Standing Isometric Cervical Sidebending with Manual Resistance  - 3 x daily - 7 x weekly - 1 sets - 5 reps - 5 hold - Standing Hip Hiking  - 3 x daily - 7 x weekly - 1-2 sets - 5 reps - 3 seconds hold   ASSESSMENT:   CLINICAL IMPRESSION: Kathleen Mcfarland is making steady functional progress towards long-term goals.  FOTO shows significant progress, although significant impairments persist.  Cervical and lumbar AROM have improved while strength and endurance have been slower to progress.  Kathleen Mcfarland's program will have strength progressions next visit to help continue her early progress and meet LTGs.  As with many trauma cases, her progress has been slow but significant and she will benefit from continued supervised PT.     OBJECTIVE IMPAIRMENTS Abnormal gait, decreased activity tolerance, decreased endurance, decreased knowledge of condition, decreased mobility, difficulty walking, decreased ROM, decreased strength, decreased safety awareness, increased muscle spasms, impaired flexibility, impaired UE functional use, improper body mechanics, postural dysfunction, obesity, and pain.    ACTIVITY LIMITATIONS cleaning, community activity, and personal finances.    PERSONAL FACTORS Pre-diabetes, B rotator cuff issues (R RTC repair), obesity, HTN, hypothyroid, OA B knees are also affecting patient's functional outcome.      REHAB POTENTIAL: Good   CLINICAL DECISION MAKING: Stable/uncomplicated   EVALUATION COMPLEXITY: Low     GOALS: Goals reviewed with patient? Yes   SHORT TERM GOALS: Target date: 09/08/2021   Improve lumbar extension AROM to 10 degrees. Baseline: 0 degrees Goal status: Met 09/23/2021   2.  Improve cervical strength for extension to at least 15 pounds and lateral bending to at least 8 pounds. Baseline: < 6 and < 3, respectively Goal status: Met 1 of 3 09/23/2021    3.   Kathleen Mcfarland will demonstrate independence with her starter HEP. Baseline: Started 08/11/2021 Goal status: Met 09/02/2021   LONG TERM GOALS: Target date: 12/09/2021   Improve FOTO to 60 in 11 visits. Baseline: 35 Goal status: On Going - 48 on  09/23/2021   2.  Kathleen Mcfarland will report neck, back and lateral torso pain consistently 0-3/10 on the VAS. Baseline: 4-9/10 Goal status: Improving - assessed 09/23/2021   3.  Improve cervical AROM for extension to 60 degrees; lateral bending to 20 degrees B and rotation to 50 degrees B. Baseline: 55; 10/15; 15/20 degrees respectively (L/R) Goal status: Met 09/23/2021   4.  Improve cervical and spine strength as assessed by FOTO and objective measures. Baseline: See FOTO and objective strength Goal status: On Going 09/23/2021   5.  Kathleen Mcfarland will be independent with her long-term HEP at DC. Baseline: Started 08/11/2021 Goal status: On Going - assessed 09/23/2021     PLAN: PT FREQUENCY: 1-2x/week   PT DURATION: 6 additional weeks (by 12/09/2021)   PLANNED INTERVENTIONS: Therapeutic exercises, Therapeutic activity, Gait training, Patient/Family education, Joint mobilization, Dry Needling, Electrical stimulation, Cryotherapy, Moist heat, Traction, and Manual therapy.   PLAN FOR NEXT SESSION:   Continue cervical, scapular, low back strength progressions.  She will need frequent breaks but should be able to do more.     Farley Ly, PT, MPT 09/23/21  4:48 PM

## 2021-09-28 ENCOUNTER — Ambulatory Visit
Admission: RE | Admit: 2021-09-28 | Discharge: 2021-09-28 | Disposition: A | Discharge status: Home / Self Care | Payer: Medicare HMO | Source: Ambulatory Visit | Attending: Internal Medicine | Admitting: Internal Medicine

## 2021-09-28 DIAGNOSIS — Z1231 Encounter for screening mammogram for malignant neoplasm of breast: Secondary | ICD-10-CM

## 2021-09-30 ENCOUNTER — Encounter: Payer: Medicare HMO | Admitting: Rehabilitative and Restorative Service Providers"

## 2021-09-30 DIAGNOSIS — H25011 Cortical age-related cataract, right eye: Secondary | ICD-10-CM | POA: Diagnosis not present

## 2021-10-04 ENCOUNTER — Encounter: Payer: Self-pay | Admitting: Rehabilitative and Restorative Service Providers"

## 2021-10-04 ENCOUNTER — Ambulatory Visit: Payer: Medicare HMO | Admitting: Rehabilitative and Restorative Service Providers"

## 2021-10-04 DIAGNOSIS — M5459 Other low back pain: Secondary | ICD-10-CM

## 2021-10-04 DIAGNOSIS — M542 Cervicalgia: Secondary | ICD-10-CM

## 2021-10-04 DIAGNOSIS — R293 Abnormal posture: Secondary | ICD-10-CM | POA: Diagnosis not present

## 2021-10-04 DIAGNOSIS — R262 Difficulty in walking, not elsewhere classified: Secondary | ICD-10-CM

## 2021-10-04 NOTE — Therapy (Signed)
OUTPATIENT PHYSICAL THERAPY TREATMENT NOTE   Patient Name: Kathleen Mcfarland MRN: [id number] DOB:08/07/1960, 61 y.o., female Today's Date: 10/04/2021    PCP: Eunice Blase, MD REFERRING PROVIDER: Meredith Pel, MD  END OF SESSION:   PT End of Session - 10/04/21 1301     Visit Number 6    Number of Visits 16    Date for PT Re-Evaluation 10/06/21    Authorization Type [employer]    Progress Note Due on Visit 10    PT Start Time 1151    PT Stop Time 1231    PT Time Calculation (min) 40 min    Activity Tolerance Patient tolerated treatment well;No increased pain    Behavior During Therapy WFL for tasks assessed/performed                 Past Medical History:  Diagnosis Date   Allergic rhinitis    Anxiety    Back pain    Cataract    Complication of anesthesia    hard to wake up   Depression    Hypertension    Hypothyroidism    Osteoarthritis 2012   bilateral knees   Osteoarthritis of knee    Osteoporosis    Past Surgical History:  Procedure Laterality Date   colonscopy      DILATION AND CURETTAGE OF UTERUS     ROTATOR CUFF REPAIR     SHOULDER ARTHROSCOPY  04/13/2011   Procedure: ARTHROSCOPY SHOULDER;  Surgeon: Ninetta Lights, MD;  Location: [place];  Service: Orthopedics;  Laterality: Right;  Right Shoulder Arthroscopy with Acrmioploasty, Arhtroscopic rotator cuff repair and Microfracture humeral head   SHOULDER ARTHROSCOPY WITH ROTATOR CUFF REPAIR Right 07/04/2012   Procedure: SHOULDER ARTHROSCOPY WITH ROTATOR CUFF REPAIR;  Surgeon: Ninetta Lights, MD;  Location: [place];  Service: Orthopedics;  Laterality: Right;  RIGHT SHOULDER ARTHROSCOPY WITH ARTHROSCOPIC ROTATOR CUFF REPAIR, LYSIS OF ADHESIONS   TUBAL LIGATION     Patient Active Problem List   Diagnosis Date Noted   Vitamin D deficiency 08/08/2019   Bilateral lower extremity edema 08/08/2019   Pre-diabetes 06/04/2017   Menopausal syndrome (hot flashes)  06/04/2017   History of colonic polyps 06/04/2017   Chronic use of opiate for therapeutic purpose 07/03/2016   Disorder of rotator cuff of both shoulders 05/19/2015   Obesity (BMI 30-39.9) 05/19/2015   Severe recurrent major depressive disorder with psychotic features ([place]) 01/03/2014   Depression 01/02/2014   Urinary incontinence 07/21/2010   HYPERTENSION, BENIGN ESSENTIAL 07/31/2006   Hypothyroidism (acquired) 06/23/2006   Seasonal allergies 06/23/2006   Osteoarthrosis involving lower leg 06/23/2006    REFERRING DIAG: M54.50 (ICD-10-CM) - Low back pain, unspecified back pain laterality, unspecified chronicity, unspecified whether sciatica present   THERAPY DIAG: R29.3   M54.2   M54.59   R26.2   PERTINENT HISTORY: Pre-diabetes, bilateral rotator cuff issues (RT RTC repair), obesity, HTN, hypothyroid, OA bilateral knees.  PRECAUTIONS: no surgical or WB precautions  SUBJECTIVE: Ryenne notes continued progress with her neck.  Her back is sometimes better and sometimes limiting.  PAIN:  NPRS scale: 2-4/10 (was 8/10) neck and back 4-6/10 (was 7/10) Pain location: Mostly low back Pain description: Spasm Aggravating factors: Prolonged postures, flexion Relieving factors: Muscle relaxer (ice encouraged)   OBJECTIVE:    DIAGNOSTIC FINDINGS:  08/11/2021 IE report:  Alignment: Normal. Skull base and vertebrae: No acute fracture. Soft tissues and spinal canal: No prevertebral fluid or swelling. No visible canal hematoma. Disc  levels: Mild intervertebral disc space narrowing and osteophyte formation is noted, predominantly at C6-C7 and C7-T1. Uncovertebral osteophyte formation and disc herniations are noted resulting in mild spinal canal stenosis.     PATIENT SURVEYS:  09/23/2021 FOTO 29 (was 35, Goal 60) 08/11/2021 FOTO 35 (Goal 60 in 11 visits)   SCREENING FOR RED FLAGS: 08/11/2021 Bowel or bladder incontinence: Yes: Decreased frequency Spinal tumors: No Cauda equina syndrome:  No Compression fracture: No Abdominal aneurysm: No   COGNITION: 08/11/2021 Overall cognitive status: Within functional limits for tasks assessed                          SENSATION: 08/11/2021 [facility]   POSTURE:  08/11/2021 Forward head, IR and protracted shoulders, shoulders hiked, decreased lumbar lordosis.   LUMBAR & CERVICAL ROM:    Active  AROM  08/11/2021 AROM 09/02/2021 AROM 09/16/2021 AROM 09/23/2021  Cervical Extension 55 60 30 degrees c pain  60  Lumbar Extension 0 5  10  Cervical Right lateral flexion _0 Left lateral flexion Cervical _1 Right rotation Cervical 20 30 40 c pain 50  Left rotation Cervical _2 c pain 50   (Blank rows = not tested)     Strength (in pounds):   Hand held-dynamometry in pounds Right 08/11/2021 Left 08/11/2021 Lt/Rt 09/02/2021 09/23/2021 L/R in pounds  Cervical lateral bending < 3 < 3 <3/5.2 7.0/8.8  Cervical extension 5.5   8.1 6.1  Hip abduction        Hip adduction        Hip internal rotation        Hip external rotation        Knee flexion        Knee extension        Ankle dorsiflexion        Ankle plantarflexion        Ankle inversion        Ankle eversion         (Blank rows = not tested)     TODAY'S TREATMENT  10/04/2021 Hip hike in door way 2 sets of 5 for 3 seconds Supine Bridging 10X 3 seconds Shoulder blade pinches (SBP, avoid hike) 10X 5 seconds Lumbar extension AROM 10X 3 seconds (hips forward, needed less correction) Cervical rotation AROM (SBP 1st) 10X 5 seconds Heel to toe raises with paper towel roll to avoid trunk extension/flexion 2 sets of 10 for 3 seconds Cervical extension Isometrics 10X 5 seconds Cervical lateral bending Isometrics 5X each side for 5 seconds Pull to chest Blue 10X (Row)  Functional Activities: correct mechanics for getting dressed (socks and shoes), review log roll and discussion of correct and incorrect body mechanics with gym activities (avoid sit  ups)   09/23/2021 Therapeutic Exercises: Shoulder blade pinches (SBP, avoid hike) 10X 5 seconds Lumbar extension AROM 10X 3 seconds (hips forward, needed correction) Cervical rotation AROM (SBP 1st) 10X 5 seconds Heel to toe raises with paper towel roll to avoid trunk extension/flexion 2 sets of 10 for 3 seconds Cervical extension Isometrics 10X 5 seconds Cervical lateral bending Isometrics 5X each side for 5 seconds   Functional Activities: Review body mechanics, posture and update HEP   09/16/2021 Manual:  Percussive device STM Lt upper trap (fair tolerance due to tenderness  Therapeutic Exercises:  Seated green band rows c scapular retraction from HEP, x 20  Seated Lt upper trap stretch 15 sec  x 5 (extended time for cues on technique)  Supine cervical retraction isometric into pillow 5 sec hold x 10   Supine cervical rotation to tolerance 2-3 sec hold x 10 each way   Verbal review of existing HEP for knowledge. Held some exercises from last visit due to pain presentation.   Estim  Pre mod Lt upper trap region to tolerance 10 mins     PATIENT EDUCATION:  09/23/2021 Education details: Review progress note and HEP Person educated: Patient Education method: Explanation, Demonstration, Tactile cues, Verbal cues, and Handouts Education comprehension: verbalized understanding, returned demonstration, verbal cues required, tactile cues required, and needs further education     HOME EXERCISE PROGRAM: Access Code: [email or web address] URL: [email or web address] Date: 10/04/2021 Prepared by: Vista Mink  Exercises - Standing Scapular Retraction  - 5 x daily - 7 x weekly - 1 sets - 5 reps - 5 second hold - Standing Lumbar Extension at [place]  - 5 x daily - 7 x weekly - 1 sets - 5 reps - 3 seconds hold - Seated Cervical Rotation AROM  - 2 x daily - 7 x weekly - 1 sets - 10 reps - 5 seconds hold - Standing Isometric Cervical Extension with Manual Resistance  - 5 x  daily - 7 x weekly - 1 sets - 5 reps - 5 hold - Heel Toe Raises with Counter Support  - 3 x daily - 1-7 x weekly - 1 sets - 10 reps - 3 seconds hold - Standing Isometric Cervical Sidebending with Manual Resistance  - 3 x daily - 7 x weekly - 1 sets - 5 reps - 5 hold - Standing Hip Hiking  - 3 x daily - 7 x weekly - 1-2 sets - 5 reps - 3 seconds hold - Yoga Bridge  - 2 x daily - 7 x weekly - 1 sets - 10 reps - 5 seconds hold   ASSESSMENT:   CLINICAL IMPRESSION: Zala continues to make steady progress towards long-term goals.  We were able to progress low back strength today and did some good work with body mechanics (getting dressed) and discussed activities to avoid at the gym (sit ups).  Continue appropriate strength progressions with body mechanics updates PRN.     OBJECTIVE IMPAIRMENTS Abnormal gait, decreased activity tolerance, decreased endurance, decreased knowledge of condition, decreased mobility, difficulty walking, decreased ROM, decreased strength, decreased safety awareness, increased muscle spasms, impaired flexibility, impaired UE functional use, improper body mechanics, postural dysfunction, obesity, and pain.    ACTIVITY LIMITATIONS cleaning, community activity, and personal finances.    PERSONAL FACTORS Pre-diabetes, B rotator cuff issues (R RTC repair), obesity, HTN, hypothyroid, OA B knees are also affecting patient's functional outcome.      REHAB POTENTIAL: Good   CLINICAL DECISION MAKING: Stable/uncomplicated   EVALUATION COMPLEXITY: Low     GOALS: Goals reviewed with patient? Yes   SHORT TERM GOALS: Target date: 09/08/2021   Improve lumbar extension AROM to 10 degrees. Baseline: 0 degrees Goal status: Met 09/23/2021   2.  Improve cervical strength for extension to at least 15 pounds and lateral bending to at least 8 pounds. Baseline: < 6 and < 3, respectively Goal status: Met 1 of 3 09/23/2021    3.  Derhonda will demonstrate independence with her starter  HEP. Baseline: Started 08/11/2021 Goal status: Met 09/02/2021   LONG TERM GOALS: Target date: 12/09/2021   Improve FOTO to 60 in 11 visits. Baseline: 35 Goal status: On Going -  48 on 09/23/2021   2.  Lerlene will report neck, back and lateral torso pain consistently 0-3/10 on the VAS. Baseline: 4-9/10 Goal status: Improving - assessed 10/04/2021   3.  Improve cervical AROM for extension to 60 degrees; lateral bending to 20 degrees B and rotation to 50 degrees B. Baseline: 55; 10/15; 15/20 degrees respectively (L/R) Goal status: Met 09/23/2021   4.  Improve cervical and spine strength as assessed by FOTO and objective measures. Baseline: See FOTO and objective strength Goal status: On Going 09/23/2021   5.  Merrell will be independent with her long-term HEP at DC. Baseline: Started 08/11/2021 Goal status: On Going - assessed 56/09/2021     PLAN: PT FREQUENCY: 1-2x/week   PT DURATION: 6 additional weeks (by 12/09/2021)   PLANNED INTERVENTIONS: Therapeutic exercises, Therapeutic activity, Gait training, Patient/Family education, Joint mobilization, Dry Needling, Electrical stimulation, Cryotherapy, Moist heat, Traction, and Manual therapy.   PLAN FOR NEXT SESSION:   Continue cervical, scapular, low back strength progressions.  Review body mechanics and the importance of avoiding flexion.     Farley Ly, PT, MPT 10/04/21  2:39 PM

## 2021-10-13 DIAGNOSIS — H524 Presbyopia: Secondary | ICD-10-CM | POA: Diagnosis not present

## 2021-10-18 ENCOUNTER — Ambulatory Visit (AMBULATORY_SURGERY_CENTER): Payer: Self-pay | Admitting: *Deleted

## 2021-10-18 ENCOUNTER — Encounter: Payer: Self-pay | Admitting: Rehabilitative and Restorative Service Providers"

## 2021-10-18 ENCOUNTER — Ambulatory Visit: Payer: Medicare HMO | Admitting: Rehabilitative and Restorative Service Providers"

## 2021-10-18 VITALS — Ht 67.5 in | Wt 240.0 lb

## 2021-10-18 DIAGNOSIS — Z86010 Personal history of colonic polyps: Secondary | ICD-10-CM

## 2021-10-18 DIAGNOSIS — M542 Cervicalgia: Secondary | ICD-10-CM | POA: Diagnosis not present

## 2021-10-18 DIAGNOSIS — R293 Abnormal posture: Secondary | ICD-10-CM | POA: Diagnosis not present

## 2021-10-18 DIAGNOSIS — Z800 Family history of malignant neoplasm of digestive organs: Secondary | ICD-10-CM

## 2021-10-18 DIAGNOSIS — R262 Difficulty in walking, not elsewhere classified: Secondary | ICD-10-CM | POA: Diagnosis not present

## 2021-10-18 DIAGNOSIS — M5459 Other low back pain: Secondary | ICD-10-CM | POA: Diagnosis not present

## 2021-10-18 MED ORDER — NA SULFATE-K SULFATE-MG SULF 17.5-3.13-1.6 GM/177ML PO SOLN: 1.0000 | ORAL | 0 refills | Status: DC

## 2021-10-18 NOTE — Progress Notes (Signed)
Patient is here in-person for PV. Patient denies any allergies to eggs or soy. Patient denies any problems with anesthesia/sedation. Patient is not on any oxygen at home. Patient is not taking any diet/weight loss medications or blood thinners. Patient is aware of our care-partner policy. Patient notified to use Single[employer] card given to pt for prescription. Pt aware suprep is not covered by insurance-she did not want [employer].   EMMI education assigned to the patient for the procedure, sent to [facility].

## 2021-10-18 NOTE — Therapy (Signed)
OUTPATIENT PHYSICAL THERAPY TREATMENT NOTE   Patient Name: Kathleen Mcfarland MRN: [id number] DOB:[birth year], 61 y.o., female Today's Date: 10/18/2021    PCP: Eunice Blase, MD REFERRING PROVIDER: Meredith Pel, MD  END OF SESSION:   PT End of Session - 10/18/21 1308     Visit Number 7    Number of Visits 16    Date for PT Re-Evaluation 10/06/21    Authorization Type [employer]    Progress Note Due on Visit 10    PT Start Time 2536    PT Stop Time 6440    PT Time Calculation (min) 42 min    Activity Tolerance Patient tolerated treatment well;No increased pain    Behavior During Therapy [facility] for tasks assessed/performed             Past Medical History:  Diagnosis Date   Allergic rhinitis    Anxiety    Back pain    Cataract    Complication of anesthesia    hard to wake up   Depression    Hypertension    Hypothyroidism    Osteoarthritis 2012   bilateral knees   Osteoarthritis of knee    Osteoporosis    Past Surgical History:  Procedure Laterality Date   COLONOSCOPY  09/05/2016   Dr.Jacobs   DILATION AND CURETTAGE OF UTERUS     ROTATOR CUFF REPAIR     SHOULDER ARTHROSCOPY  04/13/2011   Procedure: ARTHROSCOPY SHOULDER;  Surgeon: Ninetta Lights, MD;  Location: [place];  Service: Orthopedics;  Laterality: Right;  Right Shoulder Arthroscopy with Acrmioploasty, Arhtroscopic rotator cuff repair and Microfracture humeral head   SHOULDER ARTHROSCOPY WITH ROTATOR CUFF REPAIR Right 07/04/2012   Procedure: SHOULDER ARTHROSCOPY WITH ROTATOR CUFF REPAIR;  Surgeon: Ninetta Lights, MD;  Location: [place];  Service: Orthopedics;  Laterality: Right;  RIGHT SHOULDER ARTHROSCOPY WITH ARTHROSCOPIC ROTATOR CUFF REPAIR, LYSIS OF ADHESIONS   TUBAL LIGATION     Patient Active Problem List   Diagnosis Date Noted   Vitamin D deficiency 08/08/2019   Bilateral lower extremity edema 08/08/2019   Pre-diabetes 06/04/2017   Menopausal syndrome  (hot flashes) 06/04/2017   History of colonic polyps 06/04/2017   Chronic use of opiate for therapeutic purpose 07/03/2016   Disorder of rotator cuff of both shoulders 05/19/2015   Obesity (BMI 30-39.9) 05/19/2015   Severe recurrent major depressive disorder with psychotic features ([place]) 01/03/2014   Depression 01/02/2014   Urinary incontinence 07/21/2010   HYPERTENSION, BENIGN ESSENTIAL 07/31/2006   Hypothyroidism (acquired) 06/23/2006   Seasonal allergies 06/23/2006   Osteoarthrosis involving lower leg 06/23/2006    REFERRING DIAG: M54.50 (ICD-10-CM) - Low back pain, unspecified back pain laterality, unspecified chronicity, unspecified whether sciatica present   THERAPY DIAG: R29.3   M54.2   M54.59   R26.2   PERTINENT HISTORY: Pre-diabetes, bilateral rotator cuff issues (RT RTC repair), obesity, HTN, hypothyroid, OA bilateral knees.  PRECAUTIONS: no surgical or WB precautions  SUBJECTIVE: Dierdra notes continued steady progress with her neck and back while independent with her HEP over the past 2 weeks.  PAIN:  NPRS scale: 2-4/10 (was 8/10) neck and back 2-4/10 (was 7/10) Pain location: Still mostly low back Pain description: Spasm Aggravating factors: Prolonged postures, flexion Relieving factors: Muscle relaxer, ibuprofen and ice (ice encouraged)   OBJECTIVE:    DIAGNOSTIC FINDINGS:  08/11/2021 IE report:  Alignment: Normal. Skull base and vertebrae: No acute fracture. Soft tissues and spinal canal: No prevertebral fluid or  swelling. No visible canal hematoma. Disc levels: Mild intervertebral disc space narrowing and osteophyte formation is noted, predominantly at C6-C7 and C7-T1. Uncovertebral osteophyte formation and disc herniations are noted resulting in mild spinal canal stenosis.     PATIENT SURVEYS:  10/18/2021 FOTO 56 09/23/2021 FOTO 77 (was 35, Goal 60) 08/11/2021 FOTO 35 (Goal 60 in 11 visits)   SCREENING FOR RED FLAGS: 08/11/2021 Bowel or bladder incontinence:  Yes: Decreased frequency Spinal tumors: No Cauda equina syndrome: No Compression fracture: No Abdominal aneurysm: No   COGNITION: 08/11/2021 Overall cognitive status: Within functional limits for tasks assessed                          SENSATION: 08/11/2021 [facility]   POSTURE:  08/11/2021 Forward head, IR and protracted shoulders, shoulders hiked, decreased lumbar lordosis.   LUMBAR & CERVICAL ROM:    Active  AROM  08/11/2021 AROM 09/02/2021 AROM 09/16/2021 AROM 09/23/2021  Cervical Extension 55 60 30 degrees c pain  60  Lumbar Extension 0 5  10  Cervical Right lateral flexion _0 Left lateral flexion Cervical _1 Right rotation Cervical 20 30 40 c pain 50  Left rotation Cervical _2 c pain 50   (Blank rows = not tested)     Strength (in pounds):   Hand held-dynamometry in pounds Right 08/11/2021 Left 08/11/2021 Lt/Rt 09/02/2021 09/23/2021 L/R in pounds 10/18/2021  Cervical lateral bending < 3 < 3 <3/5.2 7.0/8.8 7.8/16.9  Cervical extension 5.5   8.1 6.1 21.7 pounds  Hip abduction         Hip adduction         Hip internal rotation         Hip external rotation         Knee flexion         Knee extension         Ankle dorsiflexion         Ankle plantarflexion         Ankle inversion         Ankle eversion          (Blank rows = not tested)     TODAY'S TREATMENT  10/18/2021 Hip hike in door way 2 sets of 5 for 3 seconds Supine Bridging 2 sets of 10X 3 seconds Side lie clams 10X 3 seconds B Shoulder blade pinches (SBP, avoid hike) 10X 5 seconds Lumbar extension AROM 10X 3 seconds (hips forward, needed less correction) Cervical rotation AROM (SBP 1st) 10X 5 seconds Cervical extension Isometrics 10X 5 seconds Cervical lateral bending Isometrics 5X each side for 5 seconds Pull to chest Blue 10X (Row)  Functional Activities: Review log roll and discussion of correct and incorrect body mechanics   10/04/2021 Hip hike in door way 2 sets of 5 for 3 seconds Supine  Bridging 10X 3 seconds Shoulder blade pinches (SBP, avoid hike) 10X 5 seconds Lumbar extension AROM 10X 3 seconds (hips forward, needed less correction) Cervical rotation AROM (SBP 1st) 10X 5 seconds Heel to toe raises with paper towel roll to avoid trunk extension/flexion 2 sets of 10 for 3 seconds Cervical extension Isometrics 10X 5 seconds Cervical lateral bending Isometrics 5X each side for 5 seconds Pull to chest Blue 10X (Row)  Functional Activities: correct mechanics for getting dressed (socks and shoes), review log roll and discussion of correct and incorrect body mechanics with gym activities (avoid sit ups)  09/23/2021 Therapeutic Exercises: Shoulder blade pinches (SBP, avoid hike) 10X 5 seconds Lumbar extension AROM 10X 3 seconds (hips forward, needed correction) Cervical rotation AROM (SBP 1st) 10X 5 seconds Heel to toe raises with paper towel roll to avoid trunk extension/flexion 2 sets of 10 for 3 seconds Cervical extension Isometrics 10X 5 seconds Cervical lateral bending Isometrics 5X each side for 5 seconds   Functional Activities: Review body mechanics, posture and update HEP     PATIENT EDUCATION:  09/23/2021 Education details: Review progress note and HEP Person educated: Patient Education method: Explanation, Demonstration, Tactile cues, Verbal cues, and Handouts Education comprehension: verbalized understanding, returned demonstration, verbal cues required, tactile cues required, and needs further education     HOME EXERCISE PROGRAM: Access Code: [email or web address] URL: [email or web address] Date: 10/18/2021 Prepared by: Vista Mink  Exercises - Standing Scapular Retraction  - 5 x daily - 7 x weekly - 1 sets - 5 reps - 5 second hold - Standing Lumbar Extension at [place]  - 5 x daily - 7 x weekly - 1 sets - 5 reps - 3 seconds hold - Seated Cervical Rotation AROM  - 1 x daily - 7 x weekly - 1 sets - 10 reps - 5 seconds hold - Standing  Isometric Cervical Extension with Manual Resistance  - 5 x daily - 7 x weekly - 1 sets - 5 reps - 5 hold - Heel Toe Raises with Counter Support  - 3 x daily - 1-7 x weekly - 1 sets - 10 reps - 3 seconds hold - Standing Isometric Cervical Sidebending with Manual Resistance  - 3 x daily - 7 x weekly - 1 sets - 5 reps - 5 hold - Standing Hip Hiking  - 3 x daily - 7 x weekly - 1-2 sets - 5 reps - 3 seconds hold - Yoga Bridge  - 2 x daily - 7 x weekly - 2 sets - 10 reps - 5 seconds hold - Clamshell  - 2 x daily - 7 x weekly - 2 sets - 10 reps - 3 seconds hold  ASSESSMENT:   CLINICAL IMPRESSION: Meleana continues to make steady progress towards long-term goals.  Her back pain has decreased over the past 2 weeks as has frequency of higher cervical pain.  FOTO was also improved vs her initial and last assessments.  Continue appropriate strength progressions with body mechanics updates PRN.     OBJECTIVE IMPAIRMENTS Abnormal gait, decreased activity tolerance, decreased endurance, decreased knowledge of condition, decreased mobility, difficulty walking, decreased ROM, decreased strength, decreased safety awareness, increased muscle spasms, impaired flexibility, impaired UE functional use, improper body mechanics, postural dysfunction, obesity, and pain.    ACTIVITY LIMITATIONS cleaning, community activity, and personal finances.    PERSONAL FACTORS Pre-diabetes, B rotator cuff issues (R RTC repair), obesity, HTN, hypothyroid, OA B knees are also affecting patient's functional outcome.      REHAB POTENTIAL: Good   CLINICAL DECISION MAKING: Stable/uncomplicated   EVALUATION COMPLEXITY: Low     GOALS: Goals reviewed with patient? Yes   SHORT TERM GOALS: Target date: 09/08/2021   Improve lumbar extension AROM to 10 degrees. Baseline: 0 degrees Goal status: Met 09/23/2021   2.  Improve cervical strength for extension to at least 15 pounds and lateral bending to at least 8 pounds. Baseline: < 6  and < 3, respectively Goal status: Met 2 of 3 10/18/2021    3.  Evalena will demonstrate independence with her starter HEP. Baseline: Started 08/11/2021  Goal status: Met 09/02/2021   LONG TERM GOALS: Target date: 12/09/2021   Improve FOTO to 60 in 11 visits. Baseline: 35 Goal status: On Going - 56 on 10/18/2021   2.  Afsheen will report neck, back and lateral torso pain consistently 0-3/10 on the VAS. Baseline: 4-9/10 Goal status: Improving - assessed 10/18/2021   3.  Improve cervical AROM for extension to 60 degrees; lateral bending to 20 degrees B and rotation to 50 degrees B. Baseline: 55; 10/15; 15/20 degrees respectively (L/R) Goal status: Met 09/23/2021   4.  Improve cervical and spine strength as assessed by FOTO and objective measures. Baseline: See FOTO and objective strength Goal status: On Going 10/18/2021   5.  Suhana will be independent with her long-term HEP at DC. Baseline: Started 08/11/2021 Goal status: On Going - assessed 10/18/2021     PLAN: PT FREQUENCY: 1-2x/week   PT DURATION: 6 additional weeks (by 12/09/2021)   PLANNED INTERVENTIONS: Therapeutic exercises, Therapeutic activity, Gait training, Patient/Family education, Joint mobilization, Dry Needling, Electrical stimulation, Cryotherapy, Moist heat, Traction, and Manual therapy.   PLAN FOR NEXT SESSION:   Continue cervical, scapular, low back strength progressions.  Review body mechanics and the importance of avoiding flexion.     Farley Ly, PT, MPT 10/18/21  4:45 PM

## 2021-10-28 ENCOUNTER — Encounter: Payer: Self-pay | Admitting: Rehabilitative and Restorative Service Providers"

## 2021-10-28 ENCOUNTER — Ambulatory Visit: Payer: Medicare HMO | Admitting: Rehabilitative and Restorative Service Providers"

## 2021-10-28 DIAGNOSIS — M5459 Other low back pain: Secondary | ICD-10-CM | POA: Diagnosis not present

## 2021-10-28 DIAGNOSIS — M542 Cervicalgia: Secondary | ICD-10-CM

## 2021-10-28 DIAGNOSIS — R262 Difficulty in walking, not elsewhere classified: Secondary | ICD-10-CM | POA: Diagnosis not present

## 2021-10-28 DIAGNOSIS — R293 Abnormal posture: Secondary | ICD-10-CM

## 2021-10-28 NOTE — Therapy (Signed)
OUTPATIENT PHYSICAL THERAPY TREATMENT NOTE   Patient Name: Kathleen Mcfarland MRN: [id number] DOB:[birth year], 61 y.o., female Today's Date: 10/28/2021    PCP: Kathleen Blase, MD REFERRING PROVIDER: Meredith Pel, MD  END OF SESSION:   PT End of Session - 10/28/21 1637     Visit Number 8    Number of Visits 16    Date for PT Re-Evaluation 10/06/21    Authorization Type [employer]    Progress Note Due on Visit 10    PT Start Time 1152    PT Stop Time 1231    PT Time Calculation (min) 39 min    Activity Tolerance Patient tolerated treatment well;No increased pain    Behavior During Therapy [facility] for tasks assessed/performed              Past Medical History:  Diagnosis Date   Allergic rhinitis    Anxiety    Back pain    Cataract    Complication of anesthesia    hard to wake up   Depression    Hypertension    Hypothyroidism    Osteoarthritis 2012   bilateral knees   Osteoarthritis of knee    Osteoporosis    Past Surgical History:  Procedure Laterality Date   COLONOSCOPY  09/05/2016   Kathleen Mcfarland   DILATION AND CURETTAGE OF UTERUS     ROTATOR CUFF REPAIR     SHOULDER ARTHROSCOPY  04/13/2011   Procedure: ARTHROSCOPY SHOULDER;  Surgeon: Ninetta Lights, MD;  Location: [place];  Service: Orthopedics;  Laterality: Right;  Right Shoulder Arthroscopy with Acrmioploasty, Arhtroscopic rotator cuff repair and Microfracture humeral head   SHOULDER ARTHROSCOPY WITH ROTATOR CUFF REPAIR Right 07/04/2012   Procedure: SHOULDER ARTHROSCOPY WITH ROTATOR CUFF REPAIR;  Surgeon: Ninetta Lights, MD;  Location: [place];  Service: Orthopedics;  Laterality: Right;  RIGHT SHOULDER ARTHROSCOPY WITH ARTHROSCOPIC ROTATOR CUFF REPAIR, LYSIS OF ADHESIONS   TUBAL LIGATION     Patient Active Problem List   Diagnosis Date Noted   Vitamin D deficiency 08/08/2019   Bilateral lower extremity edema 08/08/2019   Pre-diabetes 06/04/2017   Menopausal syndrome  (hot flashes) 06/04/2017   History of colonic polyps 06/04/2017   Chronic use of opiate for therapeutic purpose 07/03/2016   Disorder of rotator cuff of both shoulders 05/19/2015   Obesity (BMI 30-39.9) 05/19/2015   Severe recurrent major depressive disorder with psychotic features ([place]) 01/03/2014   Depression 01/02/2014   Urinary incontinence 07/21/2010   HYPERTENSION, BENIGN ESSENTIAL 07/31/2006   Hypothyroidism (acquired) 06/23/2006   Seasonal allergies 06/23/2006   Osteoarthrosis involving lower leg 06/23/2006    REFERRING DIAG: M54.50 (ICD-10-CM) - Low back pain, unspecified back pain laterality, unspecified chronicity, unspecified whether sciatica present   THERAPY DIAG: R29.3   M54.2   M54.59   R26.2   PERTINENT HISTORY: Pre-diabetes, bilateral rotator cuff issues (RT RTC repair), obesity, HTN, hypothyroid, OA bilateral knees.  PRECAUTIONS: no surgical or WB precautions  SUBJECTIVE: Kathleen Mcfarland notes her neck is almost back to normal.  She notes some progress with her back over the past 10 days although progress has been slower.  PAIN:  NPRS scale: 1/10 (was 8/10) neck and back 2-4/10 (was 7/10) Pain location: Still mostly low back Pain description: Spasm Aggravating factors: Prolonged postures, flexion Relieving factors: Muscle relaxer (3X this week) PRN, ibuprofen PRN (2X this week) and ice (ice encouraged)   OBJECTIVE:    DIAGNOSTIC FINDINGS:  08/11/2021 IE report:  Alignment: Normal.  Skull base and vertebrae: No acute fracture. Soft tissues and spinal canal: No prevertebral fluid or swelling. No visible canal hematoma. Disc levels: Mild intervertebral disc space narrowing and osteophyte formation is noted, predominantly at C6-C7 and C7-T1. Uncovertebral osteophyte formation and disc herniations are noted resulting in mild spinal canal stenosis.     PATIENT SURVEYS:  10/18/2021 FOTO 56 09/23/2021 FOTO 67 (was 35, Goal 60) 08/11/2021 FOTO 35 (Goal 60 in 11 visits)    SCREENING FOR RED FLAGS: 08/11/2021 Bowel or bladder incontinence: Yes: Decreased frequency Spinal tumors: No Cauda equina syndrome: No Compression fracture: No Abdominal aneurysm: No   COGNITION: 08/11/2021 Overall cognitive status: Within functional limits for tasks assessed                          SENSATION: 08/11/2021 [facility]   POSTURE:  08/11/2021 Forward head, IR and protracted shoulders, shoulders hiked, decreased lumbar lordosis.   LUMBAR & CERVICAL ROM:    Active  AROM  08/11/2021 AROM 09/02/2021 AROM 09/16/2021 AROM 09/23/2021  Cervical Extension 55 60 30 degrees c pain  60  Lumbar Extension 0 5  10  Cervical Right lateral flexion 15 25  20   Left lateral flexion Cervical 10 10  20   Right rotation Cervical 20 30 40 c pain 50  Left rotation Cervical 15 25 30  c pain 50   (Blank rows = not tested)     Strength (in pounds):   Hand held-dynamometry in pounds Right 08/11/2021 Left 08/11/2021 Lt/Rt 09/02/2021 09/23/2021 L/R in pounds 10/18/2021  Cervical lateral bending < 3 < 3 <3/5.2 7.0/8.8 7.8/16.9  Cervical extension 5.5   8.1 6.1 21.7 pounds  Hip abduction         Hip adduction         Hip internal rotation         Hip external rotation         Knee flexion         Knee extension         Ankle dorsiflexion         Ankle plantarflexion         Ankle inversion         Ankle eversion          (Blank rows = not tested)     TODAY'S TREATMENT  10/28/2021 Hip hike in door way 2 sets of 5 for 5 seconds Supine Bridging 10X 5 seconds Side lie clams 10X 3 seconds B Prone alternating hip extensions 2 sets of 10 for 3 seconds Shoulder blade pinches (SBP, avoid hike) 10X 5 seconds Lumbar extension AROM 10X 3 seconds (hips forward, needed less correction) Cervical rotation AROM (SBP 1st) 10X 5 seconds Cervical extension Isometrics 10X 5 seconds Cervical lateral bending Isometrics 5X each side for 5 seconds Pull to chest Blue 10X (Row)   10/18/2021 Hip hike in door way 2 sets  of 5 for 3 seconds Supine Bridging 2 sets of 10X 3 seconds Side lie clams 10X 3 seconds B Shoulder blade pinches (SBP, avoid hike) 10X 5 seconds Lumbar extension AROM 10X 3 seconds (hips forward, needed less correction) Cervical rotation AROM (SBP 1st) 10X 5 seconds Cervical extension Isometrics 10X 5 seconds Cervical lateral bending Isometrics 5X each side for 5 seconds Pull to chest Blue 10X (Row)  Functional Activities: Review log roll and discussion of correct and incorrect body mechanics   10/04/2021 Hip hike in door way 2 sets of 5 for  3 seconds Supine Bridging 10X 3 seconds Shoulder blade pinches (SBP, avoid hike) 10X 5 seconds Lumbar extension AROM 10X 3 seconds (hips forward, needed less correction) Cervical rotation AROM (SBP 1st) 10X 5 seconds Heel to toe raises with paper towel roll to avoid trunk extension/flexion 2 sets of 10 for 3 seconds Cervical extension Isometrics 10X 5 seconds Cervical lateral bending Isometrics 5X each side for 5 seconds Pull to chest Blue 10X (Row)  Functional Activities: correct mechanics for getting dressed (socks and shoes), review log roll and discussion of correct and incorrect body mechanics with gym activities (avoid sit ups)     PATIENT EDUCATION:  09/23/2021 Education details: Review progress note and HEP Person educated: Patient Education method: Explanation, Demonstration, Tactile cues, Verbal cues, and Handouts Education comprehension: verbalized understanding, returned demonstration, verbal cues required, tactile cues required, and needs further education     HOME EXERCISE PROGRAM: Access Code: [email or web address] URL: [email or web address] Date: 10/28/2021 Prepared by: Vista Mink  Exercises - Standing Scapular Retraction  - 5 x daily - 7 x weekly - 1 sets - 5 reps - 5 second hold - Standing Lumbar Extension at [place]  - 5 x daily - 7 x weekly - 1 sets - 5 reps - 3 seconds hold - Seated Cervical Rotation AROM   - 1 x daily - 7 x weekly - 1 sets - 10 reps - 5 seconds hold - Standing Isometric Cervical Extension with Manual Resistance  - 5 x daily - 7 x weekly - 1 sets - 5 reps - 5 hold - Heel Toe Raises with Counter Support  - 3 x daily - 1-7 x weekly - 1 sets - 10 reps - 3 seconds hold - Standing Isometric Cervical Sidebending with Manual Resistance  - 3 x daily - 7 x weekly - 1 sets - 5 reps - 5 hold - Standing Hip Hiking  - 3 x daily - 7 x weekly - 2 sets - 5 reps - 3 seconds hold - Yoga Bridge  - 2 x daily - 1-7 x weekly - 2 sets - 10 reps - 5 seconds hold - Clamshell  - 2 x daily - 1-7 x weekly - 2 sets - 10 reps - 3 seconds hold - Prone Hip Extension  - 1 x daily - 7 x weekly - 2-3 sets - 10 reps - 3 seconds hold  ASSESSMENT:   CLINICAL IMPRESSION: Kaula is making slow but continued progress towards long-term goals.  Neck pain has been minimal while her back continue to improve, although more slowly.  We progressed back strength today and emphasized certain exercises (7X/week vs optional exercises 1-7X/week) to speed low back pain recovery.  I anticipate Zyrah will be ready for independent rehabilitation in 1-2 visits.     OBJECTIVE IMPAIRMENTS Abnormal gait, decreased activity tolerance, decreased endurance, decreased knowledge of condition, decreased mobility, difficulty walking, decreased ROM, decreased strength, decreased safety awareness, increased muscle spasms, impaired flexibility, impaired UE functional use, improper body mechanics, postural dysfunction, obesity, and pain.    ACTIVITY LIMITATIONS cleaning, community activity, and personal finances.    PERSONAL FACTORS Pre-diabetes, B rotator cuff issues (R RTC repair), obesity, HTN, hypothyroid, OA B knees are also affecting patient's functional outcome.      REHAB POTENTIAL: Good   CLINICAL DECISION MAKING: Stable/uncomplicated   EVALUATION COMPLEXITY: Low     GOALS: Goals reviewed with patient? Yes   SHORT TERM GOALS:  Target date: 09/08/2021   Improve lumbar extension AROM  to 10 degrees. Baseline: 0 degrees Goal status: Met 09/23/2021   2.  Improve cervical strength for extension to at least 15 pounds and lateral bending to at least 8 pounds. Baseline: < 6 and < 3, respectively Goal status: Met 2 of 3 10/18/2021    3.  Anjenette will demonstrate independence with her starter HEP. Baseline: Started 08/11/2021 Goal status: Met 09/02/2021   LONG TERM GOALS: Target date: 12/09/2021   Improve FOTO to 60 in 11 visits. Baseline: 35 Goal status: On Going - 56 on 10/18/2021   2.  Eldean will report neck, back and lateral torso pain consistently 0-3/10 on the VAS. Baseline: 4-9/10 Goal status: Partially met - assessed 10/28/2021   3.  Improve cervical AROM for extension to 60 degrees; lateral bending to 20 degrees B and rotation to 50 degrees B. Baseline: 55; 10/15; 15/20 degrees respectively (L/R) Goal status: Met 09/23/2021   4.  Improve cervical and spine strength as assessed by FOTO and objective measures. Baseline: See FOTO and objective strength Goal status: On Going 10/18/2021   5.  Amariah will be independent with her long-term HEP at DC. Baseline: Started 08/11/2021 Goal status: On Going - assessed 10/28/2021     PLAN: PT FREQUENCY: 1-2x/week   PT DURATION: 6 additional weeks (by 12/09/2021)   PLANNED INTERVENTIONS: Therapeutic exercises, Therapeutic activity, Gait training, Patient/Family education, Joint mobilization, Dry Needling, Electrical stimulation, Cryotherapy, Moist heat, Traction, and Manual therapy.   PLAN FOR NEXT SESSION:   Continue scapular, postural and low back strength progressions.  Review body mechanics and the importance of avoiding flexion as needed.  Anticipate DC in 1-2 visits.     Farley Ly, PT, MPT 10/28/21  4:43 PM

## 2021-11-04 ENCOUNTER — Encounter: Payer: Self-pay | Admitting: Rehabilitative and Restorative Service Providers"

## 2021-11-04 ENCOUNTER — Ambulatory Visit: Payer: Medicare HMO | Admitting: Rehabilitative and Restorative Service Providers"

## 2021-11-04 DIAGNOSIS — R293 Abnormal posture: Secondary | ICD-10-CM

## 2021-11-04 DIAGNOSIS — M5459 Other low back pain: Secondary | ICD-10-CM

## 2021-11-04 DIAGNOSIS — M542 Cervicalgia: Secondary | ICD-10-CM | POA: Diagnosis not present

## 2021-11-04 DIAGNOSIS — R262 Difficulty in walking, not elsewhere classified: Secondary | ICD-10-CM

## 2021-11-04 NOTE — Therapy (Addendum)
OUTPATIENT PHYSICAL THERAPY TREATMENT NOTE   Patient Name: Kathleen Mcfarland MRN: [id number] DOB:[birth year], 61 y.o., female Today's Date: 01/06/2022    PCP: Eunice Blase, MD REFERRING PROVIDER: Meredith Pel, MD  END OF SESSION:   PHYSICAL THERAPY DISCHARGE SUMMARY  Visits from Start of Care: 9  Current functional level related to goals / functional outcomes: See note   Remaining deficits: See note   Education / Equipment: Updated HEP   Patient agrees to discharge. Patient goals were met. Patient is being discharged due to being pleased with the current functional level.        Past Medical History:  Diagnosis Date   Allergic rhinitis    Anxiety    Back pain    Cataract    Complication of anesthesia    hard to wake up   Depression    Hypertension    Hypothyroidism    Osteoarthritis 2012   bilateral knees   Osteoarthritis of knee    Osteoporosis    Past Surgical History:  Procedure Laterality Date   COLONOSCOPY  09/05/2016   Dr.Jacobs   DILATION AND CURETTAGE OF UTERUS     ROTATOR CUFF REPAIR     SHOULDER ARTHROSCOPY  04/13/2011   Procedure: ARTHROSCOPY SHOULDER;  Surgeon: Ninetta Lights, MD;  Location: [place];  Service: Orthopedics;  Laterality: Right;  Right Shoulder Arthroscopy with Acrmioploasty, Arhtroscopic rotator cuff repair and Microfracture humeral head   SHOULDER ARTHROSCOPY WITH ROTATOR CUFF REPAIR Right 07/04/2012   Procedure: SHOULDER ARTHROSCOPY WITH ROTATOR CUFF REPAIR;  Surgeon: Ninetta Lights, MD;  Location: [place];  Service: Orthopedics;  Laterality: Right;  RIGHT SHOULDER ARTHROSCOPY WITH ARTHROSCOPIC ROTATOR CUFF REPAIR, LYSIS OF ADHESIONS   TUBAL LIGATION     Patient Active Problem List   Diagnosis Date Noted   Vitamin D deficiency 08/08/2019   Bilateral lower extremity edema 08/08/2019   Pre-diabetes 06/04/2017   Menopausal syndrome (hot flashes) 06/04/2017   History of colonic  polyps 06/04/2017   Chronic use of opiate for therapeutic purpose 07/03/2016   Disorder of rotator cuff of both shoulders 05/19/2015   Obesity (BMI 30-39.9) 05/19/2015   Severe recurrent major depressive disorder with psychotic features ([place]) 01/03/2014   Depression 01/02/2014   Urinary incontinence 07/21/2010   HYPERTENSION, BENIGN ESSENTIAL 07/31/2006   Hypothyroidism (acquired) 06/23/2006   Seasonal allergies 06/23/2006   Osteoarthrosis involving lower leg 06/23/2006    REFERRING DIAG: M54.50 (ICD-10-CM) - Low back pain, unspecified back pain laterality, unspecified chronicity, unspecified whether sciatica present   THERAPY DIAG: R29.3   M54.2   M54.59   R26.2   PERTINENT HISTORY: Pre-diabetes, bilateral rotator cuff issues (RT RTC repair), obesity, HTN, hypothyroid, OA bilateral knees.  PRECAUTIONS: no surgical or WB precautions  SUBJECTIVE: [place] notes her neck is near normal.  She notes the pain is much better with prescription pain meds only needed 1X this week and not related to the back or neck.  PAIN: NPRS scale: 0/10 (was 8/10) neck and back 1-3/10 (was 7/10) Pain location: Still mostly low back Pain description: Spasm (little bit) Aggravating factors: Prolonged postures, flexion Relieving factors: Muscle relaxer (1X this week) PRN, ibuprofen PRN (1X this week) and ice (none this week, ice encouraged)   OBJECTIVE:    DIAGNOSTIC FINDINGS:  08/11/2021 IE report:  Alignment: Normal. Skull base and vertebrae: No acute fracture. Soft tissues and spinal canal: No prevertebral fluid or swelling. No visible canal hematoma. Disc levels: Mild intervertebral disc  space narrowing and osteophyte formation is noted, predominantly at C6-C7 and C7-T1. Uncovertebral osteophyte formation and disc herniations are noted resulting in mild spinal canal stenosis.     PATIENT SURVEYS:  11/04/2021 FOTO 60 (Goal met in 9 visits, 11 visits predicted) 10/18/2021 FOTO 56 09/23/2021 FOTO 33 (was  35, Goal 60) 08/11/2021 FOTO 35 (Goal 60 in 11 visits)   SCREENING FOR RED FLAGS: 08/11/2021 Bowel or bladder incontinence: Yes: Decreased frequency Spinal tumors: No Cauda equina syndrome: No Compression fracture: No Abdominal aneurysm: No   COGNITION: 08/11/2021 Overall cognitive status: Within functional limits for tasks assessed                          SENSATION: 08/11/2021 [facility]   POSTURE:  08/11/2021 Forward head, IR and protracted shoulders, shoulders hiked, decreased lumbar lordosis.   LUMBAR & CERVICAL ROM:    Active  AROM  08/11/2021 AROM 09/02/2021 AROM 09/16/2021 AROM 09/23/2021 AROM 11/04/2021  Cervical Extension 55 60 30 degrees c pain  60 60  Lumbar Extension 0 _0 Cervical Right lateral flexion _1 Left lateral flexion Cervical _2 Right rotation Cervical 20 30 40 c pain 50 50  Left rotation Cervical _3 c pain 50 50   (Blank rows = not tested)     Strength (in pounds):   Hand held-dynamometry in pounds Right 08/11/2021 Left 08/11/2021 Lt/Rt 09/02/2021 09/23/2021 L/R in pounds 10/18/2021 11/04/2021  Cervical lateral bending < 3 < 3 <3/5.2 7.0/8.8 7.8/16.9 15.7/17.1  Cervical extension 5.5   8.1 6.1 21.7 pounds 24.0  Hip abduction          Hip adduction          Hip internal rotation          Hip external rotation          Knee flexion          Knee extension          Ankle dorsiflexion          Ankle plantarflexion          Ankle inversion          Ankle eversion           (Blank rows = not tested)     TODAY'S TREATMENT  11/04/2021 Hip hike in door way 2 sets of 5 for 5 seconds Prone alternating hip extensions 2 sets of 10 for 3 seconds Shoulder blade pinches (SBP, avoid hike) 10X 5 seconds Lumbar extension AROM 10X 3 seconds (hips forward, needed less correction) Cervical extension Isometrics 10X 5 seconds  Review updated HEP   10/28/2021 Hip hike in door way 2 sets of 5 for 5 seconds Supine Bridging 10X 5 seconds Side lie  clams 10X 3 seconds B Prone alternating hip extensions 2 sets of 10 for 3 seconds Shoulder blade pinches (SBP, avoid hike) 10X 5 seconds Lumbar extension AROM 10X 3 seconds (hips forward, needed less correction) Cervical rotation AROM (SBP 1st) 10X 5 seconds Cervical extension Isometrics 10X 5 seconds Cervical lateral bending Isometrics 5X each side for 5 seconds Pull to chest Blue 10X (Row)   10/18/2021 Hip hike in door way 2 sets of 5 for 3 seconds Supine Bridging 2 sets of 10X 3 seconds Side lie clams 10X 3 seconds B Shoulder blade pinches (SBP, avoid hike) 10X 5 seconds Lumbar extension AROM 10X 3 seconds (  hips forward, needed less correction) Cervical rotation AROM (SBP 1st) 10X 5 seconds Cervical extension Isometrics 10X 5 seconds Cervical lateral bending Isometrics 5X each side for 5 seconds Pull to chest Blue 10X (Row)  Functional Activities: Review log roll and discussion of correct and incorrect body mechanics   10/04/2021 Hip hike in door way 2 sets of 5 for 3 seconds Supine Bridging 10X 3 seconds Shoulder blade pinches (SBP, avoid hike) 10X 5 seconds Lumbar extension AROM 10X 3 seconds (hips forward, needed less correction) Cervical rotation AROM (SBP 1st) 10X 5 seconds Heel to toe raises with paper towel roll to avoid trunk extension/flexion 2 sets of 10 for 3 seconds Cervical extension Isometrics 10X 5 seconds Cervical lateral bending Isometrics 5X each side for 5 seconds Pull to chest Blue 10X (Row)  Functional Activities: correct mechanics for getting dressed (socks and shoes), review log roll and discussion of correct and incorrect body mechanics with gym activities (avoid sit ups)   PATIENT EDUCATION:  09/23/2021 Education details: Review progress note and HEP Person educated: Patient Education method: Explanation, Demonstration, Tactile cues, Verbal cues, and Handouts Education comprehension: verbalized understanding, returned demonstration, verbal cues  required, tactile cues required, and needs further education     HOME EXERCISE PROGRAM: Access Code: [email or web address] URL: [email or web address] Date: 11/04/2021 Prepared by: Vista Mink  Exercises - Standing Scapular Retraction  - 5 x daily - 7 x weekly - 1 sets - 5 reps - 5 second hold - Standing Lumbar Extension at [place]  - 5 x daily - 7 x weekly - 1 sets - 5 reps - 3 seconds hold - Seated Cervical Rotation AROM  - 1 x daily - 1 x weekly - 1 sets - 10 reps - 5 seconds hold - Standing Isometric Cervical Extension with Manual Resistance  - 3-5 x daily - 7 x weekly - 1 sets - 5 reps - 5 hold - Heel Toe Raises with Counter Support  - 3 x daily - 1 x weekly - 1 sets - 10 reps - 3 seconds hold - Standing Isometric Cervical Sidebending with Manual Resistance  - 3 x daily - 1 x weekly - 1 sets - 5 reps - 5 hold - Standing Hip Hiking  - 3 x daily - 7 x weekly - 1 sets - 5 reps - 3 seconds hold - Yoga Bridge  - 2 x daily - 1-7 x weekly - 2 sets - 10 reps - 5 seconds hold - Clamshell  - 2 x daily - 1-7 x weekly - 2 sets - 10 reps - 3 seconds hold - Prone Hip Extension  - 1 x daily - 7 x weekly - 2-3 sets - 10 reps - 3 seconds hold - Prone Hip Extension  - 1 x daily - 7 x weekly - 2-3 sets - 10 reps - 3 seconds hold   ASSESSMENT:   CLINICAL IMPRESSION: Veanna reports continued subjective and functional progress over the past week.  With continued HEP compliance, she should meet remaining LTGs with independent rehabilitation.  We reviewed her priority exercises and her progress note findings.  Zoelle is comfortable with the idea of independent rehabilitation and she is pleased with her progress in PT.     OBJECTIVE IMPAIRMENTS Abnormal gait, decreased activity tolerance, decreased endurance, decreased knowledge of condition, decreased mobility, difficulty walking, decreased ROM, decreased strength, decreased safety awareness, increased muscle spasms, impaired flexibility,  impaired UE functional use, improper body mechanics, postural dysfunction, obesity, and pain.  ACTIVITY LIMITATIONS cleaning, community activity, and personal finances.    PERSONAL FACTORS Pre-diabetes, B rotator cuff issues (R RTC repair), obesity, HTN, hypothyroid, OA B knees are also affecting patient's functional outcome.      REHAB POTENTIAL: Good   CLINICAL DECISION MAKING: Stable/uncomplicated   EVALUATION COMPLEXITY: Low     GOALS: Goals reviewed with patient? Yes   SHORT TERM GOALS: Target date: 09/08/2021   Improve lumbar extension AROM to 10 degrees. Baseline: 0 degrees Goal status: Met 09/23/2021   2.  Improve cervical strength for extension to at least 15 pounds and lateral bending to at least 8 pounds. Baseline: < 6 and < 3, respectively Goal status: Met 11/04/2021    3.  Ashima will demonstrate independence with her starter HEP. Baseline: Started 08/11/2021 Goal status: Met 09/02/2021   LONG TERM GOALS: Target date: 12/09/2021   Improve FOTO to 60 in 11 visits. Baseline: 35 Goal status: Met - 60 on  11/04/2021   2.  Maudine will report neck, back and lateral torso pain consistently 0-3/10 on the VAS. Baseline: 4-9/10 Goal status: Met - assessed 11/04/2021   3.  Improve cervical AROM for extension to 60 degrees; lateral bending to 20 degrees B and rotation to 50 degrees B. Baseline: 55; 10/15; 15/20 degrees respectively (L/R) Goal status: Met 09/23/2021   4.  Improve cervical and spine strength as assessed by FOTO and objective measures. Baseline: See FOTO and objective strength Goal status: On Going, better but needs improvement 11/04/2021   5.  Syrai will be independent with her long-term HEP at DC. Baseline: Started 08/11/2021 Goal status: Met - assessed 11/04/2021     PLAN: PT FREQUENCY: DC   PT DURATION: DC   PLANNED INTERVENTIONS: Therapeutic exercises, Therapeutic activity, Gait training, Patient/Family education, Joint mobilization, Dry Needling,  Electrical stimulation, Cryotherapy, Moist heat, Traction, and Manual therapy.   PLAN FOR NEXT SESSION:   DC     Farley Ly, PT, MPT 01/06/22  10:02 AM

## 2021-11-10 ENCOUNTER — Telehealth: Payer: Self-pay | Admitting: Gastroenterology

## 2021-11-10 ENCOUNTER — Encounter: Payer: Self-pay | Admitting: Gastroenterology

## 2021-11-10 DIAGNOSIS — Z800 Family history of malignant neoplasm of digestive organs: Secondary | ICD-10-CM

## 2021-11-10 DIAGNOSIS — Z86010 Personal history of colonic polyps: Secondary | ICD-10-CM

## 2021-11-10 MED ORDER — PEG 3350-KCL-NA BICARB-NACL 420 G PO SOLR: 4000.0000 mL | Freq: Once | ORAL | 0 refills | Status: AC

## 2021-11-10 NOTE — Telephone Encounter (Signed)
Pt request prep the insurance will cover. Gol[facility] rx sent to [facility] and new instructions went over with pt and sent to [place] aware.

## 2021-11-10 NOTE — Telephone Encounter (Signed)
Called patient, no answer. Left a message.

## 2021-11-10 NOTE — Telephone Encounter (Signed)
Inbound call from patient call from patient stating that her insurance will not cover [place]. Patient stated she would like a alternative prep sent over to  [facility] on [address]. Please advise.

## 2021-11-11 ENCOUNTER — Encounter: Payer: Medicare HMO | Admitting: Rehabilitative and Restorative Service Providers"

## 2021-11-14 ENCOUNTER — Telehealth: Payer: Self-pay | Admitting: Gastroenterology

## 2021-11-14 ENCOUNTER — Encounter: Payer: Self-pay | Admitting: *Deleted

## 2021-11-14 NOTE — Telephone Encounter (Signed)
Good Afternoon Dr. Ardis Hughs,   Patient called stating that she needed to reschedule her colonoscopy for 7/21 at 3:30 due to not being able to take off work because other have put in time.  Patient was rescheduled for 9/1 at 3:30.

## 2021-11-14 NOTE — Telephone Encounter (Signed)
Sent new prep instructions to pt's mychart per pt's request.

## 2021-11-14 NOTE — Telephone Encounter (Signed)
Inbound call from patient rescheduling procedure for 7/21 at 3:30 due to her not having a care partner. Patient needs updated prep instructions via Mychart. Please give patient a call to further advise if necessary.  Thank you

## 2021-11-15 ENCOUNTER — Encounter: Payer: Medicare HMO | Admitting: Gastroenterology

## 2021-11-18 ENCOUNTER — Encounter: Payer: Medicare HMO | Admitting: Rehabilitative and Restorative Service Providers"

## 2021-11-18 ENCOUNTER — Encounter: Payer: Medicare HMO | Admitting: Gastroenterology

## 2021-11-22 DIAGNOSIS — M7732 Calcaneal spur, left foot: Secondary | ICD-10-CM | POA: Diagnosis not present

## 2021-11-22 DIAGNOSIS — M7662 Achilles tendinitis, left leg: Secondary | ICD-10-CM | POA: Diagnosis not present

## 2021-11-22 DIAGNOSIS — M79672 Pain in left foot: Secondary | ICD-10-CM | POA: Diagnosis not present

## 2021-11-22 DIAGNOSIS — M25572 Pain in left ankle and joints of left foot: Secondary | ICD-10-CM | POA: Diagnosis not present

## 2021-11-25 ENCOUNTER — Encounter: Payer: Medicare HMO | Admitting: Rehabilitative and Restorative Service Providers"

## 2021-12-02 ENCOUNTER — Encounter: Payer: Medicare HMO | Admitting: Rehabilitative and Restorative Service Providers"

## 2021-12-30 ENCOUNTER — Encounter: Payer: Self-pay | Admitting: Gastroenterology

## 2021-12-30 ENCOUNTER — Ambulatory Visit (AMBULATORY_SURGERY_CENTER): Payer: Medicare HMO | Admitting: Gastroenterology

## 2021-12-30 ENCOUNTER — Encounter: Payer: Medicare HMO | Admitting: Gastroenterology

## 2021-12-30 VITALS — BP 136/81 | HR 67 | Temp 98.6°F | Resp 13 | Ht 67.5 in | Wt 240.0 lb

## 2021-12-30 DIAGNOSIS — Z8371 Family history of colonic polyps: Secondary | ICD-10-CM | POA: Diagnosis not present

## 2021-12-30 DIAGNOSIS — Z86010 Personal history of colonic polyps: Secondary | ICD-10-CM | POA: Diagnosis not present

## 2021-12-30 DIAGNOSIS — Z09 Encounter for follow-up examination after completed treatment for conditions other than malignant neoplasm: Secondary | ICD-10-CM

## 2021-12-30 DIAGNOSIS — Z800 Family history of malignant neoplasm of digestive organs: Secondary | ICD-10-CM

## 2021-12-30 DIAGNOSIS — D122 Benign neoplasm of ascending colon: Secondary | ICD-10-CM | POA: Diagnosis not present

## 2021-12-30 MED ORDER — SODIUM CHLORIDE 0.9 % IV SOLN: 500.0000 mL | Freq: Once | INTRAVENOUS | Status: DC

## 2021-12-30 NOTE — Op Note (Signed)
[facility] Patient Name: Uriel Horkey Procedure Date: 12/30/2021 3:44 PM MRN: [id number] Endoscopist: Thornton Park MD, MD Age: 61 Referring MD:  Date of Birth: 13-Aug-1960 Gender: Female Account #: [id number] Procedure:                Colonoscopy Indications:              Surveillance: Personal history of adenomatous                            polyps on last colonoscopy 5 years ago                           Colonoscopy 2011: No polyps, left-sided                            diverticulosis                           Colonoscopy 2018: pancolonic diverticulosis, two                            small tubular adenomas                           MGM had colon and uterine cancer.                           Father had Colon cancer (diagnosed at age 54).                           Great uncle with colon cancer at age 63, great aunt                            with colon cancer at age 68                           Several family members with colon polyps (including                            brother and sister). Medicines:                Monitored Anesthesia Care Procedure:                Pre-Anesthesia Assessment:                           - Prior to the procedure, a History and Physical                            was performed, and patient medications and                            allergies were reviewed. The patient's tolerance of                            previous anesthesia was also reviewed. The risks  and benefits of the procedure and the sedation                            options and risks were discussed with the patient.                            All questions were answered, and informed consent                            was obtained. Prior Anticoagulants: The patient has                            taken no previous anticoagulant or antiplatelet                            agents. ASA Grade Assessment: II - A patient with                             mild systemic disease. After reviewing the risks                            and benefits, the patient was deemed in                            satisfactory condition to undergo the procedure.                           After obtaining informed consent, the colonoscope                            was passed under direct vision. Throughout the                            procedure, the patient's blood pressure, pulse, and                            oxygen saturations were monitored continuously. The                            Colonoscope was introduced through the anus and                            advanced to the the cecum, identified by                            appendiceal orifice and ileocecal valve. The                            colonoscopy was performed without difficulty. The                            patient tolerated the procedure well. The quality  of the bowel preparation was good. The ileocecal                            valve, appendiceal orifice, and rectum were                            photographed. Scope In: 3:52:33 PM Scope Out: 4:11:05 PM Scope Withdrawal Time: 0 hours 14 minutes 36 seconds  Total Procedure Duration: 0 hours 18 minutes 32 seconds  Findings:                 The perianal and digital rectal examinations were                            normal.                           Multiple small and large-mouthed diverticula were                            found in the entire colon.                           A 2 mm polyp was found in the ascending colon. The                            polyp was sessile. The polyp was removed with a                            cold snare. Resection and retrieval were complete.                            Estimated blood loss was minimal.                           The exam was otherwise without abnormality on                            direct and retroflexion views. Complications:            No immediate  complications. Estimated Blood Loss:     Estimated blood loss was minimal. Impression:               - Diverticulosis in the entire examined colon.                           - One 2 mm polyp in the ascending colon, removed                            with a cold snare. Resected and retrieved.                           - The examination was otherwise normal on direct                            and retroflexion views.  Recommendation:           - Patient has a contact number available for                            emergencies. The signs and symptoms of potential                            delayed complications were discussed with the                            patient. Return to normal activities tomorrow.                            Written discharge instructions were provided to the                            patient.                           - Resume previous diet.                           - Continue present medications.                           - Await pathology results.                           - Repeat colonoscopy in 5 years for surveillance                            given the family history regardless of pathology                            results.                           - Consider genetic testing given the family history.                           - Follow a high fiber diet. Drink at least 64                            ounces of water daily. Add a daily stool bulking                            agent such as psyllium (an exampled would be                            Metamucil).                           - Emerging evidence supports eating a diet of                            fruits, vegetables, grains,  calcium, and yogurt                            while reducing red meat and alcohol may reduce the                            risk of colon cancer.                           - Thank you for allowing me to be involved in your                            colon cancer  prevention. Thornton Park MD, MD 12/30/2021 4:23:47 PM This report has been signed electronically.

## 2021-12-30 NOTE — Progress Notes (Signed)
Pt's states no medical or surgical changes since previsit or office visit. 

## 2021-12-30 NOTE — Progress Notes (Signed)
Referring Provider: Eunice Blase, MD Primary Care Physician:  Mckinley Jewel, MD  Indication for Procedure:  Colon cancer Surveillance   IMPRESSION:  Need for colon cancer surveillance Appropriate candidate for monitored anesthesia care  PLAN: Colonoscopy in the [facility] today   HPI: Kathleen Mcfarland is a 61 y.o. female presents for surveillance colonoscopy.  Prior endoscopic history: Colonoscopy 2011: No polyps, left-sided diverticulosis Colonoscopy 2018: pancolonic diverticulosis, two small tubular adenomas  No baseline GI symptoms.   MGM had colon and uterine cancer. Father had  Colon cancer (diagnosed at age 28).   Great uncle with colon cancer at age 93, great aunt with colon cancer at age 63 Several family members with colon polyps (including brother and sister).  Past Medical History:  Diagnosis Date   Allergic rhinitis    Anxiety    Back pain    Cataract    Complication of anesthesia    hard to wake up   Depression    Hypertension    Hypothyroidism    Osteoarthritis 2012   bilateral knees   Osteoarthritis of knee    Osteoporosis     Past Surgical History:  Procedure Laterality Date   COLONOSCOPY  09/05/2016   Dr.Jacobs   DILATION AND CURETTAGE OF UTERUS     ROTATOR CUFF REPAIR     SHOULDER ARTHROSCOPY  04/13/2011   Procedure: ARTHROSCOPY SHOULDER;  Surgeon: Ninetta Lights, MD;  Location: [facility];  Service: Orthopedics;  Laterality: Right;  Right Shoulder Arthroscopy with Acrmioploasty, Arhtroscopic rotator cuff repair and Microfracture humeral head   SHOULDER ARTHROSCOPY WITH ROTATOR CUFF REPAIR Right 07/04/2012   Procedure: SHOULDER ARTHROSCOPY WITH ROTATOR CUFF REPAIR;  Surgeon: Ninetta Lights, MD;  Location: [facility];  Service: Orthopedics;  Laterality: Right;  RIGHT SHOULDER ARTHROSCOPY WITH ARTHROSCOPIC ROTATOR CUFF REPAIR, LYSIS OF ADHESIONS   TUBAL LIGATION      Current Outpatient Medications   Medication Sig Dispense Refill   acetaminophen (TYLENOL) 500 MG tablet Take 500 mg by mouth every 6 (six) hours as needed.     Ascorbic Acid (VITAMIN C) 500 MG CAPS Take 1 tab daily     baclofen (LIORESAL) 10 MG tablet Take 0.5-1 tablets (5-10 mg total) by mouth 2 (two) times daily as needed for muscle spasms. 60 tablet 11   cetirizine (ZYRTEC ALLERGY) 10 MG tablet Take 1 tablet (10 mg total) by mouth daily. 90 tablet 3   Cholecalciferol (VITAMIN D) 125 MCG (5000 UT) CAPS Take 125 mcg by mouth daily. 30 capsule 11   SYNTHROID 200 MCG tablet TAKE 1 TABLET BY MOUTH ONCE DAILY BEFORE BREAKFAST 90 tablet 1   triamterene-hydrochlorothiazide (MAXZIDE-25) 37.5-25 MG tablet TAKE 1 TABLET ONE TIME DAILY FOR BLOOD PRESSURE 90 tablet 3   Zinc 100 MG TABS Take 1 tab daily  0   ALPRAZolam (XANAX) 0.25 MG tablet Take 1 tablet (0.25 mg total) by mouth 2 (two) times daily as needed for anxiety. 30 tablet 0   ibuprofen (ADVIL) 800 MG tablet Take 1 tablet (800 mg total) by mouth every 8 (eight) hours as needed. 90 tablet 3   Specialty Vitamins Products (COLLAGEN ULTRA) CAPS Take 1 tab daily     UNABLE TO FIND JJ SMITH BLOOD SUGAR FOCUS JJ LIVER FOCUS BLUE SKY D3 WITH K2     vitamin B-12 (CYANOCOBALAMIN) 1000 MCG tablet Take 1,000 mcg by mouth daily.     Current Facility-Administered Medications  Medication Dose Route Frequency Provider Last  Rate Last Admin   0.9 %  sodium chloride infusion  500 mL Intravenous Once Thornton Park, MD        Allergies as of 12/30/2021 - Review Complete 12/30/2021  Allergen Reaction Noted   Penicillins Hives 07/19/2016   Felodipine Nausea And Vomiting 12/27/2006    Family History  Problem Relation Age of Onset   Diabetes Mother    Hypertension Mother    Coronary artery disease Father 69       heart disease diagnoised   Hypertension Father    Heart disease Father    Colon cancer Father        unknown age   Diabetes Father    Hyperlipidemia Father    Arthritis  Sister    Hypertension Sister    Diabetes Sister    Colon polyps Sister    Cervical cancer Sister    Other Sister        uterine fibroids   Hypertension Sister    Diabetes Sister    Colon polyps Sister    Colon polyps Brother    Colon cancer Maternal Grandmother    Ovarian cancer Maternal Grandmother    Dislocations Maternal Grandmother    Cirrhosis Maternal Grandfather    Heart disease Paternal Grandmother    Coronary artery disease Paternal Grandmother    Cancer Paternal Grandmother    Colon cancer Other        great uncle at 66, great aunt dx at 93, GM age 36   Colon polyps Other        2 sisters dx at age 32 and 4   Breast cancer Neg Hx    Stroke Neg Hx      Physical Exam: General:   Alert,  well-nourished, pleasant and cooperative in NAD Head:  Normocephalic and atraumatic. Eyes:  Sclera clear, no icterus.   Conjunctiva pink. Mouth:  No deformity or lesions.   Neck:  Supple; no masses or thyromegaly. Lungs:  Clear throughout to auscultation.   No wheezes. Heart:  Regular rate and rhythm; no murmurs. Abdomen:  Soft, non-tender, nondistended, normal bowel sounds, no rebound or guarding.  Msk:  Symmetrical. No boney deformities LAD: No inguinal or umbilical LAD Extremities:  No clubbing or edema. Neurologic:  Alert and  oriented x4;  grossly nonfocal Skin:  No obvious rash or bruise. Psych:  Alert and cooperative. Normal mood and affect.     Studies/Results: No results found.    Jaymin Waln L. Tarri Glenn, MD, MPH 12/30/2021, 3:37 PM

## 2021-12-30 NOTE — Patient Instructions (Addendum)
YOU HAD AN ENDOSCOPIC PROCEDURE TODAY AT THE [facility]:   Refer to the procedure report that was given to you for any specific questions about what was found during the examination.  If the procedure report does not answer your questions, please call your gastroenterologist to clarify.  If you requested that your care partner not be given the details of your procedure findings, then the procedure report has been included in a sealed envelope for you to review at your convenience later.  YOU SHOULD EXPECT: Some feelings of bloating in the abdomen. Passage of more gas than usual.  Walking can help get rid of the air that was put into your GI tract during the procedure and reduce the bloating. If you had a lower endoscopy (such as a colonoscopy or flexible sigmoidoscopy) you may notice spotting of blood in your stool or on the toilet paper. If you underwent a bowel prep for your procedure, you may not have a normal bowel movement for a few days.  Please Note:  You might notice some irritation and congestion in your nose or some drainage.  This is from the oxygen used during your procedure.  There is no need for concern and it should clear up in a day or so.  SYMPTOMS TO REPORT IMMEDIATELY:  Following lower endoscopy (colonoscopy or flexible sigmoidoscopy):  Excessive amounts of blood in the stool  Significant tenderness or worsening of abdominal pains  Swelling of the abdomen that is new, acute  Fever of 100F or higher  For urgent or emergent issues, a gastroenterologist can be reached at any hour by calling [phone]. Do not use MyChart messaging for urgent concerns.    DIET:  We do recommend a small meal at first, but then you may proceed to your regular diet.  Drink plenty of fluids but you should avoid alcoholic beverages for 24 hours.  ACTIVITY:  You should plan to take it easy for the rest of today and you should NOT DRIVE or use heavy machinery until tomorrow (because of  the sedation medicines used during the test).    FOLLOW UP: Our staff will call the number listed on your records the next business day following your procedure.  We will call around 7:15- 8:00 am to check on you and address any questions or concerns that you may have regarding the information given to you following your procedure. If we do not reach you, we will leave a message.  If you develop any symptoms (ie: fever, flu-like symptoms, shortness of breath, cough etc.) before then, please call [phone].  If you test positive for Covid 19 in the 2 weeks post procedure, please call and report this information to us.    If any biopsies were taken you will be contacted by phone or by letter within the next 1-3 weeks.  Please call us at [phone] if you have not heard about the biopsies in 3 weeks.    SIGNATURES/CONFIDENTIALITY: You and/or your care partner have signed paperwork which will be entered into your electronic medical record.  These signatures attest to the fact that that the information above on your After Visit Summary has been reviewed and is understood.  Full responsibility of the confidentiality of this discharge information lies with you and/or your care-partner.  

## 2021-12-30 NOTE — Progress Notes (Signed)
PT taken to PACU. Monitors in place. VSS. Report given to RN. 

## 2022-01-03 ENCOUNTER — Telehealth: Payer: Self-pay | Admitting: *Deleted

## 2022-01-03 NOTE — Telephone Encounter (Signed)
Left message on f/u call 

## 2022-01-06 ENCOUNTER — Encounter: Payer: Self-pay | Admitting: Gastroenterology

## 2022-01-09 DIAGNOSIS — E039 Hypothyroidism, unspecified: Secondary | ICD-10-CM | POA: Diagnosis not present

## 2022-01-09 DIAGNOSIS — I10 Essential (primary) hypertension: Secondary | ICD-10-CM | POA: Diagnosis not present

## 2022-01-09 DIAGNOSIS — F419 Anxiety disorder, unspecified: Secondary | ICD-10-CM | POA: Diagnosis not present

## 2022-01-09 LAB — LAB REPORT - SCANNED: EGFR: 99

## 2022-01-19 ENCOUNTER — Ambulatory Visit (INDEPENDENT_AMBULATORY_CARE_PROVIDER_SITE_OTHER): Payer: Medicare HMO | Admitting: Obstetrics and Gynecology

## 2022-01-19 ENCOUNTER — Other Ambulatory Visit (HOSPITAL_COMMUNITY)
Admission: RE | Admit: 2022-01-19 | Discharge: 2022-01-19 | Disposition: A | Discharge status: Home / Self Care | Payer: Medicare HMO | Source: Ambulatory Visit | Attending: Obstetrics and Gynecology | Admitting: Obstetrics and Gynecology

## 2022-01-19 ENCOUNTER — Encounter: Payer: Self-pay | Admitting: Obstetrics and Gynecology

## 2022-01-19 VITALS — BP 145/85 | HR 82 | Ht 67.0 in | Wt 240.3 lb

## 2022-01-19 DIAGNOSIS — Z01419 Encounter for gynecological examination (general) (routine) without abnormal findings: Secondary | ICD-10-CM | POA: Diagnosis not present

## 2022-01-19 NOTE — Progress Notes (Signed)
Subjective:     Kathleen Mcfarland is a 61 y.o. female P2 postmenopausal with BMI 37 who and is here for a comprehensive physical exam. The patient reports no problems. She is not sexually active. She denies any episodes of postmenopausal vaginal bleeding. She denies urinary incontinence. She admits to hot flashes, night sweats and mood swings. She is without any other complaints.  Past Medical History:  Diagnosis Date   Allergic rhinitis    Anxiety    Back pain    Cataract    Complication of anesthesia    hard to wake up   Depression    Hypertension    Hypothyroidism    Osteoarthritis 2012   bilateral knees   Osteoarthritis of knee    Osteoporosis    Past Surgical History:  Procedure Laterality Date   COLONOSCOPY  09/05/2016   Dr.Jacobs   DILATION AND CURETTAGE OF UTERUS     ROTATOR CUFF REPAIR     SHOULDER ARTHROSCOPY  04/13/2011   Procedure: ARTHROSCOPY SHOULDER;  Surgeon: Ninetta Lights, MD;  Location: [facility];  Service: Orthopedics;  Laterality: Right;  Right Shoulder Arthroscopy with Acrmioploasty, Arhtroscopic rotator cuff repair and Microfracture humeral head   SHOULDER ARTHROSCOPY WITH ROTATOR CUFF REPAIR Right 07/04/2012   Procedure: SHOULDER ARTHROSCOPY WITH ROTATOR CUFF REPAIR;  Surgeon: Ninetta Lights, MD;  Location: [facility];  Service: Orthopedics;  Laterality: Right;  RIGHT SHOULDER ARTHROSCOPY WITH ARTHROSCOPIC ROTATOR CUFF REPAIR, LYSIS OF ADHESIONS   TUBAL LIGATION     Family History  Problem Relation Age of Onset   Diabetes Mother    Hypertension Mother    Coronary artery disease Father 39       heart disease diagnoised   Hypertension Father    Heart disease Father    Colon cancer Father        unknown age   Diabetes Father    Hyperlipidemia Father    Arthritis Sister    Hypertension Sister    Diabetes Sister    Colon polyps Sister    Cervical cancer Sister    Other Sister        uterine fibroids    Hypertension Sister    Diabetes Sister    Colon polyps Sister    Colon polyps Brother    Colon cancer Maternal Grandmother    Ovarian cancer Maternal Grandmother    Dislocations Maternal Grandmother    Cirrhosis Maternal Grandfather    Heart disease Paternal Grandmother    Coronary artery disease Paternal Grandmother    Cancer Paternal Grandmother    Colon cancer Other        great uncle at 45, great aunt dx at 62, GM age 11   Colon polyps Other        2 sisters dx at age 18 and 29   Breast cancer Neg Hx    Stroke Neg Hx      Social History   Socioeconomic History   Marital status: Single    Spouse name: Not on file   Number of children: 2   Years of education: Not on file   Highest education level: Not on file  Occupational History   Occupation: None  Tobacco Use   Smoking status: Never   Smokeless tobacco: Never  Vaping Use   Vaping Use: Never used  Substance and Sexual Activity   Alcohol use: Yes    Comment: glass of wine once a month   Drug use: No  Sexual activity: Yes    Birth control/protection: Other-see comments    Comment: BTL  Other Topics Concern   Not on file  Social History Narrative   Regular Exercise- yes 3/week   Caffeine- no   Social Determinants of Health   Financial Resource Strain: Not on file  Food Insecurity: Not on file  Transportation Needs: Not on file  Physical Activity: Not on file  Stress: Not on file  Social Connections: Not on file  Intimate Partner Violence: Not on file   Health Maintenance  Topic Date Due   COVID-19 Vaccine (1) Never done   Zoster Vaccines- Shingrix (1 of 2) Never done   TETANUS/TDAP  05/01/2016   INFLUENZA VACCINE  11/29/2021   MAMMOGRAM  09/29/2023   PAP SMEAR-Modifier  01/18/2024   COLONOSCOPY (Pts 45-55yr Insurance coverage will need to be confirmed)  12/31/2026   Hepatitis C Screening  Completed   HIV Screening  Completed   HPV VACCINES  Aged Out       Review of Systems Pertinent items  noted in HPI and remainder of comprehensive ROS otherwise negative.   Objective:  Blood pressure (!) 145/85, pulse 82, height '5\' 7"'$  (1.702 m), weight 240 lb 4.8 oz (109 kg), last menstrual period 05/31/2018.   GENERAL: Well-developed, well-nourished female in no acute distress.  HEENT: Normocephalic, atraumatic. Sclerae anicteric.  NECK: Supple. Normal thyroid.  LUNGS: Clear to auscultation bilaterally.  HEART: Regular rate and rhythm. BREASTS: Symmetric in size. No palpable masses or lymphadenopathy, skin changes, or nipple drainage. ABDOMEN: Soft, nontender, nondistended. No organomegaly. PELVIC: Normal external female genitalia. Vagina is pale and rugated.  Normal discharge. Normal appearing cervix. Uterus is normal in size. No adnexal mass or tenderness. Chaperone present during the pelvic exam EXTREMITIES: No cyanosis, clubbing, or edema, 2+ distal pulses.     Assessment:    Healthy female exam.      Plan:   Pap smear and vaginal cultures collected per patient request for STI screening  Patient had a mammogram 08/2021 Patient had colonoscopy 12/30/2021 Health maintenance labs current Discussed the use of Black cohosh and other over the counter supplements to help alleviate vasomotor symptoms. Discussed HRT as an alternative option and patient declined at this time See After Visit Summary for Counseling Recommendations

## 2022-01-19 NOTE — Progress Notes (Signed)
Patient presents for annual exam with PAP. No concerns at this time.

## 2022-01-20 LAB — CYTOLOGY - PAP
Chlamydia: NEGATIVE
Comment: NEGATIVE
Comment: NORMAL
Diagnosis: NEGATIVE
Neisseria Gonorrhea: NEGATIVE

## 2022-02-03 ENCOUNTER — Ambulatory Visit: Payer: Medicare HMO | Admitting: Orthopedic Surgery

## 2022-02-06 ENCOUNTER — Ambulatory Visit: Payer: Medicare HMO | Admitting: Orthopedic Surgery

## 2022-02-06 DIAGNOSIS — M1712 Unilateral primary osteoarthritis, left knee: Secondary | ICD-10-CM

## 2022-02-06 DIAGNOSIS — M170 Bilateral primary osteoarthritis of knee: Secondary | ICD-10-CM | POA: Diagnosis not present

## 2022-02-06 DIAGNOSIS — M1711 Unilateral primary osteoarthritis, right knee: Secondary | ICD-10-CM

## 2022-02-12 ENCOUNTER — Encounter: Payer: Self-pay | Admitting: Orthopedic Surgery

## 2022-02-12 MED ORDER — HYALURONAN 88 MG/4ML IX SOSY
88.0000 mg | PREFILLED_SYRINGE | INTRA_ARTICULAR | Status: AC | PRN
  Administered 2022-02-06: 88 mg via INTRA_ARTICULAR

## 2022-02-12 MED ORDER — BUPIVACAINE HCL 0.25 % IJ SOLN
4.0000 mL | INTRAMUSCULAR | Status: AC | PRN
  Administered 2022-02-06: 4 mL via INTRA_ARTICULAR

## 2022-02-12 MED ORDER — LIDOCAINE HCL 1 % IJ SOLN
5.0000 mL | INTRAMUSCULAR | Status: AC | PRN
  Administered 2022-02-06: 5 mL

## 2022-02-12 NOTE — Progress Notes (Signed)
   Procedure Note  Patient: Kathleen Mcfarland             Date of Birth: 03-17-1961           MRN: [id number]             Visit Date: 02/06/2022  Procedures: Visit Diagnoses:  1. Unilateral primary osteoarthritis, left knee   2. Unilateral primary osteoarthritis, right knee     Large Joint Inj: R knee on 02/06/2022 11:04 PM Indications: pain, joint swelling and diagnostic evaluation Details: 18 G 1.5 in needle, superolateral approach  Arthrogram: No  Medications: 5 mL lidocaine 1 %; 88 mg Hyaluronan 88 MG/4ML Outcome: tolerated well, no immediate complications Procedure, treatment alternatives, risks and benefits explained, specific risks discussed. Consent was given by the patient. Immediately prior to procedure a time out was called to verify the correct patient, procedure, equipment, support staff and site/side marked as required. Patient was prepped and draped in the usual sterile fashion.    Large Joint Inj: L knee on 02/06/2022 11:05 PM Indications: diagnostic evaluation, joint swelling and pain Details: 18 G 1.5 in needle, superolateral approach  Arthrogram: No  Medications: 5 mL lidocaine 1 %; 4 mL bupivacaine 0.25 %; 88 mg Hyaluronan 88 MG/4ML Outcome: tolerated well, no immediate complications Procedure, treatment alternatives, risks and benefits explained, specific risks discussed. Consent was given by the patient. Immediately prior to procedure a time out was called to verify the correct patient, procedure, equipment, support staff and site/side marked as required. Patient was prepped and draped in the usual sterile fashion.

## 2022-02-14 ENCOUNTER — Other Ambulatory Visit: Payer: Self-pay

## 2022-02-14 DIAGNOSIS — M1712 Unilateral primary osteoarthritis, left knee: Secondary | ICD-10-CM

## 2022-02-14 DIAGNOSIS — M1711 Unilateral primary osteoarthritis, right knee: Secondary | ICD-10-CM

## 2022-04-11 DIAGNOSIS — F419 Anxiety disorder, unspecified: Secondary | ICD-10-CM | POA: Diagnosis not present

## 2022-04-17 ENCOUNTER — Ambulatory Visit: Payer: Medicare HMO | Admitting: Orthopedic Surgery

## 2022-05-02 DIAGNOSIS — H5213 Myopia, bilateral: Secondary | ICD-10-CM | POA: Diagnosis not present

## 2022-05-02 DIAGNOSIS — H524 Presbyopia: Secondary | ICD-10-CM | POA: Diagnosis not present

## 2022-05-08 ENCOUNTER — Ambulatory Visit (INDEPENDENT_AMBULATORY_CARE_PROVIDER_SITE_OTHER): Payer: Medicare HMO

## 2022-05-08 ENCOUNTER — Ambulatory Visit: Payer: Medicare HMO | Admitting: Orthopedic Surgery

## 2022-05-08 ENCOUNTER — Telehealth: Payer: Self-pay | Admitting: Orthopedic Surgery

## 2022-05-08 DIAGNOSIS — M25572 Pain in left ankle and joints of left foot: Secondary | ICD-10-CM

## 2022-05-08 DIAGNOSIS — M6702 Short Achilles tendon (acquired), left ankle: Secondary | ICD-10-CM

## 2022-05-08 DIAGNOSIS — M7662 Achilles tendinitis, left leg: Secondary | ICD-10-CM | POA: Diagnosis not present

## 2022-05-08 NOTE — Telephone Encounter (Signed)
Pt called with a complaint. She did not say what the complaint was about or who the complaint is with. She states she want to speak to Supervisor before she put complaint online. Please call pt at [phone].

## 2022-05-08 NOTE — Telephone Encounter (Signed)
I called, discussed issue with a smell and dirty floors in office.  I will f/u with staff and janitorial services.

## 2022-05-09 ENCOUNTER — Encounter: Payer: Self-pay | Admitting: Orthopedic Surgery

## 2022-05-09 NOTE — Progress Notes (Signed)
Office Visit Note   Patient: Kathleen Mcfarland           Date of Birth: May 01, 1961           MRN: [id number] Visit Date: 05/08/2022              Requested by: Mckinley Jewel, MD [address] [place],  [place] [address] PCP: Mckinley Jewel, MD  Chief Complaint  Patient presents with   Left Ankle - Pain      HPI: Patient is a 62 year old woman who is seen for initial evaluation and referral for a second opinion left ankle pain.  Patient states she did see podiatry and she states she was given 3 diagnosis including arthralgia calcaneal heel spurs and Achilles tendinitis.  Patient states that recommendation was to proceed with a cortisone injection and patient states that she did not want to have an injection.  She states she is on prednisone 5 mg a day.  She states she has not taken because of the potential side effects.  Assessment & Plan: Visit Diagnoses:  1. Pain in left ankle and joints of left foot   2. Contracture of left Achilles tendon   3. Achilles tendinitis of left lower extremity     Plan: Recommended Achilles stretching and 2 different ways to stretch the Achilles was demonstrated.  Recommended Voltaren gel.  Reevaluate in 4 weeks.  Patient may benefit from extracorporeal shockwave therapy at the insertion of the Achilles.  Patient may also require gastrocnemius recession.  Follow-Up Instructions: Return in about 4 weeks (around 06/05/2022).   Ortho Exam  Patient is alert, oriented, no adenopathy, well-dressed, normal affect, normal respiratory effort. Examination patient has a palpable dorsalis pedis pulse.  Radiograph shows medial joint line narrowing of the tibial talar joint she does have good range of motion she is tender to palpation anteriorly over the ankle.  She has tenderness to palpation at the insertion of the Achilles with Achilles insertional tendinitis.  She has Achilles contracture with her knee extended she lacks 20 degrees of  dorsiflexion to neutral.  Imaging: XR Ankle Complete Left  Result Date: 05/09/2022 Three-view radiographs of the left ankle shows bony spurs at the talonavicular joint and midfoot.  There is a calcaneal spur and calcification at the insertion of the Achilles with a Haglund's deformity.  Ankle shows medial joint space narrowing with osteophytic bone spurs medially.  No images are attached to the encounter.  Labs: Lab Results  Component Value Date   HGBA1C 5.7 (H) 12/06/2020   HGBA1C 5.7 (H) 08/08/2019   HGBA1C 5.7 (H) 01/24/2019   ESRSEDRATE 8 01/24/2019     Lab Results  Component Value Date   ALBUMIN 3.8 07/15/2021   ALBUMIN 3.7 (L) 12/22/2019   ALBUMIN 3.5 (L) 08/08/2019    No results found for: "MG" Lab Results  Component Value Date   VD25OH 58 12/06/2020   VD25OH 35.4 12/22/2019   VD25OH 28.3 (L) 08/08/2019    No results found for: "PREALBUMIN"    Latest Ref Rng & Units 07/15/2021    7:03 PM 12/06/2020    2:15 PM 12/22/2019    9:13 AM  CBC EXTENDED  WBC 4.0 - 10.5 K/uL 6.3  4.4  3.8   RBC 3.87 - 5.11 MIL/uL 5.55  5.30  4.86   Hemoglobin 12.0 - 15.0 g/dL 15.5  14.8  13.8   HCT 36.0 - 46.0 % 47.1  45.1  41.8   Platelets  150 - 400 K/uL 254  251  256   NEUT# 1.7 - 7.7 K/uL 3.9  2,292    Lymph# 0.7 - 4.0 K/uL 1.8  1,654       There is no height or weight on file to calculate BMI.  Orders:  Orders Placed This Encounter  Procedures   XR Ankle Complete Left   No orders of the defined types were placed in this encounter.    Procedures: No procedures performed  Clinical Data: No additional findings.  ROS:  All other systems negative, except as noted in the HPI. Review of Systems  Objective: Vital Signs: LMP 05/31/2018 (Exact Date)   Specialty Comments:  No specialty comments available.  PMFS History: Patient Active Problem List   Diagnosis Date Noted   Vitamin D deficiency 08/08/2019   Bilateral lower extremity edema 08/08/2019   Pre-diabetes  06/04/2017   Menopausal syndrome (hot flashes) 06/04/2017   History of colonic polyps 06/04/2017   Chronic use of opiate for therapeutic purpose 07/03/2016   Disorder of rotator cuff of both shoulders 05/19/2015   Obesity (BMI 30-39.9) 05/19/2015   Severe recurrent major depressive disorder with psychotic features ([facility]) 01/03/2014   Depression 01/02/2014   Urinary incontinence 07/21/2010   HYPERTENSION, BENIGN ESSENTIAL 07/31/2006   Hypothyroidism (acquired) 06/23/2006   Seasonal allergies 06/23/2006   Osteoarthrosis involving lower leg 06/23/2006   Past Medical History:  Diagnosis Date   Allergic rhinitis    Anxiety    Back pain    Cataract    Complication of anesthesia    hard to wake up   Depression    Hypertension    Hypothyroidism    Osteoarthritis 2012   bilateral knees   Osteoarthritis of knee    Osteoporosis     Family History  Problem Relation Age of Onset   Diabetes Mother    Hypertension Mother    Coronary artery disease Father 53       heart disease diagnoised   Hypertension Father    Heart disease Father    Colon cancer Father        unknown age   Diabetes Father    Hyperlipidemia Father    Arthritis Sister    Hypertension Sister    Diabetes Sister    Colon polyps Sister    Cervical cancer Sister    Other Sister        uterine fibroids   Hypertension Sister    Diabetes Sister    Colon polyps Sister    Colon polyps Brother    Colon cancer Maternal Grandmother    Ovarian cancer Maternal Grandmother    Dislocations Maternal Grandmother    Cirrhosis Maternal Grandfather    Heart disease Paternal Grandmother    Coronary artery disease Paternal Grandmother    Cancer Paternal Grandmother    Colon cancer Other        great uncle at 48, great aunt dx at 56, GM age 38   Colon polyps Other        2 sisters dx at age 46 and 53   Breast cancer Neg Hx    Stroke Neg Hx     Past Surgical History:  Procedure Laterality Date   COLONOSCOPY  09/05/2016    Dr.Jacobs   DILATION AND CURETTAGE OF UTERUS     ROTATOR CUFF REPAIR     SHOULDER ARTHROSCOPY  04/13/2011   Procedure: ARTHROSCOPY SHOULDER;  Surgeon: Ninetta Lights, MD;  Location: [facility];  Service: Orthopedics;  Laterality: Right;  Right Shoulder Arthroscopy with Acrmioploasty, Arhtroscopic rotator cuff repair and Microfracture humeral head   SHOULDER ARTHROSCOPY WITH ROTATOR CUFF REPAIR Right 07/04/2012   Procedure: SHOULDER ARTHROSCOPY WITH ROTATOR CUFF REPAIR;  Surgeon: Ninetta Lights, MD;  Location: [facility];  Service: Orthopedics;  Laterality: Right;  RIGHT SHOULDER ARTHROSCOPY WITH ARTHROSCOPIC ROTATOR CUFF REPAIR, LYSIS OF ADHESIONS   TUBAL LIGATION     Social History   Occupational History   Occupation: None  Tobacco Use   Smoking status: Never   Smokeless tobacco: Never  Vaping Use   Vaping Use: Never used  Substance and Sexual Activity   Alcohol use: Yes    Comment: glass of wine once a month   Drug use: No   Sexual activity: Yes    Birth control/protection: Other-see comments    Comment: BTL

## 2022-05-10 ENCOUNTER — Ambulatory Visit: Payer: Medicare HMO | Admitting: Orthopedic Surgery

## 2022-05-12 ENCOUNTER — Other Ambulatory Visit: Payer: Self-pay | Admitting: Internal Medicine

## 2022-05-12 DIAGNOSIS — Z1231 Encounter for screening mammogram for malignant neoplasm of breast: Secondary | ICD-10-CM

## 2022-05-24 DIAGNOSIS — F411 Generalized anxiety disorder: Secondary | ICD-10-CM | POA: Diagnosis not present

## 2022-06-05 ENCOUNTER — Ambulatory Visit: Payer: Medicare HMO | Admitting: Orthopedic Surgery

## 2022-06-19 ENCOUNTER — Ambulatory Visit: Payer: Medicare HMO | Admitting: Orthopedic Surgery

## 2022-07-24 ENCOUNTER — Ambulatory Visit: Payer: Medicare HMO | Admitting: Orthopedic Surgery

## 2022-08-03 ENCOUNTER — Ambulatory Visit: Payer: Medicare HMO | Admitting: Orthopedic Surgery

## 2022-08-07 ENCOUNTER — Ambulatory Visit: Payer: Medicare HMO | Admitting: Orthopedic Surgery

## 2022-08-07 ENCOUNTER — Other Ambulatory Visit: Payer: Self-pay

## 2022-08-07 ENCOUNTER — Telehealth: Payer: Self-pay

## 2022-08-07 DIAGNOSIS — M170 Bilateral primary osteoarthritis of knee: Secondary | ICD-10-CM

## 2022-08-07 DIAGNOSIS — M25572 Pain in left ankle and joints of left foot: Secondary | ICD-10-CM

## 2022-08-07 DIAGNOSIS — M1712 Unilateral primary osteoarthritis, left knee: Secondary | ICD-10-CM

## 2022-08-07 DIAGNOSIS — M1711 Unilateral primary osteoarthritis, right knee: Secondary | ICD-10-CM | POA: Diagnosis not present

## 2022-08-07 NOTE — Telephone Encounter (Signed)
Auth needed for bil knee gel  

## 2022-08-07 NOTE — Progress Notes (Signed)
Office Visit Note   Patient: Kathleen Mcfarland           Date of Birth: [birth year]           MRN: [id number] Visit Date: 08/07/2022 Requested by: Ollen BowlPahwani, Rinka R, MD [address] [place],  [place] [id number] PCP: Ollen BowlPahwani, Rinka R, MD  Subjective: Chief Complaint  Patient presents with   Right Knee - Pain   Left Knee - Pain    HPI: Kathleen BurkeBarbara Davis Even is a 62 y.o. female who presents to the office reporting bilateral knee pain is "I need some shots today".  Left knee hurts worse than the right.  She also reports left ankle Achilles tendinitis..  Would like to have some type of intervention on the ankle..                ROS: All systems reviewed are negative as they relate to the chief complaint within the history of present illness.  Patient denies fevers or chills.  Assessment & Plan: Visit Diagnoses:  1. Unilateral primary osteoarthritis, left knee   2. Unilateral primary osteoarthritis, right knee     Plan: Impression is bilateral knee arthritis.  Bilateral injections performed today with cortisone.  Does not really want total knee replacement anytime in the near future. This patient is diagnosed with osteoarthritis of the knee(s).    Radiographs show evidence of joint space narrowing, osteophytes, subchondral sclerosis and/or subchondral cysts.  This patient has knee pain which interferes with functional and activities of daily living.    This patient has experienced inadequate response, adverse effects and/or intolerance with conservative treatments such as acetaminophen, NSAIDS, topical creams, physical therapy or regular exercise, knee bracing and/or weight loss.   This patient has experienced inadequate response or has a contraindication to intra articular steroid injections for at least 3 months.   This patient is not scheduled to have a total knee replacement within 6 months of starting treatment with viscosupplementation.   Patient also has left  ankle Achilles tendinitis.  She may be a candidate for adjunct therapy with Dr. Shon BatonBrooks.  Preapproved gel for both knees and refer to Dr. Shon BatonBrooks for adjunct therapy for refractory Achilles tendinitis  Follow-Up Instructions: No follow-ups on file.   Orders:  No orders of the defined types were placed in this encounter.  No orders of the defined types were placed in this encounter.     Procedures: Large Joint Inj: bilateral knee on 08/07/2022 3:37 PM Indications: diagnostic evaluation, joint swelling and pain Details: 18 G 1.5 in needle, superolateral approach  Arthrogram: No  Medications (Right): 5 mL lidocaine 1 %; 4 mL bupivacaine 0.25 %; 40 mg methylPREDNISolone acetate 40 MG/ML Medications (Left): 5 mL lidocaine 1 %; 4 mL bupivacaine 0.25 %; 40 mg methylPREDNISolone acetate 40 MG/ML Outcome: tolerated well, no immediate complications Procedure, treatment alternatives, risks and benefits explained, specific risks discussed. Consent was given by the patient. Immediately prior to procedure a time out was called to verify the correct patient, procedure, equipment, support staff and site/side marked as required. Patient was prepped and draped in the usual sterile fashion.       Clinical Data: No additional findings.  Objective: Vital Signs: LMP 05/31/2018 (Exact Date)   Physical Exam:  Constitutional: Patient appears well-developed HEENT:  Head: Normocephalic Eyes:EOM are normal Neck: Normal range of motion Cardiovascular: Normal rate Pulmonary/chest: Effort normal Neurologic: Patient is alert Skin: Skin is warm Psychiatric: Patient has normal mood and affect  Ortho Exam: Bilateral knee exam demonstrates intact extensor mechanism with medial and lateral joint line tenderness.  Minimal effusion.  Range of motion is flexion past 90 and lacks about 5 degrees of full extension.  Collateral and cruciate ligaments are stable.  Pedal pulses palpable.  No groin pain on the right or  left-hand side with internal or external rotation.  Specialty Comments:  No specialty comments available.  Imaging: No results found.   PMFS History: Patient Active Problem List   Diagnosis Date Noted   Vitamin D deficiency 08/08/2019   Bilateral lower extremity edema 08/08/2019   Pre-diabetes 06/04/2017   Menopausal syndrome (hot flashes) 06/04/2017   History of colonic polyps 06/04/2017   Chronic use of opiate for therapeutic purpose 07/03/2016   Disorder of rotator cuff of both shoulders 05/19/2015   Obesity (BMI 30-39.9) 05/19/2015   Severe recurrent major depressive disorder with psychotic features ([facility]) 01/03/2014   Depression 01/02/2014   Urinary incontinence 07/21/2010   HYPERTENSION, BENIGN ESSENTIAL 07/31/2006   Hypothyroidism (acquired) 06/23/2006   Seasonal allergies 06/23/2006   Osteoarthrosis involving lower leg 06/23/2006   Past Medical History:  Diagnosis Date   Allergic rhinitis    Anxiety    Back pain    Cataract    Complication of anesthesia    hard to wake up   Depression    Hypertension    Hypothyroidism    Osteoarthritis 2012   bilateral knees   Osteoarthritis of knee    Osteoporosis     Family History  Problem Relation Age of Onset   Diabetes Mother    Hypertension Mother    Coronary artery disease Father 3558       heart disease diagnoised   Hypertension Father    Heart disease Father    Colon cancer Father        unknown age   Diabetes Father    Hyperlipidemia Father    Arthritis Sister    Hypertension Sister    Diabetes Sister    Colon polyps Sister    Cervical cancer Sister    Other Sister        uterine fibroids   Hypertension Sister    Diabetes Sister    Colon polyps Sister    Colon polyps Brother    Colon cancer Maternal Grandmother    Ovarian cancer Maternal Grandmother    Dislocations Maternal Grandmother    Cirrhosis Maternal Grandfather    Heart disease Paternal Grandmother    Coronary artery disease Paternal  Grandmother    Cancer Paternal Grandmother    Colon cancer Other        great uncle at 3950, great aunt dx at 3858, GM age 61   Colon polyps Other        2 sisters dx at age 61 and 1648   Breast cancer Neg Hx    Stroke Neg Hx     Past Surgical History:  Procedure Laterality Date   COLONOSCOPY  09/05/2016   Dr.Jacobs   DILATION AND CURETTAGE OF UTERUS     ROTATOR CUFF REPAIR     SHOULDER ARTHROSCOPY  04/13/2011   Procedure: ARTHROSCOPY SHOULDER;  Surgeon: Loreta Aveaniel F Murphy, MD;  Location: [facility];  Service: Orthopedics;  Laterality: Right;  Right Shoulder Arthroscopy with Acrmioploasty, Arhtroscopic rotator cuff repair and Microfracture humeral head   SHOULDER ARTHROSCOPY WITH ROTATOR CUFF REPAIR Right 07/04/2012   Procedure: SHOULDER ARTHROSCOPY WITH ROTATOR CUFF REPAIR;  Surgeon: Loreta Aveaniel F Murphy, MD;  Location:  [facility];  Service: Orthopedics;  Laterality: Right;  RIGHT SHOULDER ARTHROSCOPY WITH ARTHROSCOPIC ROTATOR CUFF REPAIR, LYSIS OF ADHESIONS   TUBAL LIGATION     Social History   Occupational History   Occupation: None  Tobacco Use   Smoking status: Never   Smokeless tobacco: Never  Vaping Use   Vaping Use: Never used  Substance and Sexual Activity   Alcohol use: Yes    Comment: glass of wine once a month   Drug use: No   Sexual activity: Yes    Birth control/protection: Other-see comments    Comment: BTL

## 2022-08-08 NOTE — Telephone Encounter (Signed)
VOB submitted for Monovisc, bilateral knee  

## 2022-08-13 ENCOUNTER — Encounter: Payer: Self-pay | Admitting: Orthopedic Surgery

## 2022-08-13 MED ORDER — METHYLPREDNISOLONE ACETATE 40 MG/ML IJ SUSP
40.0000 mg | INTRAMUSCULAR | Status: AC | PRN
  Administered 2022-08-07: 40 mg via INTRA_ARTICULAR

## 2022-08-13 MED ORDER — BUPIVACAINE HCL 0.25 % IJ SOLN
4.0000 mL | INTRAMUSCULAR | Status: AC | PRN
  Administered 2022-08-07: 4 mL via INTRA_ARTICULAR

## 2022-08-13 MED ORDER — LIDOCAINE HCL 1 % IJ SOLN
5.0000 mL | INTRAMUSCULAR | Status: AC | PRN
  Administered 2022-08-07: 5 mL

## 2022-09-08 ENCOUNTER — Ambulatory Visit (INDEPENDENT_AMBULATORY_CARE_PROVIDER_SITE_OTHER): Payer: Medicare HMO

## 2022-09-08 ENCOUNTER — Telehealth: Payer: Self-pay | Admitting: Nurse Practitioner

## 2022-09-08 ENCOUNTER — Ambulatory Visit (INDEPENDENT_AMBULATORY_CARE_PROVIDER_SITE_OTHER): Payer: Medicare HMO | Admitting: Nurse Practitioner

## 2022-09-08 VITALS — BP 136/72 | HR 80 | Temp 98.0°F | Ht 67.0 in | Wt 243.4 lb

## 2022-09-08 DIAGNOSIS — F333 Major depressive disorder, recurrent, severe with psychotic symptoms: Secondary | ICD-10-CM | POA: Diagnosis not present

## 2022-09-08 DIAGNOSIS — E039 Hypothyroidism, unspecified: Secondary | ICD-10-CM | POA: Diagnosis not present

## 2022-09-08 DIAGNOSIS — E559 Vitamin D deficiency, unspecified: Secondary | ICD-10-CM | POA: Diagnosis not present

## 2022-09-08 DIAGNOSIS — M152 Bouchard's nodes (with arthropathy): Secondary | ICD-10-CM | POA: Insufficient documentation

## 2022-09-08 DIAGNOSIS — E669 Obesity, unspecified: Secondary | ICD-10-CM | POA: Diagnosis not present

## 2022-09-08 DIAGNOSIS — I10 Essential (primary) hypertension: Secondary | ICD-10-CM | POA: Diagnosis not present

## 2022-09-08 DIAGNOSIS — R7303 Prediabetes: Secondary | ICD-10-CM | POA: Diagnosis not present

## 2022-09-08 DIAGNOSIS — M7989 Other specified soft tissue disorders: Secondary | ICD-10-CM | POA: Diagnosis not present

## 2022-09-08 LAB — COMPREHENSIVE METABOLIC PANEL
ALT: 32 U/L (ref 0–35)
AST: 30 U/L (ref 0–37)
Albumin: 3.8 g/dL (ref 3.5–5.2)
Alkaline Phosphatase: 49 U/L (ref 39–117)
BUN: 12 mg/dL (ref 6–23)
CO2: 28 mEq/L (ref 19–32)
Calcium: 9 mg/dL (ref 8.4–10.5)
Chloride: 103 mEq/L (ref 96–112)
Creatinine, Ser: 0.65 mg/dL (ref 0.40–1.20)
GFR: 95.57 mL/min (ref >60.00)
Glucose, Bld: 102 mg/dL — ABNORMAL HIGH (ref 70–99)
Potassium: 3.5 mEq/L (ref 3.5–5.1)
Sodium: 139 mEq/L (ref 135–145)
Total Bilirubin: 0.3 mg/dL (ref 0.2–1.2)
Total Protein: 6.4 g/dL (ref 6.0–8.3)

## 2022-09-08 LAB — T4, FREE: Free T4: 1.21 ng/dL (ref 0.60–1.60)

## 2022-09-08 LAB — CBC
HCT: 42.2 % (ref 36.0–46.0)
Hemoglobin: 14 g/dL (ref 12.0–15.0)
MCHC: 33.2 g/dL (ref 30.0–36.0)
MCV: 83.9 fl (ref 78.0–100.0)
Platelets: 239 10*3/uL (ref 150.0–400.0)
RBC: 5.03 Mil/uL (ref 3.87–5.11)
RDW: 13.7 % (ref 11.5–15.5)
WBC: 4.3 10*3/uL (ref 4.0–10.5)

## 2022-09-08 LAB — LIPID PANEL
Cholesterol: 165 mg/dL (ref 0–200)
HDL: 37.9 mg/dL — ABNORMAL LOW (ref >39.00)
LDL Cholesterol: 105 mg/dL — ABNORMAL HIGH (ref 0–99)
NonHDL: 127.02
Total CHOL/HDL Ratio: 4
Triglycerides: 109 mg/dL (ref 0.0–149.0)
VLDL: 21.8 mg/dL (ref 0.0–40.0)

## 2022-09-08 LAB — T3, FREE: T3, Free: 3 pg/mL (ref 2.3–4.2)

## 2022-09-08 LAB — HEMOGLOBIN A1C: Hgb A1c MFr Bld: 6.3 % (ref 4.6–6.5)

## 2022-09-08 LAB — VITAMIN D 25 HYDROXY (VIT D DEFICIENCY, FRACTURES): VITD: 32.73 ng/mL (ref 30.00–100.00)

## 2022-09-08 LAB — TSH: TSH: 3.09 u[IU]/mL (ref 0.35–5.50)

## 2022-09-08 MED ORDER — WEGOVY 0.25 MG/0.5ML ~~LOC~~ SOAJ
0.2500 mg | SUBCUTANEOUS | 0 refills | Status: DC
Start: 2022-09-08 — End: 2023-04-26

## 2022-09-08 NOTE — Telephone Encounter (Signed)
Please start prior auth for wegovy for obesity. Starting BMI 38

## 2022-09-08 NOTE — Assessment & Plan Note (Signed)
Chronic Currently well-controlled but does wax and wane in intensity. Discussed that I would recommend she be followed with psychiatry if she needs alprazolam prescribed on a chronic basis.  She reports she is agreeable to this.  Referral to psychiatry made today.

## 2022-09-08 NOTE — Assessment & Plan Note (Signed)
Chronic Possibly due to osteoarthritis versus rheumatoid arthritis.  Will check ANA, rheumatoid factor, and anti-CCP.  Further recommendations may be made based upon these results.

## 2022-09-08 NOTE — Assessment & Plan Note (Signed)
Chronic, stable Blood pressure acceptable on current regimen.  Patient to continue on Maxide 37.5-25 mg, 1 tablet by mouth daily

## 2022-09-08 NOTE — Assessment & Plan Note (Signed)
Chronic Check vitamin D level, further recommendations may be made based upon the results. For now continue vitamin D3 5000 IUs by mouth daily.

## 2022-09-08 NOTE — Assessment & Plan Note (Addendum)
Chronic Check thyroid panel, dose of Synthroid accordingly For now continue on Synthroid 200 mcg daily

## 2022-09-08 NOTE — Assessment & Plan Note (Signed)
Chronic Labs ordered, further recommendations may be made based upon the results. 

## 2022-09-08 NOTE — Patient Instructions (Signed)
[place]

## 2022-09-08 NOTE — Progress Notes (Signed)
New Patient Office Visit  Subjective    Patient ID: Kathleen Mcfarland, female    DOB: 09-Feb-1961  Age: 62 y.o. MRN: [id number]  CC:  Chief Complaint  Patient presents with   New Patient (Initial Visit)    Private issue  Weight issue  Blood work    Obesity    HPI Kalita Nasuti presents to establish care Her main concern today is obesity.  Has been unsuccessful with weight loss on her own and would like to discuss this further.  She reports history of hypothyroidism and underwent thyroidectomy approximately 40 years ago, she reports that she was not diagnosed with cancer it was just overactive thyroid.  She also has osteoarthritis which makes it challenging for her to exercise which she believes is contributing to her difficulty with weight loss.  She is on Synthroid 200 mcg daily, she cannot tolerate levothyroxine and needs the branded Synthroid. She has hypertension for which she takes Maxide and is tolerating well. She has anxiety and depression that waxes and wanes in intensity.  Onset occurred a few years ago when her sister passed away and shortly after her nephew was murdered.  She takes Xanax as needed, and used to attend counseling but did not feel it was helping her so she is no longer doing that. She is on vitamin D supplementation for treatment of vitamin D deficiency. She is concerned she may have rheumatoid arthritis because she has noted nodules on her proximal interphalangeal joints on bilateral hands.  She does notice intermittent swelling of these joints, she denies significant pain. She also tells me that she has been told she is prediabetic.   Her previous primary care provider was part of [facility], thus I am not able to review records at this time. Outpatient Encounter Medications as of 09/08/2022  Medication Sig   acetaminophen (TYLENOL) 500 MG tablet Take 650 mg by mouth every 6 (six) hours as needed.   ALPRAZolam (XANAX) 0.25 MG tablet Take 1 tablet (0.25  mg total) by mouth 2 (two) times daily as needed for anxiety.   Ascorbic Acid (VITAMIN C) 500 MG CAPS Take 1 tab daily   baclofen (LIORESAL) 10 MG tablet Take 0.5-1 tablets (5-10 mg total) by mouth 2 (two) times daily as needed for muscle spasms.   cetirizine (ZYRTEC ALLERGY) 10 MG tablet Take 1 tablet (10 mg total) by mouth daily.   Cholecalciferol (VITAMIN D) 125 MCG (5000 UT) CAPS Take 125 mcg by mouth daily.   Semaglutide-Weight Management (WEGOVY) 0.25 MG/0.5ML SOAJ Inject 0.25 mg into the skin once a week.   SYNTHROID 200 MCG tablet TAKE 1 TABLET BY MOUTH ONCE DAILY BEFORE BREAKFAST   triamterene-hydrochlorothiazide (MAXZIDE-25) 37.5-25 MG tablet TAKE 1 TABLET ONE TIME DAILY FOR BLOOD PRESSURE   UNABLE TO FIND JJ SMITH BLOOD SUGAR FOCUS JJ LIVER FOCUS BLUE SKY D3 WITH K2   vitamin B-12 (CYANOCOBALAMIN) 1000 MCG tablet Take 1,000 mcg by mouth daily.   Zinc 100 MG TABS Take 1 tab daily   [DISCONTINUED] ibuprofen (ADVIL) 800 MG tablet Take 1 tablet (800 mg total) by mouth every 8 (eight) hours as needed.   [DISCONTINUED] Specialty Vitamins Products (COLLAGEN ULTRA) CAPS Take 1 tab daily   No facility-administered encounter medications on file as of 09/08/2022.    Past Medical History:  Diagnosis Date   Allergic rhinitis    Anxiety    Back pain    Cataract    Complication of anesthesia    hard  to wake up   Depression    Hypertension    Hypothyroidism    Osteoarthritis 2012   bilateral knees   Osteoarthritis of knee    Osteoporosis     Past Surgical History:  Procedure Laterality Date   COLONOSCOPY  09/05/2016   Dr.Jacobs   DILATION AND CURETTAGE OF UTERUS     ROTATOR CUFF REPAIR     SHOULDER ARTHROSCOPY  04/13/2011   Procedure: ARTHROSCOPY SHOULDER;  Surgeon: Loreta Ave, MD;  Location: [facility];  Service: Orthopedics;  Laterality: Right;  Right Shoulder Arthroscopy with Acrmioploasty, Arhtroscopic rotator cuff repair and Microfracture humeral head    SHOULDER ARTHROSCOPY WITH ROTATOR CUFF REPAIR Right 07/04/2012   Procedure: SHOULDER ARTHROSCOPY WITH ROTATOR CUFF REPAIR;  Surgeon: Loreta Ave, MD;  Location: [facility];  Service: Orthopedics;  Laterality: Right;  RIGHT SHOULDER ARTHROSCOPY WITH ARTHROSCOPIC ROTATOR CUFF REPAIR, LYSIS OF ADHESIONS   TUBAL LIGATION      Family History  Problem Relation Age of Onset   Diabetes Mother    Hypertension Mother    Coronary artery disease Father 78       heart disease diagnoised   Hypertension Father    Heart disease Father    Colon cancer Father        unknown age   Diabetes Father    Hyperlipidemia Father    Arthritis Sister    Hypertension Sister    Diabetes Sister    Colon polyps Sister    Cervical cancer Sister    Other Sister        uterine fibroids   Hypertension Sister    Diabetes Sister    Colon polyps Sister    Colon polyps Brother    Colon cancer Maternal Grandmother    Ovarian cancer Maternal Grandmother    Dislocations Maternal Grandmother    Cirrhosis Maternal Grandfather    Heart disease Paternal Grandmother    Coronary artery disease Paternal Grandmother    Cancer Paternal Grandmother    Colon cancer Other        great uncle at 74, great aunt dx at 77, GM age 87   Colon polyps Other        2 sisters dx at age 24 and 70   Breast cancer Neg Hx    Stroke Neg Hx     Social History   Socioeconomic History   Marital status: Single    Spouse name: Not on file   Number of children: 2   Years of education: Not on file   Highest education level: Not on file  Occupational History   Occupation: None  Tobacco Use   Smoking status: Never   Smokeless tobacco: Never  Vaping Use   Vaping Use: Never used  Substance and Sexual Activity   Alcohol use: Yes    Comment: glass of wine once a month   Drug use: No   Sexual activity: Yes    Birth control/protection: Other-see comments    Comment: BTL  Other Topics Concern   Not on file  Social  History Narrative   Regular Exercise- yes 3/week   Caffeine- no   Social Determinants of Health   Financial Resource Strain: Not on file  Food Insecurity: Not on file  Transportation Needs: Not on file  Physical Activity: Not on file  Stress: Not on file  Social Connections: Not on file  Intimate Partner Violence: Not on file    ROS: see HPI  Objective    BP 136/72   Pulse 80   Temp 98 F (36.7 C) (Temporal)   Ht 5\' 7"  (1.702 m)   Wt 243 lb 6 oz (110.4 kg)   LMP 05/31/2018 (Exact Date)   SpO2 98%   BMI 38.12 kg/m      01/19/2022   10:28 AM 12/16/2019    4:54 PM 12/16/2019    4:49 PM  PHQ9 SCORE ONLY  PHQ-9 Total Score 0 0 0     Physical Exam Vitals reviewed.  Constitutional:      General: She is not in acute distress.    Appearance: Normal appearance.  HENT:     Head: Normocephalic and atraumatic.  Neck:     Vascular: No carotid bruit.  Cardiovascular:     Rate and Rhythm: Normal rate and regular rhythm.     Pulses: Normal pulses.     Heart sounds: Normal heart sounds.  Pulmonary:     Effort: Pulmonary effort is normal.     Breath sounds: Normal breath sounds.  Musculoskeletal:     Right hand: Deformity (bouchard nodes) present.     Left hand: Deformity (bouchard nodes) present.  Skin:    General: Skin is warm and dry.  Neurological:     General: No focal deficit present.     Mental Status: She is alert and oriented to person, place, and time.  Psychiatric:        Mood and Affect: Mood normal.        Behavior: Behavior normal.        Judgment: Judgment normal.         Assessment & Plan:   Problem List Items Addressed This Visit       Cardiovascular and Mediastinum   Hypertensive disorder - Primary    Chronic, stable Blood pressure acceptable on current regimen.  Patient to continue on Maxide 37.5-25 mg, 1 tablet by mouth daily      Relevant Orders   CBC   Comprehensive metabolic panel   Hemoglobin A1c   Lipid panel   TSH      Endocrine   Hypothyroidism (acquired) (Chronic)    Chronic Check thyroid panel, dose of Synthroid accordingly For now continue on Synthroid 200 mcg daily      Relevant Orders   CBC   Comprehensive metabolic panel   Hemoglobin A1c   Lipid panel   TSH   T3, free   T4, free     Musculoskeletal and Integument   Bouchard's node    Chronic Possibly due to osteoarthritis versus rheumatoid arthritis.  Will check ANA, rheumatoid factor, and anti-CCP.  Further recommendations may be made based upon these results.      Relevant Orders   Rheumatoid Factor   Antinuclear Antib (ANA)   Cyclic citrul peptide antibody, IgG ([facility])   DG Hand Complete Left   DG Hand Complete Right     Other   Severe recurrent major depressive disorder with psychotic features (HCC) (Chronic)    Chronic Currently well-controlled but does wax and wane in intensity. Discussed that I would recommend she be followed with psychiatry if she needs alprazolam prescribed on a chronic basis.  She reports she is agreeable to this.  Referral to psychiatry made today.      Relevant Orders   CBC   Comprehensive metabolic panel   Hemoglobin A1c   Lipid panel   TSH   Ambulatory referral to Psychiatry   Vitamin D deficiency (Chronic)  Chronic Check vitamin D level, further recommendations may be made based upon the results. For now continue vitamin D3 5000 IUs by mouth daily.      Relevant Orders   CBC   Comprehensive metabolic panel   Hemoglobin A1c   Lipid panel   TSH   VITAMIN D 25 Hydroxy (Vit-D Deficiency, Fractures)   Pre-diabetes    Chronic Labs ordered, further recommendations may be made based upon the results.      Relevant Orders   CBC   Comprehensive metabolic panel   Hemoglobin A1c   Lipid panel   TSH   Obesity (BMI 30-39.9)    Chronic Current BMI 38.12 Has associated prediabetes, hypertension. Labs ordered for further evaluation. Per shared decision making patient would like to  try GLP-1 agonist.  Will start patient on Wegovy 0.25 mg/week.  We discussed blackbox warnings, how to administer medication, what side effects to watch out for. Patient to follow-up in 1 month or sooner as needed.      Relevant Medications   Semaglutide-Weight Management (WEGOVY) 0.25 MG/0.5ML SOAJ   Other Relevant Orders   CBC   Comprehensive metabolic panel   Hemoglobin A1c   Lipid panel   TSH    Return in about 6 weeks (around 10/20/2022) for F/U with Maralyn Sago - need 40 minute appt. total time spent on today's encounter was 47 minutes.  This included face-to-face evaluation, discussion of patient's concerns, and development/discussion of treatment plan.  Elenore Paddy, NP

## 2022-09-08 NOTE — Assessment & Plan Note (Signed)
Chronic Current BMI 38.12 Has associated prediabetes, hypertension. Labs ordered for further evaluation. Per shared decision making patient would like to try GLP-1 agonist.  Will start patient on Wegovy 0.25 mg/week.  We discussed blackbox warnings, how to administer medication, what side effects to watch out for. Patient to follow-up in 1 month or sooner as needed.

## 2022-09-11 LAB — RHEUMATOID FACTOR: Rheumatoid fact SerPl-aCnc: <10 IU/mL (ref <14)

## 2022-09-11 LAB — CYCLIC CITRUL PEPTIDE ANTIBODY, IGG: Cyclic Citrullin Peptide Ab: <16 UNITS

## 2022-09-11 LAB — ANA: Anti Nuclear Antibody (ANA): NEGATIVE

## 2022-09-14 ENCOUNTER — Telehealth: Payer: Self-pay | Admitting: Nurse Practitioner

## 2022-09-14 NOTE — Telephone Encounter (Signed)
Patient called and asked for her most recent visit and lab result to be sent to [facility]. She said they use something called [id number] for her to receive rewards for visiting the doctor. Best callback for patient is [phone].

## 2022-09-14 NOTE — Telephone Encounter (Signed)
PA send to plan, waiting for approval Key: [id number]

## 2022-09-20 NOTE — Telephone Encounter (Signed)
Pt request forms is fax to [facility]

## 2022-09-21 NOTE — Telephone Encounter (Signed)
Pt has called about her PA for [facility]. I was able to inform the pt of the denial of her PA. She is now calling her insurance to see why her insurance denied it.

## 2022-09-29 DIAGNOSIS — G4733 Obstructive sleep apnea (adult) (pediatric): Secondary | ICD-10-CM | POA: Diagnosis not present

## 2022-09-29 DIAGNOSIS — R7303 Prediabetes: Secondary | ICD-10-CM | POA: Diagnosis not present

## 2022-09-29 DIAGNOSIS — F418 Other specified anxiety disorders: Secondary | ICD-10-CM | POA: Diagnosis not present

## 2022-09-29 DIAGNOSIS — J302 Other seasonal allergic rhinitis: Secondary | ICD-10-CM | POA: Diagnosis not present

## 2022-09-29 DIAGNOSIS — E039 Hypothyroidism, unspecified: Secondary | ICD-10-CM | POA: Diagnosis not present

## 2022-09-29 DIAGNOSIS — I10 Essential (primary) hypertension: Secondary | ICD-10-CM | POA: Diagnosis not present

## 2022-09-29 DIAGNOSIS — M170 Bilateral primary osteoarthritis of knee: Secondary | ICD-10-CM | POA: Diagnosis not present

## 2022-09-29 DIAGNOSIS — Z0000 Encounter for general adult medical examination without abnormal findings: Secondary | ICD-10-CM | POA: Diagnosis not present

## 2022-10-02 ENCOUNTER — Ambulatory Visit
Admission: RE | Admit: 2022-10-02 | Discharge: 2022-10-02 | Disposition: A | Discharge status: Home / Self Care | Payer: Medicare HMO | Source: Ambulatory Visit | Attending: Internal Medicine | Admitting: Internal Medicine

## 2022-10-02 DIAGNOSIS — Z1231 Encounter for screening mammogram for malignant neoplasm of breast: Secondary | ICD-10-CM | POA: Diagnosis not present

## 2022-10-19 ENCOUNTER — Telehealth: Payer: Self-pay | Admitting: Nurse Practitioner

## 2022-10-19 NOTE — Telephone Encounter (Signed)
Made provide aware of it. Noted of pt decision

## 2022-10-19 NOTE — Telephone Encounter (Signed)
Patient called and canceled appointment for 10/20/22. She said she decided to stay with her previous PCP.

## 2022-10-20 ENCOUNTER — Ambulatory Visit: Payer: Medicare HMO | Admitting: Nurse Practitioner

## 2022-10-26 ENCOUNTER — Ambulatory Visit: Payer: Medicare HMO | Admitting: Sports Medicine

## 2022-11-09 DIAGNOSIS — Z6838 Body mass index (BMI) 38.0-38.9, adult: Secondary | ICD-10-CM | POA: Diagnosis not present

## 2022-11-09 DIAGNOSIS — G4733 Obstructive sleep apnea (adult) (pediatric): Secondary | ICD-10-CM | POA: Diagnosis not present

## 2022-11-09 DIAGNOSIS — E039 Hypothyroidism, unspecified: Secondary | ICD-10-CM | POA: Diagnosis not present

## 2022-11-09 DIAGNOSIS — I10 Essential (primary) hypertension: Secondary | ICD-10-CM | POA: Diagnosis not present

## 2022-11-09 DIAGNOSIS — R7303 Prediabetes: Secondary | ICD-10-CM | POA: Diagnosis not present

## 2022-11-09 DIAGNOSIS — M170 Bilateral primary osteoarthritis of knee: Secondary | ICD-10-CM | POA: Diagnosis not present

## 2022-11-09 DIAGNOSIS — Z1331 Encounter for screening for depression: Secondary | ICD-10-CM | POA: Diagnosis not present

## 2022-11-27 ENCOUNTER — Ambulatory Visit (INDEPENDENT_AMBULATORY_CARE_PROVIDER_SITE_OTHER): Payer: Medicare HMO | Admitting: Sports Medicine

## 2022-11-27 ENCOUNTER — Encounter: Payer: Self-pay | Admitting: Sports Medicine

## 2022-11-27 DIAGNOSIS — M25572 Pain in left ankle and joints of left foot: Secondary | ICD-10-CM

## 2022-11-27 DIAGNOSIS — M19072 Primary osteoarthritis, left ankle and foot: Secondary | ICD-10-CM

## 2022-11-27 DIAGNOSIS — M9262 Juvenile osteochondrosis of tarsus, left ankle: Secondary | ICD-10-CM | POA: Diagnosis not present

## 2022-11-27 DIAGNOSIS — Q666 Other congenital valgus deformities of feet: Secondary | ICD-10-CM | POA: Diagnosis not present

## 2022-11-27 DIAGNOSIS — M7662 Achilles tendinitis, left leg: Secondary | ICD-10-CM

## 2022-11-27 NOTE — Progress Notes (Signed)
Dean referral for shockwave for left achilles

## 2022-11-27 NOTE — Progress Notes (Signed)
Kathleen Mcfarland - 62 y.o. female MRN [id number]  Date of birth: 08-May-1960  Office Visit Note: Visit Date: 11/27/2022 PCP: No primary care provider on file. Referred by: Cammy Copa, MD  Subjective: Chief Complaint  Patient presents with   Left Leg - Pain   HPI: Kathleen Mcfarland is a pleasant 62 y.o. female who presents today for left achilles tendinopathy.  She has pain over the posterior aspect of the Achilles and the heel but also states and has some pain within the ankle joint.  Initially saw by Dr. Lajoyce Corners and then recently has been seen by Dr. August Saucer, he is treating her knee OA as well, recently had corticosteroid injections.  She has not had any formalized physical therapy, not taking any medications currently for the foot or ankle.  Pertinent ROS were reviewed with the patient and found to be negative unless otherwise specified above in HPI.   Assessment & Plan: Visit Diagnoses:  1. Achilles tendinitis of left lower extremity   2. Pain in left ankle and joints of left foot   3. Pes planovalgus   4. Haglund's deformity of left heel   5. Primary osteoarthritis, left ankle and foot    Plan: Discussed with Kathleen Mcfarland today the nature of her left ankle and Achilles pain.  She does have enthesophytes at the distal Achilles as well as tendinopathy.  She also has pes planovalgus which is likely contributing to her foot and ankle pain with some arthritis.  We discussed on treating the Achilles today with extracorporeal shockwave therapy, treatment performed today which patient tolerated well.  We also got her into heel cups for the calcaneus as well as an ankle brace at her request.  Did show her pictures of the ankle body helix sleeve to help with compression.  We will bring her back in about 1 week for repeat ESWT and after 2 treatments we will see what sort of progress she makes -finding about 20% improvement or more, will likely shoot for somewhere between 5-7 sessions.    Additional treatment considerations: Orthotics versus shoe inserts was had to help correct her hindfoot valgus.  May consider formalized physical therapy as well.  Can consider short course of anti-inflammatories or prednisone.  Follow-up: Return in about 1 week (around 12/04/2022) for 1-1.5 weeks for right achilles/heel (reg visit).   Meds & Orders: No orders of the defined types were placed in this encounter.  No orders of the defined types were placed in this encounter.    Procedures: Procedure: ECSWT Indications:  Achillodynia   Procedure Details Consent: Risks of procedure as well as the alternatives and risks of each were explained to the patient.  Verbal consent for procedure obtained. Time Out: Verified patient identification, verified procedure, site was marked, verified correct patient position. The area was cleaned with alcohol swab.     The Left achilles and calcaneus was targeted for Extracorporeal shockwave therapy.    Preset: Achillodynia Power Level: 90 mJ Frequency: 10 Hz Impulse/cycles: 2500 Head size: Regular   Patient tolerated procedure well without immediate complications.       Clinical History: No specialty comments available.  She reports that she has never smoked. She has never used smokeless tobacco.  Recent Labs    09/08/22 0928  HGBA1C 6.3    Objective:   Vital Signs: LMP 05/31/2018 (Exact Date)   Physical Exam  Gen: Well-appearing, in no acute distress; non-toxic CV:  Well-perfused. Warm.  Resp: Breathing unlabored on room  air; no wheezing. Psych: Fluid speech in conversation; appropriate affect; normal thought process Neuro: Sensation intact throughout. No gross coordination deficits.   Ortho Exam - LLE: Positive TTP over the superior aspect of the calcaneus near the insertion of the Achilles tendon.  Negative calcaneal heel squeeze.  There is some limitation with dorsiflexion of the ankle. Evaluation of bilateral feet does show pes  planovalgus with hindfoot collapse medially.   Imaging:  -  XR ankle 05/08/22:Three-view radiographs of the left ankle shows bony spurs at the  talonavicular joint and midfoot.  There is a calcaneal spur and  calcification at the insertion of the Achilles with a Haglund's deformity.   Ankle shows medial joint space narrowing with osteophytic bone spurs  medially.   *Independent review and interpretation of 3 views of the left ankle including AP, oblique and lateral film was personally reviewed by myself today.  X-rays demonstrate enthesophytes with moderate spurring off the superior aspect of the calcaneus with a small Haglund's deformity.  There is a small plantar calcaneal spur.  There is mild to moderate osteoarthritic change at the talonavicular and cuneiform junction of the midfoot.  No acute fracture noted.  Past Medical/Family/Surgical/Social History: Medications & Allergies reviewed per EMR, new medications updated. Patient Active Problem List   Diagnosis Date Noted   Bouchard's node 09/08/2022   Hypertensive disorder 05/10/2021   Lymphadenopathy 03/11/2020   Neck mass 03/11/2020   Trigger finger 02/18/2020   Trigger thumb of right hand 01/15/2020   Vitamin D deficiency 08/08/2019   Bilateral lower extremity edema 08/08/2019   Pre-diabetes 06/04/2017   Menopausal syndrome (hot flashes) 06/04/2017   History of colonic polyps 06/04/2017   Chronic use of opiate for therapeutic purpose 07/03/2016   Disorder of rotator cuff of both shoulders 05/19/2015   Obesity (BMI 30-39.9) 05/19/2015   Severe recurrent major depressive disorder with psychotic features (HCC) 01/03/2014   Depression 01/02/2014   Urinary incontinence 07/21/2010   HYPERTENSION, BENIGN ESSENTIAL 07/31/2006   Hypothyroidism (acquired) 06/23/2006   Seasonal allergies 06/23/2006   Osteoarthrosis involving lower leg 06/23/2006   Past Medical History:  Diagnosis Date   Allergic rhinitis    Anxiety    Back pain     Cataract    Complication of anesthesia    hard to wake up   Depression    Hypertension    Hypothyroidism    Osteoarthritis 2012   bilateral knees   Osteoarthritis of knee    Osteoporosis    Family History  Problem Relation Age of Onset   Diabetes Mother    Hypertension Mother    Coronary artery disease Father 29       heart disease diagnoised   Hypertension Father    Heart disease Father    Colon cancer Father        unknown age   Diabetes Father    Hyperlipidemia Father    Arthritis Sister    Hypertension Sister    Diabetes Sister    Colon polyps Sister    Cervical cancer Sister    Other Sister        uterine fibroids   Hypertension Sister    Diabetes Sister    Colon polyps Sister    Colon polyps Brother    Colon cancer Maternal Grandmother    Ovarian cancer Maternal Grandmother    Dislocations Maternal Grandmother    Cirrhosis Maternal Grandfather    Heart disease Paternal Grandmother    Coronary artery disease Paternal Grandmother  Cancer Paternal Grandmother    Colon cancer Other        great uncle at 66, great aunt dx at 74, GM age 3   Colon polyps Other        2 sisters dx at age 60 and 64   Breast cancer Neg Hx    Stroke Neg Hx    Past Surgical History:  Procedure Laterality Date   COLONOSCOPY  09/05/2016   Dr.Jacobs   DILATION AND CURETTAGE OF UTERUS     ROTATOR CUFF REPAIR     SHOULDER ARTHROSCOPY  04/13/2011   Procedure: ARTHROSCOPY SHOULDER;  Surgeon: Loreta Ave, MD;  Location: [facility];  Service: Orthopedics;  Laterality: Right;  Right Shoulder Arthroscopy with Acrmioploasty, Arhtroscopic rotator cuff repair and Microfracture humeral head   SHOULDER ARTHROSCOPY WITH ROTATOR CUFF REPAIR Right 07/04/2012   Procedure: SHOULDER ARTHROSCOPY WITH ROTATOR CUFF REPAIR;  Surgeon: Loreta Ave, MD;  Location: [facility];  Service: Orthopedics;  Laterality: Right;  RIGHT SHOULDER ARTHROSCOPY WITH ARTHROSCOPIC ROTATOR  CUFF REPAIR, LYSIS OF ADHESIONS   TUBAL LIGATION     Social History   Occupational History   Occupation: None  Tobacco Use   Smoking status: Never   Smokeless tobacco: Never  Vaping Use   Vaping status: Never Used  Substance and Sexual Activity   Alcohol use: Yes    Comment: glass of wine once a month   Drug use: No   Sexual activity: Yes    Birth control/protection: Other-see comments    Comment: BTL

## 2022-12-05 ENCOUNTER — Ambulatory Visit: Payer: Medicare HMO | Admitting: Sports Medicine

## 2022-12-11 ENCOUNTER — Ambulatory Visit: Payer: Medicare HMO | Admitting: Sports Medicine

## 2022-12-12 ENCOUNTER — Encounter: Payer: Self-pay | Admitting: Emergency Medicine

## 2022-12-12 ENCOUNTER — Other Ambulatory Visit: Payer: Self-pay

## 2022-12-12 ENCOUNTER — Ambulatory Visit
Admission: EM | Admit: 2022-12-12 | Discharge: 2022-12-12 | Disposition: A | Discharge status: Home / Self Care | Payer: Medicare HMO | Attending: Physician Assistant | Admitting: Physician Assistant

## 2022-12-12 DIAGNOSIS — Z20822 Contact with and (suspected) exposure to covid-19: Secondary | ICD-10-CM | POA: Insufficient documentation

## 2022-12-12 NOTE — ED Triage Notes (Signed)
Pt sts was exposed to covid on Saturday and here requesting test; pt denies sx

## 2022-12-12 NOTE — ED Provider Notes (Signed)
[facility]    CSN: [id number] Arrival date & time: 12/12/22  1459      History   Chief Complaint Chief Complaint  Patient presents with   Covid Exposure    HPI Louisa Mcclenney is a 62 y.o. female.   Patient here today for COVID screening.  She states she was exposed 2 days ago.  She denies any symptoms at this time.  The history is provided by the patient.    Past Medical History:  Diagnosis Date   Allergic rhinitis    Anxiety    Back pain    Cataract    Complication of anesthesia    hard to wake up   Depression    Hypertension    Hypothyroidism    Osteoarthritis 2012   bilateral knees   Osteoarthritis of knee    Osteoporosis     Patient Active Problem List   Diagnosis Date Noted   Bouchard's node 09/08/2022   Hypertensive disorder 05/10/2021   Lymphadenopathy 03/11/2020   Neck mass 03/11/2020   Trigger finger 02/18/2020   Trigger thumb of right hand 01/15/2020   Vitamin D deficiency 08/08/2019   Bilateral lower extremity edema 08/08/2019   Pre-diabetes 06/04/2017   Menopausal syndrome (hot flashes) 06/04/2017   History of colonic polyps 06/04/2017   Chronic use of opiate for therapeutic purpose 07/03/2016   Disorder of rotator cuff of both shoulders 05/19/2015   Obesity (BMI 30-39.9) 05/19/2015   Severe recurrent major depressive disorder with psychotic features (HCC) 01/03/2014   Depression 01/02/2014   Urinary incontinence 07/21/2010   HYPERTENSION, BENIGN ESSENTIAL 07/31/2006   Hypothyroidism (acquired) 06/23/2006   Seasonal allergies 06/23/2006   Osteoarthrosis involving lower leg 06/23/2006    Past Surgical History:  Procedure Laterality Date   COLONOSCOPY  09/05/2016   Dr.Jacobs   DILATION AND CURETTAGE OF UTERUS     ROTATOR CUFF REPAIR     SHOULDER ARTHROSCOPY  04/13/2011   Procedure: ARTHROSCOPY SHOULDER;  Surgeon: Loreta Ave, MD;  Location: [facility];  Service: Orthopedics;  Laterality: Right;   Right Shoulder Arthroscopy with Acrmioploasty, Arhtroscopic rotator cuff repair and Microfracture humeral head   SHOULDER ARTHROSCOPY WITH ROTATOR CUFF REPAIR Right 07/04/2012   Procedure: SHOULDER ARTHROSCOPY WITH ROTATOR CUFF REPAIR;  Surgeon: Loreta Ave, MD;  Location: [facility];  Service: Orthopedics;  Laterality: Right;  RIGHT SHOULDER ARTHROSCOPY WITH ARTHROSCOPIC ROTATOR CUFF REPAIR, LYSIS OF ADHESIONS   TUBAL LIGATION      OB History     Gravida  2   Para  2   Term  2   Preterm      AB      Living  2      SAB      IAB      Ectopic      Multiple      Live Births  2            Home Medications    Prior to Admission medications   Medication Sig Start Date End Date Taking? Authorizing Provider  acetaminophen (TYLENOL) 500 MG tablet Take 650 mg by mouth every 6 (six) hours as needed.    [provider]  ALPRAZolam Prudy Feeler) 0.25 MG tablet Take 1 tablet (0.25 mg total) by mouth 2 (two) times daily as needed for anxiety. 07/30/20   Hilts, Michael, MD  Ascorbic Acid (VITAMIN C) 500 MG CAPS Take 1 tab daily 02/02/20   Lezlie Lye, [place],  MD  baclofen (LIORESAL) 10 MG tablet Take 0.5-1 tablets (5-10 mg total) by mouth 2 (two) times daily as needed for muscle spasms. 03/31/20   Hilts, Casimiro Needle, MD  cetirizine (ZYRTEC ALLERGY) 10 MG tablet Take 1 tablet (10 mg total) by mouth daily. 12/06/20   Hilts, Casimiro Needle, MD  Cholecalciferol (VITAMIN D) 125 MCG (5000 UT) CAPS Take 125 mcg by mouth daily. 02/02/20   Noni Saupe, MD  Semaglutide-Weight Management [facility]) 0.25 MG/0.5ML SOAJ Inject 0.25 mg into the skin once a week. 09/08/22   Elenore Paddy, NP  SYNTHROID 200 MCG tablet TAKE 1 TABLET BY MOUTH ONCE DAILY BEFORE BREAKFAST 07/21/20   Hilts, Casimiro Needle, MD  triamterene-hydrochlorothiazide (MAXZIDE-25) 37.5-25 MG tablet TAKE 1 TABLET ONE TIME DAILY FOR BLOOD PRESSURE 09/29/20   Hilts, Casimiro Needle, MD  UNABLE TO FIND Brooke Dare BLOOD SUGAR FOCUS JJ LIVER  FOCUS BLUE SKY D3 WITH K2    [provider]  vitamin B-12 (CYANOCOBALAMIN) 1000 MCG tablet Take 1,000 mcg by mouth daily.    [provider]  Zinc 100 MG TABS Take 1 tab daily 02/02/20   Lezlie Lye, Meda Coffee, MD    Family History Family History  Problem Relation Age of Onset   Diabetes Mother    Hypertension Mother    Coronary artery disease Father 26       heart disease diagnoised   Hypertension Father    Heart disease Father    Colon cancer Father        unknown age   Diabetes Father    Hyperlipidemia Father    Arthritis Sister    Hypertension Sister    Diabetes Sister    Colon polyps Sister    Cervical cancer Sister    Other Sister        uterine fibroids   Hypertension Sister    Diabetes Sister    Colon polyps Sister    Colon polyps Brother    Colon cancer Maternal Grandmother    Ovarian cancer Maternal Grandmother    Dislocations Maternal Grandmother    Cirrhosis Maternal Grandfather    Heart disease Paternal Grandmother    Coronary artery disease Paternal Grandmother    Cancer Paternal Grandmother    Colon cancer Other        great uncle at 81, great aunt dx at 63, GM age 42   Colon polyps Other        2 sisters dx at age 1 and 59   Breast cancer Neg Hx    Stroke Neg Hx     Social History Social History   Tobacco Use   Smoking status: Never   Smokeless tobacco: Never  Vaping Use   Vaping status: Never Used  Substance Use Topics   Alcohol use: Yes    Comment: glass of wine once a month   Drug use: No     Allergies   Penicillins and Felodipine   Review of Systems Review of Systems  Constitutional:  Negative for chills and fever.  HENT:  Negative for congestion and sore throat.   Eyes:  Negative for discharge and redness.  Respiratory:  Negative for cough and shortness of breath.   Gastrointestinal:  Negative for abdominal pain, nausea and vomiting.     Physical Exam Triage Vital Signs ED Triage Vitals [12/12/22 1511]   Encounter Vitals Group     BP (!) 155/82     Systolic BP Percentile      Diastolic BP Percentile  Pulse Rate 91     Resp 18     Temp 98.2 F (36.8 C)     Temp Source Oral     SpO2 97 %     Weight      Height      Head Circumference      Peak Flow      Pain Score 0     Pain Loc      Pain Education      Exclude from Growth Chart    No data found.  Updated Vital Signs BP (!) 155/82 (BP Location: Left Arm)   Pulse 91   Temp 98.2 F (36.8 C) (Oral)   Resp 18   LMP 05/31/2018 (Exact Date)   SpO2 97%   Visual Acuity Right Eye Distance:   Left Eye Distance:   Bilateral Distance:    Right Eye Near:   Left Eye Near:    Bilateral Near:     Physical Exam Vitals and nursing note reviewed.  Constitutional:      General: She is not in acute distress.    Appearance: Normal appearance. She is not ill-appearing.  HENT:     Head: Normocephalic and atraumatic.  Eyes:     Conjunctiva/sclera: Conjunctivae normal.  Cardiovascular:     Rate and Rhythm: Normal rate.  Pulmonary:     Effort: Pulmonary effort is normal. No respiratory distress.  Neurological:     Mental Status: She is alert.  Psychiatric:        Mood and Affect: Mood normal.        Behavior: Behavior normal.        Thought Content: Thought content normal.      UC Treatments / Results  Labs (all labs ordered are listed, but only abnormal results are displayed) Labs Reviewed  SARS CORONAVIRUS 2 (TAT 6-24 HRS)    EKG   Radiology No results found.  Procedures Procedures (including critical care time)  Medications Ordered in UC Medications - No data to display  Initial Impression / Assessment and Plan / UC Course  I have reviewed the triage vital signs and the nursing notes.  Pertinent labs & imaging results that were available during my care of the patient were reviewed by me and considered in my medical decision making (see chart for details).    COVID screening ordered.  Will await  results further recommendation.  Encouraged follow-up with any further concerns.  Final Clinical Impressions(s) / UC Diagnoses   Final diagnoses:  Exposure to COVID-19 virus   Discharge Instructions   None    ED Prescriptions   None    PDMP not reviewed this encounter.   Tomi Bamberger, PA-C 12/12/22 1624

## 2023-01-10 DIAGNOSIS — J302 Other seasonal allergic rhinitis: Secondary | ICD-10-CM | POA: Diagnosis not present

## 2023-01-10 DIAGNOSIS — E039 Hypothyroidism, unspecified: Secondary | ICD-10-CM | POA: Diagnosis not present

## 2023-01-10 DIAGNOSIS — F418 Other specified anxiety disorders: Secondary | ICD-10-CM | POA: Diagnosis not present

## 2023-01-10 DIAGNOSIS — G8929 Other chronic pain: Secondary | ICD-10-CM | POA: Diagnosis not present

## 2023-01-10 DIAGNOSIS — I10 Essential (primary) hypertension: Secondary | ICD-10-CM | POA: Diagnosis not present

## 2023-01-10 DIAGNOSIS — G4733 Obstructive sleep apnea (adult) (pediatric): Secondary | ICD-10-CM | POA: Diagnosis not present

## 2023-01-10 DIAGNOSIS — Z6838 Body mass index (BMI) 38.0-38.9, adult: Secondary | ICD-10-CM | POA: Diagnosis not present

## 2023-01-11 DIAGNOSIS — I10 Essential (primary) hypertension: Secondary | ICD-10-CM | POA: Diagnosis not present

## 2023-01-30 ENCOUNTER — Telehealth: Payer: Self-pay | Admitting: Sports Medicine

## 2023-01-30 NOTE — Telephone Encounter (Signed)
Pt called requesting a call from Dr Shon Baton about her pains. Pt also asking for refill of bacofen and 800 mg ibprofen. Please sen to [facility] on [address] Pt phone number is [phone].

## 2023-01-31 ENCOUNTER — Other Ambulatory Visit: Payer: Self-pay | Admitting: Sports Medicine

## 2023-01-31 ENCOUNTER — Other Ambulatory Visit: Payer: Self-pay | Admitting: Surgical

## 2023-01-31 ENCOUNTER — Telehealth: Payer: Self-pay | Admitting: Orthopedic Surgery

## 2023-01-31 MED ORDER — BACLOFEN 10 MG PO TABS: 5.0000 mg | ORAL_TABLET | Freq: Two times a day (BID) | ORAL | 11 refills | Status: DC | PRN

## 2023-01-31 MED ORDER — IBUPROFEN 600 MG PO TABS: 600.0000 mg | ORAL_TABLET | Freq: Three times a day (TID) | ORAL | 2 refills | Status: DC | PRN

## 2023-01-31 MED ORDER — IBUPROFEN 800 MG PO TABS: 800.0000 mg | ORAL_TABLET | Freq: Three times a day (TID) | ORAL | 0 refills | Status: DC | PRN

## 2023-01-31 NOTE — Telephone Encounter (Signed)
Pt called requesting a call from Dr Shon Baton about her pains. Pt also asking for refill of bacofen and 800 mg ibprofen. Please sen to [facility] on [address] Pt phone number is [phone].

## 2023-02-08 ENCOUNTER — Telehealth: Payer: Self-pay | Admitting: Orthopedic Surgery

## 2023-02-08 NOTE — Telephone Encounter (Signed)
Patient called. She would like gel injections in her knees. Her call back number is [phone]

## 2023-02-12 ENCOUNTER — Ambulatory Visit: Payer: Medicare HMO | Admitting: Sports Medicine

## 2023-02-14 ENCOUNTER — Other Ambulatory Visit: Payer: Self-pay

## 2023-02-14 DIAGNOSIS — M1712 Unilateral primary osteoarthritis, left knee: Secondary | ICD-10-CM

## 2023-02-14 DIAGNOSIS — M1711 Unilateral primary osteoarthritis, right knee: Secondary | ICD-10-CM

## 2023-02-14 NOTE — Telephone Encounter (Signed)
Previously submitted.   Talked with patient and appointment has been scheduled for gel injection with Dr. August Saucer only.

## 2023-02-23 ENCOUNTER — Encounter: Payer: Self-pay | Admitting: Obstetrics and Gynecology

## 2023-02-23 ENCOUNTER — Other Ambulatory Visit (HOSPITAL_COMMUNITY)
Admission: RE | Admit: 2023-02-23 | Discharge: 2023-02-23 | Disposition: A | Discharge status: Home / Self Care | Payer: Medicare HMO | Source: Ambulatory Visit | Attending: Obstetrics and Gynecology | Admitting: Obstetrics and Gynecology

## 2023-02-23 ENCOUNTER — Ambulatory Visit (INDEPENDENT_AMBULATORY_CARE_PROVIDER_SITE_OTHER): Payer: Medicare HMO | Admitting: Obstetrics and Gynecology

## 2023-02-23 VITALS — BP 168/78 | HR 81 | Ht 67.0 in | Wt 247.0 lb

## 2023-02-23 DIAGNOSIS — Z113 Encounter for screening for infections with a predominantly sexual mode of transmission: Secondary | ICD-10-CM | POA: Insufficient documentation

## 2023-02-23 DIAGNOSIS — Z01419 Encounter for gynecological examination (general) (routine) without abnormal findings: Secondary | ICD-10-CM | POA: Diagnosis not present

## 2023-02-23 DIAGNOSIS — D259 Leiomyoma of uterus, unspecified: Secondary | ICD-10-CM | POA: Diagnosis not present

## 2023-02-23 DIAGNOSIS — Z0000 Encounter for general adult medical examination without abnormal findings: Secondary | ICD-10-CM

## 2023-02-23 DIAGNOSIS — Z1151 Encounter for screening for human papillomavirus (HPV): Secondary | ICD-10-CM | POA: Diagnosis not present

## 2023-02-23 DIAGNOSIS — Z1339 Encounter for screening examination for other mental health and behavioral disorders: Secondary | ICD-10-CM

## 2023-02-23 MED ORDER — NYSTATIN-TRIAMCINOLONE 100000-0.1 UNIT/GM-% EX OINT: 1.0000 | TOPICAL_OINTMENT | Freq: Two times a day (BID) | CUTANEOUS | 0 refills | Status: DC

## 2023-02-23 NOTE — Progress Notes (Signed)
Subjective:     Kathleen Mcfarland is a 62 y.o. P2 postmenopausal with BMI 38 who is here for a comprehensive physical exam. The patient reports no problems. Patient has been sexually active a year ago. She denies pelvic pain or abnormal discharge. She denies any episodes of vaginal bleeding. She denies urinary incontinence. She reports regular bowel movements. She reports vasomotor symptoms and is not interested in HRT. Patient reports a history of fibroid uterus and desires to follow up on them. Patient is without any other complaints  Past Medical History:  Diagnosis Date   Allergic rhinitis    Anxiety    Back pain    Cataract    Complication of anesthesia    hard to wake up   Depression    Hypertension    Hypothyroidism    Osteoarthritis 2012   bilateral knees   Osteoarthritis of knee    Osteoporosis    Past Surgical History:  Procedure Laterality Date   COLONOSCOPY  09/05/2016   Dr.Jacobs   DILATION AND CURETTAGE OF UTERUS     ROTATOR CUFF REPAIR     SHOULDER ARTHROSCOPY  04/13/2011   Procedure: ARTHROSCOPY SHOULDER;  Surgeon: Loreta Ave, MD;  Location: [facility];  Service: Orthopedics;  Laterality: Right;  Right Shoulder Arthroscopy with Acrmioploasty, Arhtroscopic rotator cuff repair and Microfracture humeral head   SHOULDER ARTHROSCOPY WITH ROTATOR CUFF REPAIR Right 07/04/2012   Procedure: SHOULDER ARTHROSCOPY WITH ROTATOR CUFF REPAIR;  Surgeon: Loreta Ave, MD;  Location: [facility];  Service: Orthopedics;  Laterality: Right;  RIGHT SHOULDER ARTHROSCOPY WITH ARTHROSCOPIC ROTATOR CUFF REPAIR, LYSIS OF ADHESIONS   TUBAL LIGATION     Family History  Problem Relation Age of Onset   Diabetes Mother    Hypertension Mother    Coronary artery disease Father 47       heart disease diagnoised   Hypertension Father    Heart disease Father    Colon cancer Father        unknown age   Diabetes Father    Hyperlipidemia Father     Arthritis Sister    Hypertension Sister    Diabetes Sister    Colon polyps Sister    Cervical cancer Sister    Other Sister        uterine fibroids   Hypertension Sister    Diabetes Sister    Colon polyps Sister    Colon polyps Brother    Colon cancer Maternal Grandmother    Ovarian cancer Maternal Grandmother    Dislocations Maternal Grandmother    Cirrhosis Maternal Grandfather    Heart disease Paternal Grandmother    Coronary artery disease Paternal Grandmother    Cancer Paternal Grandmother    Colon cancer Other        great uncle at 93, great aunt dx at 46, GM age 29   Colon polyps Other        2 sisters dx at age 97 and 35   Breast cancer Neg Hx    Stroke Neg Hx     Social History   Socioeconomic History   Marital status: Single    Spouse name: Not on file   Number of children: 2   Years of education: Not on file   Highest education level: Not on file  Occupational History   Occupation: None  Tobacco Use   Smoking status: Never   Smokeless tobacco: Never  Vaping Use   Vaping status: Never Used  Substance and Sexual Activity   Alcohol use: Yes    Comment: glass of wine once a month   Drug use: No   Sexual activity: Yes    Birth control/protection: Other-see comments    Comment: BTL  Other Topics Concern   Not on file  Social History Narrative   Regular Exercise- yes 3/week   Caffeine- no   Social Determinants of Health   Financial Resource Strain: Not on file  Food Insecurity: Not on file  Transportation Needs: Not on file  Physical Activity: Not on file  Stress: Not on file  Social Connections: Not on file  Intimate Partner Violence: Not on file   Health Maintenance  Topic Date Due   Zoster Vaccines- Shingrix (1 of 2) Never done   DTaP/Tdap/Td (4 - Td or Tdap) 05/01/2016   INFLUENZA VACCINE  11/30/2022   COVID-19 Vaccine (1 - 2023-24 season) Never done   Medicare Annual Wellness (AWV)  09/29/2023   MAMMOGRAM  10/01/2024   Cervical Cancer  Screening (HPV/Pap Cotest)  01/19/2025   Colonoscopy  12/31/2026   Hepatitis C Screening  Completed   HIV Screening  Completed   HPV VACCINES  Aged Out      Review of Systems Pertinent items noted in HPI and remainder of comprehensive ROS otherwise negative.   Objective:  Blood pressure (!) 168/78, pulse 81, height 5\' 7"  (1.702 m), weight 247 lb (112 kg), last menstrual period 05/31/2018.   GENERAL: Well-developed, well-nourished female in no acute distress.  HEENT: Normocephalic, atraumatic. Sclerae anicteric.  NECK: Supple. Normal thyroid.  LUNGS: Clear to auscultation bilaterally.  HEART: Regular rate and rhythm. BREASTS: Symmetric in size. No palpable masses or lymphadenopathy, skin changes, or nipple drainage. ABDOMEN: Soft, nontender, nondistended. No organomegaly. PELVIC: Normal external female genitalia. Vagina is pink and rugated.  Normal discharge. Normal appearing cervix. Limited pelvic exam secondary to body habitus. Chaperone present during the pelvic exam EXTREMITIES: No cyanosis, clubbing, or edema, 2+ distal pulses.     Assessment:    Healthy female exam.      Plan:    Pap smear collected Patient current on mammogram and colonoscopy Patient desires STI screening Pelvic ultrasound ordered Patient will be contacted with abnormal results See After Visit Summary for Counseling Recommendations

## 2023-02-23 NOTE — Progress Notes (Signed)
62 y.o. GYN presents for AEX.  Pt wants to get her PAP smear done today.

## 2023-02-24 LAB — HIV ANTIBODY (ROUTINE TESTING W REFLEX): HIV Screen 4th Generation wRfx: NONREACTIVE

## 2023-02-24 LAB — HEPATITIS B SURFACE ANTIGEN: Hepatitis B Surface Ag: NEGATIVE

## 2023-02-24 LAB — RPR: RPR Ser Ql: NONREACTIVE

## 2023-02-24 LAB — HEPATITIS C ANTIBODY: Hep C Virus Ab: NONREACTIVE

## 2023-02-26 ENCOUNTER — Ambulatory Visit (HOSPITAL_BASED_OUTPATIENT_CLINIC_OR_DEPARTMENT_OTHER)
Admission: RE | Admit: 2023-02-26 | Discharge: 2023-02-26 | Disposition: A | Discharge status: Home / Self Care | Payer: Medicare HMO | Source: Ambulatory Visit | Attending: Obstetrics and Gynecology | Admitting: Obstetrics and Gynecology

## 2023-02-26 DIAGNOSIS — Z780 Asymptomatic menopausal state: Secondary | ICD-10-CM | POA: Diagnosis not present

## 2023-02-26 DIAGNOSIS — D259 Leiomyoma of uterus, unspecified: Secondary | ICD-10-CM

## 2023-02-26 DIAGNOSIS — N854 Malposition of uterus: Secondary | ICD-10-CM | POA: Diagnosis not present

## 2023-02-27 LAB — CYTOLOGY - PAP
Adequacy: ABSENT
Chlamydia: NEGATIVE
Comment: NEGATIVE
Comment: NEGATIVE
Comment: NORMAL
Diagnosis: NEGATIVE
High risk HPV: NEGATIVE
Neisseria Gonorrhea: NEGATIVE

## 2023-03-01 ENCOUNTER — Ambulatory Visit: Payer: Medicare HMO | Admitting: Sports Medicine

## 2023-03-01 DIAGNOSIS — M25572 Pain in left ankle and joints of left foot: Secondary | ICD-10-CM | POA: Diagnosis not present

## 2023-03-01 DIAGNOSIS — M7752 Other enthesopathy of left foot: Secondary | ICD-10-CM

## 2023-03-01 DIAGNOSIS — Q666 Other congenital valgus deformities of feet: Secondary | ICD-10-CM | POA: Diagnosis not present

## 2023-03-01 DIAGNOSIS — M19072 Primary osteoarthritis, left ankle and foot: Secondary | ICD-10-CM | POA: Diagnosis not present

## 2023-03-01 MED ORDER — METHYLPREDNISOLONE 4 MG PO TBPK: ORAL_TABLET | ORAL | 0 refills | Status: DC

## 2023-03-01 NOTE — Progress Notes (Addendum)
Kathleen Mcfarland - 62 y.o. female MRN [id number]  Date of birth: 1960-11-06  Office Visit Note: Visit Date: 03/01/2023 PCP: Pcp, No Referred by: No ref. provider found  Subjective: Chief Complaint  Patient presents with   Left Ankle - Pain   HPI: Kathleen Mcfarland is a pleasant 62 y.o. female who presents today for chronic left ankle pain and swelling.  Kathleen Mcfarland has had chronic left foot and ankle pain but here recently she has had an exacerbation more so over the posterior heel.  She notes swelling associated with this as well.  Walking has become difficult because of this.  She does have known arthritis as well as calcaneal and Achilles enthesophytes.  We have performed 1 treatment of extracorporeal shockwave therapy in the past with mild improvement.  Has used over-the-counter anti-inflammatories without much relief.  She has been elevating and icing.  She is pre-diabetic. Lab Results  Component Value Date   HGBA1C 6.3 09/08/2022   Pertinent ROS were reviewed with the patient and found to be negative unless otherwise specified above in HPI.   Assessment & Plan: Visit Diagnoses:  1. Retrocalcaneal bursitis (back of heel), left   2. Primary osteoarthritis, left ankle and foot   3. Pain in left ankle and joints of left foot   4. Pes planovalgus    Plan: Impression is exacerbation of chronic left ankle and foot pain, which I believe is an exacerbation of her underlying osteoarthritis of the ankle with concomitant acute retrocalcaneal bursitis.  She does have some swelling over the retrocalcaneal bursa region and this is tender to palpation.  It is likely that her pes planovalgus is contributing to both of the above.  We will start her on a 6-day methylprednisolone taper to help with inflammation and swelling.  Given the degree of her pain, we will place her into a short cam walker boot for the next 10 to 14 days.  I would like to see her back in about 2 weeks for reevaluation.    Additional treatment considerations: Orthotics versus shoe inserts was had to help correct her hindfoot valgus. May consider formalized physical therapy as well. Few trials of extracorporeal shockwave treatment therapy.  Follow-up: Return in about 2 weeks (around 03/15/2023) for L-heel/ankle.   Meds & Orders:  Meds ordered this encounter  Medications   methylPREDNISolone (MEDROL DOSEPAK) 4 MG TBPK tablet    Sig: Take per packet instructions. Taper dosing.    Dispense:  1 each    Refill:  0   No orders of the defined types were placed in this encounter.    Procedures: No procedures performed      Clinical History: Prefers Dr. August Saucer for gel Injection  She reports that she has never smoked. She has never used smokeless tobacco.  Recent Labs    09/08/22 0928  HGBA1C 6.3    Objective:   Vital Signs: LMP 05/31/2018 (Exact Date)   Physical Exam  Gen: Well-appearing, in no acute distress; non-toxic CV: Well-perfused. Warm.  Resp: Breathing unlabored on room air; no wheezing. Psych: Fluid speech in conversation; appropriate affect; normal thought process Neuro: Sensation intact throughout. No gross coordination deficits.   Ortho Exam - Left ankle/foot: There is some swelling and mild warmth without redness or cellulitic change around the retrocalcaneal bursa.  She has tenderness to palpation here.  There is some limitation with motion in dorsiflexion and plantarflexion about the ankle.  Trace to 1+ edema around ankle.  There is significant  pes planovalgus with standing.  Imaging:  XR ankle 05/08/22:Three-view radiographs of the left ankle shows bony spurs at the  talonavicular joint and midfoot.  There is a calcaneal spur and  calcification at the insertion of the Achilles with a Haglund's deformity.   Ankle shows medial joint space narrowing with osteophytic bone spurs  medially.    *Independent review and interpretation of 3 views of the left ankle including AP, oblique and  lateral film was personally reviewed by myself today.  X-rays demonstrate enthesophytes with moderate spurring off the superior aspect of the calcaneus with a small Haglund's deformity.  There is a small plantar calcaneal spur.  There is mild to moderate osteoarthritic change at the talonavicular and cuneiform junction of the midfoot.  No acute fracture noted.  Past Medical/Family/Surgical/Social History: Medications & Allergies reviewed per EMR, new medications updated. Patient Active Problem List   Diagnosis Date Noted   Bouchard's node 09/08/2022   Hypertensive disorder 05/10/2021   Lymphadenopathy 03/11/2020   Neck mass 03/11/2020   Trigger finger 02/18/2020   Trigger thumb of right hand 01/15/2020   Vitamin D deficiency 08/08/2019   Bilateral lower extremity edema 08/08/2019   Pre-diabetes 06/04/2017   Menopausal syndrome (hot flashes) 06/04/2017   History of colonic polyps 06/04/2017   Chronic use of opiate for therapeutic purpose 07/03/2016   Disorder of rotator cuff of both shoulders 05/19/2015   Obesity (BMI 30-39.9) 05/19/2015   Severe recurrent major depressive disorder with psychotic features (HCC) 01/03/2014   Depression 01/02/2014   Urinary incontinence 07/21/2010   HYPERTENSION, BENIGN ESSENTIAL 07/31/2006   Hypothyroidism (acquired) 06/23/2006   Seasonal allergies 06/23/2006   Osteoarthrosis involving lower leg 06/23/2006   Past Medical History:  Diagnosis Date   Allergic rhinitis    Anxiety    Back pain    Cataract    Complication of anesthesia    hard to wake up   Depression    Hypertension    Hypothyroidism    Osteoarthritis 2012   bilateral knees   Osteoarthritis of knee    Osteoporosis    Family History  Problem Relation Age of Onset   Diabetes Mother    Hypertension Mother    Coronary artery disease Father 46       heart disease diagnoised   Hypertension Father    Heart disease Father    Colon cancer Father        unknown age   Diabetes  Father    Hyperlipidemia Father    Arthritis Sister    Hypertension Sister    Diabetes Sister    Colon polyps Sister    Cervical cancer Sister    Other Sister        uterine fibroids   Hypertension Sister    Diabetes Sister    Colon polyps Sister    Colon polyps Brother    Colon cancer Maternal Grandmother    Ovarian cancer Maternal Grandmother    Dislocations Maternal Grandmother    Cirrhosis Maternal Grandfather    Heart disease Paternal Grandmother    Coronary artery disease Paternal Grandmother    Cancer Paternal Grandmother    Colon cancer Other        great uncle at 39, great aunt dx at 66, GM age 62   Colon polyps Other        2 sisters dx at age 38 and 59   Breast cancer Neg Hx    Stroke Neg Hx    Past Surgical History:  Procedure Laterality Date   COLONOSCOPY  09/05/2016   Dr.Jacobs   DILATION AND CURETTAGE OF UTERUS     ROTATOR CUFF REPAIR     SHOULDER ARTHROSCOPY  04/13/2011   Procedure: ARTHROSCOPY SHOULDER;  Surgeon: Loreta Ave, MD;  Location: [facility];  Service: Orthopedics;  Laterality: Right;  Right Shoulder Arthroscopy with Acrmioploasty, Arhtroscopic rotator cuff repair and Microfracture humeral head   SHOULDER ARTHROSCOPY WITH ROTATOR CUFF REPAIR Right 07/04/2012   Procedure: SHOULDER ARTHROSCOPY WITH ROTATOR CUFF REPAIR;  Surgeon: Loreta Ave, MD;  Location: [facility];  Service: Orthopedics;  Laterality: Right;  RIGHT SHOULDER ARTHROSCOPY WITH ARTHROSCOPIC ROTATOR CUFF REPAIR, LYSIS OF ADHESIONS   TUBAL LIGATION     Social History   Occupational History   Occupation: None  Tobacco Use   Smoking status: Never   Smokeless tobacco: Never  Vaping Use   Vaping status: Never Used  Substance and Sexual Activity   Alcohol use: Yes    Comment: glass of wine once a month   Drug use: No   Sexual activity: Yes    Birth control/protection: Other-see comments    Comment: BTL

## 2023-03-05 ENCOUNTER — Telehealth: Payer: Self-pay

## 2023-03-05 ENCOUNTER — Ambulatory Visit: Payer: Medicare HMO | Admitting: Orthopedic Surgery

## 2023-03-05 DIAGNOSIS — M1711 Unilateral primary osteoarthritis, right knee: Secondary | ICD-10-CM

## 2023-03-05 DIAGNOSIS — M1712 Unilateral primary osteoarthritis, left knee: Secondary | ICD-10-CM

## 2023-03-05 DIAGNOSIS — M170 Bilateral primary osteoarthritis of knee: Secondary | ICD-10-CM | POA: Diagnosis not present

## 2023-03-05 NOTE — Telephone Encounter (Signed)
Noted. Next available gel injection will need to be after 09/02/2023. Will submit in Jobie Popp, 2025.

## 2023-03-05 NOTE — Telephone Encounter (Signed)
Auth needed for bilat knee gel in 6 months

## 2023-03-07 ENCOUNTER — Encounter: Payer: Self-pay | Admitting: Obstetrics and Gynecology

## 2023-03-07 ENCOUNTER — Encounter: Payer: Self-pay | Admitting: Orthopedic Surgery

## 2023-03-07 DIAGNOSIS — M170 Bilateral primary osteoarthritis of knee: Secondary | ICD-10-CM

## 2023-03-07 MED ORDER — LIDOCAINE HCL 1 % IJ SOLN
5.0000 mL | INTRAMUSCULAR | Status: AC | PRN
  Administered 2023-03-07: 5 mL

## 2023-03-07 MED ORDER — HYALURONAN 88 MG/4ML IX SOSY
88.0000 mg | PREFILLED_SYRINGE | INTRA_ARTICULAR | Status: AC | PRN
  Administered 2023-03-07: 88 mg via INTRA_ARTICULAR

## 2023-03-07 NOTE — Progress Notes (Signed)
   Procedure Note  Patient: Kathleen Mcfarland             Date of Birth: [birth year]           MRN: [id number]             Visit Date: 03/05/2023  Procedures: Visit Diagnoses:  1. Unilateral primary osteoarthritis, left knee   2. Unilateral primary osteoarthritis, right knee     Large Joint Inj: bilateral knee on 03/07/2023 6:48 AM Indications: diagnostic evaluation, joint swelling and pain Details: 18 G 1.5 in needle, superolateral approach  Arthrogram: No  Medications (Right): 5 mL lidocaine 1 %; 88 mg Hyaluronan 88 MG/4ML Medications (Left): 5 mL lidocaine 1 %; 88 mg Hyaluronan 88 MG/4ML Outcome: tolerated well, no immediate complications Procedure, treatment alternatives, risks and benefits explained, specific risks discussed. Consent was given by the patient. Immediately prior to procedure a time out was called to verify the correct patient, procedure, equipment, support staff and site/side marked as required. Patient was prepped and draped in the usual sterile fashion.     Lot #[id number] Patient presents for bilateral knee Monovisc injections.  She also is in the fracture boot.  Having some Achilles tendon problems.  Asking about compression socks.  I think she should go knee-high at 15 to 20 mm of compression which will give her some compression but not so uncomfortably that she would be less likely to use them.   This patient is diagnosed with osteoarthritis of the knee(s).    Radiographs show evidence of joint space narrowing, osteophytes, subchondral sclerosis and/or subchondral cysts.  This patient has knee pain which interferes with functional and activities of daily living.    This patient has experienced inadequate response, adverse effects and/or intolerance with conservative treatments such as acetaminophen, NSAIDS, topical creams, physical therapy or regular exercise, knee bracing and/or weight loss.   This patient has experienced inadequate response or has a  contraindication to intra articular steroid injections for at least 3 months.   This patient is not scheduled to have a total knee replacement within 6 months of starting treatment with viscosupplementation.

## 2023-03-15 ENCOUNTER — Ambulatory Visit: Payer: Medicare HMO | Admitting: Sports Medicine

## 2023-03-15 ENCOUNTER — Encounter: Payer: Self-pay | Admitting: Sports Medicine

## 2023-03-15 DIAGNOSIS — M25572 Pain in left ankle and joints of left foot: Secondary | ICD-10-CM | POA: Diagnosis not present

## 2023-03-15 DIAGNOSIS — M7732 Calcaneal spur, left foot: Secondary | ICD-10-CM

## 2023-03-15 DIAGNOSIS — M7752 Other enthesopathy of left foot: Secondary | ICD-10-CM

## 2023-03-15 DIAGNOSIS — M7662 Achilles tendinitis, left leg: Secondary | ICD-10-CM | POA: Diagnosis not present

## 2023-03-15 MED ORDER — CELECOXIB 100 MG PO CAPS: 100.0000 mg | ORAL_CAPSULE | Freq: Two times a day (BID) | ORAL | 0 refills | Status: AC

## 2023-03-15 NOTE — Progress Notes (Signed)
Kathleen Mcfarland - 62 y.o. female MRN [id number]  Date of birth: [birth year]  Office Visit Note: Visit Date: 03/15/2023 PCP: Pcp, No Referred by: No ref. provider found  Subjective: Chief Complaint  Patient presents with   Left Foot - Follow-up   HPI: Kathleen Mcfarland is a pleasant 62 y.o. female who presents today for chronic left heel and ankle pain with swelling.  Kathleen Mcfarland is doing better with her heel as well as ankle and foot pain, although certainly is not resolved.  She has been immobilized in the cam walker.  He did notice good improvement in her swelling with a 6-day course of methylprednisolone.  Does not have as much pain within the ankle but pain continues to be localized over the superior calcaneus/heel.  She has not done formal physical therapy for this.  She does use ibuprofen occasionally.  Pertinent ROS were reviewed with the patient and found to be negative unless otherwise specified above in HPI.   Assessment & Plan: Visit Diagnoses:  1. Retrocalcaneal bursitis (back of heel), left   2. Achilles tendinitis of left lower extremity   3. Pain in left ankle and joints of left foot   4. Heel spur, left    Plan: Impression is chronic heel and foot/ankle pain which is multifactorial in nature with a calcaneal spurring and distal achilles enthesophytes.  She also has a degree of retrocalcaneal bursitis which is symptomatic for her.  She does have pes planovalgus as well as some arthritic change throughout the ankle and midfoot.  We discussed orthotic support, she would like to think about this as these are usually not covered through insurance.  She has completed her methylprednisolone, to help with her swelling of the bursitis and pain I would like to start her on Celebrex 100 mg to be taken once to twice daily as needed.  Will start her in formalized physical therapy for her heel pain with distal Achilles enthesophytes and retrocalcaneal bursitis.  I would like to see  her back in about 5 to 6 weeks after she starts therapy for about 1 month for reevaluation.  - Additional treatment considerations: 1-time trial of RC-bursa injection, orthotic fitting. She did not get significant relief from ECSWT. Referral to Dr. Lajoyce Corners for surgical eval.  Follow-up: Return for f/u about 5-6 weeks (one month after starting PT) L-heel and ankle.   Meds & Orders: No orders of the defined types were placed in this encounter.   Orders Placed This Encounter  Procedures   Ambulatory referral to Physical Therapy     Procedures: No procedures performed      Clinical History: Prefers Dr. August Saucer for gel Injection  She reports that she has never smoked. She has never used smokeless tobacco.  Recent Labs    09/08/22 0928  HGBA1C 6.3    Objective:   Vital Signs: LMP 05/31/2018 (Exact Date)   Physical Exam  Gen: Well-appearing, in no acute distress; non-toxic CV:  Well-perfused. Warm.  Resp: Breathing unlabored on room air; no wheezing. Psych: Fluid speech in conversation; appropriate affect; normal thought process Neuro: Sensation intact throughout. No gross coordination deficits.   Ortho Exam - Left ankle/foot: Positive TTP over the superior calcaneus and retrocalcaneal bursa region, there is a small amount of swelling here although improved edema throughout the foot compared to previous visits.  There is significant pes planovalgus with standing.  There is some limitation with dorsiflexion and plantarflexion about the ankle.  No ankle effusion.  Imaging:  05/08/22: Three-view radiographs of the left ankle shows bony spurs at the  talonavicular joint and midfoot.  There is a calcaneal spur and  calcification at the insertion of the Achilles with a Haglund's deformity.   Ankle shows medial joint space narrowing with osteophytic bone spurs  medially.   Past Medical/Family/Surgical/Social History: Medications & Allergies reviewed per EMR, new medications  updated. Patient Active Problem List   Diagnosis Date Noted   Bouchard's node 09/08/2022   Hypertensive disorder 05/10/2021   Lymphadenopathy 03/11/2020   Neck mass 03/11/2020   Trigger finger 02/18/2020   Trigger thumb of right hand 01/15/2020   Vitamin D deficiency 08/08/2019   Bilateral lower extremity edema 08/08/2019   Pre-diabetes 06/04/2017   Menopausal syndrome (hot flashes) 06/04/2017   History of colonic polyps 06/04/2017   Chronic use of opiate for therapeutic purpose 07/03/2016   Disorder of rotator cuff of both shoulders 05/19/2015   Obesity (BMI 30-39.9) 05/19/2015   Severe recurrent major depressive disorder with psychotic features (HCC) 01/03/2014   Depression 01/02/2014   Urinary incontinence 07/21/2010   HYPERTENSION, BENIGN ESSENTIAL 07/31/2006   Hypothyroidism (acquired) 06/23/2006   Seasonal allergies 06/23/2006   Osteoarthrosis involving lower leg 06/23/2006   Past Medical History:  Diagnosis Date   Allergic rhinitis    Anxiety    Back pain    Cataract    Complication of anesthesia    hard to wake up   Depression    Hypertension    Hypothyroidism    Osteoarthritis 2012   bilateral knees   Osteoarthritis of knee    Osteoporosis    Family History  Problem Relation Age of Onset   Diabetes Mother    Hypertension Mother    Coronary artery disease Father 73       heart disease diagnoised   Hypertension Father    Heart disease Father    Colon cancer Father        unknown age   Diabetes Father    Hyperlipidemia Father    Arthritis Sister    Hypertension Sister    Diabetes Sister    Colon polyps Sister    Cervical cancer Sister    Other Sister        uterine fibroids   Hypertension Sister    Diabetes Sister    Colon polyps Sister    Colon polyps Brother    Colon cancer Maternal Grandmother    Ovarian cancer Maternal Grandmother    Dislocations Maternal Grandmother    Cirrhosis Maternal Grandfather    Heart disease Paternal Grandmother     Coronary artery disease Paternal Grandmother    Cancer Paternal Grandmother    Colon cancer Other        great uncle at 62, great aunt dx at 25, GM age 33   Colon polyps Other        2 sisters dx at age 36 and 16   Breast cancer Neg Hx    Stroke Neg Hx    Past Surgical History:  Procedure Laterality Date   COLONOSCOPY  09/05/2016   Dr.Jacobs   DILATION AND CURETTAGE OF UTERUS     ROTATOR CUFF REPAIR     SHOULDER ARTHROSCOPY  04/13/2011   Procedure: ARTHROSCOPY SHOULDER;  Surgeon: Loreta Ave, MD;  Location: [facility];  Service: Orthopedics;  Laterality: Right;  Right Shoulder Arthroscopy with Acrmioploasty, Arhtroscopic rotator cuff repair and Microfracture humeral head   SHOULDER ARTHROSCOPY WITH ROTATOR CUFF REPAIR Right 07/04/2012  Procedure: SHOULDER ARTHROSCOPY WITH ROTATOR CUFF REPAIR;  Surgeon: Loreta Ave, MD;  Location: [facility];  Service: Orthopedics;  Laterality: Right;  RIGHT SHOULDER ARTHROSCOPY WITH ARTHROSCOPIC ROTATOR CUFF REPAIR, LYSIS OF ADHESIONS   TUBAL LIGATION     Social History   Occupational History   Occupation: None  Tobacco Use   Smoking status: Never   Smokeless tobacco: Never  Vaping Use   Vaping status: Never Used  Substance and Sexual Activity   Alcohol use: Yes    Comment: glass of wine once a month   Drug use: No   Sexual activity: Yes    Birth control/protection: Other-see comments    Comment: BTL

## 2023-03-15 NOTE — Progress Notes (Signed)
Patient says that she is feeling better. She says that the Prednisone did help as the swelling went down quite a bit. She says that now she is mostly feeling aching, but that overall it is better than it was previously.

## 2023-03-28 NOTE — Therapy (Incomplete)
OUTPATIENT PHYSICAL THERAPY  EVALUATION   Patient Name: Kathleen Mcfarland MRN: [id number] DOB:1961/03/23, 62 y.o., female Today's Date: 03/28/2023  END OF SESSION:   Past Medical History:  Diagnosis Date   Allergic rhinitis    Anxiety    Back pain    Cataract    Complication of anesthesia    hard to wake up   Depression    Hypertension    Hypothyroidism    Osteoarthritis 2012   bilateral knees   Osteoarthritis of knee    Osteoporosis    Past Surgical History:  Procedure Laterality Date   COLONOSCOPY  09/05/2016   Dr.Jacobs   DILATION AND CURETTAGE OF UTERUS     ROTATOR CUFF REPAIR     SHOULDER ARTHROSCOPY  04/13/2011   Procedure: ARTHROSCOPY SHOULDER;  Surgeon: Loreta Ave, MD;  Location: [facility];  Service: Orthopedics;  Laterality: Right;  Right Shoulder Arthroscopy with Acrmioploasty, Arhtroscopic rotator cuff repair and Microfracture humeral head   SHOULDER ARTHROSCOPY WITH ROTATOR CUFF REPAIR Right 07/04/2012   Procedure: SHOULDER ARTHROSCOPY WITH ROTATOR CUFF REPAIR;  Surgeon: Loreta Ave, MD;  Location: [facility];  Service: Orthopedics;  Laterality: Right;  RIGHT SHOULDER ARTHROSCOPY WITH ARTHROSCOPIC ROTATOR CUFF REPAIR, LYSIS OF ADHESIONS   TUBAL LIGATION     Patient Active Problem List   Diagnosis Date Noted   Bouchard's node 09/08/2022   Hypertensive disorder 05/10/2021   Lymphadenopathy 03/11/2020   Neck mass 03/11/2020   Trigger finger 02/18/2020   Trigger thumb of right hand 01/15/2020   Vitamin D deficiency 08/08/2019   Bilateral lower extremity edema 08/08/2019   Pre-diabetes 06/04/2017   Menopausal syndrome (hot flashes) 06/04/2017   History of colonic polyps 06/04/2017   Chronic use of opiate for therapeutic purpose 07/03/2016   Disorder of rotator cuff of both shoulders 05/19/2015   Obesity (BMI 30-39.9) 05/19/2015   Severe recurrent major depressive disorder with psychotic features (HCC) 01/03/2014    Depression 01/02/2014   Urinary incontinence 07/21/2010   HYPERTENSION, BENIGN ESSENTIAL 07/31/2006   Hypothyroidism (acquired) 06/23/2006   Seasonal allergies 06/23/2006   Osteoarthrosis involving lower leg 06/23/2006    PCP: Elenore Paddy NP  REFERRING PROVIDER: Madelyn Brunner, DO  REFERRING DIAG: [phone] (ICD-10-CM) - Retrocalcaneal bursitis (back of heel), left M76.62 (ICD-10-CM) - Achilles tendinitis of left lower extremity  THERAPY DIAG:  No diagnosis found.  Rationale for Evaluation and Treatment: Rehabilitation  ONSET DATE: ***  SUBJECTIVE:   SUBJECTIVE STATEMENT: ***  PERTINENT HISTORY: Med Hx:  anxiety, back pain, depression, HTN, OA knees, osteoporosis    PAIN:  NPRS scale: ***/10 Pain location: *** Pain description: *** Aggravating factors: *** Relieving factors: ***  PRECAUTIONS: {Therapy precautions:24002}  WEIGHT BEARING RESTRICTIONS: {Yes ***/No:24003}  FALLS:  Has patient fallen in last 6 months? {fallsyesno:27318}  LIVING ENVIRONMENT: Lives with: {OPRC lives with:25569::"lives with their family"} Lives in: {Lives in:25570} Stairs: {opstairs:27293} Has following equipment at home: {Assistive devices:23999}  OCCUPATION: ***  PLOF: Independent  PATIENT GOALS: ***  Next MD visit:   OBJECTIVE:   PATIENT SURVEYS:  04/02/2023 FOTO intake:    predicted:    COGNITION: 04/02/2023 Overall cognitive status: WFL    SENSATION: 04/02/2023 {sensation:27233}  EDEMA:  04/02/2023 {edema:24020}  MUSCLE LENGTH: 04/02/2023 Hamstrings: Right *** deg; Left *** deg [name] test: Right *** deg; Left *** deg  POSTURE:  04/02/2023 {posture:25561}  PALPATION: 04/02/2023 ***  LOWER EXTREMITY ROM:   ROM Right 04/02/2023 Left 04/02/2023  Hip flexion  Hip extension    Hip abduction    Hip adduction    Hip internal rotation    Hip external rotation    Knee flexion    Knee extension    Ankle dorsiflexion    Ankle plantarflexion     Ankle inversion    Ankle eversion     (Blank rows = not tested)  LOWER EXTREMITY MMT:  MMT Right 04/02/2023 Left 04/02/2023  Hip flexion    Hip extension    Hip abduction    Hip adduction    Hip internal rotation    Hip external rotation    Knee flexion    Knee extension    Ankle dorsiflexion    Ankle plantarflexion    Ankle inversion    Ankle eversion     (Blank rows = not tested)  LOWER EXTREMITY SPECIAL TESTS:  04/02/2023 {LEspecialtests:26242}  FUNCTIONAL TESTS:  04/02/2023 18 inch chair transfer: Lt SLS: Rt SLS:  GAIT: 04/02/2023                                                                                                                                                                         TODAY'S TREATMENT                                                                          DATE: 04/02/2023 Therex:    HEP instruction/performance c cues for techniques, handout provided.  Trial set performed of each for comprehension and symptom assessment.  See below for exercise list  PATIENT EDUCATION:  Education details: HEP, POC Person educated: Patient Education method: Explanation, Demonstration, Verbal cues, and Handouts Education comprehension: verbalized understanding, returned demonstration, and verbal cues required  HOME EXERCISE PROGRAM: ***  ASSESSMENT:  CLINICAL IMPRESSION: Patient is a 62 y.o. who comes to clinic with complaints of ***pain with mobility, strength and movement coordination deficits that impair their ability to perform usual daily and recreational functional activities without increase difficulty/symptoms at this time.  Patient to benefit from skilled PT services to address impairments and limitations to improve to previous level of function without restriction secondary to condition.   OBJECTIVE IMPAIRMENTS: {opptimpairments:25111}.   ACTIVITY LIMITATIONS: {activitylimitations:27494}  PARTICIPATION LIMITATIONS:  {participationrestrictions:25113}  PERSONAL FACTORS:  Med Hx:  anxiety, back pain, depression, HTN, OA knees, osteoporosis   are also affecting patient's functional outcome.   REHAB POTENTIAL: {rehabpotential:25112}  CLINICAL DECISION MAKING: {clinical decision making:25114}  EVALUATION COMPLEXITY: {Evaluation complexity:25115}  GOALS: Goals reviewed with patient? Yes  SHORT TERM GOALS: (target date for Short term goals are 3 weeks 04/23/2023)   1.  Patient will demonstrate independent use of home exercise program to maintain progress from in clinic treatments.  Goal status: New  LONG TERM GOALS: (target dates for all long term goals are 10 weeks  06/11/2023 )   1. Patient will demonstrate/report pain at worst less than or equal to 2/10 to facilitate minimal limitation in daily activity secondary to pain symptoms.  Goal status: New   2. Patient will demonstrate independent use of home exercise program to facilitate ability to maintain/progress functional gains from skilled physical therapy services.  Goal status: New   3. Patient will demonstrate FOTO outcome > or = *** % to indicate reduced disability due to condition.  Goal status: New   4.  Patient will demonstrate Lt LE MMT 5/5 throughout to faciltiate usual transfers, stairs, squatting at [facility] for daily life.   Goal status: New   5.  Patient will demonstrate Goal status: New   6.  *** Goal status: New   7.  *** Goal Status: New   PLAN:  PT FREQUENCY: 1-2x/week  PT DURATION: 10 weeks  PLANNED INTERVENTIONS: Can include 40981- PT Re-evaluation, 97110-Therapeutic exercises, 97530- Therapeutic activity, [id number]- Neuromuscular re-education, 97535- Self Care, 97140- Manual therapy, [id number]- Gait training, [phone]- Orthotic Fit/training, [phone]- Canalith repositioning, [id number]- Aquatic Therapy, 97014- Electrical stimulation (unattended), [id number]- Electrical stimulation (manual), [id number]- Vasopneumatic device, [id number]- Ultrasound,  [id number]- Traction (mechanical), [id number]- Ionotophoresis 4mg /ml Dexamethasone, Patient/Family education, Balance training, Stair training, Taping, Dry Needling, Joint mobilization, Joint manipulation, Spinal manipulation, Spinal mobilization, Scar mobilization, Vestibular training, Visual/preceptual remediation/compensation, DME instructions, Cryotherapy, and Moist heat.  All performed as medically necessary.  All included unless contraindicated  PLAN FOR NEXT SESSION: Review HEP knowledge/results.    Chyrel Masson, PT, DPT, OCS, ATC 03/28/23  3:20 PM

## 2023-04-02 ENCOUNTER — Ambulatory Visit: Payer: Medicare HMO | Admitting: Rehabilitative and Restorative Service Providers"

## 2023-04-11 ENCOUNTER — Other Ambulatory Visit (HOSPITAL_COMMUNITY)
Admission: RE | Admit: 2023-04-11 | Discharge: 2023-04-11 | Disposition: A | Discharge status: Home / Self Care | Payer: Medicare HMO | Source: Ambulatory Visit | Attending: Obstetrics & Gynecology | Admitting: Obstetrics & Gynecology

## 2023-04-11 ENCOUNTER — Ambulatory Visit: Payer: Medicare HMO | Admitting: Obstetrics & Gynecology

## 2023-04-11 VITALS — BP 141/82 | HR 81 | Ht 67.0 in | Wt 240.2 lb

## 2023-04-11 DIAGNOSIS — N950 Postmenopausal bleeding: Secondary | ICD-10-CM

## 2023-04-11 DIAGNOSIS — N939 Abnormal uterine and vaginal bleeding, unspecified: Secondary | ICD-10-CM | POA: Diagnosis not present

## 2023-04-11 NOTE — Progress Notes (Unsigned)
LMP 04/03/2023  irregular bleeding

## 2023-04-11 NOTE — Progress Notes (Unsigned)
Patient ID: Kathleen Mcfarland, female   DOB: [birth year], 61 y.o.   MRN: [id number]  Chief Complaint  Patient presents with   Follow-up  PMP bleeding   HPI Kathleen Mcfarland is a 62 y.o. female.  [id number] She reports vaginal bleeding that commenced 12/3 and has stopped. She last had bleeding 11/2021. US done 03/2023 showed stable fibroids and thin EM. HPI  Past Medical History:  Diagnosis Date   Allergic rhinitis    Anxiety    Back pain    Cataract    Complication of anesthesia    hard to wake up   Depression    Hypertension    Hypothyroidism    Osteoarthritis 2012   bilateral knees   Osteoarthritis of knee    Osteoporosis     Past Surgical History:  Procedure Laterality Date   COLONOSCOPY  09/05/2016   Dr.Jacobs   DILATION AND CURETTAGE OF UTERUS     ROTATOR CUFF REPAIR     SHOULDER ARTHROSCOPY  04/13/2011   Procedure: ARTHROSCOPY SHOULDER;  Surgeon: Loreta Ave, MD;  Location: [facility];  Service: Orthopedics;  Laterality: Right;  Right Shoulder Arthroscopy with Acrmioploasty, Arhtroscopic rotator cuff repair and Microfracture humeral head   SHOULDER ARTHROSCOPY WITH ROTATOR CUFF REPAIR Right 07/04/2012   Procedure: SHOULDER ARTHROSCOPY WITH ROTATOR CUFF REPAIR;  Surgeon: Loreta Ave, MD;  Location: [facility];  Service: Orthopedics;  Laterality: Right;  RIGHT SHOULDER ARTHROSCOPY WITH ARTHROSCOPIC ROTATOR CUFF REPAIR, LYSIS OF ADHESIONS   TUBAL LIGATION      Family History  Problem Relation Age of Onset   Diabetes Mother    Hypertension Mother    Coronary artery disease Father 84       heart disease diagnoised   Hypertension Father    Heart disease Father    Colon cancer Father        unknown age   Diabetes Father    Hyperlipidemia Father    Arthritis Sister    Hypertension Sister    Diabetes Sister    Colon polyps Sister    Cervical cancer Sister    Other Sister        uterine fibroids   Hypertension Sister     Diabetes Sister    Colon polyps Sister    Colon polyps Brother    Colon cancer Maternal Grandmother    Ovarian cancer Maternal Grandmother    Dislocations Maternal Grandmother    Cirrhosis Maternal Grandfather    Heart disease Paternal Grandmother    Coronary artery disease Paternal Grandmother    Cancer Paternal Grandmother    Colon cancer Other        great uncle at 70, great aunt dx at 36, GM age 61   Colon polyps Other        2 sisters dx at age 18 and 89   Breast cancer Neg Hx    Stroke Neg Hx     Social History Social History   Tobacco Use   Smoking status: Never   Smokeless tobacco: Never  Vaping Use   Vaping status: Never Used  Substance Use Topics   Alcohol use: Yes    Comment: glass of wine once a month   Drug use: No    Allergies  Allergen Reactions   Penicillins Hives   Felodipine Nausea And Vomiting    Current Outpatient Medications  Medication Sig Dispense Refill   Ascorbic Acid (VITAMIN C) 500 MG CAPS Take 1 tab daily  baclofen (LIORESAL) 10 MG tablet Take 0.5-1 tablets (5-10 mg total) by mouth 2 (two) times daily as needed for muscle spasms. 60 tablet 11   cetirizine (ZYRTEC ALLERGY) 10 MG tablet Take 1 tablet (10 mg total) by mouth daily. 90 tablet 3   Cholecalciferol (VITAMIN D) 125 MCG (5000 UT) CAPS Take 125 mcg by mouth daily. 30 capsule 11   SYNTHROID 200 MCG tablet TAKE 1 TABLET BY MOUTH ONCE DAILY BEFORE BREAKFAST 90 tablet 1   triamterene-hydrochlorothiazide (MAXZIDE-25) 37.5-25 MG tablet TAKE 1 TABLET ONE TIME DAILY FOR BLOOD PRESSURE 90 tablet 3   vitamin B-12 (CYANOCOBALAMIN) 1000 MCG tablet Take 1,000 mcg by mouth daily.     Zinc 100 MG TABS Take 1 tab daily  0   zinc gluconate 50 MG tablet Take 50 mg by mouth daily.     acetaminophen (TYLENOL) 500 MG tablet Take 650 mg by mouth every 6 (six) hours as needed. (Patient not taking: Reported on 04/11/2023)     ALPRAZolam (XANAX) 0.25 MG tablet Take 1 tablet (0.25 mg total) by mouth 2  (two) times daily as needed for anxiety. (Patient not taking: Reported on 02/23/2023) 30 tablet 0   celecoxib (CELEBREX) 100 MG capsule Take 1 capsule (100 mg total) by mouth 2 (two) times daily. (Patient not taking: Reported on 04/11/2023) 60 capsule 0   methylPREDNISolone (MEDROL DOSEPAK) 4 MG TBPK tablet Take per packet instructions. Taper dosing. (Patient not taking: Reported on 04/11/2023) 1 each 0   nystatin-triamcinolone ointment (MYCOLOG) Apply 1 Application topically 2 (two) times daily. (Patient not taking: Reported on 04/11/2023) 30 g 0   Semaglutide-Weight Management (WEGOVY) 0.25 MG/0.5ML SOAJ Inject 0.25 mg into the skin once a week. (Patient not taking: Reported on 04/11/2023) 2 mL 0   UNABLE TO FIND JJ SMITH BLOOD SUGAR FOCUS JJ LIVER FOCUS BLUE SKY D3 WITH K2 (Patient not taking: Reported on 04/11/2023)     No current facility-administered medications for this visit.    Review of Systems   Blood pressure (!) 141/82, pulse 81, height 5\' 7"  (1.702 m), weight 240 lb 3.2 oz (109 kg), last menstrual period 05/31/2018.  Physical Exam Physical Exam Exam conducted with a chaperone present.  Constitutional:      Appearance: She is obese.  Genitourinary:    General: Normal vulva.     Exam position: Lithotomy position.     Vagina: No vaginal discharge or bleeding.     Cervix: Normal.     Comments: Patient given informed consent, signed copy in the chart, time out was performed. Appropriate time out taken. . The patient was placed in the lithotomy position and the cervix brought into view with sterile speculum.  Portio of cervix cleansed x 2 with betadine swabs.  A tenaculum was placed in the anterior lip of the cervix.  The uterus was sounded for depth of 8 cm. A pipelle was introduced to into the uterus, suction created,  and an endometrial sample was obtained. All equipment was removed and accounted for.  The patient tolerated the procedure well.    Neurological:     Mental  Status: She is alert.  Psychiatric:        Mood and Affect: Mood normal.        Behavior: Behavior normal.     Data Reviewed [place] result  Assessment Postmenopausal bleeding - Plan: Surgical pathology   Plan F/U on Bx and RTC 3 months    Scheryl Darter 04/11/2023, 10:41 AM

## 2023-04-12 ENCOUNTER — Telehealth: Payer: Self-pay

## 2023-04-12 NOTE — Telephone Encounter (Signed)
Returned call and advised pt that per Dr. Debroah Loop the biopsy is benign and needs no specific follow up, pt voiced understanding.

## 2023-04-12 NOTE — Telephone Encounter (Signed)
Returned call and advised pt that we will send Dr Debroah Loop a message to review the biopsy results and follow up as soon as possible. Sent message to provider

## 2023-04-13 LAB — SURGICAL PATHOLOGY

## 2023-04-19 ENCOUNTER — Ambulatory Visit: Payer: Medicare HMO | Admitting: Sports Medicine

## 2023-04-26 ENCOUNTER — Ambulatory Visit: Payer: Medicare HMO | Admitting: Nurse Practitioner

## 2023-04-26 ENCOUNTER — Telehealth: Payer: Self-pay | Admitting: Nurse Practitioner

## 2023-04-26 VITALS — BP 132/84 | HR 65 | Temp 98.3°F | Ht 67.0 in | Wt 242.5 lb

## 2023-04-26 DIAGNOSIS — F419 Anxiety disorder, unspecified: Secondary | ICD-10-CM

## 2023-04-26 DIAGNOSIS — E669 Obesity, unspecified: Secondary | ICD-10-CM | POA: Diagnosis not present

## 2023-04-26 DIAGNOSIS — R7303 Prediabetes: Secondary | ICD-10-CM | POA: Diagnosis not present

## 2023-04-26 MED ORDER — ALPRAZOLAM 0.25 MG PO TABS: 0.2500 mg | ORAL_TABLET | Freq: Two times a day (BID) | ORAL | 0 refills | Status: DC | PRN

## 2023-04-26 MED ORDER — METFORMIN HCL ER 500 MG PO TB24: 500.0000 mg | ORAL_TABLET | Freq: Every day | ORAL | 2 refills | Status: DC

## 2023-04-26 NOTE — Assessment & Plan Note (Signed)
Chronic, currently exacerbated due to time of year which falls around anniversary of deaths in her family. I am agreeable to prescribing Xanax as long as she is taking this only as needed.  We did discuss if anxiety worsens to the point that she needs this medication on a daily basis I would recommend psychiatry referral.  She reports understanding.  [place] controlled substance database reviewed, prescription sent to pharmacy (Xanax 0.25 mg tablets, take 1 tablet by mouth twice a day as needed for anxiety, quantity 30 tablets).

## 2023-04-26 NOTE — Assessment & Plan Note (Signed)
Chronic Current weight 242 pounds, BMI 37.98. She reports insurance does not cover injectable treatment options.  Due to her history of hypertension I feel phentermine is not a very safe option.  We discussed off label use of metformin for treatment of prediabetes and obesity, she would be open to trying this.  We discussed side effects, and the avoidance of use of metformin if she gets sick enough that she is vomiting.  She reports understanding.  Start with metformin 500 mg daily with breakfast, follow-up in 6 weeks.

## 2023-04-26 NOTE — Progress Notes (Signed)
Established Patient Office Visit  Subjective   Patient ID: Kathleen Mcfarland, female    DOB: 1961/04/07  Age: 63 y.o. MRN: [id number]  Chief Complaint  Patient presents with   Obesity    Patient arrives today to officially establish care.  She has been seen at this office previously, but was unsure if she wanted to establish care for primary care needs.  She has not made that choice and would like to come here.  Her main concerns that she would like to discuss today are obesity and anxiety.  She reports around this time of year her anxiety worsens because it is the anniversary of deaths in her family.  She takes Xanax as needed, and reports very seldom use.  Per [facility] controlled substance database this has not been prescribed since 03/2022.  She prefers not to take a daily depression/anxiety medication. She reports that she has made changes to her diet focusing mainly on green smoothies and avoidance of fried foods and fast foods.  Despite this she has been unable to lose and maintain weight loss.  She also has significant osteoarthritis as well as tendinitis in her feet which affects her ability to exercise.  She reports she is able to do aquatic exercises and able to use bike.  She would like to lose about 40 pounds if possible.  She reports that her insurance has denied Wegovy in the past.  Current BMI is 37.98.  She is prediabetic with last A1c of 6.3.  She denies any history of heart attack or stroke personally.    ROS: see HPI    Objective:     BP 132/84   Pulse 65   Temp 98.3 F (36.8 C) (Temporal)   Ht 5\' 7"  (1.702 m)   Wt 242 lb 8 oz (110 kg)   LMP 04/09/2023 (Exact Date)   SpO2 96%   BMI 37.98 kg/m  BP Readings from Last 3 Encounters:  04/26/23 132/84  04/11/23 (!) 141/82  02/23/23 (!) 168/78   Wt Readings from Last 3 Encounters:  04/26/23 242 lb 8 oz (110 kg)  04/11/23 240 lb 3.2 oz (109 kg)  02/23/23 247 lb (112 kg)      Physical  Exam Constitutional:      General: She is not in acute distress. HENT:     Head: Normocephalic.  Pulmonary:     Effort: Pulmonary effort is normal.  Neurological:     General: No focal deficit present.     Mental Status: She is alert. Mental status is at baseline.  Psychiatric:        Mood and Affect: Mood normal.        Behavior: Behavior normal.        Thought Content: Thought content normal.        Judgment: Judgment normal.      No results found for any visits on 04/26/23.    The 10-year ASCVD risk score (Arnett DK, et al., 2019) is: 8%    Assessment & Plan:   Problem List Items Addressed This Visit       Other   Pre-diabetes   Chronic Last A1c 6.3. She reports insurance does not cover injectable treatment options.  Due to her history of hypertension I feel phentermine is not a very safe option.  We discussed off label use of metformin for treatment of prediabetes and obesity, she would be open to trying this.  We discussed side effects, and the avoidance of  use of metformin if she gets sick enough that she is vomiting.  She reports understanding.  Start with metformin 500 mg daily with breakfast, follow-up in 6 weeks.  Lab was closed by the time of appointment was completed, recommend following back up to lab within the next week or so to recheck A1c and metabolic panel.  She reports her understanding.  Further recommendations may be made based upon these results.      Relevant Medications   metFORMIN (GLUCOPHAGE-XR) 500 MG 24 hr tablet   Other Relevant Orders   Hemoglobin A1c   Comprehensive metabolic panel   Obesity (BMI 16-10.9)   Chronic Current weight 242 pounds, BMI 37.98. She reports insurance does not cover injectable treatment options.  Due to her history of hypertension I feel phentermine is not a very safe option.  We discussed off label use of metformin for treatment of prediabetes and obesity, she would be open to trying this.  We discussed side  effects, and the avoidance of use of metformin if she gets sick enough that she is vomiting.  She reports understanding.  Start with metformin 500 mg daily with breakfast, follow-up in 6 weeks.       Relevant Medications   metFORMIN (GLUCOPHAGE-XR) 500 MG 24 hr tablet   Anxiety - Primary   Chronic, currently exacerbated due to time of year which falls around anniversary of deaths in her family. I am agreeable to prescribing Xanax as long as she is taking this only as needed.  We did discuss if anxiety worsens to the point that she needs this medication on a daily basis I would recommend psychiatry referral.  She reports understanding.  [place] controlled substance database reviewed, prescription sent to pharmacy (Xanax 0.25 mg tablets, take 1 tablet by mouth twice a day as needed for anxiety, quantity 30 tablets).      Relevant Medications   ALPRAZolam (XANAX) 0.25 MG tablet    Return in about 6 weeks (around 06/07/2023) for F/U with Maralyn Sago.    Elenore Paddy, NP

## 2023-04-26 NOTE — Patient Instructions (Signed)
Good Rx Coupon  Exercise: 150 mins/week Low-glycemic index Hydration: 60-80 ounces of water/day Calorie tracking: Goal is 1100-1200 cals/day Weigh food on food scale to know what portions that you are taking in and to help track calories

## 2023-04-26 NOTE — Assessment & Plan Note (Signed)
Chronic Last A1c 6.3. She reports insurance does not cover injectable treatment options.  Due to her history of hypertension I feel phentermine is not a very safe option.  We discussed off label use of metformin for treatment of prediabetes and obesity, she would be open to trying this.  We discussed side effects, and the avoidance of use of metformin if she gets sick enough that she is vomiting.  She reports understanding.  Start with metformin 500 mg daily with breakfast, follow-up in 6 weeks.  Lab was closed by the time of appointment was completed, recommend following back up to lab within the next week or so to recheck A1c and metabolic panel.  She reports her understanding.  Further recommendations may be made based upon these results.

## 2023-04-26 NOTE — Telephone Encounter (Signed)
Can we start prior authorization for ZepBound?

## 2023-04-27 ENCOUNTER — Telehealth: Payer: Self-pay

## 2023-04-27 ENCOUNTER — Other Ambulatory Visit (HOSPITAL_COMMUNITY): Payer: Self-pay

## 2023-04-27 NOTE — Telephone Encounter (Signed)
Pharmacy Patient Advocate Encounter   Received notification from Pt Calls Messages that prior authorization for Zepbound 2.5mg /0.43ml is required/requested.   Insurance verification completed.   The patient is insured through [place] .   Per test claim: Zepbound is not covered under [place] Law.

## 2023-04-30 NOTE — Telephone Encounter (Signed)
Left detail message for pt about medication being denied due to it not being part of insurance plan

## 2023-05-03 ENCOUNTER — Ambulatory Visit: Payer: Medicare HMO | Admitting: Obstetrics and Gynecology

## 2023-05-28 ENCOUNTER — Other Ambulatory Visit: Payer: Self-pay | Admitting: Internal Medicine

## 2023-05-28 DIAGNOSIS — Z1231 Encounter for screening mammogram for malignant neoplasm of breast: Secondary | ICD-10-CM

## 2023-06-04 ENCOUNTER — Telehealth: Payer: Self-pay | Admitting: Nurse Practitioner

## 2023-06-04 NOTE — Telephone Encounter (Signed)
Copied from CRM [id number]. Topic: General - Other >> Jun 04, 2023  1:59 PM Eunice Blase wrote: Reason for CRM: Pt ask if she needs to do blood work if so please call her. [phone]

## 2023-06-04 NOTE — Telephone Encounter (Signed)
Made pt aware of labs that is needed.

## 2023-06-11 ENCOUNTER — Other Ambulatory Visit (INDEPENDENT_AMBULATORY_CARE_PROVIDER_SITE_OTHER): Payer: Medicare HMO

## 2023-06-11 DIAGNOSIS — R7303 Prediabetes: Secondary | ICD-10-CM | POA: Diagnosis not present

## 2023-06-11 LAB — COMPREHENSIVE METABOLIC PANEL
ALT: 23 U/L (ref 0–35)
AST: 28 U/L (ref 0–37)
Albumin: 3.8 g/dL (ref 3.5–5.2)
Alkaline Phosphatase: 43 U/L (ref 39–117)
BUN: 14 mg/dL (ref 6–23)
CO2: 27 meq/L (ref 19–32)
Calcium: 9.4 mg/dL (ref 8.4–10.5)
Chloride: 102 meq/L (ref 96–112)
Creatinine, Ser: 0.63 mg/dL (ref 0.40–1.20)
GFR: 95.78 mL/min (ref >60.00)
Glucose, Bld: 112 mg/dL — ABNORMAL HIGH (ref 70–99)
Potassium: 3.6 meq/L (ref 3.5–5.1)
Sodium: 137 meq/L (ref 135–145)
Total Bilirubin: 0.5 mg/dL (ref 0.2–1.2)
Total Protein: 6.3 g/dL (ref 6.0–8.3)

## 2023-06-13 ENCOUNTER — Telehealth: Payer: Self-pay

## 2023-06-13 ENCOUNTER — Ambulatory Visit: Payer: Medicare HMO | Admitting: Nurse Practitioner

## 2023-06-13 ENCOUNTER — Telehealth: Payer: Self-pay | Admitting: Nurse Practitioner

## 2023-06-13 ENCOUNTER — Other Ambulatory Visit (HOSPITAL_COMMUNITY): Payer: Self-pay

## 2023-06-13 VITALS — BP 120/82 | HR 81 | Temp 97.9°F | Ht 67.0 in | Wt 245.0 lb

## 2023-06-13 DIAGNOSIS — R7303 Prediabetes: Secondary | ICD-10-CM

## 2023-06-13 DIAGNOSIS — E785 Hyperlipidemia, unspecified: Secondary | ICD-10-CM | POA: Diagnosis not present

## 2023-06-13 DIAGNOSIS — Z6838 Body mass index (BMI) 38.0-38.9, adult: Secondary | ICD-10-CM | POA: Diagnosis not present

## 2023-06-13 DIAGNOSIS — R413 Other amnesia: Secondary | ICD-10-CM

## 2023-06-13 DIAGNOSIS — E669 Obesity, unspecified: Secondary | ICD-10-CM

## 2023-06-13 LAB — FERRITIN: Ferritin: 12.6 ng/mL (ref 10.0–291.0)

## 2023-06-13 LAB — CBC
HCT: 44 % (ref 36.0–46.0)
Hemoglobin: 14.5 g/dL (ref 12.0–15.0)
MCHC: 32.9 g/dL (ref 30.0–36.0)
MCV: 84.3 fl (ref 78.0–100.0)
Platelets: 234 K/uL (ref 150.0–400.0)
RBC: 5.23 Mil/uL — ABNORMAL HIGH (ref 3.87–5.11)
RDW: 13.2 % (ref 11.5–15.5)
WBC: 4.8 K/uL (ref 4.0–10.5)

## 2023-06-13 LAB — BASIC METABOLIC PANEL
BUN: 16 mg/dL (ref 6–23)
CO2: 27 meq/L (ref 19–32)
Calcium: 9.3 mg/dL (ref 8.4–10.5)
Chloride: 101 meq/L (ref 96–112)
Creatinine, Ser: 0.59 mg/dL (ref 0.40–1.20)
GFR: 97.3 mL/min (ref >60.00)
Glucose, Bld: 120 mg/dL — ABNORMAL HIGH (ref 70–99)
Potassium: 3.8 meq/L (ref 3.5–5.1)
Sodium: 136 meq/L (ref 135–145)

## 2023-06-13 LAB — IRON: Iron: 90 ug/dL (ref 42–145)

## 2023-06-13 LAB — LIPID PANEL
Cholesterol: 148 mg/dL (ref 0–200)
HDL: 32.5 mg/dL — ABNORMAL LOW
LDL Cholesterol: 84 mg/dL (ref 0–99)
NonHDL: 115.04
Total CHOL/HDL Ratio: 5
Triglycerides: 154 mg/dL — ABNORMAL HIGH (ref 0.0–149.0)
VLDL: 30.8 mg/dL (ref 0.0–40.0)

## 2023-06-13 LAB — HEMOGLOBIN A1C: Hgb A1c MFr Bld: 6.3 % (ref 4.6–6.5)

## 2023-06-13 LAB — VITAMIN B12: Vitamin B-12: 737 pg/mL (ref 211–911)

## 2023-06-13 LAB — TSH: TSH: 0.47 u[IU]/mL (ref 0.35–5.50)

## 2023-06-13 NOTE — Assessment & Plan Note (Signed)
Labs ordered, further recommendations may be made based upon these results

## 2023-06-13 NOTE — Assessment & Plan Note (Signed)
Chronic Current weight 245 pounds, current BMI 38.37 She be a good candidate for Zepbound.  Will initiate prior authorization, further recommendations may be made based upon these results. Patient given handout medication. Discussed risks as well as blackbox warning. Discussed side effects and when she should report these.

## 2023-06-13 NOTE — Patient Instructions (Addendum)
Calorie Deficit Diet: 1200 calories per day Track with some sort of app or on paper (my fitness pal is an app you can use on your phone) Try to exercise when you can with goal of 150 minutes/week Weigh food on a food scale to make sure you are tracking calories accurately Eat a low-glycemic index diet (you can Google this but basically this means eating lean proteins [fish, chicken, turkey], nonstarchy vegetables, fruit)  Avoid excessive salt intake.

## 2023-06-13 NOTE — Assessment & Plan Note (Signed)
Chronic Fasting labs ordered, further recommendations may be made based upon his results

## 2023-06-13 NOTE — Telephone Encounter (Signed)
Pharmacy Patient Advocate Encounter   Received notification from Pt Calls Messages that prior authorization for Zepbound 2.5mg /0.76ml is required/requested.   Insurance verification completed.   The patient is insured through [facility] .   Per test claim: PA required; PA submitted to above mentioned insurance via CoverMyMeds Key/confirmation #/EOC  [id number] Status is pending

## 2023-06-13 NOTE — Assessment & Plan Note (Signed)
Chronic 6Cit screening is negative today.  Will order labs for further evaluation and consider referral to neurology if needed for further evaluation.

## 2023-06-13 NOTE — Telephone Encounter (Signed)
Patient has changed insurance companies and would like to initiate a new prior authorization for zepbound for treatment of obesity. Current BMI 38.37

## 2023-06-13 NOTE — Progress Notes (Signed)
Established Patient Office Visit  Subjective   Patient ID: Kathleen Mcfarland, female    DOB: 07/11/1960  Age: 63 y.o. MRN: [id number]  Chief Complaint  Patient presents with   Obesity    Obesity: Patient reports difficulty with losing weight.  She has hypothyroidism as well as osteoarthritis which affects her ability to exercise and is making weight loss challenging.  Patient reports she currently works out twice a week for 30 to 60 minutes at a time.  She will do water aerobics and/or sit down elliptical.  Her current BMI is 38.37 with current weight of 245 pounds.  She reports "cutting back" on diet and has been drinking smoothies as well as protein shakes.  She has tried and failed metformin due to GI upset.  Denies personal or family history of thyroid cancer, pancreatitis, gallstones.  In addition to her obesity she has comorbid prediabetes, hyperlipidemia, and hypertension.    Hypothyroidism: Continues on Synthroid, tolerating well.  Due to have TSH checked today.  Memory loss: Reports that sometimes she feels forgetful and is concerned about dementia.    ROS: see HPI    Objective:     BP 120/82   Pulse 81   Temp 97.9 F (36.6 C) (Temporal)   Ht 5\' 7"  (1.702 m)   Wt 245 lb (111.1 kg)   SpO2 98%   BMI 38.37 kg/m  BP Readings from Last 3 Encounters:  06/13/23 120/82  04/26/23 132/84  04/11/23 (!) 141/82   Wt Readings from Last 3 Encounters:  06/13/23 245 lb (111.1 kg)  04/26/23 242 lb 8 oz (110 kg)  04/11/23 240 lb 3.2 oz (109 kg)      06/13/2023   11:52 AM  6CIT Screen  What Year? 0 points  What month? 0 points  What time? 0 points  Count back from 20 0 points  Months in reverse 4 points  Repeat phrase 2 points  Total Score 6 points        Physical Exam Vitals reviewed.  Constitutional:      General: She is not in acute distress.    Appearance: Normal appearance.  HENT:     Head: Normocephalic and atraumatic.  Neck:     Vascular: No  carotid bruit.  Cardiovascular:     Rate and Rhythm: Normal rate and regular rhythm.     Pulses: Normal pulses.     Heart sounds: Normal heart sounds.  Pulmonary:     Effort: Pulmonary effort is normal.     Breath sounds: Normal breath sounds.  Skin:    General: Skin is warm and dry.  Neurological:     General: No focal deficit present.     Mental Status: She is alert and oriented to person, place, and time.  Psychiatric:        Mood and Affect: Mood normal.        Behavior: Behavior normal.        Judgment: Judgment normal.      No results found for any visits on 06/13/23.  Last metabolic panel Lab Results  Component Value Date   GLUCOSE 112 (H) 06/11/2023   NA 137 06/11/2023   K 3.6 06/11/2023   CL 102 06/11/2023   CO2 27 06/11/2023   BUN 14 06/11/2023   CREATININE 0.63 06/11/2023   GFR 95.78 06/11/2023   CALCIUM 9.4 06/11/2023   PROT 6.3 06/11/2023   ALBUMIN 3.8 06/11/2023   LABGLOB 2.2 12/22/2019   AGRATIO 1.7 12/22/2019  BILITOT 0.5 06/11/2023   ALKPHOS 43 06/11/2023   AST 28 06/11/2023   ALT 23 06/11/2023   ANIONGAP 9 07/15/2021   Last lipids Lab Results  Component Value Date   CHOL 165 09/08/2022   HDL 37.90 (L) 09/08/2022   LDLCALC 105 (H) 09/08/2022   TRIG 109.0 09/08/2022   CHOLHDL 4 09/08/2022   Last hemoglobin A1c Lab Results  Component Value Date   HGBA1C 6.3 09/08/2022      The 10-year ASCVD risk score (Arnett DK, et al., 2019) is: 6.1%    Assessment & Plan:   Problem List Items Addressed This Visit       Other   Pre-diabetes - Primary   Labs ordered, further recommendations may be made based upon these results       Relevant Orders   Amb ref to Medical Nutrition Therapy-MNT   TSH   Hemoglobin A1c   Basic metabolic panel   Vitamin B12   CBC   Iron   Ferritin   Obesity (BMI 30-39.9)   Chronic Current weight 245 pounds, current BMI 38.37 She be a good candidate for Zepbound.  Will initiate prior authorization, further  recommendations may be made based upon these results. Patient given handout medication. Discussed risks as well as blackbox warning. Discussed side effects and when she should report these.      Relevant Orders   Amb ref to Medical Nutrition Therapy-MNT   TSH   Hemoglobin A1c   Basic metabolic panel   Vitamin B12   CBC   Iron   Ferritin   Memory change   Chronic 6Cit screening is negative today.  Will order labs for further evaluation and consider referral to neurology if needed for further evaluation.      Relevant Orders   TSH   Hemoglobin A1c   Basic metabolic panel   Vitamin B12   CBC   Iron   Ferritin   Hyperlipidemia   Chronic Fasting labs ordered, further recommendations may be made based upon his results       Relevant Orders   Lipid panel    Return in about 6 weeks (around 07/25/2023) for F/U with Maralyn Sago.  Total time spent on encounter today was 45 minutes including face-to-face evaluation, review previous medical records, and discussion/development of treatment plan.   Elenore Paddy, NP

## 2023-06-15 ENCOUNTER — Other Ambulatory Visit: Payer: Self-pay | Admitting: Nurse Practitioner

## 2023-06-15 DIAGNOSIS — R413 Other amnesia: Secondary | ICD-10-CM

## 2023-06-18 NOTE — Telephone Encounter (Signed)
Pharmacy Patient Advocate Encounter  Received notification from [employer] that Prior Authorization for Zepbound has been DENIED.  Full denial letter will be uploaded to the media tab. See denial reason below.    PA #/Case ID/Reference #: [id number], please see denial letter in media tab

## 2023-06-18 NOTE — Telephone Encounter (Signed)
 Made pt aware via mychart.

## 2023-06-18 NOTE — Telephone Encounter (Signed)
Received notification from [employer] that Prior Authorization for Zepbound has been DENIED.  Full denial letter will be uploaded to the media tab. See denial reason below.

## 2023-06-22 ENCOUNTER — Telehealth: Payer: Self-pay | Admitting: Radiology

## 2023-06-22 ENCOUNTER — Ambulatory Visit: Payer: Self-pay | Admitting: Nurse Practitioner

## 2023-06-22 ENCOUNTER — Other Ambulatory Visit: Payer: Self-pay | Admitting: Nurse Practitioner

## 2023-06-22 DIAGNOSIS — E669 Obesity, unspecified: Secondary | ICD-10-CM

## 2023-06-22 NOTE — Telephone Encounter (Signed)
This RN first attempt to contact patient for triage. No answer, voicemail left requesting return call to clinic.

## 2023-06-22 NOTE — Telephone Encounter (Signed)
This RN made third attempt to contact patient. No answer and voicemail full so unable to leave message

## 2023-06-22 NOTE — Telephone Encounter (Signed)
This RN made second attempt to contact patient. No answer and voicemail full so unable to leave message.  Copied From CRM [id number]. Reason for Triage: Sneezing, coughing, hot and cold chills, stuffy nose and no appetite. Body aches, body feels sore. Feels like she's losing her taste buds.

## 2023-06-22 NOTE — Telephone Encounter (Signed)
Copied from CRM [id number]. Topic: Clinical - Lab/Test Results >> Jun 22, 2023  1:41 PM Gurney Maxin H wrote: Reason for CRM: Patient states she has questions regarding her lab results that came back on MyChart and would like to speak with someone in the office.  Feleshia [phone]

## 2023-06-22 NOTE — Telephone Encounter (Signed)
Called pt to follow up on labs, pt stated issue was taken care of via my chart message from provider

## 2023-06-23 ENCOUNTER — Ambulatory Visit
Admission: EM | Admit: 2023-06-23 | Discharge: 2023-06-23 | Disposition: A | Discharge status: Home / Self Care | Payer: Medicare HMO | Attending: Family Medicine | Admitting: Family Medicine

## 2023-06-23 ENCOUNTER — Encounter: Payer: Self-pay | Admitting: Emergency Medicine

## 2023-06-23 DIAGNOSIS — J069 Acute upper respiratory infection, unspecified: Secondary | ICD-10-CM | POA: Diagnosis not present

## 2023-06-23 LAB — POC COVID19/FLU A&B COMBO
Covid Antigen, POC: NEGATIVE
Influenza A Antigen, POC: NEGATIVE
Influenza B Antigen, POC: NEGATIVE

## 2023-06-23 MED ORDER — BENZONATATE 100 MG PO CAPS: 100.0000 mg | ORAL_CAPSULE | Freq: Three times a day (TID) | ORAL | 0 refills | Status: DC | PRN

## 2023-06-23 NOTE — ED Provider Notes (Signed)
 [facility]    CSN: [id number] Arrival date & time: 06/23/23  0855      History   Chief Complaint Chief Complaint  Patient presents with   Cough   sneezing   Generalized Body Aches   Anorexia   Loss of smell    HPI Kathleen Mcfarland is a 63 y.o. female.    Cough Here for cough and nasal congestion and postnasal drainage.  Symptoms began on February 18.  She then developed or myalgia and had some chills and then in the last 24 hours has lost her sense of taste and smell.  No shortness of breath and no vomiting or diarrhea. Her appetite is decreased  No fever.  She is allergic to penicillin and felodipine and metformin   Past Medical History:  Diagnosis Date   Allergic rhinitis    Anxiety    Back pain    Cataract    Complication of anesthesia    hard to wake up   Depression    Hypertension    Hypothyroidism    Osteoarthritis 2012   bilateral knees   Osteoarthritis of knee    Osteoporosis     Patient Active Problem List   Diagnosis Date Noted   Memory change 06/13/2023   Hyperlipidemia 06/13/2023   Anxiety 04/26/2023   Bouchard's node 09/08/2022   Hypertensive disorder 05/10/2021   Lymphadenopathy 03/11/2020   Neck mass 03/11/2020   Trigger finger 02/18/2020   Trigger thumb of right hand 01/15/2020   Vitamin D deficiency 08/08/2019   Bilateral lower extremity edema 08/08/2019   Pre-diabetes 06/04/2017   Menopausal syndrome (hot flashes) 06/04/2017   History of colonic polyps 06/04/2017   Chronic use of opiate for therapeutic purpose 07/03/2016   Disorder of rotator cuff of both shoulders 05/19/2015   Obesity (BMI 30-39.9) 05/19/2015   Severe recurrent major depressive disorder with psychotic features (HCC) 01/03/2014   Depression 01/02/2014   Urinary incontinence 07/21/2010   HYPERTENSION, BENIGN ESSENTIAL 07/31/2006   Hypothyroidism (acquired) 06/23/2006   Seasonal allergies 06/23/2006   Osteoarthrosis involving lower leg  06/23/2006    Past Surgical History:  Procedure Laterality Date   COLONOSCOPY  09/05/2016   Dr.Jacobs   DILATION AND CURETTAGE OF UTERUS     ROTATOR CUFF REPAIR     SHOULDER ARTHROSCOPY  04/13/2011   Procedure: ARTHROSCOPY SHOULDER;  Surgeon: Loreta Ave, MD;  Location: [facility];  Service: Orthopedics;  Laterality: Right;  Right Shoulder Arthroscopy with Acrmioploasty, Arhtroscopic rotator cuff repair and Microfracture humeral head   SHOULDER ARTHROSCOPY WITH ROTATOR CUFF REPAIR Right 07/04/2012   Procedure: SHOULDER ARTHROSCOPY WITH ROTATOR CUFF REPAIR;  Surgeon: Loreta Ave, MD;  Location: [facility];  Service: Orthopedics;  Laterality: Right;  RIGHT SHOULDER ARTHROSCOPY WITH ARTHROSCOPIC ROTATOR CUFF REPAIR, LYSIS OF ADHESIONS   TUBAL LIGATION      OB History     Gravida  2   Para  2   Term  2   Preterm      AB      Living  2      SAB      IAB      Ectopic      Multiple      Live Births  2            Home Medications    Prior to Admission medications   Medication Sig Start Date End Date Taking? Authorizing Provider  benzonatate (TESSALON) 100 MG  capsule Take 1 capsule (100 mg total) by mouth 3 (three) times daily as needed for cough. 06/23/23  Yes Zenia Resides, MD  acetaminophen (TYLENOL) 500 MG tablet Take 650 mg by mouth every 6 (six) hours as needed.    [provider]  ALPRAZolam Prudy Feeler) 0.25 MG tablet Take 1 tablet (0.25 mg total) by mouth 2 (two) times daily as needed for anxiety. 04/26/23   Elenore Paddy, NP  Ascorbic Acid (VITAMIN C) 500 MG CAPS Take 1 tab daily 02/02/20   Lezlie Lye, Meda Coffee, MD  baclofen (LIORESAL) 10 MG tablet Take 0.5-1 tablets (5-10 mg total) by mouth 2 (two) times daily as needed for muscle spasms. 01/31/23   Madelyn Brunner, DO  cetirizine (ZYRTEC ALLERGY) 10 MG tablet Take 1 tablet (10 mg total) by mouth daily. 12/06/20   Hilts, Casimiro Needle, MD  Cholecalciferol (VITAMIN D) 125  MCG (5000 UT) CAPS Take 125 mcg by mouth daily. 02/02/20   Noni Saupe, MD  nystatin-triamcinolone ointment [facility]) Apply 1 Application topically 2 (two) times daily. 02/23/23   Constant, Peggy, MD  SYNTHROID 200 MCG tablet TAKE 1 TABLET BY MOUTH ONCE DAILY BEFORE BREAKFAST 07/21/20   Hilts, Casimiro Needle, MD  triamterene-hydrochlorothiazide (MAXZIDE-25) 37.5-25 MG tablet TAKE 1 TABLET ONE TIME DAILY FOR BLOOD PRESSURE 09/29/20   Hilts, Casimiro Needle, MD  UNABLE TO FIND Brooke Dare BLOOD SUGAR FOCUS JJ LIVER FOCUS BLUE SKY D3 WITH K2    [provider]  vitamin B-12 (CYANOCOBALAMIN) 1000 MCG tablet Take 1,000 mcg by mouth daily.    [provider]  Zinc 100 MG TABS Take 1 tab daily 02/02/20   Lezlie Lye, Meda Coffee, MD  zinc gluconate 50 MG tablet Take 50 mg by mouth daily.    [provider]    Family History Family History  Problem Relation Age of Onset   Diabetes Mother    Hypertension Mother    Coronary artery disease Father 75       heart disease diagnoised   Hypertension Father    Heart disease Father    Colon cancer Father        unknown age   Diabetes Father    Hyperlipidemia Father    Arthritis Sister    Hypertension Sister    Diabetes Sister    Colon polyps Sister    Cervical cancer Sister    Other Sister        uterine fibroids   Hypertension Sister    Diabetes Sister    Colon polyps Sister    Colon polyps Brother    Colon cancer Maternal Grandmother    Ovarian cancer Maternal Grandmother    Dislocations Maternal Grandmother    Cirrhosis Maternal Grandfather    Heart disease Paternal Grandmother    Coronary artery disease Paternal Grandmother    Cancer Paternal Grandmother    Colon cancer Other        great uncle at 14, great aunt dx at 44, GM age 7   Colon polyps Other        2 sisters dx at age 17 and 52   Breast cancer Neg Hx    Stroke Neg Hx     Social History Social History   Tobacco Use   Smoking status: Never   Smokeless  tobacco: Never  Vaping Use   Vaping status: Never Used  Substance Use Topics   Alcohol use: Yes    Comment: glass of wine once a month   Drug  use: No     Allergies   Penicillins, Felodipine, and Metformin and related   Review of Systems Review of Systems  Respiratory:  Positive for cough.      Physical Exam Triage Vital Signs ED Triage Vitals  Encounter Vitals Group     BP 06/23/23 0950 132/84     Systolic BP Percentile --      Diastolic BP Percentile --      Pulse Rate 06/23/23 0950 80     Resp 06/23/23 0950 16     Temp 06/23/23 0950 98.4 F (36.9 C)     Temp Source 06/23/23 0950 Oral     SpO2 06/23/23 0950 96 %     Weight --      Height --      Head Circumference --      Peak Flow --      Pain Score 06/23/23 0948 0     Pain Loc --      Pain Education --      Exclude from Growth Chart --    No data found.  Updated Vital Signs BP 132/84 (BP Location: Left Arm)   Pulse 80   Temp 98.4 F (36.9 C) (Oral)   Resp 16   LMP  (LMP Unknown)   SpO2 96%   Visual Acuity Right Eye Distance:   Left Eye Distance:   Bilateral Distance:    Right Eye Near:   Left Eye Near:    Bilateral Near:     Physical Exam Vitals reviewed.  Constitutional:      General: She is not in acute distress.    Appearance: She is not ill-appearing, toxic-appearing or diaphoretic.  HENT:     Right Ear: Tympanic membrane and ear canal normal.     Left Ear: Tympanic membrane and ear canal normal.     Nose: Congestion present.     Mouth/Throat:     Mouth: Mucous membranes are moist.  Eyes:     Extraocular Movements: Extraocular movements intact.     Conjunctiva/sclera: Conjunctivae normal.     Pupils: Pupils are equal, round, and reactive to light.  Cardiovascular:     Rate and Rhythm: Normal rate and regular rhythm.     Heart sounds: No murmur heard. Pulmonary:     Effort: Pulmonary effort is normal. No respiratory distress.     Breath sounds: No stridor. No wheezing, rhonchi or  rales.  Musculoskeletal:     Cervical back: Neck supple.  Lymphadenopathy:     Cervical: No cervical adenopathy.  Skin:    Capillary Refill: Capillary refill takes less than 2 seconds.     Coloration: Skin is not jaundiced or pale.  Neurological:     General: No focal deficit present.     Mental Status: She is alert and oriented to person, place, and time.  Psychiatric:        Behavior: Behavior normal.      UC Treatments / Results  Labs (all labs ordered are listed, but only abnormal results are displayed) Labs Reviewed  POC COVID19/FLU A&B COMBO - Normal    EKG   Radiology No results found.  Procedures Procedures (including critical care time)  Medications Ordered in UC Medications - No data to display  Initial Impression / Assessment and Plan / UC Course  I have reviewed the triage vital signs and the nursing notes.  Pertinent labs & imaging results that were available during my care of the patient were reviewed by  me and considered in my medical decision making (see chart for details).     COVID and flu are negative.  Tessalon Perles are sent in for the cough. Final Clinical Impressions(s) / UC Diagnoses   Final diagnoses:  Viral URI     Discharge Instructions      Your COVID and flu test was negative.  Most likely is some other viral upper respiratory infection that you have.  Take benzonatate 100 mg, 1 tab every 8 hours as needed for cough.       ED Prescriptions     Medication Sig Dispense Auth. Provider   benzonatate (TESSALON) 100 MG capsule Take 1 capsule (100 mg total) by mouth 3 (three) times daily as needed for cough. 21 capsule Zenia Resides, MD      PDMP not reviewed this encounter.   Zenia Resides, MD 06/23/23 [id number]

## 2023-06-23 NOTE — Discharge Instructions (Addendum)
 Your COVID and flu test was negative.  Most likely is some other viral upper respiratory infection that you have.  Take benzonatate 100 mg, 1 tab every 8 hours as needed for cough.

## 2023-06-23 NOTE — ED Triage Notes (Signed)
 Pt presents with COVID symptoms that onset on Tuesday. She states symptoms got worse throughout the week and today she lost her sense of taste. Pt denies emesis, SOB, and fever thus far.

## 2023-06-27 ENCOUNTER — Telehealth: Payer: Self-pay

## 2023-06-27 ENCOUNTER — Telehealth: Payer: Medicare HMO | Admitting: Nurse Practitioner

## 2023-06-27 NOTE — Telephone Encounter (Signed)
 Called pt 5 min before her appointment to start visit, pt stated that she went to ED for her concern, so she no longer want to do the visit. No show pt because pt did not call early to cancel appointment

## 2023-07-16 NOTE — Telephone Encounter (Signed)
 Pt made aware of this issue

## 2023-07-24 ENCOUNTER — Other Ambulatory Visit: Payer: Self-pay | Admitting: Nurse Practitioner

## 2023-07-24 DIAGNOSIS — I10 Essential (primary) hypertension: Secondary | ICD-10-CM

## 2023-07-24 MED ORDER — TRIAMTERENE-HCTZ 37.5-25 MG PO TABS: ORAL_TABLET | ORAL | 0 refills | Status: DC

## 2023-07-24 NOTE — Telephone Encounter (Signed)
 Copied from CRM [id number]. Topic: Clinical - Medication Refill >> Jul 24, 2023 11:57 AM [name] wrote: Most Recent Primary Care Visit:  Provider: Elenore Paddy  Department: [facility]  Visit Type: OFFICE VISIT  Date: 06/13/2023  Medication: triamterene-hydrochlorothiazide (MAXZIDE-25) 37.5-25 MG tablet   Has the patient contacted their pharmacy? Yes (Agent: If no, request that the patient contact the pharmacy for the refill. If patient does not wish to contact the pharmacy document the reason why and proceed with request.) (Agent: If yes, when and what did the pharmacy advise?)  Is this the correct pharmacy for this prescription? Yes If no, delete pharmacy and type the correct one.  This is the patient's preferred pharmacy:   [facility] [address] [place], [place] - [address] [place] [address] Phone: [phone] Fax: [phone]   Has the prescription been filled recently? Yes  Is the patient out of the medication? Yes  Has the patient been seen for an appointment in the last year OR does the patient have an upcoming appointment? Yes  Can we respond through MyChart? Yes  Agent: Please be advised that Rx refills may take up to 3 business days. We ask that you follow-up with your pharmacy.

## 2023-07-26 ENCOUNTER — Ambulatory Visit (INDEPENDENT_AMBULATORY_CARE_PROVIDER_SITE_OTHER): Payer: Medicare HMO | Admitting: Nurse Practitioner

## 2023-07-26 VITALS — BP 132/88 | HR 85 | Temp 97.8°F | Ht 67.0 in | Wt 248.0 lb

## 2023-07-26 DIAGNOSIS — E669 Obesity, unspecified: Secondary | ICD-10-CM | POA: Diagnosis not present

## 2023-07-26 DIAGNOSIS — Z6838 Body mass index (BMI) 38.0-38.9, adult: Secondary | ICD-10-CM

## 2023-07-26 DIAGNOSIS — J029 Acute pharyngitis, unspecified: Secondary | ICD-10-CM | POA: Diagnosis not present

## 2023-07-26 LAB — POCT RAPID STREP A (OFFICE): Rapid Strep A Screen: NEGATIVE

## 2023-07-26 LAB — POCT INFLUENZA A/B
Influenza A, POC: NEGATIVE
Influenza B, POC: NEGATIVE

## 2023-07-26 LAB — POC COVID19 BINAXNOW: SARS Coronavirus 2 Ag: NEGATIVE

## 2023-07-26 LAB — POCT RESPIRATORY SYNCYTIAL VIRUS: RSV Rapid Ag: NEGATIVE

## 2023-07-26 MED ORDER — DOXYCYCLINE HYCLATE 100 MG PO TABS: 100.0000 mg | ORAL_TABLET | Freq: Two times a day (BID) | ORAL | 0 refills | Status: DC

## 2023-07-26 MED ORDER — ALBUTEROL SULFATE HFA 108 (90 BASE) MCG/ACT IN AERS: 2.0000 | INHALATION_SPRAY | Freq: Four times a day (QID) | RESPIRATORY_TRACT | 0 refills | Status: DC | PRN

## 2023-07-26 NOTE — Assessment & Plan Note (Signed)
 Chronic Insurance has denied coverage for weight loss medications.  Patient is concerned about being able to afford to healthy weight and wellness center.  Will refer to nutritionist in case insurance will cover evaluation with nutritionist for assistance with weight loss.  In the meantime she was again encouraged to follow lifestyle modification aimed at weight loss.

## 2023-07-26 NOTE — Assessment & Plan Note (Signed)
 Acute Point-of-care COVID, RSV, flu, strep: Negative Symptoms have been present for 1 to 2 weeks, will treat with course of antibiotics.  Patient is allergic to penicillins so we will treat with doxycycline 100 mg twice daily x 10 days.  We discussed risks, potential side effects and what to do if these were to occur.  She reports understanding. She can continue taking Mucinex, hydrating appropriately, cough syrup, and albuterol inhaler as needed for symptom management.

## 2023-07-26 NOTE — Progress Notes (Signed)
 Established Patient Office Visit  Subjective   Patient ID: Rodneshia Greenhouse, female    DOB: [birth year]  Age: 63 y.o. MRN: [id number]  Chief Complaint  Patient presents with   Sore Throat    Pharyngitis: Patient reports for about 1 month she has been sick off and on.  She is been experiencing upper respiratory infection symptoms including congestion, headache, cough, clear thick sputum production.  She reports current symptoms started about 1 week ago.  Has been using Mucinex, cough syrup, and focusing on hydration at home.  Obesity: Continues to struggle with weight loss.  Insurance denied use of injectable GLP-1 agonist or GLP-1-GIP.  Has been unable to lose weight with lifestyle alone.  Has been referred to healthy weight and wellness center but is concerned about financial barriers and being able to follow-up with them routinely.    Review of Systems  Constitutional:  Negative for chills and fever.  Respiratory:  Positive for cough and sputum production. Negative for shortness of breath.   Cardiovascular:  Negative for chest pain and palpitations.  Gastrointestinal:  Negative for diarrhea and vomiting.  Neurological:  Positive for headaches.      Objective:     BP 132/88   Pulse 85   Temp 97.8 F (36.6 C) (Temporal)   Ht 5\' 7"  (1.702 m)   Wt 248 lb (112.5 kg)   SpO2 96%   BMI 38.84 kg/m  BP Readings from Last 3 Encounters:  07/26/23 132/88  06/23/23 132/84  06/13/23 120/82   Wt Readings from Last 3 Encounters:  07/26/23 248 lb (112.5 kg)  06/13/23 245 lb (111.1 kg)  04/26/23 242 lb 8 oz (110 kg)      Physical Exam Vitals reviewed.  Constitutional:      General: She is not in acute distress.    Appearance: Normal appearance.  HENT:     Head: Normocephalic and atraumatic.     Mouth/Throat:     Pharynx: Oropharynx is clear. Uvula midline. No pharyngeal swelling, oropharyngeal exudate or posterior oropharyngeal erythema.  Neck:     Vascular: No  carotid bruit.  Cardiovascular:     Rate and Rhythm: Normal rate and regular rhythm.     Pulses: Normal pulses.     Heart sounds: Normal heart sounds.  Pulmonary:     Effort: Pulmonary effort is normal.     Breath sounds: Normal breath sounds.  Lymphadenopathy:     Cervical: No cervical adenopathy.  Skin:    General: Skin is warm and dry.  Neurological:     General: No focal deficit present.     Mental Status: She is alert and oriented to person, place, and time.  Psychiatric:        Mood and Affect: Mood normal.        Behavior: Behavior normal.        Judgment: Judgment normal.      Results for orders placed or performed in visit on 07/26/23  POC COVID-19 BinaxNow  Result Value Ref Range   SARS Coronavirus 2 Ag Negative Negative  POCT rapid strep A  Result Value Ref Range   Rapid Strep A Screen Negative Negative  POCT Influenza A/B  Result Value Ref Range   Influenza A, POC Negative Negative   Influenza B, POC Negative Negative  POCT respiratory syncytial virus  Result Value Ref Range   RSV Rapid Ag negative       The 10-year ASCVD risk score (Arnett DK, et al., 2019)  is: 7.8%    Assessment & Plan:   Problem List Items Addressed This Visit       Respiratory   Pharyngitis - Primary   Acute Point-of-care COVID, RSV, flu, strep: Negative Symptoms have been present for 1 to 2 weeks, will treat with course of antibiotics.  Patient is allergic to penicillins so we will treat with doxycycline 100 mg twice daily x 10 days.  We discussed risks, potential side effects and what to do if these were to occur.  She reports understanding. She can continue taking Mucinex, hydrating appropriately, cough syrup, and albuterol inhaler as needed for symptom management.      Relevant Medications   doxycycline (VIBRA-TABS) 100 MG tablet   albuterol (VENTOLIN HFA) 108 (90 Base) MCG/ACT inhaler   Other Relevant Orders   Basic metabolic panel with GFR   CBC with  Differential/Platelet   POC COVID-19 BinaxNow (Completed)   POCT rapid strep A (Completed)   POCT Influenza A/B (Completed)   POCT respiratory syncytial virus (Completed)     Other   Obesity (BMI 30-39.9)   Chronic Insurance has denied coverage for weight loss medications.  Patient is concerned about being able to afford to healthy weight and wellness center.  Will refer to nutritionist in case insurance will cover evaluation with nutritionist for assistance with weight loss.  In the meantime she was again encouraged to follow lifestyle modification aimed at weight loss.      Relevant Orders   Amb ref to Medical Nutrition Therapy-MNT    Return in about 6 months (around 01/26/2024) for F/U with Saahir Prude.    Elenore Paddy, NP

## 2023-07-30 ENCOUNTER — Encounter (INDEPENDENT_AMBULATORY_CARE_PROVIDER_SITE_OTHER): Payer: Medicare HMO | Admitting: Physician Assistant

## 2023-08-07 ENCOUNTER — Telehealth: Payer: Self-pay | Admitting: Physical Medicine and Rehabilitation

## 2023-08-07 NOTE — Telephone Encounter (Signed)
 Patient called and wanted to have an xray on her lower back. She said she is in severe pain. CB#[phone]

## 2023-09-04 ENCOUNTER — Telehealth: Payer: Self-pay

## 2023-09-04 NOTE — Telephone Encounter (Signed)
 VOB has been submitted for Durolane, bilateral knee.

## 2023-09-05 ENCOUNTER — Other Ambulatory Visit: Payer: Self-pay

## 2023-09-05 DIAGNOSIS — M1711 Unilateral primary osteoarthritis, right knee: Secondary | ICD-10-CM

## 2023-09-05 DIAGNOSIS — M1712 Unilateral primary osteoarthritis, left knee: Secondary | ICD-10-CM

## 2023-09-12 ENCOUNTER — Encounter (INDEPENDENT_AMBULATORY_CARE_PROVIDER_SITE_OTHER): Payer: Self-pay | Admitting: Physician Assistant

## 2023-09-12 ENCOUNTER — Ambulatory Visit (INDEPENDENT_AMBULATORY_CARE_PROVIDER_SITE_OTHER): Admitting: Physician Assistant

## 2023-09-12 VITALS — BP 148/90 | HR 92 | Temp 98.9°F | Ht 67.0 in | Wt 245.0 lb

## 2023-09-12 DIAGNOSIS — M1990 Unspecified osteoarthritis, unspecified site: Secondary | ICD-10-CM | POA: Diagnosis not present

## 2023-09-12 DIAGNOSIS — Z860100 Personal history of colon polyps, unspecified: Secondary | ICD-10-CM

## 2023-09-12 DIAGNOSIS — E785 Hyperlipidemia, unspecified: Secondary | ICD-10-CM

## 2023-09-12 DIAGNOSIS — E039 Hypothyroidism, unspecified: Secondary | ICD-10-CM | POA: Diagnosis not present

## 2023-09-12 DIAGNOSIS — R7303 Prediabetes: Secondary | ICD-10-CM

## 2023-09-12 DIAGNOSIS — E66812 Obesity, class 2: Secondary | ICD-10-CM

## 2023-09-12 DIAGNOSIS — Z79891 Long term (current) use of opiate analgesic: Secondary | ICD-10-CM | POA: Diagnosis not present

## 2023-09-12 DIAGNOSIS — F32A Depression, unspecified: Secondary | ICD-10-CM

## 2023-09-12 DIAGNOSIS — E559 Vitamin D deficiency, unspecified: Secondary | ICD-10-CM

## 2023-09-12 DIAGNOSIS — F419 Anxiety disorder, unspecified: Secondary | ICD-10-CM

## 2023-09-12 DIAGNOSIS — R600 Localized edema: Secondary | ICD-10-CM

## 2023-09-12 DIAGNOSIS — N393 Stress incontinence (female) (male): Secondary | ICD-10-CM

## 2023-09-12 DIAGNOSIS — I10 Essential (primary) hypertension: Secondary | ICD-10-CM | POA: Diagnosis not present

## 2023-09-12 DIAGNOSIS — F333 Major depressive disorder, recurrent, severe with psychotic symptoms: Secondary | ICD-10-CM

## 2023-09-12 DIAGNOSIS — Z6838 Body mass index (BMI) 38.0-38.9, adult: Secondary | ICD-10-CM | POA: Diagnosis not present

## 2023-09-12 NOTE — Progress Notes (Signed)
 Office: [phone]  /  Fax: [phone]   Initial Visit  Kathleen Mcfarland was seen in clinic today to evaluate for obesity. She is interested in losing weight to improve overall health and reduce the risk of weight related complications. She presents today to review program treatment options, initial physical assessment, and evaluation.     Discussed the use of AI scribe software for clinical note transcription with the patient, who gave verbal consent to proceed.  History of Present Illness Kathleen Mcfarland is a 63 year old female who presents for initial evaluation for obesity treatment.  She has been experiencing challenges with weight management since being diagnosed with osteoarthritis, which has limited her mobility. Her weight has fluctuated, reaching up to 260 pounds, and she was wearing size 22-24 clothing at that time.   She aims to reduce her weight to 200 pounds, with a maximal target of 190 pounds.   She has hypothyroidism and takes thyroid  hormone replacement therapy. She acknowledges that her hormone levels can affect her weight management. She also has prediabetes with a recent A1c of 6.3% in February. Her family history includes rheumatoid arthritis and osteoarthritis, shared with her sister.  Her current medications include a thyroid  hormone pill and vitamin D  supplements. She receives corticosteroid injections in her knees for arthritis management. She reports having good and bad days with her arthritis, affecting her ability to exercise consistently.  She describes herself as an emotional eater with cravings for pastries, especially those with nuts. She has attempted weight loss through detox diets, such as the [employer], which she finds effective but unsustainable. She has also tried intermittent fasting. She has difficulty maintaining weight loss due to limited mobility and arthritis impact.  She has a history of high blood pressure and arthritis  affecting multiple joints. No obstructive sleep apnea, gastroesophageal reflux disease, overactive bladder, or polycystic ovary syndrome. She is cautious about medications that may affect her kidneys or liver.     She was referred by: PCP  When asked what else they would like to accomplish? She states: Adopt healthier eating patterns, Improve energy levels and physical activity, Improve existing medical conditions, Reduce number of medications, Reduce risk for a surgery, Improve quality of life, Improve appearance, Improve self-confidence, and Lose a target amount of weight : Goal of 200 lbs in 9-12 months.  When asked how has your weight affected you? She states: Has affected self-esteem, Relationships, Contributed to medical problems, Contributed to orthopedic problems or mobility issues, Having fatigue, Having poor endurance, Problems with eating patterns, and Has affected mood   Weight history: She has been experiencing challenges with weight management since being diagnosed with osteoarthritis, which has limited her mobility. Her weight has fluctuated, reaching up to 260 pounds, and she was wearing size 22-24 clothing at that time.   She aims to reduce her weight to 200 pounds, with a maximal target of 190 pounds.   She describes herself as an emotional eater with cravings for pastries, especially those with nuts. She has attempted weight loss through detox diets, such as the [employer], which she finds effective but unsustainable. She has also tried intermittent fasting. She has difficulty maintaining weight loss due to limited mobility and arthritis impact.  Some associated conditions: Hypertension, Arthritis:Multiple joints, Hyperlipidemia, Prediabetes, and Vitamin D  Deficiency  Contributing factors: Family history of obesity, Consumption of processed foods, Use of obesogenic medications: Anticholinergics, Moderate to high levels of stress, Reduced physical activity, Eating patterns,  Mental  health problems, Menopause, Strong orexigenic signaling and inadequate inhibitory control , and Slow metabolism for age  Weight promoting medications identified: Anticholinergics  Current nutrition plan: None  Current level of physical activity: Limited due to chronic pain or orthopedic problems  Current or previous pharmacotherapy: None  Response to medication: Never tried medications   Past medical history includes:   Past Medical History:  Diagnosis Date   Allergic rhinitis    Anxiety    Back pain    Cataract    Complication of anesthesia    hard to wake up   Depression    Hypertension    Hypothyroidism    Osteoarthritis 2012   bilateral knees   Osteoarthritis of knee    Osteoporosis      Objective:   BP (!) 148/90   Pulse 92   Temp 98.9 F (37.2 C)   Ht 5\' 7"  (1.702 m)   Wt 245 lb (111.1 kg)   SpO2 100%   BMI 38.37 kg/m  She was weighed on the bioimpedance scale: Body mass index is 38.37 kg/m.  Peak Weight:260 lbs , Body Fat%:49.2%, Visceral Fat Rating:15, Weight trend over the last 12 months: Unchanged  General:  Alert, oriented and cooperative. Patient is in no acute distress.  Respiratory: Normal respiratory effort, no problems with respiration noted   Gait: able to ambulate independently  Mental Status: Normal mood and affect. Normal behavior. Normal judgment and thought content.   DIAGNOSTIC DATA REVIEWED:  BMET    Component Value Date/Time   NA 136 06/13/2023 1216   NA 142 12/22/2019 0913   K 3.8 06/13/2023 1216   CL 101 06/13/2023 1216   CO2 27 06/13/2023 1216   GLUCOSE 120 (H) 06/13/2023 1216   GLUCOSE 121 (H) 04/26/2006 1348   BUN 16 06/13/2023 1216   BUN 11 12/22/2019 0913   CREATININE 0.59 06/13/2023 1216   CREATININE 0.57 12/06/2020 1415   CALCIUM 9.3 06/13/2023 1216   GFRNONAA >60 07/15/2021 1903   GFRNONAA 77 11/30/2014 1144   GFRAA 111 12/22/2019 0913   GFRAA 89 11/30/2014 1144   Lab Results  Component Value Date    HGBA1C 6.3 06/13/2023   HGBA1C 6.0 (H) 01/30/2014   No results found for: "INSULIN " CBC    Component Value Date/Time   WBC 4.8 06/13/2023 1216   RBC 5.23 (H) 06/13/2023 1216   HGB 14.5 06/13/2023 1216   HGB 13.8 12/22/2019 0913   HCT 44.0 06/13/2023 1216   HCT 41.8 12/22/2019 0913   PLT 234.0 06/13/2023 1216   PLT 256 12/22/2019 0913   MCV 84.3 06/13/2023 1216   MCV 86 12/22/2019 0913   MCH 27.9 07/15/2021 1903   MCHC 32.9 06/13/2023 1216   RDW 13.2 06/13/2023 1216   RDW 12.4 12/22/2019 0913   Iron/TIBC/Ferritin/ %Sat    Component Value Date/Time   IRON 90 06/13/2023 1216   FERRITIN 12.6 06/13/2023 1216   Lipid Panel     Component Value Date/Time   CHOL 148 06/13/2023 1216   CHOL 138 08/08/2019 0923   TRIG 154.0 (H) 06/13/2023 1216   HDL 32.50 (L) 06/13/2023 1216   HDL 31 (L) 08/08/2019 0923   CHOLHDL 5 06/13/2023 1216   VLDL 30.8 06/13/2023 1216   LDLCALC 84 06/13/2023 1216   LDLCALC 107 (H) 12/06/2020 1415   Hepatic Function Panel     Component Value Date/Time   PROT 6.3 06/11/2023 1342   PROT 5.9 (L) 12/22/2019 0913   ALBUMIN 3.8 06/11/2023 1342  ALBUMIN 3.7 (L) 12/22/2019 0913   AST 28 06/11/2023 1342   ALT 23 06/11/2023 1342   ALKPHOS 43 06/11/2023 1342   BILITOT 0.5 06/11/2023 1342   BILITOT 0.4 12/22/2019 0913   BILIDIR 0.1 01/03/2011 0844      Component Value Date/Time   TSH 0.47 06/13/2023 1216     Assessment and Plan:   Pre-diabetes  Stress incontinence of urine  History of colonic polyps  Chronic use of opiate for therapeutic purpose  Hypothyroidism (acquired)  Anxiety  Hypertension, unspecified type  Hyperlipidemia, unspecified hyperlipidemia type  Vitamin D  deficiency  Bilateral lower extremity edema  Severe recurrent major depressive disorder with psychotic features (HCC)  Depression, unspecified depression type  Class 2 severe obesity due to excess calories with serious comorbidity and body mass index (BMI) of 38.0  to 38.9 in adult [facility])  Current bmi 38.4  Assessment and Plan Assessment & Plan Obesity Chronic obesity with weight fluctuations. Current weight is 245 lbs with a target of <200 lbs. Limited mobility due to osteoarthritis and hypothyroidism complicates weight loss. Previous attempts include detox diets and interest in pharmacotherapy, but financial constraints and insurance issues limit access. Discussed structured weight management program focusing on sustainable dietary changes and potential pharmacotherapy options.  She is very interested in exploring injectables for weight loss.   Consideration of alternative medications if injections are not feasible/cost prohibitive. - Enroll in weight management program focusing on sustainable dietary changes. - Perform initial testing including labs and metabolism test. - Discuss potential pharmacotherapy options considering financial constraints and insurance coverage. - Consider alternative medications if injections are not feasible. - Schedule follow-up appointments every 2-3 weeks for the first 3 months, then monthly.  Osteoarthritis Chronic osteoarthritis affecting multiple joints, causing significant mobility limitations. Previous treatments include corticosteroid injections. Condition impacts physical activity, contributing to weight management challenges. Weight loss may improve joint health and mobility. - Continue management with orthopedic specialist. - Consider potential benefits of weight loss on joint health and mobility.  Hypothyroidism Chronic hypothyroidism managed with thyroid  hormone replacement therapy. Acknowledged impact on weight management and need for regular monitoring of thyroid  function for optimal dosing. - Continue thyroid  hormone replacement therapy. - Monitor thyroid  function tests regularly.  Hypertension Hypertension potentially influenced by obesity and limited physical activity. Discussed potential benefits of  weight loss on blood pressure control. - Review current antihypertensive medications.  Prediabetes Prediabetes with recent hemoglobin A1c of 6.3%. Discussed risk of progression to type 2 diabetes and potential impact of weight loss and dietary changes on glycemic control. A hemoglobin A1c of 6.5% or above would indicate type 2 diabetes, facilitating access to certain medications at a more reasonable cost. - Monitor hemoglobin A1c levels regularly. - Implement dietary changes as part of the weight management program to improve glycemic control.        Obesity Treatment / Action Plan:  Patient will work on garnering support from family and friends to begin weight loss journey. Will work on eliminating or reducing the presence of highly palatable, calorie dense foods in the home. Will complete provided nutritional and psychosocial assessment questionnaire before the next appointment. Will be scheduled for indirect calorimetry to determine resting energy expenditure in a fasting state.  This will allow us  to create a reduced calorie, high-protein meal plan to promote loss of fat mass while preserving muscle mass. Will avoid skipping meals which may result in increased hunger signals and overeating at certain times. Will work on managing stress via relaxation methods as  this may result in unhealthy eating patterns. Will work on reading labels, making healthier choices and watching portion sizes. Counseled on the health benefits of losing 5%-15% of total body weight. Will work on improving sleep hygiene and trying to obtain at least 7 hours of sleep. Was counseled on nutritional approaches to weight loss and benefits of reducing processed foods and consuming plant-based foods and high quality protein as part of nutritional weight management. Was counseled on pharmacotherapy and role as an adjunct in weight management.   Obesity Education Performed Today:  She was weighed on the bioimpedance  scale and results were discussed and documented in the synopsis.  We discussed obesity as a disease and the importance of a more detailed evaluation of all the factors contributing to the disease.  We discussed the importance of long term lifestyle changes which include nutrition, exercise and behavioral modifications as well as the importance of customizing this to her specific health and social needs.  We discussed the benefits of reaching a healthier weight to alleviate the symptoms of existing conditions and reduce the risks of the biomechanical, metabolic and psychological effects of obesity.  Kathleen Mcfarland appears to be in the action stage of change and states they are ready to start intensive lifestyle modifications and behavioral modifications.  I have spent 30 minutes in the care of the patient today including: preparing to see patient (e.g. review and interpretation of tests, old notes ), obtaining and/or reviewing separately obtained history, performing a medically appropriate examination or evaluation, counseling and educating the patient, documenting clinical information in the electronic or other health care record, and independently interpreting results and communicating results to the patient, family, or caregiver   Reviewed by clinician on day of visit: allergies, medications, problem list, medical history, surgical history, family history, social history, and previous encounter notes pertinent to obesity diagnosis.   Milly Goggins,PA-C

## 2023-09-18 ENCOUNTER — Telehealth: Payer: Self-pay | Admitting: Orthopedic Surgery

## 2023-09-25 ENCOUNTER — Telehealth: Payer: Self-pay | Admitting: Neurology

## 2023-09-25 NOTE — Telephone Encounter (Signed)
 At 11"30 pt left vm with request to cx appointment

## 2023-09-26 ENCOUNTER — Ambulatory Visit: Payer: Medicare HMO | Admitting: Neurology

## 2023-09-27 DIAGNOSIS — Z0289 Encounter for other administrative examinations: Secondary | ICD-10-CM

## 2023-10-03 ENCOUNTER — Ambulatory Visit
Admission: RE | Admit: 2023-10-03 | Discharge: 2023-10-03 | Disposition: A | Discharge status: Home / Self Care | Payer: Medicare HMO | Source: Ambulatory Visit | Attending: Internal Medicine | Admitting: Internal Medicine

## 2023-10-03 DIAGNOSIS — Z1231 Encounter for screening mammogram for malignant neoplasm of breast: Secondary | ICD-10-CM

## 2023-10-10 ENCOUNTER — Encounter: Payer: Self-pay | Admitting: Orthopedic Surgery

## 2023-10-10 ENCOUNTER — Ambulatory Visit: Admitting: Orthopedic Surgery

## 2023-10-10 DIAGNOSIS — M170 Bilateral primary osteoarthritis of knee: Secondary | ICD-10-CM | POA: Diagnosis not present

## 2023-10-10 DIAGNOSIS — M1711 Unilateral primary osteoarthritis, right knee: Secondary | ICD-10-CM

## 2023-10-10 DIAGNOSIS — M1712 Unilateral primary osteoarthritis, left knee: Secondary | ICD-10-CM

## 2023-10-10 MED ORDER — SODIUM HYALURONATE 60 MG/3ML IX PRSY
60.0000 mg | PREFILLED_SYRINGE | INTRA_ARTICULAR | Status: AC | PRN
  Administered 2023-10-10: 60 mg via INTRA_ARTICULAR

## 2023-10-10 NOTE — Progress Notes (Signed)
   Procedure Note  Patient: Kathleen Mcfarland             Date of Birth: 17-Sep-1960           MRN: [id number]             Visit Date: 10/10/2023  Procedures: Visit Diagnoses: No diagnosis found.  Large Joint Inj: bilateral knee on 10/10/2023 5:01 PM Indications: diagnostic evaluation, joint swelling and pain Details: 18 G 1.5 in needle, superolateral approach  Arthrogram: No  Medications (Right): 60 mg Sodium Hyaluronate 60 MG/3ML Medications (Left): 60 mg Sodium Hyaluronate 60 MG/3ML Outcome: tolerated well, no immediate complications Procedure, treatment alternatives, risks and benefits explained, specific risks discussed. Consent was given by the patient. Immediately prior to procedure a time out was called to verify the correct patient, procedure, equipment, support staff and site/side marked as required. Patient was prepped and draped in the usual sterile fashion.

## 2023-10-12 ENCOUNTER — Other Ambulatory Visit: Payer: Self-pay | Admitting: Nurse Practitioner

## 2023-10-12 DIAGNOSIS — I10 Essential (primary) hypertension: Secondary | ICD-10-CM

## 2023-10-12 DIAGNOSIS — F419 Anxiety disorder, unspecified: Secondary | ICD-10-CM

## 2023-10-12 NOTE — Telephone Encounter (Signed)
 Copied from CRM [id number]. Topic: Clinical - Medication Refill >> Oct 12, 2023  8:32 AM Freya Jesus wrote: Medication: triamterene -hydrochlorothiazide  (MAXZIDE -25) 37.5-25 MG tablet [[id number]]  Has the patient contacted their pharmacy? No (Agent: If no, request that the patient contact the pharmacy for the refill. If patient does not wish to contact the pharmacy document the reason why and proceed with request.) (Agent: If yes, when and what did the pharmacy advise?)  This is the patient's preferred pharmacy:  [facility] [address] [place], [place] - [address] [place] [address] Phone: [phone] Fax: [phone]  Is this the correct pharmacy for this prescription? Yes If no, delete pharmacy and type the correct one.   Has the prescription been filled recently? No  Is the patient out of the medication? Yes  Has the patient been seen for an appointment in the last year OR does the patient have an upcoming appointment? Yes  Can we respond through MyChart? Yes  Agent: Please be advised that Rx refills may take up to 3 business days. We ask that you follow-up with your pharmacy.

## 2023-10-15 MED ORDER — TRIAMTERENE-HCTZ 37.5-25 MG PO TABS: ORAL_TABLET | ORAL | 0 refills | Status: DC

## 2023-10-18 ENCOUNTER — Ambulatory Visit

## 2023-11-01 ENCOUNTER — Ambulatory Visit: Admitting: Surgical

## 2023-11-07 ENCOUNTER — Other Ambulatory Visit: Payer: Self-pay | Admitting: Nurse Practitioner

## 2023-11-07 DIAGNOSIS — E039 Hypothyroidism, unspecified: Secondary | ICD-10-CM

## 2023-11-07 NOTE — Telephone Encounter (Signed)
 Copied from CRM [id number]. Topic: Clinical - Medication Refill >> Nov 07, 2023  4:58 PM Taleah C wrote: Medication: Synthoid  Has the patient contacted their pharmacy? Yes She stated she has no more refills.   This is the patient's preferred pharmacy:  [facility] [address] [place], [place] - [address] [place] [address] Phone: [phone] Fax: [phone]  Is this the correct pharmacy for this prescription? Yes If no, delete pharmacy and type the correct one.   Has the prescription been filled recently? No  Is the patient out of the medication? No, 2 pills left  Has the patient been seen for an appointment in the last year OR does the patient have an upcoming appointment? No  Can we respond through MyChart? Yes  Agent: Please be advised that Rx refills may take up to 3 business days. We ask that you follow-up with your pharmacy.

## 2023-11-09 MED ORDER — SYNTHROID 200 MCG PO TABS: ORAL_TABLET | ORAL | 1 refills | Status: DC

## 2023-11-20 ENCOUNTER — Encounter (INDEPENDENT_AMBULATORY_CARE_PROVIDER_SITE_OTHER): Payer: Self-pay

## 2023-11-21 ENCOUNTER — Other Ambulatory Visit (INDEPENDENT_AMBULATORY_CARE_PROVIDER_SITE_OTHER): Payer: Self-pay

## 2023-11-21 ENCOUNTER — Ambulatory Visit: Admitting: Orthopedic Surgery

## 2023-11-21 ENCOUNTER — Encounter: Payer: Self-pay | Admitting: Orthopedic Surgery

## 2023-11-21 VITALS — Ht 67.0 in | Wt 251.0 lb

## 2023-11-21 DIAGNOSIS — M25562 Pain in left knee: Secondary | ICD-10-CM

## 2023-11-21 DIAGNOSIS — M25561 Pain in right knee: Secondary | ICD-10-CM

## 2023-11-21 NOTE — Progress Notes (Signed)
 Office Visit Note   Patient: Kathleen Mcfarland           Date of Birth: [birth year]           MRN: [id number] Visit Date: 11/21/2023 Requested by: Elnor Lauraine BRAVO, NP [address] [place],  [place] [address] PCP: Elnor Lauraine BRAVO, NP  Subjective: Chief Complaint  Patient presents with   Other    Bilateral knee pain-patient requesting new xrays-no relief from prior injections on 10/10/23    HPI: Chrishonda Hesch is a 63 y.o. female who presents to the office reporting left greater than right knee pain.  Both knees hurt.  She had injections done 10/10/2023.  Her pain is gradually getting worse.  She is using a cane.  Taking ibuprofen  which helps some.  She is not working.  She is disabled.  Pain keeps her awake at night.  She lives alone but has a daughter and son in town.  No personal or family history of DVT or pulmonary embolism.  She lives in a two-story house..                ROS: All systems reviewed are negative as they relate to the chief complaint within the history of present illness.  Patient denies fevers or chills.  Assessment & Plan: Visit Diagnoses:  1. Pain in both knees, unspecified chronicity     Plan: Impression is worsening bilateral knee arthritis worse on the left-hand side.  He is close to requiring total knee replacement.  BMI currently is 39.31.  Copies of radiographs provided.  She is going to consider her options and let me know if she wants to proceed with knee replacement.  Nonweightbearing quad strengthening exercises encouraged.  Follow-Up Instructions: No follow-ups on file.   Orders:  Orders Placed This Encounter  Procedures   XR KNEE 3 VIEW RIGHT   XR Knee 1-2 Views Left   No orders of the defined types were placed in this encounter.     Procedures: No procedures performed   Clinical Data: No additional findings.  Objective: Vital Signs: Ht 5' 7 (1.702 m)   Wt 251 lb (113.9 kg)   BMI 39.31 kg/m   Physical Exam:   Constitutional: Patient appears well-developed HEENT:  Head: Normocephalic Eyes:EOM are normal Neck: Normal range of motion Cardiovascular: Normal rate Pulmonary/chest: Effort normal Neurologic: Patient is alert Skin: Skin is warm Psychiatric: Patient has normal mood and affect  Ortho Exam: Ortho exam demonstrates slightly antalgic gait to the left.  Patient uses a cane.  No effusion in either knee.  Range of motion on the right is 5-100.  Range of motion on the left is 10-100.  Pedal pulses palpable.  No groin pain with internal or external rotation of the leg.  Collateral cruciate ligaments are stable.  No other masses lymphadenopathy or skin changes noted in the left knee or right knee region. Specialty Comments:  Prefers Dr. Addie for gel Injection  Imaging: XR KNEE 3 VIEW RIGHT Result Date: 11/21/2023 AP lateral merchant radiographs right knee reviewed.  Severe end-stage tricompartmental arthritis is present.  No acute fracture.  Mild varus alignment.  XR Knee 1-2 Views Left Result Date: 11/21/2023 AP lateral merchant radiographs left knee reviewed.  Severe end-stage tricompartmental arthritis is present with slight varus alignment. No fracture    PMFS History: Patient Active Problem List   Diagnosis Date Noted   Pharyngitis 07/26/2023   Memory change 06/13/2023   Hyperlipidemia 06/13/2023  Anxiety 04/26/2023   Bouchard's node 09/08/2022   Hypertensive disorder 05/10/2021   Lymphadenopathy 03/11/2020   Neck mass 03/11/2020   Trigger finger 02/18/2020   Trigger thumb of right hand 01/15/2020   Vitamin D  deficiency 08/08/2019   Bilateral lower extremity edema 08/08/2019   Pre-diabetes 06/04/2017   Menopausal syndrome (hot flashes) 06/04/2017   History of colonic polyps 06/04/2017   Chronic use of opiate for therapeutic purpose 07/03/2016   Disorder of rotator cuff of both shoulders 05/19/2015   Obesity (BMI 30-39.9) 05/19/2015   Severe recurrent major depressive  disorder with psychotic features (HCC) 01/03/2014   Depression 01/02/2014   Urinary incontinence 07/21/2010   HYPERTENSION, BENIGN ESSENTIAL 07/31/2006   Hypothyroidism (acquired) 06/23/2006   Seasonal allergies 06/23/2006   Osteoarthrosis involving lower leg 06/23/2006   Past Medical History:  Diagnosis Date   Allergic rhinitis    Anxiety    Back pain    Cataract    Complication of anesthesia    hard to wake up   Depression    Hypertension    Hypothyroidism    Osteoarthritis 2012   bilateral knees   Osteoarthritis of knee    Osteoporosis     Family History  Problem Relation Age of Onset   Diabetes Mother    Hypertension Mother    Coronary artery disease Father 36       heart disease diagnoised   Hypertension Father    Heart disease Father    Colon cancer Father        unknown age   Diabetes Father    Hyperlipidemia Father    Arthritis Sister    Hypertension Sister    Diabetes Sister    Colon polyps Sister    Cervical cancer Sister    Other Sister        uterine fibroids   Hypertension Sister    Diabetes Sister    Colon polyps Sister    Colon polyps Brother    Colon cancer Maternal Grandmother    Ovarian cancer Maternal Grandmother    Dislocations Maternal Grandmother    Cirrhosis Maternal Grandfather    Heart disease Paternal Grandmother    Coronary artery disease Paternal Grandmother    Cancer Paternal Grandmother    Colon cancer Other        great uncle at 64, great aunt dx at 28, GM age 63   Colon polyps Other        2 sisters dx at age 30 and 32   Breast cancer Neg Hx    Stroke Neg Hx     Past Surgical History:  Procedure Laterality Date   COLONOSCOPY  09/05/2016   Dr.Jacobs   DILATION AND CURETTAGE OF UTERUS     ROTATOR CUFF REPAIR     SHOULDER ARTHROSCOPY  04/13/2011   Procedure: ARTHROSCOPY SHOULDER;  Surgeon: Toribio JULIANNA Chancy, MD;  Location: [facility];  Service: Orthopedics;  Laterality: Right;  Right Shoulder Arthroscopy  with Acrmioploasty, Arhtroscopic rotator cuff repair and Microfracture humeral head   SHOULDER ARTHROSCOPY WITH ROTATOR CUFF REPAIR Right 07/04/2012   Procedure: SHOULDER ARTHROSCOPY WITH ROTATOR CUFF REPAIR;  Surgeon: Toribio JULIANNA Chancy, MD;  Location: [facility];  Service: Orthopedics;  Laterality: Right;  RIGHT SHOULDER ARTHROSCOPY WITH ARTHROSCOPIC ROTATOR CUFF REPAIR, LYSIS OF ADHESIONS   TUBAL LIGATION     Social History   Occupational History   Occupation: None  Tobacco Use   Smoking status: Never   Smokeless tobacco: Never  Vaping Use   Vaping status: Never Used  Substance and Sexual Activity   Alcohol use: Yes    Comment: glass of wine once a month   Drug use: No   Sexual activity: Yes    Birth control/protection: Other-see comments    Comment: BTL

## 2023-11-22 ENCOUNTER — Ambulatory Visit

## 2023-11-28 ENCOUNTER — Encounter (INDEPENDENT_AMBULATORY_CARE_PROVIDER_SITE_OTHER): Payer: Self-pay | Admitting: Internal Medicine

## 2023-11-28 ENCOUNTER — Ambulatory Visit (INDEPENDENT_AMBULATORY_CARE_PROVIDER_SITE_OTHER): Admitting: Internal Medicine

## 2023-11-28 VITALS — BP 134/82 | HR 78 | Temp 98.1°F | Ht 67.0 in | Wt 249.0 lb

## 2023-11-28 DIAGNOSIS — Z6838 Body mass index (BMI) 38.0-38.9, adult: Secondary | ICD-10-CM | POA: Diagnosis not present

## 2023-11-28 DIAGNOSIS — E7800 Pure hypercholesterolemia, unspecified: Secondary | ICD-10-CM | POA: Diagnosis not present

## 2023-11-28 DIAGNOSIS — R7303 Prediabetes: Secondary | ICD-10-CM

## 2023-11-28 DIAGNOSIS — Z1331 Encounter for screening for depression: Secondary | ICD-10-CM

## 2023-11-28 DIAGNOSIS — G4733 Obstructive sleep apnea (adult) (pediatric): Secondary | ICD-10-CM | POA: Insufficient documentation

## 2023-11-28 DIAGNOSIS — R29818 Other symptoms and signs involving the nervous system: Secondary | ICD-10-CM | POA: Insufficient documentation

## 2023-11-28 DIAGNOSIS — R5383 Other fatigue: Secondary | ICD-10-CM

## 2023-11-28 DIAGNOSIS — F509 Eating disorder, unspecified: Secondary | ICD-10-CM

## 2023-11-28 DIAGNOSIS — I10 Essential (primary) hypertension: Secondary | ICD-10-CM | POA: Diagnosis not present

## 2023-11-28 DIAGNOSIS — E66812 Obesity, class 2: Secondary | ICD-10-CM

## 2023-11-28 DIAGNOSIS — R0602 Shortness of breath: Secondary | ICD-10-CM | POA: Diagnosis not present

## 2023-11-28 DIAGNOSIS — F5089 Other specified eating disorder: Secondary | ICD-10-CM

## 2023-11-28 NOTE — Assessment & Plan Note (Signed)
 Check fasting lipid profile assess cardiovascular risk.

## 2023-11-28 NOTE — Progress Notes (Signed)
 [address] [place],  [place], [place] [address]  Office: [phone]  /  Fax: [phone]   Subjective   Initial Visit  Kathleen Mcfarland (MR# [id number]) is a 63 y.o. female who presents for evaluation and treatment of obesity and related comorbidities. Current BMI is Body mass index is 39 kg/m. Kathleen Mcfarland has been struggling with her weight for many years and has been unsuccessful in either losing weight, maintaining weight loss, or reaching her healthy weight goal.  Kathleen Mcfarland is currently in the action stage of change and ready to dedicate time achieving and maintaining a healthier weight. Kathleen Mcfarland is interested in becoming our patient and working on intensive lifestyle modifications including (but not limited to) diet and exercise for weight loss.        Weight history: Discussed the use of AI scribe software for clinical note transcription with the patient, who gave verbal consent to proceed.  History of Present Illness   Kathleen Mcfarland is a 63 year old female with osteoarthritis and obesity who presents for medical weight management.  She has been experiencing weight issues since being diagnosed with osteoarthritis, which has significantly limited her mobility. Her weight has reached 260 pounds, and she aims to reduce it to 200 pounds. She has attempted weight loss through dietary changes, including a 10-12 day detox with [employer], resulting in a 7-pound weight loss. She continues to incorporate a green smoothie into her daily routine.  She has a history of hypertension, hypothyroidism, major depression, prediabetes, and hyperlipidemia. Her prediabetes was noted with an A1c of 6.3 in February, which was not a fasting test. She is not currently on obesogenic medications. She has mild sleep apnea and has declined the use of a CPAP machine, preferring to manage her condition through weight loss.  Her family history includes cardiometabolic diseases, cancer, and  obesity.  Socially, she is divorced and lives alone. 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 Due to osteoarthritis in both knees, she engages in sit-down exercises and plans to resume water aerobics.  Nutritionally, she has reduced her intake of processed foods and avoids eating out frequently. She craves barbecue and bacon but has eliminated them from her diet. She practices intermittent fasting and consumes healthy snacks like peanuts and popcorn. She avoids soda and drinks black coffee. Her dietary weaknesses include sweets, baked pastas, and breads, which she has reduced due to their high calorie content.  She experiences emotional eating, particularly when stressed, sad, or bored, but does not report uncontrollable hunger. Her sleep quality may be inadequate, as suggested by an Epworth score of 12, and she sometimes wakes up hungry at night.     Past medical history includes:   Past Medical History:  Diagnosis Date   Allergic rhinitis    Anxiety    B12 deficiency    Back pain    Back pain    Cataract    Complication of anesthesia    hard to wake up   Depression    Diabetes (HCC)    Edema of both lower extremities    Glaucoma    Hypertension    Hypothyroidism    Joint pain    Lactose intolerance    Osteoarthritis 2012   bilateral knees   Osteoarthritis of knee    Osteoporosis    Vitamin D  deficiency      Objective   BP 134/82   Pulse 78  Temp 98.1 F (36.7 C)   Ht 5' 7 (1.702 m)   Wt 249 lb (112.9 kg)   SpO2 96%   BMI 39.00 kg/m  She was weighed on the bioimpedance scale: Body mass index is 39 kg/m.    Anthropometrics:  Vitals Temp: 98.1 F (36.7 C) BP: 134/82 Pulse Rate: 78 SpO2: 96 %   Anthropometric Measurements Height: 5' 7 (1.702 m) Weight: 249 lb (112.9 kg) BMI (Calculated): 38.99 Starting Weight: 249 lb Peak Weight: 257 lb Waist Measurement : 50  inches   Body Composition  Body Fat %: 50.4 % Fat Mass (lbs): 125.6 lbs Muscle Mass (lbs): 117.6 lbs Total Body Water (lbs): 87.6 lbs Visceral Fat Rating : 16   Other Clinical Data RMR: [id number] Fasting: yes Labs: yes Today's Visit #: 1 Starting Date: 11/28/23    Physical Exam:  General: She is overweight, cooperative, alert, well developed, and in no acute distress. PSYCH: Has normal mood, affect and thought process.   HEENT: EOMI, sclerae are anicteric. Lungs: Normal breathing effort, no conversational dyspnea. Extremities: No edema.  Neurologic: No gross sensory or motor deficits. No tremors or fasciculations noted.    Diagnostic Data Reviewed  EKG: Normal sinus rhythm, rate 77 bpm. No conduction abnormalities, abnormal Q waves or chamber enlargement.  Indirect Calorimeter completed today shows a VO2 of 268 and a REE of 1843.  Her calculated basal metabolic rate is 8232 thus her resting energy expenditure faster than calculated.  Depression Screen  Kathleen Mcfarland's PHQ-9 score was: 4.     11/28/2023    9:52 AM  Depression screen PHQ 2/9  Decreased Interest 2  Down, Depressed, Hopeless 0  PHQ - 2 Score 2  Altered sleeping 0  Tired, decreased energy 2  Change in appetite 0  Feeling bad or failure about yourself  0  Trouble concentrating 0  Moving slowly or fidgety/restless 0  Suicidal thoughts 0  PHQ-9 Score 4  Difficult doing work/chores Somewhat difficult    Screening for Sleep Related Breathing Disorders  Kathleen Mcfarland admits to daytime somnolence and admits to waking up still tired. Patient has a history of symptoms of daytime fatigue and morning headache. Kathleen Mcfarland generally gets 7 hours of sleep per night, and states that she sleeps well sometimes. Snoring is present. Apneic episodes are present. Epworth Sleepiness Score is 12.   BMET    Component Value Date/Time   NA 136 06/13/2023 1216   NA 142 12/22/2019 0913   K 3.8 06/13/2023 1216   CL 101 06/13/2023 1216    CO2 27 06/13/2023 1216   GLUCOSE 120 (H) 06/13/2023 1216   GLUCOSE 121 (H) 04/26/2006 1348   BUN 16 06/13/2023 1216   BUN 11 12/22/2019 0913   CREATININE 0.59 06/13/2023 1216   CREATININE 0.57 12/06/2020 1415   CALCIUM 9.3 06/13/2023 1216   GFRNONAA >60 07/15/2021 1903   GFRNONAA 77 11/30/2014 1144   GFRAA 111 12/22/2019 0913   GFRAA 89 11/30/2014 1144   Lab Results  Component Value Date   HGBA1C 6.3 06/13/2023   HGBA1C 6.0 (H) 01/30/2014   No results found for: INSULIN  CBC    Component Value Date/Time   WBC 4.8 06/13/2023 1216   RBC 5.23 (H) 06/13/2023 1216   HGB 14.5 06/13/2023 1216   HGB 13.8 12/22/2019 0913   HCT 44.0 06/13/2023 1216   HCT 41.8 12/22/2019 0913   PLT 234.0 06/13/2023 1216   PLT 256 12/22/2019 0913   MCV 84.3 06/13/2023 1216   MCV 86  12/22/2019 0913   MCH 27.9 07/15/2021 1903   MCHC 32.9 06/13/2023 1216   RDW 13.2 06/13/2023 1216   RDW 12.4 12/22/2019 0913   Iron/TIBC/Ferritin/ %Sat    Component Value Date/Time   IRON 90 06/13/2023 1216   FERRITIN 12.6 06/13/2023 1216   Lipid Panel     Component Value Date/Time   CHOL 148 06/13/2023 1216   CHOL 138 08/08/2019 0923   TRIG 154.0 (H) 06/13/2023 1216   HDL 32.50 (L) 06/13/2023 1216   HDL 31 (L) 08/08/2019 0923   CHOLHDL 5 06/13/2023 1216   VLDL 30.8 06/13/2023 1216   LDLCALC 84 06/13/2023 1216   LDLCALC 107 (H) 12/06/2020 1415   Hepatic Function Panel     Component Value Date/Time   PROT 6.3 06/11/2023 1342   PROT 5.9 (L) 12/22/2019 0913   ALBUMIN 3.8 06/11/2023 1342   ALBUMIN 3.7 (L) 12/22/2019 0913   AST 28 06/11/2023 1342   ALT 23 06/11/2023 1342   ALKPHOS 43 06/11/2023 1342   BILITOT 0.5 06/11/2023 1342   BILITOT 0.4 12/22/2019 0913   BILIDIR 0.1 01/03/2011 0844      Component Value Date/Time   TSH 0.47 06/13/2023 1216     Assessment and Plan   TREATMENT PLAN FOR OBESITY:  Recommended Dietary Goals  Eeva is currently in the action stage of change. As such, her  goal is to implement medically supervised obesity management plan.  She has agreed to implement: keeping a food journal with a target of  1300 kcal per day and 30-40 grams of protein per meal  Behavioral Intervention  We discussed the following Behavioral Modification Strategies today: increasing lean protein intake to established goals, decreasing simple carbohydrates , increasing vegetables, increasing lower glycemic fruits, increasing fiber rich foods, avoiding skipping meals, increasing water intake, work on meal planning and preparation, reading food labels , keeping healthy foods at home, identifying sources and decreasing liquid calories, decreasing eating out or consumption of processed foods, and making healthy choices when eating convenient foods, planning for success, and better snacking choices  Additional resources provided today: Handout on healthy eating and balanced plate, Handout on complex carbohydrates and lean sources of protein, and Category 2 packet  Recommended Physical Activity Goals  Kathleen Mcfarland has been advised to work up to 150 minutes of moderate intensity aerobic activity a week and strengthening exercises 2-3 times per week for cardiovascular health, weight loss maintenance and preservation of muscle mass.   She has agreed to :  Think about enjoyable ways to increase daily physical activity and overcoming barriers to exercise and Increase physical activity in their day and reduce sedentary time (increase NEAT).  Medical Interventions and Pharmacotherapy We will work on building a Therapist, art and behavioral strategies. We will discuss the role of pharmacotherapy as an adjunct at subsequent visits.   ASSOCIATED CONDITIONS ADDRESSED TODAY  Other Fatigue Kathleen Mcfarland admits to daytime somnolence and admits to waking up still tired. Patient has a history of symptoms of daytime fatigue and morning headache. Kathleen Mcfarland generally gets 7 hours of sleep per  night, and states that she sleeps well sometimes. Snoring is present. Apneic episodes are present. Epworth Sleepiness Score is 12. Kathleen Mcfarland does feel that her weight is causing her energy to be lower than it should be. Fatigue may be related to obesity, depression or many other causes. Labs will be ordered, and in the meanwhile, Kathleen Mcfarland will focus on self care including making healthy food choices, increasing physical activity and focusing  on stress reduction.  Shortness of Breath Kathleen Mcfarland notes increasing shortness of breath with physical activity and seems to be worsening over time with weight gain. She notes getting out of breath sooner with activity than she used to. This has not gotten worse recently. Kathleen Mcfarland denies shortness of breath at rest or orthopnea.  Other fatigue -     EKG 12-Lead  Depression screen  SOB (shortness of breath) on exertion  HYPERTENSION, BENIGN ESSENTIAL Assessment & Plan: Blood pressure close to goal on triamterene  hydrochlorothiazide .  We will check renal parameters today losing 10% of body weight may improve blood pressure control  Orders: -     EKG 12-Lead -     Vitamin B12 -     CBC with Differential/Platelet -     Comprehensive metabolic panel with GFR -     Hemoglobin A1c -     Insulin , random -     TSH -     VITAMIN D  25 Hydroxy (Vit-D Deficiency, Fractures) -     Lipid Panel With LDL/HDL Ratio  Pre-diabetes Assessment & Plan: Most recent A1c is  Lab Results  Component Value Date   HGBA1C 6.3 06/13/2023   HGBA1C 6.0 (H) 01/30/2014    Patient aware of disease state and risk of progression. This may contribute to abnormal cravings, fatigue and diabetic complications without having diabetes.   We have discussed treatment options which include: losing 7 to 10% of body weight, increasing physical activity to a goal of 150 minutes a week at moderate intensity.  Advised to maintain a diet low on simple and processed carbohydrates.  She may also  be a candidate for pharmacoprophylaxis with metformin  or incretin mimetic.  Had been on Ozempic in the past but discontinued medication due to adverse effects.  Unlikely due to not having nutritional plan.   Orders: -     Hemoglobin A1c -     Insulin , random  Class 2 severe obesity due to excess calories with serious comorbidity and body mass index (BMI) of 38.0 to 38.9 in adult [facility]) Assessment & Plan: Contributing factors: Low volume with physical activity, high body fat percentage, untreated OSA, menopause, family history, impaired mobility due to orthopedic problems  Obesity with a current weight of 257 pounds, aiming to reduce to 200 pounds. Weight issues exacerbated by osteoarthritis limiting mobility. Engaged in a weight management program focusing on sustainable weight loss of 1-2 pounds per week. Emphasis on nutrition therapy, physical activity as tolerated, and addressing emotional eating. Discussed the importance of a calorie deficit and the role of protein in appetite suppression and muscle maintenance. - Implement a 1300 calorie per day diet to create a calorie deficit. - Follow a structured meal plan with breakfast, lunch, and dinner, including 300 calories for snacks if needed. - Emphasize reducing processed foods and increasing intake of whole foods, fruits, and vegetables. - Incorporate protein in each meal to aid in appetite suppression and muscle maintenance. - Encourage physical activity as tolerated, including water aerobics starting August 4. - Address emotional eating with potential referral to a specialist if needed.  Orders: -     EKG 12-Lead -     Vitamin B12 -     CBC with Differential/Platelet -     Comprehensive metabolic panel with GFR -     Hemoglobin A1c -     Insulin , random -     TSH -     VITAMIN D  25 Hydroxy (Vit-D Deficiency, Fractures) -  Lipid Panel With LDL/HDL Ratio  Pure hypercholesterolemia Assessment & Plan: Check fasting lipid profile  assess cardiovascular risk.   OSA (obstructive sleep apnea) -untreated-unknown severity Assessment & Plan: Mild obstructive sleep apnea per patient, previously untreated due to concerns about CPAP dependency. Discussed the bidirectional relationship between sleep apnea and weight gain, and the potential health risks of untreated sleep apnea. - Review previous sleep studies in upcoming visits to assess the severity and discuss potential treatment options. -Losing 10 to 15% of body weight may reduce AHI -Consider rechallenging with GLP-1 - Review sleep study   Eating disorder, unspecified type -emotional eating    General Health Maintenance General health maintenance discussed in the context of weight management and reducing risk of complications. Emphasized the importance of a balanced diet, regular physical activity, and routine health screenings. - Order comprehensive blood work including thyroid  function, B12, insulin  levels, and A1c. - Assess cardiovascular and cancer risk based on blood work results.  Follow-up Follow-up plans discussed to monitor progress and adjust treatment as needed. - Schedule follow-up appointment to review blood work results and assess progress in weight management.       Follow-up  She was informed of the importance of frequent follow-up visits to maximize her success with intensive lifestyle modifications for her multiple health conditions. She was informed we would discuss her lab results at her next visit unless there is a critical issue that needs to be addressed sooner. Kathleen Mcfarland agreed to keep her next visit at the agreed upon time to discuss these results.  Attestation Statement  This is the patient's intake visit at [facility]. The patient's Health Questionnaire was reviewed at length. Included in the packet: current and past health history, medications, allergies, ROS, gynecologic history (women only), surgical history, family  history, social history, weight history, weight loss surgery history (for those that have had weight loss surgery), nutritional evaluation, mood and food questionnaire, PHQ9, Epworth questionnaire, sleep habits questionnaire, patient life and health improvement goals questionnaire. These will all be scanned into the patient's chart under media.   During the visit, I independently reviewed the patient's EKG, previous labs, bioimpedance scale results, and indirect calorimetry results. I used this information to medically tailor a meal plan for the patient that will help her to lose weight and will improve her obesity-related conditions. I performed a medically necessary appropriate examination and/or evaluation. I discussed the assessment and treatment plan with the patient. The patient was provided an opportunity to ask questions and all were answered. The patient agreed with the plan and demonstrated an understanding of the instructions. Labs were ordered at this visit and will be reviewed at the next visit unless critical results need to be addressed immediately. Clinical information was updated and documented in the EMR.   In addition, they received basic education on identification of processed foods and reduction of these, different sources of lean proteins and complex carbohydrates and how to eat balanced by incorporation of whole foods.  Reviewed by clinician on day of visit: allergies, medications, problem list, medical history, surgical history, family history, social history, and previous encounter notes.  I have spent 61 minutes in the care of the patient today including: 6 minutes before the visit reviewing and preparing the chart. 40 minutes face-to-face assessing and reviewing listed medical problems as outlined in obesity care plan, providing nutritional and behavioral counseling on topics outlined in the obesity care plan, counseling regarding anti-obesity medication as outlined in obesity  care plan, independently  interpreting test results and goals of care, as described in assessment and plan, reviewing and discussing biometric information and progress, and ordering diagnostics - see orders 15 minutes after the visit updating chart and documentation of encounter.       Lucas Parker, MD

## 2023-11-28 NOTE — Assessment & Plan Note (Signed)
 Blood pressure close to goal on triamterene  hydrochlorothiazide .  We will check renal parameters today losing 10% of body weight may improve blood pressure control

## 2023-11-28 NOTE — Assessment & Plan Note (Addendum)
 Mild obstructive sleep apnea per patient, previously untreated due to concerns about CPAP dependency. Discussed the bidirectional relationship between sleep apnea and weight gain, and the potential health risks of untreated sleep apnea. - Review previous sleep studies in upcoming visits to assess the severity and discuss potential treatment options. -Losing 10 to 15% of body weight may reduce AHI -Consider rechallenging with GLP-1 - Review sleep study

## 2023-11-28 NOTE — Assessment & Plan Note (Signed)
 Contributing factors: Low volume with physical activity, high body fat percentage, untreated OSA, menopause, family history, impaired mobility due to orthopedic problems  Obesity with a current weight of 257 pounds, aiming to reduce to 200 pounds. Weight issues exacerbated by osteoarthritis limiting mobility. Engaged in a weight management program focusing on sustainable weight loss of 1-2 pounds per week. Emphasis on nutrition therapy, physical activity as tolerated, and addressing emotional eating. Discussed the importance of a calorie deficit and the role of protein in appetite suppression and muscle maintenance. - Implement a 1300 calorie per day diet to create a calorie deficit. - Follow a structured meal plan with breakfast, lunch, and dinner, including 300 calories for snacks if needed. - Emphasize reducing processed foods and increasing intake of whole foods, fruits, and vegetables. - Incorporate protein in each meal to aid in appetite suppression and muscle maintenance. - Encourage physical activity as tolerated, including water aerobics starting August 4. - Address emotional eating with potential referral to a specialist if needed.

## 2023-11-28 NOTE — Assessment & Plan Note (Signed)
 Most recent A1c is  Lab Results  Component Value Date   HGBA1C 6.3 06/13/2023   HGBA1C 6.0 (H) 01/30/2014    Patient aware of disease state and risk of progression. This may contribute to abnormal cravings, fatigue and diabetic complications without having diabetes.   We have discussed treatment options which include: losing 7 to 10% of body weight, increasing physical activity to a goal of 150 minutes a week at moderate intensity.  Advised to maintain a diet low on simple and processed carbohydrates.  She may also be a candidate for pharmacoprophylaxis with metformin  or incretin mimetic.  Had been on Ozempic in the past but discontinued medication due to adverse effects.  Unlikely due to not having nutritional plan.

## 2023-11-29 LAB — CBC WITH DIFFERENTIAL/PLATELET
Basophils Absolute: 0 x10E3/uL (ref 0.0–0.2)
Basos: 1 %
EOS (ABSOLUTE): 0.1 x10E3/uL (ref 0.0–0.4)
Eos: 3 %
Hematocrit: 43.2 % (ref 34.0–46.6)
Hemoglobin: 13.7 g/dL (ref 11.1–15.9)
Immature Grans (Abs): 0 x10E3/uL (ref 0.0–0.1)
Immature Granulocytes: 0 %
Lymphocytes Absolute: 1.9 x10E3/uL (ref 0.7–3.1)
Lymphs: 44 %
MCH: 27.2 pg (ref 26.6–33.0)
MCHC: 31.7 g/dL (ref 31.5–35.7)
MCV: 86 fL (ref 79–97)
Monocytes Absolute: 0.3 x10E3/uL (ref 0.1–0.9)
Monocytes: 7 %
Neutrophils Absolute: 1.9 x10E3/uL (ref 1.4–7.0)
Neutrophils: 45 %
Platelets: 218 x10E3/uL (ref 150–450)
RBC: 5.03 x10E6/uL (ref 3.77–5.28)
RDW: 12.5 % (ref 11.7–15.4)
WBC: 4.3 x10E3/uL (ref 3.4–10.8)

## 2023-11-29 LAB — COMPREHENSIVE METABOLIC PANEL WITH GFR
ALT: 19 IU/L (ref 0–32)
AST: 23 IU/L (ref 0–40)
Albumin: 3.5 g/dL — ABNORMAL LOW (ref 3.9–4.9)
Alkaline Phosphatase: 57 IU/L (ref 44–121)
BUN/Creatinine Ratio: 13 (ref 12–28)
BUN: 9 mg/dL (ref 8–27)
Bilirubin Total: 0.3 mg/dL (ref 0.0–1.2)
CO2: 22 mmol/L (ref 20–29)
Calcium: 8.8 mg/dL (ref 8.7–10.3)
Chloride: 103 mmol/L (ref 96–106)
Creatinine, Ser: 0.71 mg/dL (ref 0.57–1.00)
Globulin, Total: 2 g/dL (ref 1.5–4.5)
Glucose: 103 mg/dL — ABNORMAL HIGH (ref 70–99)
Potassium: 4.1 mmol/L (ref 3.5–5.2)
Sodium: 138 mmol/L (ref 134–144)
Total Protein: 5.5 g/dL — ABNORMAL LOW (ref 6.0–8.5)
eGFR: 97 mL/min/1.73 (ref >59)

## 2023-11-29 LAB — LIPID PANEL WITH LDL/HDL RATIO
Cholesterol, Total: 160 mg/dL (ref 100–199)
HDL: 31 mg/dL — ABNORMAL LOW (ref >39)
LDL Chol Calc (NIH): 103 mg/dL — ABNORMAL HIGH (ref 0–99)
LDL/HDL Ratio: 3.3 ratio — ABNORMAL HIGH (ref 0.0–3.2)
Triglycerides: 146 mg/dL (ref 0–149)
VLDL Cholesterol Cal: 26 mg/dL (ref 5–40)

## 2023-11-29 LAB — VITAMIN B12: Vitamin B-12: 606 pg/mL (ref 232–1245)

## 2023-11-29 LAB — HEMOGLOBIN A1C
Est. average glucose Bld gHb Est-mCnc: 131 mg/dL
Hgb A1c MFr Bld: 6.2 % — ABNORMAL HIGH (ref 4.8–5.6)

## 2023-11-29 LAB — TSH: TSH: 3.79 u[IU]/mL (ref 0.450–4.500)

## 2023-11-29 LAB — INSULIN, RANDOM: INSULIN: 21.2 u[IU]/mL (ref 2.6–24.9)

## 2023-11-29 LAB — VITAMIN D 25 HYDROXY (VIT D DEFICIENCY, FRACTURES): Vit D, 25-Hydroxy: 42.4 ng/mL (ref 30.0–100.0)

## 2023-12-12 ENCOUNTER — Ambulatory Visit (INDEPENDENT_AMBULATORY_CARE_PROVIDER_SITE_OTHER): Admitting: Internal Medicine

## 2023-12-12 ENCOUNTER — Encounter (INDEPENDENT_AMBULATORY_CARE_PROVIDER_SITE_OTHER): Payer: Self-pay | Admitting: Internal Medicine

## 2023-12-12 VITALS — BP 119/80 | HR 81 | Temp 98.5°F | Ht 67.0 in | Wt 245.0 lb

## 2023-12-12 DIAGNOSIS — E66812 Obesity, class 2: Secondary | ICD-10-CM

## 2023-12-12 DIAGNOSIS — E88819 Insulin resistance, unspecified: Secondary | ICD-10-CM | POA: Diagnosis not present

## 2023-12-12 DIAGNOSIS — R7303 Prediabetes: Secondary | ICD-10-CM

## 2023-12-12 DIAGNOSIS — Z6838 Body mass index (BMI) 38.0-38.9, adult: Secondary | ICD-10-CM

## 2023-12-12 DIAGNOSIS — I10 Essential (primary) hypertension: Secondary | ICD-10-CM | POA: Diagnosis not present

## 2023-12-12 DIAGNOSIS — G4733 Obstructive sleep apnea (adult) (pediatric): Secondary | ICD-10-CM

## 2023-12-12 NOTE — Assessment & Plan Note (Signed)
 Her HOMA-IR is 5.39 which is elevated. Optimal level < 1.9.   This is complex condition associated with genetics, ectopic fat and lifestyle factors. Insulin  resistance may result in increased fat storage, inhibition of the breakdown of fat, cause fluctuations in blood sugar leading to energy crashes and increased cravings for sugary or high carb foods and cause metabolic slowdown making it difficult to lose weight.  This may result in additional weight gain and lead to pre-diabetes and diabetes if untreated. In addition, hyperinsulinemia increases cardiovascular risk, chronic inflammatory response and may increase the risk of obesity related malignancies.  Lab Results  Component Value Date   HGBA1C 6.2 (H) 11/28/2023   Lab Results  Component Value Date   INSULIN  21.2 11/28/2023   Lab Results  Component Value Date   GLUCOSE 103 (H) 11/28/2023   GLUCOSE 121 (H) 04/26/2006    We reviewed treatment options which include losing 7 to 10% of body weight, increasing volume of physical activity and maintaining a diet low in saturated fats and with a low glycemic load.  Patient has also been educated on the carb insulin  model of obesity.  She is intolerant to metformin .  She may benefit from GLP-1 aided weight loss.

## 2023-12-12 NOTE — Assessment & Plan Note (Signed)
 Complex obesity with associated prediabetes, hypertension, sleep apnea, hyperlipidemia, and insulin  resistance She has lost 4 pounds since the last visit, with a reduction in body fat percentage from 50% to 48% and an increase in muscle mass. Her A1c is 6.2, indicating proximity to diabetes. Insulin  levels are elevated at 21, suggesting insulin  resistance. Blood pressure is well-controlled without recent need for medication. Protein intake has improved with supplementation. She reports difficulty with water intake but is working on it. She is not on weight loss medications and is advised to focus on dietary and lifestyle changes before considering medication. Discussed potential use of Wegovy  or Zepbound  if insurance covers it, and previous intolerance to metformin . Insurance coverage for anti-obesity medications is uncertain. - Continue 1300 calorie diet with increased protein intake to 30 grams per meal. - Encourage regular exercise, including water aerobics three times a week. - Advise on strategies to increase water intake, such as using a large water bottle and not waiting until thirsty to drink. - Monitor blood pressure and consider reducing medication if consistently low. - Discuss with insurance about coverage for Wegovy  or Zepbound  for weight management. - Re-evaluate in three weeks to discuss potential use of weight loss medications based on insurance coverage. - Educate on differentiating between hunger and cravings and strategies to manage each. - Encourage consumption of whole foods and complex carbohydrates. - Advise on potential use of protein balls as a healthy snack option.

## 2023-12-12 NOTE — Assessment & Plan Note (Signed)
 Blood pressure close to goal on triamterene  hydrochlorothiazide .  Reviewed most recent renal parameters which showed normal electrolytes and GFR.  If she begins to experience orthostasis she may be able to reduce medication.  Monitor for orthostasis.

## 2023-12-12 NOTE — Assessment & Plan Note (Signed)
 Mild obstructive sleep apnea per patient, previously untreated. Discussed the bidirectional relationship between sleep apnea and weight gain, and the potential health risks of untreated sleep apnea. - Review previous sleep studies in upcoming visits to assess the severity and discuss potential treatment options. -Losing 10 to 15% of body weight may reduce AHI -Consider rechallenging with GLP-1 - Review sleep study

## 2023-12-12 NOTE — Assessment & Plan Note (Signed)
 Most recent A1c is  Lab Results  Component Value Date   HGBA1C 6.2 (H) 11/28/2023   HGBA1C 6.0 (H) 01/30/2014    Patient aware of disease state and risk of progression. This may contribute to abnormal cravings, fatigue and diabetic complications without having diabetes.   We have discussed treatment options which include: losing 7 to 10% of body weight, increasing physical activity to a goal of 150 minutes a week at moderate intensity.  Advised to maintain a diet low on simple and processed carbohydrates.  She had been on metformin  in the past but had GI side effects she is interested in GLP-1 which may be beneficial for patient.  We will discuss further at the next office visit

## 2023-12-12 NOTE — Progress Notes (Signed)
 Office: [phone]  /  Fax: [phone]  Weight Summary and Body Composition Analysis (BIA)  Vitals Temp: 98.5 F (36.9 C) BP: 119/80 Pulse Rate: 81 SpO2: 96 %   Anthropometric Measurements Height: 5' 7 (1.702 m) Weight: 245 lb (111.1 kg) BMI (Calculated): 38.36 Weight at Last Visit: 249 lb Weight Lost Since Last Visit: 4 lb Weight Gained Since Last Visit: 0 lb Starting Weight: 249 lb Total Weight Loss (lbs): 4 lb (1.814 kg) Peak Weight: 257 lb   Body Composition  Body Fat %: 48.8 % Fat Mass (lbs): 119.6 lbs Muscle Mass (lbs): 119 lbs Total Body Water (lbs): 84.2 lbs Visceral Fat Rating : 15    RMR: 1843  Today's Visit #: 2  Starting Date: 11/28/23   Subjective   Chief Complaint: Obesity  Interval History Discussed the use of AI scribe software for clinical note transcription with the patient, who gave verbal consent to proceed.  History of Present Illness   Kathleen Mcfarland is a 63 year old female with complex obesity, hypertension, sleep apnea, and hyperlipidemia who presents for medical weight management.  She is on a calorie target of about 1300 calories per day and has lost four pounds since her intake appointment, indicating good adherence to her plan. She engages in water aerobics three times a week for sixty minutes, which contributes to her weight management efforts.  She experiences difficulty with water intake, expressing concern about needing to be at home to manage her hydration. Despite this, she is working on improving her water consumption as part of her regimen.  She sometimes feels hungry on the 1300 calorie diet.  Her daily diet includes a green smoothie shake with fruits, vegetables, flax seed, collagen, and protein powder. She is mindful of her protein intake, aiming for at least thirty grams per meal, and has increased her protein intake after noticing low protein levels in her blood work.  She feels hungry after her  hour-long water aerobics sessions, believing she has burned off her breakfast and needs to eat more. She is incorporating more whole foods into her diet, including fruits, vegetables, and lean proteins, and is mindful of her carbohydrate intake, choosing whole grains over simple carbs.  Her blood pressure is reportedly good, and she has not taken her blood pressure medication recently. She takes Synthroid  with water in the morning and notes that her blood pressure medication causes nausea if taken without food.  Recent blood work indicates borderline low protein levels, normal liver enzymes, low HDL, elevated LDL, and borderline triglycerides. Her A1c is 6.2, and her insulin  levels are high at 21.       Challenges affecting patient progress: strong hunger signals and/or impaired satiety / inhibitory control and menopause.    Pharmacotherapy for weight management: She is currently taking no anti-obesity medication and had been on Ozempic in the past and is requesting antiobesity medications.   Assessment and Plan   Treatment Plan For Obesity:  Recommended Dietary Goals  Kathleen Mcfarland is currently in the action stage of change. As such, her goal is to continue weight management plan. She has agreed to: continue current plan  Behavioral Health and Counseling  We discussed the following behavioral modification strategies today: increasing lean protein intake to established goals, increasing vegetables, increasing fiber rich foods, and continue to work on maintaining a reduced calorie state, getting the recommended amount of protein, incorporating whole foods, making healthy choices, staying well hydrated and practicing mindfulness when eating..  Additional education and  resources provided today: None  Recommended Physical Activity Goals  Kathleen Mcfarland has been advised to work up to 150 minutes of moderate intensity aerobic activity a week and strengthening exercises 2-3 times per week for  cardiovascular health, weight loss maintenance and preservation of muscle mass.   She has agreed to :  continue to gradually increase the amount and intensity of exercise routine  Medical Interventions and Pharmacotherapy  We discussed various medication options to help Kathleen Mcfarland with her weight loss efforts and we both agreed to : Continue with current nutritional and behavioral strategies.  Patient would like to discuss GLP-1 treatment at the next visit  Associated Conditions Impacted by Obesity Treatment  Assessment & Plan Insulin  resistance Her HOMA-IR is 5.39 which is elevated. Optimal level < 1.9.   This is complex condition associated with genetics, ectopic fat and lifestyle factors. Insulin  resistance may result in increased fat storage, inhibition of the breakdown of fat, cause fluctuations in blood sugar leading to energy crashes and increased cravings for sugary or high carb foods and cause metabolic slowdown making it difficult to lose weight.  This may result in additional weight gain and lead to pre-diabetes and diabetes if untreated. In addition, hyperinsulinemia increases cardiovascular risk, chronic inflammatory response and may increase the risk of obesity related malignancies.  Lab Results  Component Value Date   HGBA1C 6.2 (H) 11/28/2023   Lab Results  Component Value Date   INSULIN  21.2 11/28/2023   Lab Results  Component Value Date   GLUCOSE 103 (H) 11/28/2023   GLUCOSE 121 (H) 04/26/2006    We reviewed treatment options which include losing 7 to 10% of body weight, increasing volume of physical activity and maintaining a diet low in saturated fats and with a low glycemic load.  Patient has also been educated on the carb insulin  model of obesity.  She is intolerant to metformin .  She may benefit from GLP-1 aided weight loss.  HYPERTENSION, BENIGN ESSENTIAL Blood pressure close to goal on triamterene  hydrochlorothiazide .  Reviewed most recent renal parameters  which showed normal electrolytes and GFR.  If she begins to experience orthostasis she may be able to reduce medication.  Monitor for orthostasis. Pre-diabetes Most recent A1c is  Lab Results  Component Value Date   HGBA1C 6.2 (H) 11/28/2023   HGBA1C 6.0 (H) 01/30/2014    Patient aware of disease state and risk of progression. This may contribute to abnormal cravings, fatigue and diabetic complications without having diabetes.   We have discussed treatment options which include: losing 7 to 10% of body weight, increasing physical activity to a goal of 150 minutes a week at moderate intensity.  Advised to maintain a diet low on simple and processed carbohydrates.  She had been on metformin  in the past but had GI side effects she is interested in GLP-1 which may be beneficial for patient.  We will discuss further at the next office visit  Class 2 severe obesity due to excess calories with serious comorbidity and body mass index (BMI) of 38.0 to 38.9 in adult [facility]) Complex obesity with associated prediabetes, hypertension, sleep apnea, hyperlipidemia, and insulin  resistance She has lost 4 pounds since the last visit, with a reduction in body fat percentage from 50% to 48% and an increase in muscle mass. Her A1c is 6.2, indicating proximity to diabetes. Insulin  levels are elevated at 21, suggesting insulin  resistance. Blood pressure is well-controlled without recent need for medication. Protein intake has improved with supplementation. She reports difficulty with water  intake but is working on it. She is not on weight loss medications and is advised to focus on dietary and lifestyle changes before considering medication. Discussed potential use of Wegovy  or Zepbound if insurance covers it, and previous intolerance to metformin . Insurance coverage for anti-obesity medications is uncertain. - Continue 1300 calorie diet with increased protein intake to 30 grams per meal. - Encourage regular exercise,  including water aerobics three times a week. - Advise on strategies to increase water intake, such as using a large water bottle and not waiting until thirsty to drink. - Monitor blood pressure and consider reducing medication if consistently low. - Discuss with insurance about coverage for Wegovy  or Zepbound for weight management. - Re-evaluate in three weeks to discuss potential use of weight loss medications based on insurance coverage. - Educate on differentiating between hunger and cravings and strategies to manage each. - Encourage consumption of whole foods and complex carbohydrates. - Advise on potential use of protein balls as a healthy snack option. OSA (obstructive sleep apnea) Mild obstructive sleep apnea per patient, previously untreated. Discussed the bidirectional relationship between sleep apnea and weight gain, and the potential health risks of untreated sleep apnea. - Review previous sleep studies in upcoming visits to assess the severity and discuss potential treatment options. -Losing 10 to 15% of body weight may reduce AHI -Consider rechallenging with GLP-1 - Review sleep study               Objective   Physical Exam:  Blood pressure 119/80, pulse 81, temperature 98.5 F (36.9 C), height 5' 7 (1.702 m), weight 245 lb (111.1 kg), SpO2 96%. Body mass index is 38.37 kg/m.  General: She is overweight, cooperative, alert, well developed, and in no acute distress. PSYCH: Has normal mood, affect and thought process.   HEENT: EOMI, sclerae are anicteric. Lungs: Normal breathing effort, no conversational dyspnea. Extremities: No edema.  Neurologic: No gross sensory or motor deficits. No tremors or fasciculations noted.    Diagnostic Data Reviewed:  BMET    Component Value Date/Time   NA 138 11/28/2023 1119   K 4.1 11/28/2023 1119   CL 103 11/28/2023 1119   CO2 22 11/28/2023 1119   GLUCOSE 103 (H) 11/28/2023 1119   GLUCOSE 120 (H) 06/13/2023 1216   GLUCOSE  121 (H) 04/26/2006 1348   BUN 9 11/28/2023 1119   CREATININE 0.71 11/28/2023 1119   CREATININE 0.57 12/06/2020 1415   CALCIUM 8.8 11/28/2023 1119   GFRNONAA >60 07/15/2021 1903   GFRNONAA 77 11/30/2014 1144   GFRAA 111 12/22/2019 0913   GFRAA 89 11/30/2014 1144   Lab Results  Component Value Date   HGBA1C 6.2 (H) 11/28/2023   HGBA1C 6.0 (H) 01/30/2014   Lab Results  Component Value Date   INSULIN  21.2 11/28/2023   Lab Results  Component Value Date   TSH 3.790 11/28/2023   CBC    Component Value Date/Time   WBC 4.3 11/28/2023 1119   WBC 4.8 06/13/2023 1216   RBC 5.03 11/28/2023 1119   RBC 5.23 (H) 06/13/2023 1216   HGB 13.7 11/28/2023 1119   HCT 43.2 11/28/2023 1119   PLT 218 11/28/2023 1119   MCV 86 11/28/2023 1119   MCH 27.2 11/28/2023 1119   MCH 27.9 07/15/2021 1903   MCHC 31.7 11/28/2023 1119   MCHC 32.9 06/13/2023 1216   RDW 12.5 11/28/2023 1119   Iron Studies    Component Value Date/Time   IRON 90 06/13/2023 1216   FERRITIN  12.6 06/13/2023 1216   Lipid Panel     Component Value Date/Time   CHOL 160 11/28/2023 1119   TRIG 146 11/28/2023 1119   HDL 31 (L) 11/28/2023 1119   CHOLHDL 5 06/13/2023 1216   VLDL 30.8 06/13/2023 1216   LDLCALC 103 (H) 11/28/2023 1119   LDLCALC 107 (H) 12/06/2020 1415   Hepatic Function Panel     Component Value Date/Time   PROT 5.5 (L) 11/28/2023 1119   ALBUMIN 3.5 (L) 11/28/2023 1119   AST 23 11/28/2023 1119   ALT 19 11/28/2023 1119   ALKPHOS 57 11/28/2023 1119   BILITOT 0.3 11/28/2023 1119   BILIDIR 0.1 01/03/2011 0844      Component Value Date/Time   TSH 3.790 11/28/2023 1119   Nutritional Lab Results  Component Value Date   VD25OH 42.4 11/28/2023   VD25OH 32.73 09/08/2022   VD25OH 58 12/06/2020    Medications: Outpatient Encounter Medications as of 12/12/2023  Medication Sig   acetaminophen  (TYLENOL ) 500 MG tablet Take 650 mg by mouth every 6 (six) hours as needed.   ALPRAZolam  (XANAX ) 0.25 MG tablet  Take 1 tablet by mouth twice daily as needed for anxiety   Ascorbic Acid (VITAMIN C ) 500 MG CAPS Take 1 tab daily   baclofen  (LIORESAL ) 10 MG tablet Take 0.5-1 tablets (5-10 mg total) by mouth 2 (two) times daily as needed for muscle spasms.   Cholecalciferol  (VITAMIN D ) 125 MCG (5000 UT) CAPS Take 125 mcg by mouth daily. (Patient taking differently: Take 1,000 mcg by mouth daily.)   SYNTHROID  200 MCG tablet TAKE 1 TABLET BY MOUTH ONCE DAILY BEFORE BREAKFAST   triamterene -hydrochlorothiazide  (MAXZIDE -25) 37.5-25 MG tablet TAKE 1 TABLET ONE TIME DAILY FOR BLOOD PRESSURE   vitamin B-12 (CYANOCOBALAMIN ) 1000 MCG tablet Take 1,000 mcg by mouth daily.   doxycycline  (VIBRA -TABS) 100 MG tablet Take 1 tablet (100 mg total) by mouth 2 (two) times daily. (Patient not taking: Reported on 11/28/2023)   nystatin -triamcinolone  ointment (MYCOLOG) Apply 1 Application topically 2 (two) times daily. (Patient not taking: Reported on 11/28/2023)   UNABLE TO FIND JJ SMITH BLOOD SUGAR FOCUS JJ LIVER FOCUS BLUE SKY D3 WITH K2 (Patient not taking: Reported on 11/28/2023)   Zinc  100 MG TABS Take 1 tab daily (Patient not taking: Reported on 12/12/2023)   zinc  gluconate 50 MG tablet Take 50 mg by mouth daily. (Patient not taking: Reported on 12/12/2023)   [DISCONTINUED] albuterol  (VENTOLIN  HFA) 108 (90 Base) MCG/ACT inhaler Inhale 2 puffs into the lungs every 6 (six) hours as needed for wheezing or shortness of breath. (Patient not taking: Reported on 11/28/2023)   [DISCONTINUED] benzonatate  (TESSALON ) 100 MG capsule Take 1 capsule (100 mg total) by mouth 3 (three) times daily as needed for cough. (Patient not taking: Reported on 11/28/2023)   [DISCONTINUED] cetirizine  (ZYRTEC  ALLERGY) 10 MG tablet Take 1 tablet (10 mg total) by mouth daily. (Patient not taking: Reported on 11/28/2023)   No facility-administered encounter medications on file as of 12/12/2023.     Follow-Up   Return in about 3 weeks (around 01/02/2024) for For  Weight Mangement with Dr. Francyne.SABRA She was informed of the importance of frequent follow up visits to maximize her success with intensive lifestyle modifications for her multiple health conditions.  Attestation Statement   Reviewed by clinician on day of visit: allergies, medications, problem list, medical history, surgical history, family history, social history, and previous encounter notes.   I have spent 46 minutes in the care of the patient  today including: 3 minutes before the visit reviewing and preparing the chart. 35 minutes face-to-face assessing and reviewing listed medical problems as outlined in obesity care plan, providing nutritional and behavioral counseling on topics outlined in the obesity care plan, counseling regarding anti-obesity medication as outlined in obesity care plan, independently interpreting test results and goals of care, as described in assessment and plan, and reviewing and discussing biometric information and progress 8 minutes after the visit updating chart and documentation of encounter.    Lucas Parker, MD

## 2023-12-13 ENCOUNTER — Ambulatory Visit (INDEPENDENT_AMBULATORY_CARE_PROVIDER_SITE_OTHER)

## 2023-12-13 VITALS — Ht 66.0 in | Wt 245.0 lb

## 2023-12-13 DIAGNOSIS — Z0000 Encounter for general adult medical examination without abnormal findings: Secondary | ICD-10-CM | POA: Diagnosis not present

## 2023-12-13 NOTE — Patient Instructions (Addendum)
 Ms. Mabin , Thank you for taking time out of your busy schedule to complete your Annual Wellness Visit with me. I enjoyed our conversation and look forward to speaking with you again next year. I, as well as your care team,  appreciate your ongoing commitment to your health goals. Please review the following plan we discussed and let me know if I can assist you in the future. Your Game plan/ To Do List    Referrals: If you haven't heard from the office you've been referred to, please reach out to them at the phone provided.   Follow up Visits: We will see or speak with you next year for your Next Medicare AWV with our clinical staff Have you seen your provider in the last 6 months (3 months if uncontrolled diabetes)? No  Clinician Recommendations:  Aim for 30 minutes of exercise or brisk walking, 6-8 glasses of water, and 5 servings of fruits and vegetables each day. Educated and advised on getting the Shingles, Pneumonia, and Tdap vaccines in 2025.      This is a list of the screenings recommended for you:  Health Maintenance  Topic Date Due   Pneumococcal Vaccine for age over 26 (1 of 1 - PCV) Never done   Zoster (Shingles) Vaccine (1 of 2) Never done   DTaP/Tdap/Td vaccine (4 - Td or Tdap) 05/01/2016   COVID-19 Vaccine (1 - 2024-25 season) Never done   Flu Shot  11/30/2023   Medicare Annual Wellness Visit  12/12/2024   Mammogram  10/02/2025   Colon Cancer Screening  12/31/2026   Pap with HPV screening  02/23/2028   Hepatitis C Screening  Completed   HIV Screening  Completed   Hepatitis B Vaccine  Aged Out   HPV Vaccine  Aged Out   Meningitis B Vaccine  Aged Out    Advanced directives: (Declined) Advance directive discussed with you today. Even though you declined this today, please call our office should you change your mind, and we can give you the proper paperwork for you to fill out. Advance Care Planning is important because it:  [x]  Makes sure you receive the medical care  that is consistent with your values, goals, and preferences  [x]  It provides guidance to your family and loved ones and reduces their decisional burden about whether or not they are making the right decisions based on your wishes.  Follow the link provided in your after visit summary or read over the paperwork we have mailed to you to help you started getting your Advance Directives in place. If you need assistance in completing these, please reach out to us  so that we can help you!

## 2023-12-13 NOTE — Progress Notes (Addendum)
 Subjective:  Please attest and cosign this visit due to patients primary care provider not being in the office at the time the visit was completed.  (Pt of Kathleen Pereyra, NP)   Kathleen Mcfarland is a 63 y.o. who presents for a Medicare Wellness preventive visit.  As a reminder, Annual Wellness Visits don't include a physical exam, and some assessments may be limited, especially if this visit is performed virtually. We may recommend an in-person follow-up visit with your provider if needed.  Visit Complete: Virtual I connected with  Kathleen Mcfarland on 12/13/23 by a audio enabled telemedicine application and verified that I am speaking with the correct person using two identifiers.  Patient Location: Home  Provider Location: Office/Clinic  I discussed the limitations of evaluation and management by telemedicine. The patient expressed understanding and agreed to proceed.  Vital Signs: Because this visit was a virtual/telehealth visit, some criteria may be missing or patient reported. Any vitals not documented were not able to be obtained and vitals that have been documented are patient reported.  VideoDeclined- This patient declined Librarian, academic. Therefore the visit was completed with audio only.  Persons Participating in Visit: Patient.  AWV Questionnaire: No: Patient Medicare AWV questionnaire was not completed prior to this visit.  Cardiac Risk Factors include: advanced age (>36men, >38 women);dyslipidemia;hypertension     Objective:    Today's Vitals   12/13/23 1525  Weight: 245 lb (111.1 kg)  Height: 5' 6 (1.676 m)   Body mass index is 39.54 kg/m.     12/13/2023    3:25 PM 08/11/2021   12:03 PM 07/15/2021    6:21 PM 09/05/2016    1:52 PM 03/04/2015   11:17 AM 12/15/2014    6:10 PM 12/15/2014    1:46 PM  Advanced Directives  Does Patient Have a Medical Advance Directive? No No No No  No  No  No   Would patient like information on  creating a medical advance directive? No - Patient declined No - Patient declined No - Patient declined   No - patient declined information  No - patient declined information      Data saved with a previous flowsheet row definition    Current Medications (verified) Outpatient Encounter Medications as of 12/13/2023  Medication Sig   acetaminophen  (TYLENOL ) 500 MG tablet Take 650 mg by mouth every 6 (six) hours as needed.   ALPRAZolam  (XANAX ) 0.25 MG tablet Take 1 tablet by mouth twice daily as needed for anxiety   Ascorbic Acid (VITAMIN C ) 500 MG CAPS Take 1 tab daily   baclofen  (LIORESAL ) 10 MG tablet Take 0.5-1 tablets (5-10 mg total) by mouth 2 (two) times daily as needed for muscle spasms.   SYNTHROID  200 MCG tablet TAKE 1 TABLET BY MOUTH ONCE DAILY BEFORE BREAKFAST   triamterene -hydrochlorothiazide  (MAXZIDE -25) 37.5-25 MG tablet TAKE 1 TABLET ONE TIME DAILY FOR BLOOD PRESSURE   Cholecalciferol  (VITAMIN D ) 125 MCG (5000 UT) CAPS Take 125 mcg by mouth daily. (Patient not taking: Reported on 12/13/2023)   doxycycline  (VIBRA -TABS) 100 MG tablet Take 1 tablet (100 mg total) by mouth 2 (two) times daily. (Patient not taking: Reported on 11/28/2023)   nystatin -triamcinolone  ointment (MYCOLOG) Apply 1 Application topically 2 (two) times daily. (Patient not taking: Reported on 12/13/2023)   UNABLE TO FIND JJ SMITH BLOOD SUGAR FOCUS JJ LIVER FOCUS BLUE SKY D3 WITH K2 (Patient not taking: Reported on 11/28/2023)   vitamin B-12 (CYANOCOBALAMIN ) 1000 MCG tablet Take  1,000 mcg by mouth daily.   Zinc  100 MG TABS Take 1 tab daily (Patient not taking: Reported on 12/13/2023)   zinc  gluconate 50 MG tablet Take 50 mg by mouth daily. (Patient not taking: Reported on 12/13/2023)   No facility-administered encounter medications on file as of 12/13/2023.    Allergies (verified) Penicillins, Felodipine, and Metformin  and related   History: Past Medical History:  Diagnosis Date   Allergic rhinitis    Anxiety     B12 deficiency    Back pain    Back pain    Cataract    Complication of anesthesia    hard to wake up   Depression    Diabetes (HCC)    Edema of both lower extremities    Glaucoma    Hypertension    Hypothyroidism    Joint pain    Lactose intolerance    Osteoarthritis 2012   bilateral knees   Osteoarthritis of knee    Osteoporosis    Vitamin D  deficiency    Past Surgical History:  Procedure Laterality Date   COLONOSCOPY  09/05/2016   Dr.Jacobs   DILATION AND CURETTAGE OF UTERUS     ROTATOR CUFF REPAIR     SHOULDER ARTHROSCOPY  04/13/2011   Procedure: ARTHROSCOPY SHOULDER;  Surgeon: Toribio JULIANNA Chancy, MD;  Location: [facility];  Service: Orthopedics;  Laterality: Right;  Right Shoulder Arthroscopy with Acrmioploasty, Arhtroscopic rotator cuff repair and Microfracture humeral head   SHOULDER ARTHROSCOPY WITH ROTATOR CUFF REPAIR Right 07/04/2012   Procedure: SHOULDER ARTHROSCOPY WITH ROTATOR CUFF REPAIR;  Surgeon: Toribio JULIANNA Chancy, MD;  Location: [facility];  Service: Orthopedics;  Laterality: Right;  RIGHT SHOULDER ARTHROSCOPY WITH ARTHROSCOPIC ROTATOR CUFF REPAIR, LYSIS OF ADHESIONS   TUBAL LIGATION     Family History  Problem Relation Age of Onset   Diabetes Mother    Hypertension Mother    Depression Mother    Anxiety disorder Mother    Bipolar disorder Mother    Liver disease Mother    Cancer Mother    Coronary artery disease Father 83       heart disease diagnoised   Hypertension Father    Heart disease Father    Colon cancer Father        unknown age   Diabetes Father    Hyperlipidemia Father    Arthritis Sister    Hypertension Sister    Diabetes Sister    Colon polyps Sister    Cervical cancer Sister    Other Sister        uterine fibroids   Hypertension Sister    Diabetes Sister    Colon polyps Sister    Colon polyps Brother    Colon cancer Maternal Grandmother    Ovarian cancer Maternal Grandmother    Dislocations  Maternal Grandmother    Cirrhosis Maternal Grandfather    Heart disease Paternal Grandmother    Coronary artery disease Paternal Grandmother    Cancer Paternal Grandmother    Colon cancer Other        great uncle at 20, great aunt dx at 18, GM age 106   Colon polyps Other        2 sisters dx at age 86 and 48   Breast cancer Neg Hx    Stroke Neg Hx    Social History   Socioeconomic History   Marital status: Single    Spouse name: Not on file   Number of children: 2  Years of education: Not on file   Highest education level: Not on file  Occupational History   Occupation: None  Tobacco Use   Smoking status: Never   Smokeless tobacco: Never  Vaping Use   Vaping status: Never Used  Substance and Sexual Activity   Alcohol use: Yes    Comment: glass of wine once a month   Drug use: No   Sexual activity: Yes    Birth control/protection: Other-see comments    Comment: BTL  Other Topics Concern   Not on file  Social History Narrative   Regular Exercise- yes 3/week   Caffeine- no   Social Drivers of Corporate investment banker Strain: Low Risk  (12/13/2023)   Overall Financial Resource Strain (CARDIA)    Difficulty of Paying Living Expenses: Not hard at all  Food Insecurity: No Food Insecurity (12/13/2023)   Hunger Vital Sign    Worried About Running Out of Food in the Last Year: Never true    Ran Out of Food in the Last Year: Never true  Transportation Needs: No Transportation Needs (12/13/2023)   PRAPARE - Administrator, Civil Service (Medical): No    Lack of Transportation (Non-Medical): No  Physical Activity: Sufficiently Active (12/13/2023)   Exercise Vital Sign    Days of Exercise per Week: 5 days    Minutes of Exercise per Session: 30 min  Stress: No Stress Concern Present (12/13/2023)   [employer] of Occupational Health - Occupational Stress Questionnaire    Feeling of Stress: Not at all  Social Connections: Socially Isolated (12/13/2023)    Social Connection and Isolation Panel    Frequency of Communication with Friends and Family: More than three times a week    Frequency of Social Gatherings with Friends and Family: Once a week    Attends Religious Services: Never    Database administrator or Organizations: No    Attends Engineer, structural: Never    Marital Status: Never married    Tobacco Counseling Counseling given: No    Clinical Intake:  Pre-visit preparation completed: Yes  Pain : No/denies pain     BMI - recorded: 39.54 Nutritional Status: BMI > 30  Obese Nutritional Risks: None Diabetes: No  Lab Results  Component Value Date   HGBA1C 6.2 (H) 11/28/2023   HGBA1C 6.3 06/13/2023   HGBA1C 6.3 09/08/2022     How often do you need to have someone help you when you read instructions, pamphlets, or other written materials from your doctor or pharmacy?: 1 - Never  Interpreter Needed?: No  Information entered by :: Verdie Saba, CMA   Activities of Daily Living     12/13/2023    3:29 PM  In your present state of health, do you have any difficulty performing the following activities:  Hearing? 0  Vision? 0  Difficulty concentrating or making decisions? 0  Walking or climbing stairs? 0  Dressing or bathing? 0  Doing errands, shopping? 0  Preparing Food and eating ? N  Using the Toilet? N  In the past six months, have you accidently leaked urine? N  Do you have problems with loss of bowel control? N  Managing your Medications? N  Managing your Finances? N  Housekeeping or managing your Housekeeping? N    Patient Care Team: Elnor Kathleen BRAVO, NP as PCP - General (Nurse Practitioner)  I have updated your Care Teams any recent Medical Services you may have received from other  providers in the past year.     Assessment:   This is a routine wellness examination for [place].  Hearing/Vision screen No results found.   Goals Addressed               This Visit's Progress      Weight (lb) < 200 lb (90.7 kg) (pt-stated)   245 lb (111.1 kg)     Patient stated she wants to lose about 46lbs more        Depression Screen     12/13/2023    3:30 PM 11/28/2023    9:52 AM 07/26/2023   11:34 AM 02/23/2023    9:32 AM 01/19/2022   10:28 AM 12/16/2019    4:54 PM 12/16/2019    4:49 PM  PHQ 2/9 Scores  PHQ - 2 Score 1 2 1  0 0 0 0  PHQ- 9 Score 4 4  2        Fall Risk     12/13/2023    3:30 PM 07/26/2023   11:34 AM 04/26/2023    4:16 PM 02/19/2020    3:47 PM 02/19/2020    2:35 PM  Fall Risk   Falls in the past year? 0 0 0 0 0  Number falls in past yr: 0 0 0    Injury with Fall? 0 0 0    Risk for fall due to : No Fall Risks No Fall Risks No Fall Risks    Follow up Falls evaluation completed;Falls prevention discussed Falls evaluation completed Falls evaluation completed Falls evaluation completed  Falls evaluation completed      Data saved with a previous flowsheet row definition    MEDICARE RISK AT HOME:  Medicare Risk at Home Any stairs in or around the home?: Yes If so, are there any without handrails?: No Home free of loose throw rugs in walkways, pet beds, electrical cords, etc?: Yes Adequate lighting in your home to reduce risk of falls?: Yes Life alert?: No Use of a cane, walker or w/c?: No Grab bars in the bathroom?: Yes Shower chair or bench in shower?: Yes Elevated toilet seat or a handicapped toilet?: Yes  TIMED UP AND GO:  Was the test performed?  No  Cognitive Function: 6CIT completed        12/13/2023    3:33 PM 06/13/2023   11:52 AM  6CIT Screen  What Year? 0 points 0 points  What month? 0 points 0 points  What time? 0 points 0 points  Count back from 20 0 points 0 points  Months in reverse 0 points 4 points  Repeat phrase 0 points 2 points  Total Score 0 points 6 points    Immunizations Immunization History  Administered Date(s) Administered   Influenza Split 03/29/2011   Influenza Whole 02/11/2008, 01/18/2010    Influenza,inj,Quad PF,6+ Mos 01/30/2014, 03/02/2015, 02/19/2018, 01/24/2019, 02/02/2020   Influenza-Unspecified 01/30/2014, 03/02/2015, 01/18/2016, 02/08/2017, 03/01/2017, 02/01/2020   Td 05/01/2006   Td (Adult),5 Lf Tetanus Toxid, Preservative Free 05/01/2006   Tdap 05/01/2006    Screening Tests Health Maintenance  Topic Date Due   Pneumococcal Vaccine: 50+ Years (1 of 1 - PCV) Never done   Zoster Vaccines- Shingrix (1 of 2) Never done   DTaP/Tdap/Td (4 - Td or Tdap) 05/01/2016   COVID-19 Vaccine (1 - 2024-25 season) Never done   INFLUENZA VACCINE  11/30/2023   Medicare Annual Wellness (AWV)  12/12/2024   MAMMOGRAM  10/02/2025   Colonoscopy  12/31/2026   Cervical Cancer Screening (  HPV/Pap Cotest)  02/23/2028   Hepatitis C Screening  Completed   HIV Screening  Completed   Hepatitis B Vaccines 19-59 Average Risk  Aged Out   HPV VACCINES  Aged Out   Meningococcal B Vaccine  Aged Out    Health Maintenance  Health Maintenance Due  Topic Date Due   Pneumococcal Vaccine: 50+ Years (1 of 1 - PCV) Never done   Zoster Vaccines- Shingrix (1 of 2) Never done   DTaP/Tdap/Td (4 - Td or Tdap) 05/01/2016   COVID-19 Vaccine (1 - 2024-25 season) Never done   INFLUENZA VACCINE  11/30/2023   Health Maintenance Items Address: 12/13/2023  Additional Screening:  Vision Screening: Recommended annual ophthalmology exams for early detection of glaucoma and other disorders of the eye. Would you like a referral to an eye doctor? No  Patient states she's had an eye exam with an Optometrist in 2025 (unable to recall name)  Dental Screening: Recommended annual dental exams for proper oral hygiene  Community Resource Referral / Chronic Care Management: CRR required this visit?  No   CCM required this visit?  No   Plan:    I have personally reviewed and noted the following in the patient's chart:   Medical and social history Use of alcohol, tobacco or illicit drugs  Current medications and  supplements including opioid prescriptions. Patient is not currently taking opioid prescriptions. Functional ability and status Nutritional status Physical activity Advanced directives List of other physicians Hospitalizations, surgeries, and ER visits in previous 12 months Vitals Screenings to include cognitive, depression, and falls Referrals and appointments  In addition, I have reviewed and discussed with patient certain preventive protocols, quality metrics, and best practice recommendations. A written personalized care plan for preventive services as well as general preventive health recommendations were provided to patient.   Verdie CHRISTELLA Saba, CMA   12/13/2023   After Visit Summary: (MyChart) Due to this being a telephonic visit, the after visit summary with patients personalized plan was offered to patient via MyChart   Notes: Patient advised to call to schedule a 6-mth f/u w/PCP.  Medical screening examination/treatment/procedure(s) were performed by non-physician practitioner and as supervising physician I was immediately available for consultation/collaboration.  I agree with above. Karlynn Noel, MD

## 2024-01-02 ENCOUNTER — Ambulatory Visit (INDEPENDENT_AMBULATORY_CARE_PROVIDER_SITE_OTHER): Admitting: Internal Medicine

## 2024-01-02 ENCOUNTER — Encounter (INDEPENDENT_AMBULATORY_CARE_PROVIDER_SITE_OTHER): Payer: Self-pay | Admitting: Internal Medicine

## 2024-01-02 VITALS — BP 134/78 | HR 82 | Temp 98.1°F | Ht 67.0 in | Wt 243.0 lb

## 2024-01-02 DIAGNOSIS — E66812 Obesity, class 2: Secondary | ICD-10-CM | POA: Diagnosis not present

## 2024-01-02 DIAGNOSIS — R7303 Prediabetes: Secondary | ICD-10-CM

## 2024-01-02 DIAGNOSIS — G4733 Obstructive sleep apnea (adult) (pediatric): Secondary | ICD-10-CM | POA: Diagnosis not present

## 2024-01-02 DIAGNOSIS — I10 Essential (primary) hypertension: Secondary | ICD-10-CM

## 2024-01-02 DIAGNOSIS — Z6838 Body mass index (BMI) 38.0-38.9, adult: Secondary | ICD-10-CM

## 2024-01-02 DIAGNOSIS — E88819 Insulin resistance, unspecified: Secondary | ICD-10-CM | POA: Diagnosis not present

## 2024-01-02 NOTE — Assessment & Plan Note (Signed)
 Obesity, class 2, with a current weight of 243 lbs, down from a peak weight of 257 lbs. Following a 1300 calorie nutrition plan with good adherence. Engaging in water aerobics three times a week for 60 minutes. No use of weight loss medications like Ozempic due to insurance denial. Discussed the importance of adjusting carbohydrate intake, particularly high glycemic fruits, to manage weight and insulin  levels. Emphasized the importance of whole foods and portion control. Discussed the role of leptin in appetite regulation and the potential impact of insulin  on metabolism. Highlighted the importance of sustainable weight loss through dietary changes rather than reliance on medications. Discussed that 88% of people who use weight loss injections without lifestyle changes regain weight after stopping the medication. - Adjust carbohydrate intake, focusing on complex carbs and reducing high glycemic fruits. - Increase intake of whole foods, aiming for 5-6 servings per day. - Incorporate more protein into meals, especially in smoothies. - Consider a protein shake and small salad for lunch instead of skipping meals. - Provide handout on the carb insulin  model of obesity.

## 2024-01-02 NOTE — Assessment & Plan Note (Signed)
 According to records she has moderate sleep apnea on sleep study from 2023 this was found at [facility] in Care Everywhere she had an AHI of 21.  She is currently not on treatment she reports having a higher body weight at that time.  We discussed previously the bidirectional relationship between weight and untreated sleep apnea.  She may benefit from GLP-1 aided weight loss.  But she needs to continue to work on nutritional and behavioral strategies she is at the initiation phase of her weight loss.Moderate obstructive sleep apnea diagnosed in 2023 with an AHI of 21 and oxygen levels at 74. Previous sleep study conducted at home through [facility]. Discussed the importance of obtaining the sleep study report to potentially qualify for weight loss medication coverage. Emphasized the need for documentation to support insurance approval for weight loss medications. - Obtain sleep study report from [facility] to support potential insurance approval for weight loss medications.

## 2024-01-02 NOTE — Assessment & Plan Note (Signed)
 Blood pressure close to goal on triamterene  hydrochlorothiazide .  Reviewed most recent renal parameters which showed normal electrolytes and GFR.  Monitor for orthostasis while losing weight

## 2024-01-02 NOTE — Assessment & Plan Note (Signed)
 Her HOMA-IR is 5.39 which is elevated. Optimal level < 1.9.   This is complex condition associated with genetics, ectopic fat and lifestyle factors. Insulin  resistance may result in increased fat storage, inhibition of the breakdown of fat, cause fluctuations in blood sugar leading to energy crashes and increased cravings for sugary or high carb foods and cause metabolic slowdown making it difficult to lose weight.  This may result in additional weight gain and lead to pre-diabetes and diabetes if untreated. In addition, hyperinsulinemia increases cardiovascular risk, chronic inflammatory response and may increase the risk of obesity related malignancies.  Lab Results  Component Value Date   HGBA1C 6.2 (H) 11/28/2023   Lab Results  Component Value Date   INSULIN  21.2 11/28/2023   Lab Results  Component Value Date   GLUCOSE 103 (H) 11/28/2023   GLUCOSE 121 (H) 04/26/2006    We reviewed treatment options which include losing 7 to 10% of body weight, increasing volume of physical activity and maintaining a diet low in saturated fats and with a low glycemic load.  Patient has also been educated on the carb insulin  model of obesity.  She is intolerant to metformin .  She may benefit from GLP-1 aided weight loss.

## 2024-01-02 NOTE — Progress Notes (Signed)
 Office: [phone]  /  Fax: [phone]  Weight Summary and Body Composition Analysis (BIA)  Vitals Temp: 98.1 F (36.7 C) BP: 134/78 Pulse Rate: 82 SpO2: 99 %   Anthropometric Measurements Height: 5' 7 (1.702 m) Weight: 243 lb (110.2 kg) BMI (Calculated): 38.05 Weight at Last Visit: 245 lb Weight Lost Since Last Visit: 2 lb Weight Gained Since Last Visit: 0 lb Starting Weight: 249 lb Total Weight Loss (lbs): 6 lb (2.722 kg) Peak Weight: 257 lb   Body Composition  Body Fat %: 48.9 % Fat Mass (lbs): 119.2 lbs Muscle Mass (lbs): 118.2 lbs Total Body Water (lbs): 83.2 lbs Visceral Fat Rating : 15    RMR: [id number]  Today's Visit #: 3  Starting Date: 11/28/23   Subjective   Chief Complaint: Obesity  Interval History Discussed the use of AI scribe software for clinical note transcription with the patient, who gave verbal consent to proceed.  History of Present Illness Kathleen Mcfarland is a 63 year old female with prediabetes, hypertension, and sleep apnea who presents for medical weight management.  She has lost two pounds since her last visit and is adhering to a 1300 calorie nutrition plan, although she occasionally skips meals. She exercises three days a week for 60 minutes, participating in water aerobics. Her current weight is 243 pounds. She reports her highest weight was approximately 257-258 pounds.  She has a history of prediabetes, hypertension, and sleep apnea. A home sleep study conducted in 2023 showed moderate sleep apnea with an AHI of 21 and oxygen levels at 74%. At that time, her weight was approximately 257-258 pounds. She has not been using a CPAP machine, believing her weight control negated the need for it.  She has not used any weight loss medications such as Ozempic or [place] due to insurance denials. She recalls previously using phentermine, which helped with appetite control and energy levels.  Her typical diet includes a breakfast  of half a serving of oatmeal, blueberries, two egg whites, and a slice of 45-calorie Wonder Bread. She sometimes skips lunch but may combine it with dinner, which recently included nacho cheese Doritos with [place] meat, spinach, black beans, and a green smoothie made with spinach, apples, pineapples, banana, mango, flaxseed, protein powder, and water.  She has a decreased appetite and is working on increasing her water intake. She notes a significant reduction in clothing size and facial fullness, indicating a loss of inches. She is not experiencing hunger frequently and reports eating more whole foods.     Challenges affecting patient progress: none.    Pharmacotherapy for weight management: She is currently taking no anti-obesity medication and states interest in starting a medication to aid with weight loss citing difficulty with maintaining a reduced calorie state and weight loss.   Assessment and Plan   Treatment Plan For Obesity:  Recommended Dietary Goals  Kathleen Mcfarland is currently in the action stage of change. As such, her goal is to continue weight management plan. She has agreed to: follow the Category 2 plan - 1200 kcal per day  Behavioral Health and Counseling  We discussed the following behavioral modification strategies today: increasing lean protein intake to established goals, decreasing simple carbohydrates , increasing vegetables, increasing fiber rich foods, avoiding skipping meals, increasing water intake , and work on meal planning and preparation.  Additional education and resources provided today: Handout on the carbohydrate insulin  model of obesity  Recommended Physical Activity Goals  Kathleen Mcfarland has been advised to work up  to 150 minutes of moderate intensity aerobic activity a week and strengthening exercises 2-3 times per week for cardiovascular health, weight loss maintenance and preservation of muscle mass.  She has agreed to :  Increase physical activity in their  day and reduce sedentary time (increase NEAT)., Increase volume of physical activity to a goal of 240 minutes a week, and Combine aerobic and strengthening exercises for efficiency and improved cardiometabolic health.  Medical Interventions and Pharmacotherapy  We discussed various medication options to help Kathleen Mcfarland with her weight loss efforts and we both agreed to : Continue with current nutritional and behavioral strategies and patient would like to explore antiobesity medications at the next office visit.  We reviewed some of the misconceptions portrayed by social media.  We again emphasized the importance of nutritional and behavioral strategies and advised against quick fixes or treatments that promise rapid weight loss.  Associated Conditions Impacted by Obesity Treatment  Assessment & Plan HYPERTENSION, BENIGN ESSENTIAL Blood pressure close to goal on triamterene  hydrochlorothiazide .  Reviewed most recent renal parameters which showed normal electrolytes and GFR.  Monitor for orthostasis while losing weight Insulin  resistance Her HOMA-IR is 5.39 which is elevated. Optimal level < 1.9.   This is complex condition associated with genetics, ectopic fat and lifestyle factors. Insulin  resistance may result in increased fat storage, inhibition of the breakdown of fat, cause fluctuations in blood sugar leading to energy crashes and increased cravings for sugary or high carb foods and cause metabolic slowdown making it difficult to lose weight.  This may result in additional weight gain and lead to pre-diabetes and diabetes if untreated. In addition, hyperinsulinemia increases cardiovascular risk, chronic inflammatory response and may increase the risk of obesity related malignancies.  Lab Results  Component Value Date   HGBA1C 6.2 (H) 11/28/2023   Lab Results  Component Value Date   INSULIN  21.2 11/28/2023   Lab Results  Component Value Date   GLUCOSE 103 (H) 11/28/2023   GLUCOSE 121  (H) 04/26/2006    We reviewed treatment options which include losing 7 to 10% of body weight, increasing volume of physical activity and maintaining a diet low in saturated fats and with a low glycemic load.  Patient has also been educated on the carb insulin  model of obesity.  She is intolerant to metformin .  She may benefit from GLP-1 aided weight loss.  OSA (obstructive sleep apnea) -untreated-unknown severity According to records she has moderate sleep apnea on sleep study from 2023 this was found at [facility] group in [facility] Everywhere she had an AHI of 21.  She is currently not on treatment she reports having a higher body weight at that time.  We discussed previously the bidirectional relationship between weight and untreated sleep apnea.  She may benefit from GLP-1 aided weight loss.  But she needs to continue to work on nutritional and behavioral strategies she is at the initiation phase of her weight loss.Moderate obstructive sleep apnea diagnosed in 2023 with an AHI of 21 and oxygen levels at 74. Previous sleep study conducted at home through [facility]. Discussed the importance of obtaining the sleep study report to potentially qualify for weight loss medication coverage. Emphasized the need for documentation to support insurance approval for weight loss medications. - Obtain sleep study report from [facility] to support potential insurance approval for weight loss medications. Pre-diabetes Most recent A1c is  Lab Results  Component Value Date   HGBA1C 6.2 (H) 11/28/2023   HGBA1C 6.0 (H) 01/30/2014  Prediabetes with insulin  resistance, likely contributing to weight management challenges. Discussed the impact of high insulin  levels on weight gain and metabolism. Emphasized the importance of reducing carbohydrate intake to manage insulin  levels and support weight loss. Highlighted the role of insulin  in slowing metabolism and the need to focus on complex carbohydrates  and whole foods. - Reduce carbohydrate intake, focusing on complex carbohydrates and whole foods. - Monitor carbohydrate intake to manage insulin  levels.  She had been on metformin  in the past but had GI side effects she is interested in GLP-1 which may be beneficial for patient.  We will discuss further at the next office visit  Class 2 severe obesity due to excess calories with serious comorbidity and body mass index (BMI) of 38.0 to 38.9 in adult [facility]) Obesity, class 2, with a current weight of 243 lbs, down from a peak weight of 257 lbs. Following a 1300 calorie nutrition plan with good adherence. Engaging in water aerobics three times a week for 60 minutes. No use of weight loss medications like Ozempic due to insurance denial. Discussed the importance of adjusting carbohydrate intake, particularly high glycemic fruits, to manage weight and insulin  levels. Emphasized the importance of whole foods and portion control. Discussed the role of leptin in appetite regulation and the potential impact of insulin  on metabolism. Highlighted the importance of sustainable weight loss through dietary changes rather than reliance on medications. Discussed that 88% of people who use weight loss injections without lifestyle changes regain weight after stopping the medication. - Adjust carbohydrate intake, focusing on complex carbs and reducing high glycemic fruits. - Increase intake of whole foods, aiming for 5-6 servings per day. - Incorporate more protein into meals, especially in smoothies. - Consider a protein shake and small salad for lunch instead of skipping meals. - Provide handout on the carb insulin  model of obesity.    Assessment and Plan      Objective   Physical Exam:  Blood pressure 134/78, pulse 82, temperature 98.1 F (36.7 C), height 5' 7 (1.702 m), weight 243 lb (110.2 kg), SpO2 99%. Body mass index is 38.06 kg/m.  General: She is overweight, cooperative, alert, well developed,  and in no acute distress. PSYCH: Has normal mood, affect and thought process.   HEENT: EOMI, sclerae are anicteric. Lungs: Normal breathing effort, no conversational dyspnea. Extremities: No edema.  Neurologic: No gross sensory or motor deficits. No tremors or fasciculations noted.    Diagnostic Data Reviewed:  BMET    Component Value Date/Time   NA 138 11/28/2023 1119   K 4.1 11/28/2023 1119   CL 103 11/28/2023 1119   CO2 22 11/28/2023 1119   GLUCOSE 103 (H) 11/28/2023 1119   GLUCOSE 120 (H) 06/13/2023 1216   GLUCOSE 121 (H) 04/26/2006 1348   BUN 9 11/28/2023 1119   CREATININE 0.71 11/28/2023 1119   CREATININE 0.57 12/06/2020 1415   CALCIUM 8.8 11/28/2023 1119   GFRNONAA >60 07/15/2021 1903   GFRNONAA 77 11/30/2014 1144   GFRAA 111 12/22/2019 0913   GFRAA 89 11/30/2014 1144   Lab Results  Component Value Date   HGBA1C 6.2 (H) 11/28/2023   HGBA1C 6.0 (H) 01/30/2014   Lab Results  Component Value Date   INSULIN  21.2 11/28/2023   Lab Results  Component Value Date   TSH 3.790 11/28/2023   CBC    Component Value Date/Time   WBC 4.3 11/28/2023 1119   WBC 4.8 06/13/2023 1216   RBC 5.03 11/28/2023 1119   RBC  5.23 (H) 06/13/2023 1216   HGB 13.7 11/28/2023 1119   HCT 43.2 11/28/2023 1119   PLT 218 11/28/2023 1119   MCV 86 11/28/2023 1119   MCH 27.2 11/28/2023 1119   MCH 27.9 07/15/2021 1903   MCHC 31.7 11/28/2023 1119   MCHC 32.9 06/13/2023 1216   RDW 12.5 11/28/2023 1119   Iron Studies    Component Value Date/Time   IRON 90 06/13/2023 1216   FERRITIN 12.6 06/13/2023 1216   Lipid Panel     Component Value Date/Time   CHOL 160 11/28/2023 1119   TRIG 146 11/28/2023 1119   HDL 31 (L) 11/28/2023 1119   CHOLHDL 5 06/13/2023 1216   VLDL 30.8 06/13/2023 1216   LDLCALC 103 (H) 11/28/2023 1119   LDLCALC 107 (H) 12/06/2020 1415   Hepatic Function Panel     Component Value Date/Time   PROT 5.5 (L) 11/28/2023 1119   ALBUMIN 3.5 (L) 11/28/2023 1119   AST 23  11/28/2023 1119   ALT 19 11/28/2023 1119   ALKPHOS 57 11/28/2023 1119   BILITOT 0.3 11/28/2023 1119   BILIDIR 0.1 01/03/2011 0844      Component Value Date/Time   TSH 3.790 11/28/2023 1119   Nutritional Lab Results  Component Value Date   VD25OH 42.4 11/28/2023   VD25OH 32.73 09/08/2022   VD25OH 58 12/06/2020    Medications: Outpatient Encounter Medications as of 01/02/2024  Medication Sig   acetaminophen  (TYLENOL ) 500 MG tablet Take 650 mg by mouth every 6 (six) hours as needed.   ALPRAZolam  (XANAX ) 0.25 MG tablet Take 1 tablet by mouth twice daily as needed for anxiety   Ascorbic Acid (VITAMIN C ) 500 MG CAPS Take 1 tab daily   baclofen  (LIORESAL ) 10 MG tablet Take 0.5-1 tablets (5-10 mg total) by mouth 2 (two) times daily as needed for muscle spasms.   SYNTHROID  200 MCG tablet TAKE 1 TABLET BY MOUTH ONCE DAILY BEFORE BREAKFAST   triamterene -hydrochlorothiazide  (MAXZIDE -25) 37.5-25 MG tablet TAKE 1 TABLET ONE TIME DAILY FOR BLOOD PRESSURE   vitamin B-12 (CYANOCOBALAMIN ) 1000 MCG tablet Take 1,000 mcg by mouth daily.   Zinc  100 MG TABS Take 1 tab daily   Cholecalciferol  (VITAMIN D ) 125 MCG (5000 UT) CAPS Take 125 mcg by mouth daily. (Patient not taking: Reported on 01/02/2024)   doxycycline  (VIBRA -TABS) 100 MG tablet Take 1 tablet (100 mg total) by mouth 2 (two) times daily. (Patient not taking: Reported on 11/28/2023)   nystatin -triamcinolone  ointment (MYCOLOG) Apply 1 Application topically 2 (two) times daily. (Patient not taking: Reported on 12/13/2023)   UNABLE TO FIND JJ SMITH BLOOD SUGAR FOCUS JJ LIVER FOCUS BLUE SKY D3 WITH K2 (Patient not taking: Reported on 11/28/2023)   zinc  gluconate 50 MG tablet Take 50 mg by mouth daily. (Patient not taking: Reported on 12/13/2023)   No facility-administered encounter medications on file as of 01/02/2024.     Follow-Up   Return in about 3 weeks (around 01/23/2024) for For Weight Mangement with Dr. Francyne.SABRA She was informed of the  importance of frequent follow up visits to maximize her success with intensive lifestyle modifications for her multiple health conditions.  Attestation Statement   Reviewed by clinician on day of visit: allergies, medications, problem list, medical history, surgical history, family history, social history, and previous encounter notes.   I have spent 44 minutes in the care of the patient today including: 2 minutes before the visit reviewing and preparing the chart. 36 minutes face-to-face assessing and reviewing listed medical problems  as outlined in obesity care plan, providing nutritional and behavioral counseling on topics outlined in the obesity care plan, counseling regarding anti-obesity medication as outlined in obesity care plan, independently interpreting test results and goals of care, as described in assessment and plan, reviewing and discussing biometric information and progress, and reviewing latest PCP notes and specialist consultations 6 minutes after the visit updating chart and documentation of encounter.    Lucas Parker, MD

## 2024-01-02 NOTE — Assessment & Plan Note (Signed)
 Most recent A1c is  Lab Results  Component Value Date   HGBA1C 6.2 (H) 11/28/2023   HGBA1C 6.0 (H) 01/30/2014    Prediabetes with insulin  resistance, likely contributing to weight management challenges. Discussed the impact of high insulin  levels on weight gain and metabolism. Emphasized the importance of reducing carbohydrate intake to manage insulin  levels and support weight loss. Highlighted the role of insulin  in slowing metabolism and the need to focus on complex carbohydrates and whole foods. - Reduce carbohydrate intake, focusing on complex carbohydrates and whole foods. - Monitor carbohydrate intake to manage insulin  levels.  She had been on metformin  in the past but had GI side effects she is interested in GLP-1 which may be beneficial for patient.  We will discuss further at the next office visit

## 2024-01-24 ENCOUNTER — Encounter (INDEPENDENT_AMBULATORY_CARE_PROVIDER_SITE_OTHER): Payer: Self-pay | Admitting: Internal Medicine

## 2024-01-24 ENCOUNTER — Ambulatory Visit (INDEPENDENT_AMBULATORY_CARE_PROVIDER_SITE_OTHER): Admitting: Internal Medicine

## 2024-01-24 VITALS — BP 138/84 | HR 84 | Temp 98.3°F | Ht 67.0 in | Wt 241.0 lb

## 2024-01-24 DIAGNOSIS — R7303 Prediabetes: Secondary | ICD-10-CM

## 2024-01-24 DIAGNOSIS — I10 Essential (primary) hypertension: Secondary | ICD-10-CM | POA: Diagnosis not present

## 2024-01-24 DIAGNOSIS — E66812 Obesity, class 2: Secondary | ICD-10-CM

## 2024-01-24 DIAGNOSIS — G4733 Obstructive sleep apnea (adult) (pediatric): Secondary | ICD-10-CM | POA: Diagnosis not present

## 2024-01-24 DIAGNOSIS — Z6837 Body mass index (BMI) 37.0-37.9, adult: Secondary | ICD-10-CM | POA: Diagnosis not present

## 2024-01-24 MED ORDER — ZEPBOUND 2.5 MG/0.5ML ~~LOC~~ SOAJ: 2.5000 mg | SUBCUTANEOUS | 0 refills | Status: DC

## 2024-01-24 NOTE — Progress Notes (Unsigned)
 Office: [phone]  /  Fax: [phone]  Weight Summary and Body Composition Analysis (BIA)  Vitals Temp: 98.3 F (36.8 C) BP: 138/84 Pulse Rate: 84 SpO2: 99 %   Anthropometric Measurements Height: 5' 7 (1.702 m) Weight: 241 lb (109.3 kg) BMI (Calculated): 37.74 Weight at Last Visit: 243 lb Weight Lost Since Last Visit: 2 lb Weight Gained Since Last Visit: 0 lb Starting Weight: 249lb Total Weight Loss (lbs): 8 lb (3.629 kg) Peak Weight: 257 lb   Body Composition  Body Fat %: 48.2 % Fat Mass (lbs): 116.2 lbs Muscle Mass (lbs): 118.4 lbs Total Body Water (lbs): 82 lbs Visceral Fat Rating : 15    RMR: [id number]  Today's Visit #: 4  Starting Date: 11/28/23   Subjective   Chief Complaint: Obesity  Interval History Discussed the use of AI scribe software for clinical note transcription with the patient, who gave verbal consent to proceed.  History of Present Illness Kathleen Mcfarland is a 63 year old female with hypertension, insulin  resistance, sleep apnea, and prediabetes who presents for medical weight management.  She is following a 1300-calorie nutrition plan with good adherence.  She is not tracking reports eating more whole foods getting the recommended amount of protein occasionally skips meals she is exercising 3 days a week 60 minutes doing water aerobics.  She has achieved an 18 pound weight loss.   A sleep study conducted in August 2023 indicated moderate sleep apnea with an apnea-hypopnea index of 20 and oxygen saturation dropping to 74%. She has not initiated CPAP therapy.  She reports feeling great with increased strength and notes a decrease in body fat percentage from 50% to 48%. She experiences bloating, likely due to increased fiber intake from chia seeds and protein powder.     Challenges affecting patient progress: strong hunger signals and/or impaired satiety / inhibitory control.    Pharmacotherapy for weight management: She is  currently taking no anti-obesity medication and states interest in starting a medication to aid with weight loss citing difficulty with maintaining a reduced calorie state and weight loss.   Assessment and Plan   Treatment Plan For Obesity:  Recommended Dietary Goals  Nyiesha is currently in the action stage of change. As such, her goal is to continue weight management plan. She has agreed to: continue current plan  Behavioral Health and Counseling  We discussed the following behavioral modification strategies today: continue to work on maintaining a reduced calorie state, getting the recommended amount of protein, incorporating whole foods, making healthy choices, staying well hydrated and practicing mindfulness when eating. and increase protein intake, fibrous foods (25 grams per day for women, 30 grams for men) and water to improve satiety and decrease hunger signals. .  Additional education and resources provided today: None  Recommended Physical Activity Goals  Chelcea has been advised to work up to 150 minutes of moderate intensity aerobic activity a week and strengthening exercises 2-3 times per week for cardiovascular health, weight loss maintenance and preservation of muscle mass.  She has agreed to :  Think about enjoyable ways to increase daily physical activity and overcoming barriers to exercise, Increase physical activity in their day and reduce sedentary time (increase NEAT)., Increase volume of physical activity to a goal of 240 minutes a week, and Combine aerobic and strengthening exercises for efficiency and improved cardiometabolic health.  Medical Interventions and Pharmacotherapy  We discussed various medication options to help [facility] with her weight loss efforts and we both agreed  to : start anti-obesity medication.  In addition to reduced calorie nutrition plan (RCNP), behavioral strategies and physical activity, Gianny would benefit from pharmacotherapy to assist  with hunger signals, satiety and cravings. This will reduce obesity-related health risks by inducing weight loss, and help reduce food consumption and adherence to [facility]) . It may also improve QOL by improving self-confidence and reduce the setbacks associated with metabolic adaptations.  She also has high risk comorbidities associated with his obesity including: Moderate sleep apnea with an AHI of 20 and severe oxygen desaturations to 74% ; symptomatic and untreated.  She also has hypertension, hyperlipidemia and prediabetes with insulin  resistance. All of these conditions increase her risk for future complications and are either improved or potentially reverse through sustained weight loss.  GLP-1 receptor agonists have been shown in robust clinical trials to: Enhance satiety and delay gastric emptying, resulting in reduced caloric intake Improve insulin  sensitivity and glycemic control Promote clinically meaningful weight loss (>=5-22%) Reduce cardiovascular risk markers, including blood pressure, lipids, and inflammation Improved sleep apnea and associated complications by helping patient potentially lose more than 15% of total body weight.  This is usually what is required to reduce AHI.  Given Trachelle's clinical profile--GLP-1 therapy is both indicated and expected to provide multifactorial benefit. The medication's mechanism aligns precisely with the patient's needs and addresses the physiologic underpinnings of weight gain.   After a detailed discussion covering treatment rationale, mechanism of action, expected outcomes, risks, and long-term use considerations, shared decision-making was used to initiate Zepbound  2.5 mg once a week. The importance of ongoing lifestyle management and long-term adherence was emphasized, as was the potential for weight regain following discontinuation.  The delivery device was demonstrated, and using the teach-back method, Annaleah successfully demonstrated proper  injection technique. Ongoing monitoring and follow-up are planned to assess tolerability, clinical response, and reinforce behavioral strategies.    Associated Conditions Impacted by Obesity Treatment  Assessment & Plan OSA (obstructive sleep apnea) I reviewed sleep study from February 2023 done at [facility] physicians sleep medicine that showed moderate sleep apnea with an AHI of 20.8 with severe desaturations of 74%.  She did not tolerate CPAP therapy and therefore remains symptomatic and condition remains untreated.  She has been educated on conditions associated with sleep apnea in the bidirectional relationship with weight.  She would benefit from weight loss aided by GLP-1 in this case Zepbound  2.5 mg once a week. Pre-diabetes Most recent A1c is  Lab Results  Component Value Date   HGBA1C 6.2 (H) 11/28/2023   HGBA1C 6.0 (H) 01/30/2014    Prediabetes with insulin  resistance, likely contributing to weight management challenges. Discussed the impact of high insulin  levels on weight gain and metabolism. Emphasized the importance of reducing carbohydrate intake to manage insulin  levels and support weight loss. Highlighted the role of insulin  in slowing metabolism and the need to focus on complex carbohydrates and whole foods. - Reduce carbohydrate intake, focusing on complex carbohydrates and whole foods. - Monitor carbohydrate intake to manage insulin  levels.  She had been on metformin  in the past but had GI side effects she is interested in GLP-1 which may be beneficial for patient.  Considering her clinical profile I recommend starting Zepbound  2.5 mg once a week for pharmacoprophylaxis.  Essential hypertension, benign Blood pressure close to goal on triamterene  hydrochlorothiazide .  Reviewed most recent renal parameters which showed normal electrolytes and GFR.  Losing 10% of body weight may improve blood pressure control.  Starting treatment with Zepbound  will  help lower blood  pressure. Class 2 severe obesity with serious comorbidity and body mass index (BMI) of 37.0 to 37.9 in adult, unspecified obesity type Obesity with associated moderate obstructive sleep apnea, hypertension, prediabetes, and insulin  resistance Obesity is contributing to moderate obstructive sleep apnea, hypertension, prediabetes, and insulin  resistance. She has lost 18 pounds and adheres to a 1300 calorie diet 75-80% of the time. She participates in water aerobics three times a week for 60 minutes. Discussed Zepbound , an FDA-approved weight loss medication for patients with sleep apnea, which may also benefit blood pressure, insulin  resistance, cholesterol, prediabetes, inflammation, and reduce cardiovascular risks. Emphasized long-term use for sustained benefits and weight loss. A 15% weight loss is needed to improve sleep apnea. - Start Zepbound  with prior authorization process - Educate on injection technique for Zepbound  - Discuss potential side effects of Zepbound  including nausea, diarrhea, constipation, and incompatibility with fried, sugary, spicy foods, and alcohol - Advise to stay hydrated and maintain fiber intake to prevent constipation          Objective   Physical Exam:  Blood pressure 138/84, pulse 84, temperature 98.3 F (36.8 C), height 5' 7 (1.702 m), weight 241 lb (109.3 kg), SpO2 99%. Body mass index is 37.75 kg/m.  General: She is overweight, cooperative, alert, well developed, and in no acute distress. PSYCH: Has normal mood, affect and thought process.   HEENT: EOMI, sclerae are anicteric. Lungs: Normal breathing effort, no conversational dyspnea. Extremities: No edema.  Neurologic: No gross sensory or motor deficits. No tremors or fasciculations noted.    Diagnostic Data Reviewed:  BMET    Component Value Date/Time   NA 138 11/28/2023 1119   K 4.1 11/28/2023 1119   CL 103 11/28/2023 1119   CO2 22 11/28/2023 1119   GLUCOSE 103 (H) 11/28/2023 1119    GLUCOSE 120 (H) 06/13/2023 1216   GLUCOSE 121 (H) 04/26/2006 1348   BUN 9 11/28/2023 1119   CREATININE 0.71 11/28/2023 1119   CREATININE 0.57 12/06/2020 1415   CALCIUM 8.8 11/28/2023 1119   GFRNONAA >60 07/15/2021 1903   GFRNONAA 77 11/30/2014 1144   GFRAA 111 12/22/2019 0913   GFRAA 89 11/30/2014 1144   Lab Results  Component Value Date   HGBA1C 6.2 (H) 11/28/2023   HGBA1C 6.0 (H) 01/30/2014   Lab Results  Component Value Date   INSULIN  21.2 11/28/2023   Lab Results  Component Value Date   TSH 3.790 11/28/2023   CBC    Component Value Date/Time   WBC 4.3 11/28/2023 1119   WBC 4.8 06/13/2023 1216   RBC 5.03 11/28/2023 1119   RBC 5.23 (H) 06/13/2023 1216   HGB 13.7 11/28/2023 1119   HCT 43.2 11/28/2023 1119   PLT 218 11/28/2023 1119   MCV 86 11/28/2023 1119   MCH 27.2 11/28/2023 1119   MCH 27.9 07/15/2021 1903   MCHC 31.7 11/28/2023 1119   MCHC 32.9 06/13/2023 1216   RDW 12.5 11/28/2023 1119   Iron Studies    Component Value Date/Time   IRON 90 06/13/2023 1216   FERRITIN 12.6 06/13/2023 1216   Lipid Panel     Component Value Date/Time   CHOL 160 11/28/2023 1119   TRIG 146 11/28/2023 1119   HDL 31 (L) 11/28/2023 1119   CHOLHDL 5 06/13/2023 1216   VLDL 30.8 06/13/2023 1216   LDLCALC 103 (H) 11/28/2023 1119   LDLCALC 107 (H) 12/06/2020 1415   Hepatic Function Panel     Component Value Date/Time  PROT 5.5 (L) 11/28/2023 1119   ALBUMIN 3.5 (L) 11/28/2023 1119   AST 23 11/28/2023 1119   ALT 19 11/28/2023 1119   ALKPHOS 57 11/28/2023 1119   BILITOT 0.3 11/28/2023 1119   BILIDIR 0.1 01/03/2011 0844      Component Value Date/Time   TSH 3.790 11/28/2023 1119   Nutritional Lab Results  Component Value Date   VD25OH 42.4 11/28/2023   VD25OH 32.73 09/08/2022   VD25OH 58 12/06/2020    Medications: Outpatient Encounter Medications as of 01/24/2024  Medication Sig   tirzepatide  (ZEPBOUND ) 2.5 MG/0.5ML Pen Inject 2.5 mg into the skin once a week.    acetaminophen  (TYLENOL ) 500 MG tablet Take 650 mg by mouth every 6 (six) hours as needed.   ALPRAZolam  (XANAX ) 0.25 MG tablet Take 1 tablet by mouth twice daily as needed for anxiety   Ascorbic Acid (VITAMIN C ) 500 MG CAPS Take 1 tab daily   baclofen  (LIORESAL ) 10 MG tablet Take 0.5-1 tablets (5-10 mg total) by mouth 2 (two) times daily as needed for muscle spasms.   Cholecalciferol  (VITAMIN D ) 125 MCG (5000 UT) CAPS Take 125 mcg by mouth daily. (Patient not taking: Reported on 01/24/2024)   doxycycline  (VIBRA -TABS) 100 MG tablet Take 1 tablet (100 mg total) by mouth 2 (two) times daily. (Patient not taking: Reported on 11/28/2023)   nystatin -triamcinolone  ointment (MYCOLOG) Apply 1 Application topically 2 (two) times daily. (Patient not taking: Reported on 12/13/2023)   SYNTHROID  200 MCG tablet TAKE 1 TABLET BY MOUTH ONCE DAILY BEFORE BREAKFAST   triamterene -hydrochlorothiazide  (MAXZIDE -25) 37.5-25 MG tablet TAKE 1 TABLET ONE TIME DAILY FOR BLOOD PRESSURE   vitamin B-12 (CYANOCOBALAMIN ) 1000 MCG tablet Take 1,000 mcg by mouth daily.   Zinc  100 MG TABS Take 1 tab daily   [DISCONTINUED] UNABLE TO FIND JJ SMITH BLOOD SUGAR FOCUS JJ LIVER FOCUS BLUE SKY D3 WITH K2 (Patient not taking: Reported on 11/28/2023)   [DISCONTINUED] zinc  gluconate 50 MG tablet Take 50 mg by mouth daily. (Patient not taking: Reported on 12/13/2023)   No facility-administered encounter medications on file as of 01/24/2024.     Follow-Up   Return in about 3 weeks (around 02/14/2024) for For Weight Mangement with Dr. Francyne.[email or web address] She was informed of the importance of frequent follow up visits to maximize her success with intensive lifestyle modifications for her multiple health conditions.  Attestation Statement   Reviewed by clinician on day of visit: allergies, medications, problem list, medical history, surgical history, family history, social history, and previous encounter notes.       Lucas Francyne, MD

## 2024-01-25 NOTE — Assessment & Plan Note (Signed)
 Obesity with associated moderate obstructive sleep apnea, hypertension, prediabetes, and insulin  resistance Obesity is contributing to moderate obstructive sleep apnea, hypertension, prediabetes, and insulin  resistance. She has lost 18 pounds and adheres to a 1300 calorie diet 75-80% of the time. She participates in water aerobics three times a week for 60 minutes. Discussed Zepbound , an FDA-approved weight loss medication for patients with sleep apnea, which may also benefit blood pressure, insulin  resistance, cholesterol, prediabetes, inflammation, and reduce cardiovascular risks. Emphasized long-term use for sustained benefits and weight loss. A 15% weight loss is needed to improve sleep apnea. - Start Zepbound  with prior authorization process - Educate on injection technique for Zepbound  - Discuss potential side effects of Zepbound  including nausea, diarrhea, constipation, and incompatibility with fried, sugary, spicy foods, and alcohol - Advise to stay hydrated and maintain fiber intake to prevent constipation

## 2024-01-25 NOTE — Assessment & Plan Note (Signed)
 Blood pressure close to goal on triamterene  hydrochlorothiazide .  Reviewed most recent renal parameters which showed normal electrolytes and GFR.  Losing 10% of body weight may improve blood pressure control.  Starting treatment with Zepbound  will help lower blood pressure.

## 2024-01-25 NOTE — Assessment & Plan Note (Signed)
 I reviewed sleep study from February 2023 done at [facility] physicians sleep medicine that showed moderate sleep apnea with an AHI of 20.8 with severe desaturations of 74%.  She did not tolerate CPAP therapy and therefore remains symptomatic and condition remains untreated.  She has been educated on conditions associated with sleep apnea in the bidirectional relationship with weight.  She would benefit from weight loss aided by GLP-1 in this case Zepbound  2.5 mg once a week.

## 2024-01-25 NOTE — Assessment & Plan Note (Signed)
 Most recent A1c is  Lab Results  Component Value Date   HGBA1C 6.2 (H) 11/28/2023   HGBA1C 6.0 (H) 01/30/2014    Prediabetes with insulin  resistance, likely contributing to weight management challenges. Discussed the impact of high insulin  levels on weight gain and metabolism. Emphasized the importance of reducing carbohydrate intake to manage insulin  levels and support weight loss. Highlighted the role of insulin  in slowing metabolism and the need to focus on complex carbohydrates and whole foods. - Reduce carbohydrate intake, focusing on complex carbohydrates and whole foods. - Monitor carbohydrate intake to manage insulin  levels.  She had been on metformin  in the past but had GI side effects she is interested in GLP-1 which may be beneficial for patient.  Considering her clinical profile I recommend starting Zepbound  2.5 mg once a week for pharmacoprophylaxis.

## 2024-02-04 ENCOUNTER — Telehealth (INDEPENDENT_AMBULATORY_CARE_PROVIDER_SITE_OTHER): Payer: Self-pay | Admitting: Internal Medicine

## 2024-02-04 NOTE — Telephone Encounter (Signed)
 10/6 pt called saying her insurance doesn't cover Zepbound , says the Pharmacy sent something over asking to elaborate why she needs the script foe sleep App. Pt wants to know if there is another program she can do that will give her a discounted rate on the med. As she cannot pay 1000 out of pocket

## 2024-02-06 ENCOUNTER — Telehealth (INDEPENDENT_AMBULATORY_CARE_PROVIDER_SITE_OTHER): Payer: Self-pay

## 2024-02-06 NOTE — Telephone Encounter (Signed)
 PA - Zepbound 2.5 mg started

## 2024-02-07 NOTE — Telephone Encounter (Signed)
 PA - Zepbound  2.5 mg approved, pt notified

## 2024-02-13 ENCOUNTER — Other Ambulatory Visit: Payer: Self-pay | Admitting: Sports Medicine

## 2024-02-14 ENCOUNTER — Ambulatory Visit (INDEPENDENT_AMBULATORY_CARE_PROVIDER_SITE_OTHER): Admitting: Internal Medicine

## 2024-02-14 ENCOUNTER — Encounter (INDEPENDENT_AMBULATORY_CARE_PROVIDER_SITE_OTHER): Payer: Self-pay | Admitting: Internal Medicine

## 2024-02-14 VITALS — BP 132/84 | HR 87 | Temp 98.0°F | Ht 67.0 in | Wt 240.0 lb

## 2024-02-14 DIAGNOSIS — Z6837 Body mass index (BMI) 37.0-37.9, adult: Secondary | ICD-10-CM | POA: Diagnosis not present

## 2024-02-14 DIAGNOSIS — R7303 Prediabetes: Secondary | ICD-10-CM

## 2024-02-14 DIAGNOSIS — G4733 Obstructive sleep apnea (adult) (pediatric): Secondary | ICD-10-CM | POA: Diagnosis not present

## 2024-02-14 DIAGNOSIS — I10 Essential (primary) hypertension: Secondary | ICD-10-CM

## 2024-02-14 DIAGNOSIS — E66812 Obesity, class 2: Secondary | ICD-10-CM

## 2024-02-14 DIAGNOSIS — E88819 Insulin resistance, unspecified: Secondary | ICD-10-CM

## 2024-02-14 NOTE — Assessment & Plan Note (Signed)
 Obesity with associated hypertension, moderate obstructive sleep apnea, prediabetes with insulin  resistance, and hyperlipidemia Obesity management is ongoing with a focus on weight loss to improve associated conditions. She has lost one pound since the last visit and is following a 1300 calorie nutrition plan 90% of the time. She exercises 2-3 days a week with cardio. GLP-1 medication (Zepbound ) is approved to aid in weight loss, targeting a 15% reduction to improve sleep apnea. The medication reduces appetite, slows digestion, and provides a sense of fullness, aiding in weight loss and improving inflammation, prediabetes, blood pressure, and cholesterol. Potential side effects include nausea and constipation, managed with dietary adjustments. Emphasis on healthy eating and regular exercise is crucial for effective weight management. - Continue 1300 calorie nutrition plan. - Continue exercise regimen 2-3 days a week. - Initiated GLP-1 medication (Zepbound ) starting at 2.5 mg, with potential increase based on tolerance. - Monitor for side effects such as nausea and constipation. - Ensure adequate hydration and fiber intake to prevent constipation. - Scheduled follow-up in 3 weeks to assess medication tolerance and adjust dosage if necessary.

## 2024-02-14 NOTE — Assessment & Plan Note (Signed)
 Her HOMA-IR is 5.39 which is elevated. Optimal level < 1.9.   This is complex condition associated with genetics, ectopic fat and lifestyle factors. Insulin  resistance may result in increased fat storage, inhibition of the breakdown of fat, cause fluctuations in blood sugar leading to energy crashes and increased cravings for sugary or high carb foods and cause metabolic slowdown making it difficult to lose weight.  This may result in additional weight gain and lead to pre-diabetes and diabetes if untreated. In addition, hyperinsulinemia increases cardiovascular risk, chronic inflammatory response and may increase the risk of obesity related malignancies.  Lab Results  Component Value Date   HGBA1C 6.2 (H) 11/28/2023   Lab Results  Component Value Date   INSULIN  21.2 11/28/2023   Lab Results  Component Value Date   GLUCOSE 103 (H) 11/28/2023   GLUCOSE 121 (H) 04/26/2006    We reviewed treatment options which include losing 7 to 10% of body weight, increasing volume of physical activity and maintaining a diet low in saturated fats and with a low glycemic load.  Patient has also been educated on the carb insulin  model of obesity.  She is intolerant to metformin .  She will be starting Zepbound  for pharmacoprophylaxis

## 2024-02-14 NOTE — Progress Notes (Signed)
 Office: [phone]  /  Fax: [phone]  Weight Summary and Body Composition Analysis (BIA)  Vitals Temp: 98 F (36.7 C) BP: 132/84 Pulse Rate: 87 SpO2: 98 %   Anthropometric Measurements Height: 5' 7 (1.702 m) Weight: 240 lb (108.9 kg) BMI (Calculated): 37.58 Weight at Last Visit: 241 lb Weight Lost Since Last Visit: 1 lb Weight Gained Since Last Visit: 0 lb Starting Weight: 249 lb Total Weight Loss (lbs): 9 lb (4.082 kg) Peak Weight: 257 lb   Body Composition  Body Fat %: 48.4 % Fat Mass (lbs): 116.2 lbs Muscle Mass (lbs): 117.6 lbs Total Body Water (lbs): 81.6 lbs Visceral Fat Rating : 15    RMR: [id number]  Today's Visit #: 5  Starting Date: 11/28/23   Subjective   Chief Complaint: Obesity  Interval History Discussed the use of AI scribe software for clinical note transcription with the patient, who gave verbal consent to proceed.  History of Present Illness Kathleen Mcfarland is a 63 year old female with hypertension, moderate sleep apnea, prediabetes with insulin  resistance, and hyperlipidemia who presents for medical weight management.  Since her last visit, she has lost one pound and adheres to a 1300 calorie nutrition plan 90% of the time. She consumes more whole foods, meets her protein requirements, and engages in cardiovascular exercise two to three days a week for 60 minutes.  She had been prescribed Zepbound  for sleep apnea but has not started medication.  Her exercise routine includes two days of water aerobics and one day at the gym, focusing on strengthening exercises. She is unable to lift over five pounds due to past torn rotator cuffs but has increased her activity level since starting water aerobics.  She experiences osteoarthritis, which limits her ability to exercise daily. She listens to her body and rests as needed, noting that she feels 'lighter' and more active since beginning her exercise regimen.     Challenges affecting  patient progress: medical comorbidities and menopause.    Pharmacotherapy for weight management: She is currently taking Zepbound  but has not started yet.   Assessment and Plan   Treatment Plan For Obesity:  Recommended Dietary Goals  Kathleen Mcfarland is currently in the action stage of change. As such, her goal is to continue weight management plan. She has agreed to: continue current plan  Behavioral Health and Counseling  We discussed the following behavioral modification strategies today: continue to work on maintaining a reduced calorie state, getting the recommended amount of protein, incorporating whole foods, making healthy choices, staying well hydrated and practicing mindfulness when eating. and increase protein intake, fibrous foods (25 grams per day for women, 30 grams for men) and water to improve satiety and decrease hunger signals. .  Additional education and resources provided today: None  Recommended Physical Activity Goals  Kathleen Mcfarland has been advised to work up to 150 minutes of moderate intensity aerobic activity a week and strengthening exercises 2-3 times per week for cardiovascular health, weight loss maintenance and preservation of muscle mass.  She has agreed to :  Increase volume of physical activity to a goal of 240 minutes a week and Combine aerobic and strengthening exercises for efficiency and improved cardiometabolic health.  Medical Interventions and Pharmacotherapy  We discussed various medication options to help Kathleen Mcfarland with her weight loss efforts and we both agreed to : Start anti-obesity medication.  In addition to reduced calorie nutrition plan (RCNP), behavioral strategies and physical activity, Kathleen Mcfarland would benefit from pharmacotherapy to assist with hunger  signals, satiety and cravings. This will reduce obesity-related health risks by inducing weight loss, and help reduce food consumption and adherence to Kathleen Mcfarland) . It may also improve QOL by improving  self-confidence and reduce the  setbacks associated with metabolic adaptations.  After discussion of treatment options, mechanisms of action, benefits, side effects, contraindications and shared decision making she is agreeable to starting Zepbound  2.5mg  once a week. Patient also made aware that medication is indicated for long-term management of obesity and the risk of weight regain following discontinuation of treatment and hence the importance of adhering to medical weight loss plan.  We demonstrated use of device and patient using teach back method was able to demonstrate proper technique.  Associated Conditions Impacted by Obesity Treatment  Assessment & Plan HYPERTENSION, BENIGN ESSENTIAL  OSA (obstructive sleep apnea) -Moderate OSA ( HT 2/23 AHI-21/hr; O2 in-74%- 4 min O2 less than 89%)-OAT + weight loss I reviewed sleep study from February 2023 done at [facility] that showed moderate sleep apnea with an AHI of 20.8 with severe desaturations of 74%.  She did not tolerate CPAP therapy and therefore remains symptomatic and condition remains untreated.  She has been educated on conditions associated with sleep apnea in the bidirectional relationship with weight.  She would benefit from weight loss aided by GLP-1 in this case Zepbound  2.5 mg once a week. Insulin  resistance Pre-diabetes Her Kathleen Mcfarland is 5.39 which is elevated. Optimal level < 1.9.   This is complex condition associated with genetics, ectopic fat and lifestyle factors. Insulin  resistance may result in increased fat storage, inhibition of the breakdown of fat, cause fluctuations in blood sugar leading to energy crashes and increased cravings for sugary or high carb foods and cause metabolic slowdown making it difficult to lose weight.  This may result in additional weight gain and lead to pre-diabetes and diabetes if untreated. In addition, hyperinsulinemia increases cardiovascular risk, chronic inflammatory  response and may increase the risk of obesity related malignancies.  Lab Results  Component Value Date   HGBA1C 6.2 (H) 11/28/2023   Lab Results  Component Value Date   INSULIN  21.2 11/28/2023   Lab Results  Component Value Date   GLUCOSE 103 (H) 11/28/2023   GLUCOSE 121 (H) 04/26/2006    We reviewed treatment options which include losing 7 to 10% of body weight, increasing volume of physical activity and maintaining a diet low in saturated fats and with a low glycemic load.  Patient has also been educated on the carb insulin  model of obesity.  She is intolerant to metformin .  She will be starting Zepbound  for pharmacoprophylaxis  Class 2 severe obesity with serious comorbidity and body mass index (BMI) of 37.0 to 37.9 in adult, unspecified obesity type Obesity with associated hypertension, moderate obstructive sleep apnea, prediabetes with insulin  resistance, and hyperlipidemia Obesity management is ongoing with a focus on weight loss to improve associated conditions. She has lost one pound since the last visit and is following a 1300 calorie nutrition plan 90% of the time. She exercises 2-3 days a week with cardio. GLP-1 medication (Zepbound ) is approved to aid in weight loss, targeting a 15% reduction to improve sleep apnea. The medication reduces appetite, slows digestion, and provides a sense of fullness, aiding in weight loss and improving inflammation, prediabetes, blood pressure, and cholesterol. Potential side effects include nausea and constipation, managed with dietary adjustments. Emphasis on healthy eating and regular exercise is crucial for effective weight management. - Continue 1300 calorie nutrition plan. -  Continue exercise regimen 2-3 days a week. - Initiated GLP-1 medication (Zepbound ) starting at 2.5 mg, with potential increase based on tolerance. - Monitor for side effects such as nausea and constipation. - Ensure adequate hydration and fiber intake to prevent  constipation. - Scheduled follow-up in 3 weeks to assess medication tolerance and adjust dosage if necessary.         Objective   Physical Exam:  Blood pressure 132/84, pulse 87, temperature 98 F (36.7 C), height 5' 7 (1.702 m), weight 240 lb (108.9 kg), SpO2 98%. Body mass index is 37.59 kg/m.  General: She is overweight, cooperative, alert, well developed, and in no acute distress. PSYCH: Has normal mood, affect and thought process.   HEENT: EOMI, sclerae are anicteric. Lungs: Normal breathing effort, no conversational dyspnea. Extremities: No edema.  Neurologic: No gross sensory or motor deficits. No tremors or fasciculations noted.    Diagnostic Data Reviewed:  BMET    Component Value Date/Time   NA 138 11/28/2023 1119   K 4.1 11/28/2023 1119   CL 103 11/28/2023 1119   CO2 22 11/28/2023 1119   GLUCOSE 103 (H) 11/28/2023 1119   GLUCOSE 120 (H) 06/13/2023 1216   GLUCOSE 121 (H) 04/26/2006 1348   BUN 9 11/28/2023 1119   CREATININE 0.71 11/28/2023 1119   CREATININE 0.57 12/06/2020 1415   CALCIUM 8.8 11/28/2023 1119   GFRNONAA >60 07/15/2021 1903   GFRNONAA 77 11/30/2014 1144   GFRAA 111 12/22/2019 0913   GFRAA 89 11/30/2014 1144   Lab Results  Component Value Date   HGBA1C 6.2 (H) 11/28/2023   HGBA1C 6.0 (H) 01/30/2014   Lab Results  Component Value Date   INSULIN  21.2 11/28/2023   Lab Results  Component Value Date   TSH 3.790 11/28/2023   CBC    Component Value Date/Time   WBC 4.3 11/28/2023 1119   WBC 4.8 06/13/2023 1216   RBC 5.03 11/28/2023 1119   RBC 5.23 (H) 06/13/2023 1216   HGB 13.7 11/28/2023 1119   HCT 43.2 11/28/2023 1119   PLT 218 11/28/2023 1119   MCV 86 11/28/2023 1119   MCH 27.2 11/28/2023 1119   MCH 27.9 07/15/2021 1903   MCHC 31.7 11/28/2023 1119   MCHC 32.9 06/13/2023 1216   RDW 12.5 11/28/2023 1119   Iron Studies    Component Value Date/Time   IRON 90 06/13/2023 1216   FERRITIN 12.6 06/13/2023 1216   Lipid Panel      Component Value Date/Time   CHOL 160 11/28/2023 1119   TRIG 146 11/28/2023 1119   HDL 31 (L) 11/28/2023 1119   CHOLHDL 5 06/13/2023 1216   VLDL 30.8 06/13/2023 1216   LDLCALC 103 (H) 11/28/2023 1119   LDLCALC 107 (H) 12/06/2020 1415   Hepatic Function Panel     Component Value Date/Time   PROT 5.5 (L) 11/28/2023 1119   ALBUMIN 3.5 (L) 11/28/2023 1119   AST 23 11/28/2023 1119   ALT 19 11/28/2023 1119   ALKPHOS 57 11/28/2023 1119   BILITOT 0.3 11/28/2023 1119   BILIDIR 0.1 01/03/2011 0844      Component Value Date/Time   TSH 3.790 11/28/2023 1119   Nutritional Lab Results  Component Value Date   VD25OH 42.4 11/28/2023   VD25OH 32.73 09/08/2022   VD25OH 58 12/06/2020    Medications: Outpatient Encounter Medications as of 02/14/2024  Medication Sig   acetaminophen  (TYLENOL ) 500 MG tablet Take 650 mg by mouth every 6 (six) hours as needed.   ALPRAZolam  (  XANAX ) 0.25 MG tablet Take 1 tablet by mouth twice daily as needed for anxiety   Ascorbic Acid (VITAMIN C ) 500 MG CAPS Take 1 tab daily   baclofen  (LIORESAL ) 10 MG tablet Take 0.5-1 tablets (5-10 mg total) by mouth 2 (two) times daily as needed for muscle spasms.   ibuprofen  (ADVIL ) 800 MG tablet TAKE 1 TABLET BY MOUTH EVERY 8 HOURS AS NEEDED   SYNTHROID  200 MCG tablet TAKE 1 TABLET BY MOUTH ONCE DAILY BEFORE BREAKFAST   tirzepatide  (ZEPBOUND ) 2.5 MG/0.5ML Pen Inject 2.5 mg into the skin once a week.   triamterene -hydrochlorothiazide  (MAXZIDE -25) 37.5-25 MG tablet TAKE 1 TABLET ONE TIME DAILY FOR BLOOD PRESSURE   vitamin B-12 (CYANOCOBALAMIN ) 1000 MCG tablet Take 1,000 mcg by mouth daily.   Zinc  100 MG TABS Take 1 tab daily   [DISCONTINUED] Cholecalciferol  (VITAMIN D ) 125 MCG (5000 UT) CAPS Take 125 mcg by mouth daily. (Patient not taking: Reported on 01/24/2024)   [DISCONTINUED] doxycycline  (VIBRA -TABS) 100 MG tablet Take 1 tablet (100 mg total) by mouth 2 (two) times daily. (Patient not taking: Reported on 11/28/2023)    [DISCONTINUED] nystatin -triamcinolone  ointment (MYCOLOG) Apply 1 Application topically 2 (two) times daily. (Patient not taking: Reported on 12/13/2023)   No facility-administered encounter medications on file as of 02/14/2024.     Follow-Up   Return in about 4 weeks (around 03/13/2024) for For Weight Mangement with Dr. Francyne.SABRA She was informed of the importance of frequent follow up visits to maximize her success with intensive lifestyle modifications for her multiple health conditions.  Attestation Statement   Reviewed by clinician on day of visit: allergies, medications, problem list, medical history, surgical history, family history, social history, and previous encounter notes.     Lucas Francyne, MD

## 2024-02-14 NOTE — Assessment & Plan Note (Signed)
 I reviewed sleep study from February 2023 done at [facility] physicians sleep medicine that showed moderate sleep apnea with an AHI of 20.8 with severe desaturations of 74%.  She did not tolerate CPAP therapy and therefore remains symptomatic and condition remains untreated.  She has been educated on conditions associated with sleep apnea in the bidirectional relationship with weight.  She would benefit from weight loss aided by GLP-1 in this case Zepbound  2.5 mg once a week.

## 2024-02-20 ENCOUNTER — Other Ambulatory Visit: Payer: Self-pay | Admitting: Nurse Practitioner

## 2024-02-20 NOTE — Telephone Encounter (Unsigned)
 Copied from CRM [id number]. Topic: Clinical - Medication Refill >> Feb 20, 2024 12:41 PM Rea ORN wrote: Medication:  cetirizine  10 mg OTC, once daily   Has the patient contacted their pharmacy? Yes (Agent: If no, request that the patient contact the pharmacy for the refill. If patient does not wish to contact the pharmacy document the reason why and proceed with request.) (Agent: If yes, when and what did the pharmacy advise?)  This is the patient's preferred pharmacy:  [facility] [address] [place], [place] - [address] [place] [address] Phone: [phone] Fax: [phone]  Is this the correct pharmacy for this prescription? Yes If no, delete pharmacy and type the correct one.   Has the prescription been filled recently? No  Is the patient out of the medication? Yes  Has the patient been seen for an appointment in the last year OR does the patient have an upcoming appointment? Yes  Can we respond through MyChart? Yes  Agent: Please be advised that Rx refills may take up to 3 business days. We ask that you follow-up with your pharmacy.

## 2024-02-22 MED ORDER — CETIRIZINE HCL 10 MG PO TABS: 10.0000 mg | ORAL_TABLET | Freq: Every day | ORAL | 3 refills | Status: AC

## 2024-02-22 NOTE — Telephone Encounter (Signed)
 Looks like this may have been discontinued at some point. If she wants to restart it that is not problem. Refill sent pharmacy please let patient know.

## 2024-02-22 NOTE — Telephone Encounter (Signed)
 Copied from CRM [id number]. Topic: Clinical - Medication Question >> Feb 22, 2024 12:11 PM Anairis L wrote: Reason for CRM: Following up on cetirizine  10 mg OTC, once daily.Upset would like a call back.   SABRA

## 2024-03-03 ENCOUNTER — Encounter: Payer: Self-pay | Admitting: Radiology

## 2024-03-10 ENCOUNTER — Encounter (INDEPENDENT_AMBULATORY_CARE_PROVIDER_SITE_OTHER): Payer: Self-pay | Admitting: Internal Medicine

## 2024-03-10 ENCOUNTER — Ambulatory Visit (INDEPENDENT_AMBULATORY_CARE_PROVIDER_SITE_OTHER): Payer: Self-pay | Admitting: Internal Medicine

## 2024-03-10 VITALS — BP 148/76 | HR 91 | Temp 98.3°F | Ht 67.0 in | Wt 240.0 lb

## 2024-03-10 DIAGNOSIS — E7800 Pure hypercholesterolemia, unspecified: Secondary | ICD-10-CM

## 2024-03-10 DIAGNOSIS — E66812 Obesity, class 2: Secondary | ICD-10-CM | POA: Diagnosis not present

## 2024-03-10 DIAGNOSIS — I10 Essential (primary) hypertension: Secondary | ICD-10-CM

## 2024-03-10 DIAGNOSIS — R7303 Prediabetes: Secondary | ICD-10-CM | POA: Diagnosis not present

## 2024-03-10 DIAGNOSIS — G4733 Obstructive sleep apnea (adult) (pediatric): Secondary | ICD-10-CM

## 2024-03-10 DIAGNOSIS — Z6837 Body mass index (BMI) 37.0-37.9, adult: Secondary | ICD-10-CM | POA: Diagnosis not present

## 2024-03-10 MED ORDER — TIRZEPATIDE-WEIGHT MANAGEMENT 5 MG/0.5ML ~~LOC~~ SOAJ: 5.0000 mg | SUBCUTANEOUS | 0 refills | Status: DC

## 2024-03-10 NOTE — Assessment & Plan Note (Signed)
 I reviewed sleep study from February 2023 done at [facility] that showed moderate sleep apnea with an AHI of 20.8 with severe desaturations of 74%.  She did not tolerate CPAP therapy and therefore remains symptomatic and condition remains untreated.  She has been educated on conditions associated with sleep apnea in the bidirectional relationship with weight.  Continue with nutritional behavioral strategies and weight loss aided by GLP-1.  Zepbound  will be increased to 5 mg once a week.

## 2024-03-10 NOTE — Assessment & Plan Note (Signed)
 Her HOMA-IR is 5.39 which is elevated. Optimal level < 1.9.   This is complex condition associated with genetics, ectopic fat and lifestyle factors. Insulin  resistance may result in increased fat storage, inhibition of the breakdown of fat, cause fluctuations in blood sugar leading to energy crashes and increased cravings for sugary or high carb foods and cause metabolic slowdown making it difficult to lose weight.  This may result in additional weight gain and lead to pre-diabetes and diabetes if untreated. In addition, hyperinsulinemia increases cardiovascular risk, chronic inflammatory response and may increase the risk of obesity related malignancies.  Lab Results  Component Value Date   HGBA1C 6.2 (H) 11/28/2023   Lab Results  Component Value Date   INSULIN  21.2 11/28/2023   Lab Results  Component Value Date   GLUCOSE 103 (H) 11/28/2023   GLUCOSE 121 (H) 04/26/2006    We reviewed treatment options which include losing 7 to 10% of body weight, increasing volume of physical activity and maintaining a diet low in saturated fats and with a low glycemic load.  Patient has also been educated on the carb insulin  model of obesity.  She is intolerant to metformin .  In addition to nutrition and behavioral strategies she benefits from GLP-1.  She is now on Zepbound  medication will be increased to 5 mg once a week

## 2024-03-10 NOTE — Progress Notes (Signed)
 Office: [phone]  /  Fax: [phone]  Weight Summary and Body Composition Analysis (BIA)  Vitals Temp: 98.3 F (36.8 C) BP: (!) 148/76 Pulse Rate: 91 SpO2: 98 %   Anthropometric Measurements Height: 5' 7 (1.702 m) Weight: 240 lb (108.9 kg) BMI (Calculated): 37.58 Weight at Last Visit: 240 lb Weight Lost Since Last Visit: 0 lb Weight Gained Since Last Visit: 0 lb Starting Weight: 249 lb Total Weight Loss (lbs): 9 lb (4.082 kg) Peak Weight: 257 lb   Body Composition  Body Fat %: 48.4 % Fat Mass (lbs): 116.2 lbs Muscle Mass (lbs): 117.8 lbs Total Body Water (lbs): 84 lbs Visceral Fat Rating : 15     RMR: 1843  Today's Visit #: 6  Starting Date: 11/28/23   Subjective   Chief Complaint: Obesity  Interval History Discussed the use of AI scribe software for clinical note transcription with the patient, who gave verbal consent to proceed.  History of Present Illness Kathleen Mcfarland is a 63 year old female with hypertension and sleep apnea who presents for medical weight management.  Since last office visit she has maintained.  She follows a 1300 calorie nutrition plan 85% of the time and has been taking a medication she refers to as 'the shot', which has increased her appetite, especially after water aerobics. Her clothes are fitting looser, and she reports a reduction in clothing size from 3X to large/extra large and from size 18-20 to 14-16.  She experiences constipation as a Kathleen effect of her medication and uses stool softeners to manage it. Increased water intake and fiber consumption have improved her bowel movements to daily. She takes baclofen  and Zyrtec  regularly, which may contribute to constipation.  She has a history of a neck mass, which has been evaluated with CT scans to rule out cancer; she reports allergy problems and sinus infections in the past. She underwent a thyroidectomy due to an overactive thyroid .  CT did not show any  masses.  She has prediabetes and is on a medication regimen that includes Maxzide  for hypertension. She takes half a pill at times due to Kathleen Mcfarland. She takes ibuprofen  800 mg twice a week.  Her physical activity includes water aerobics and gym workouts, totaling 120 minutes a week. She has lost weight prior to starting the current program and continues to notice changes in her body composition.     Challenges affecting patient progress: medical comorbidities and menopause.    Pharmacotherapy for weight management: She is currently taking Zepbound  with adequate clinical response  and experiencing the following Kathleen Mcfarland: constipation.   Assessment and Plan   Treatment Plan For Obesity:  Recommended Dietary Goals  Kathleen Mcfarland is currently in the Mcfarland stage of change. As such, her goal is to continue weight management plan. She has agreed to: continue current plan  Behavioral Health and Counseling  We discussed the following behavioral modification strategies today: work on tracking and journaling calories using tracking application, continue to work on maintaining a reduced calorie state, getting the recommended amount of protein, incorporating whole foods, making healthy choices, staying well hydrated and practicing mindfulness when eating., and increase protein intake, fibrous foods (25 grams per day for women, 30 grams for men) and water to improve satiety and decrease hunger signals. .  Additional education and resources provided today: Handout and personalized instruction on tracking and journaling using Apps and Handout on traveling and holiday eating strategies  Recommended Physical Activity Goals  Kathleen Mcfarland has been advised  to work up to 150 minutes of moderate intensity aerobic activity a week and strengthening exercises 2-3 times per week for cardiovascular health, weight loss maintenance and preservation of muscle mass.  She has agreed to :  Increase volume of physical  activity to a goal of 240 minutes a week and Combine aerobic and strengthening exercises for efficiency and improved cardiometabolic health.  Medical Interventions and Pharmacotherapy  We discussed various medication Mcfarland to help Kathleen Mcfarland with her weight loss efforts and we both agreed to : Start anti-obesity medication.  In addition to reduced calorie nutrition plan (RCNP), behavioral strategies and physical activity, Kathleen Mcfarland would benefit from pharmacotherapy to assist with hunger signals, satiety and cravings. This will reduce obesity-related health risks by inducing weight loss, and help reduce food consumption and adherence to Kathleen Mcfarland, Kathleen Mcfarland, Kathleen Mcfarland, Kathleen Mcfarland, Kathleen and shared decision making she is agreeable to starting Zepbound  2.5mg  once a week. Patient also made aware that medication is indicated for long-term management of obesity and the risk of weight regain following discontinuation of treatment and hence the importance of adhering to medical weight loss plan.  We demonstrated use of device and patient using teach back method was able to demonstrate proper technique.  Associated Conditions Impacted by Obesity Treatment  Assessment & Plan Pre-diabetes Her HOMA-IR is 5.39 which is elevated. Optimal level < 1.9.   This is complex condition associated with genetics, ectopic fat and lifestyle factors. Insulin  resistance may result in increased fat storage, inhibition of the breakdown of fat, cause fluctuations in blood sugar leading to energy crashes and increased cravings for sugary or high carb foods and cause metabolic slowdown making it difficult to lose weight.  This may result in additional weight gain and lead to pre-diabetes and diabetes if untreated. In addition, hyperinsulinemia increases  cardiovascular risk, chronic inflammatory response and may increase the risk of obesity related malignancies.  Lab Results  Component Value Date   HGBA1C 6.2 (H) 11/28/2023   Lab Results  Component Value Date   INSULIN  21.2 11/28/2023   Lab Results  Component Value Date   GLUCOSE 103 (H) 11/28/2023   GLUCOSE 121 (H) 04/26/2006    We reviewed treatment Mcfarland which include losing 7 to 10% of body weight, increasing volume of physical activity and maintaining a diet low in saturated fats and with a low glycemic load.  Patient has also been educated on the carb insulin  model of obesity.  She is intolerant to metformin .  In addition to nutrition and behavioral strategies she Kathleen Mcfarland from GLP-1.  She is now on Zepbound  medication will be increased to 5 mg once a week  Class 2 severe obesity with serious comorbidity and body mass index (BMI) of 37.0 to 37.9 in adult, unspecified obesity type Obesity management with weight loss of 9 pounds.  Increased appetite likely due to medication and increased physical activity. Constipation as a Kathleen effect of medication. - Increased Zepbound  to 5 mg weekly. - Encouraged tracking of food intake and physical activity using a mobile app. - Advised increasing physical activity to 240 minutes per week, including 2 days in the gym and 2 days in water aerobics. - Provided handouts on high fiber foods and staying on track during holidays. - Advised increasing fiber and water intake to manage constipation. - Recommended using Miralax if constipation persists. OSA (obstructive sleep  apnea) I reviewed sleep study from February 2023 done at [facility] that showed moderate sleep apnea with an AHI of 20.8 with severe desaturations of 74%.  She did not tolerate CPAP therapy and therefore remains symptomatic and condition remains untreated.  She has been educated on conditions associated with sleep apnea in the bidirectional relationship with  weight.  Continue with nutritional behavioral strategies and weight loss aided by GLP-1.  Zepbound  will be increased to 5 mg once a week. HYPERTENSION, BENIGN ESSENTIAL Blood pressure remains elevated. Current medication regimen includes Maxzide , taken inconsistently due to Kathleen Mcfarland. Zepbound  may aid in blood pressure reduction. High-dose ibuprofen  use may contribute to elevated blood pressure and kidney issues. - Monitor blood pressure at home and report to PCP if elevated. - Discuss potential medication adjustment with PCP. - Discontinue high-dose ibuprofen  due to uncontrolled blood pressure..          Objective   Physical Exam:  Blood pressure (!) 148/76, pulse 91, temperature 98.3 F (36.8 C), height 5' 7 (1.702 m), weight 240 lb (108.9 kg), SpO2 98%. Body mass index is 37.59 kg/m.  General: She is overweight, cooperative, alert, well developed, and in no acute distress. PSYCH: Has normal mood, affect and thought process.   HEENT: EOMI, sclerae are anicteric. Lungs: Normal breathing effort, no conversational dyspnea. Extremities: No edema.  Neurologic: No gross sensory or motor deficits. No tremors or fasciculations noted.    Diagnostic Data Reviewed:  BMET    Component Value Date/Time   NA 138 11/28/2023 1119   K 4.1 11/28/2023 1119   CL 103 11/28/2023 1119   CO2 22 11/28/2023 1119   GLUCOSE 103 (H) 11/28/2023 1119   GLUCOSE 120 (H) 06/13/2023 1216   GLUCOSE 121 (H) 04/26/2006 1348   BUN 9 11/28/2023 1119   CREATININE 0.71 11/28/2023 1119   CREATININE 0.57 12/06/2020 1415   CALCIUM 8.8 11/28/2023 1119   GFRNONAA >60 07/15/2021 1903   GFRNONAA 77 11/30/2014 1144   GFRAA 111 12/22/2019 0913   GFRAA 89 11/30/2014 1144   Lab Results  Component Value Date   HGBA1C 6.2 (H) 11/28/2023   HGBA1C 6.0 (H) 01/30/2014   Lab Results  Component Value Date   INSULIN  21.2 11/28/2023   Lab Results  Component Value Date   TSH 3.790 11/28/2023   CBC    Component  Value Date/Time   WBC 4.3 11/28/2023 1119   WBC 4.8 06/13/2023 1216   RBC 5.03 11/28/2023 1119   RBC 5.23 (H) 06/13/2023 1216   HGB 13.7 11/28/2023 1119   HCT 43.2 11/28/2023 1119   PLT 218 11/28/2023 1119   MCV 86 11/28/2023 1119   MCH 27.2 11/28/2023 1119   MCH 27.9 07/15/2021 1903   MCHC 31.7 11/28/2023 1119   MCHC 32.9 06/13/2023 1216   RDW 12.5 11/28/2023 1119   Iron Studies    Component Value Date/Time   IRON 90 06/13/2023 1216   FERRITIN 12.6 06/13/2023 1216   Lipid Panel     Component Value Date/Time   CHOL 160 11/28/2023 1119   TRIG 146 11/28/2023 1119   HDL 31 (L) 11/28/2023 1119   CHOLHDL 5 06/13/2023 1216   VLDL 30.8 06/13/2023 1216   LDLCALC 103 (H) 11/28/2023 1119   LDLCALC 107 (H) 12/06/2020 1415   Hepatic Function Panel     Component Value Date/Time   PROT 5.5 (L) 11/28/2023 1119   ALBUMIN 3.5 (L) 11/28/2023 1119   AST 23 11/28/2023 1119  ALT 19 11/28/2023 1119   ALKPHOS 57 11/28/2023 1119   BILITOT 0.3 11/28/2023 1119   BILIDIR 0.1 01/03/2011 0844      Component Value Date/Time   TSH 3.790 11/28/2023 1119   Nutritional Lab Results  Component Value Date   VD25OH 42.4 11/28/2023   VD25OH 32.73 09/08/2022   VD25OH 58 12/06/2020    Medications: Outpatient Encounter Medications as of 03/10/2024  Medication Sig   acetaminophen  (TYLENOL ) 500 MG tablet Take 650 mg by mouth every 6 (six) hours as needed.   ALPRAZolam  (XANAX ) 0.25 MG tablet Take 1 tablet by mouth twice daily as needed for anxiety   Ascorbic Acid (VITAMIN C ) 500 MG CAPS Take 1 tab daily   baclofen  (LIORESAL ) 10 MG tablet Take 0.5-1 tablets (5-10 mg total) by mouth 2 (two) times daily as needed for muscle spasms.   cetirizine  (ZYRTEC  ALLERGY) 10 MG tablet Take 1 tablet (10 mg total) by mouth daily.   ibuprofen  (ADVIL ) 800 MG tablet TAKE 1 TABLET BY MOUTH EVERY 8 HOURS AS NEEDED   SYNTHROID  200 MCG tablet TAKE 1 TABLET BY MOUTH ONCE DAILY BEFORE BREAKFAST   tirzepatide  (ZEPBOUND )  5 MG/0.5ML Pen Inject 5 mg into the skin once a week.   triamterene -hydrochlorothiazide  (MAXZIDE -25) 37.5-25 MG tablet TAKE 1 TABLET Kathleen TIME DAILY FOR BLOOD PRESSURE   vitamin B-12 (CYANOCOBALAMIN ) 1000 MCG tablet Take 1,000 mcg by mouth daily.   Zinc  100 MG TABS Take 1 tab daily   [DISCONTINUED] tirzepatide  (ZEPBOUND ) 2.5 MG/0.5ML Pen Inject 2.5 mg into the skin once a week.   No facility-administered encounter medications on file as of 03/10/2024.     Follow-Up   Return in about 4 weeks (around 04/07/2024) for For Weight Mangement with Dr. Francyne.SABRA She was informed of the importance of frequent follow up visits to maximize her success with intensive lifestyle modifications for her multiple health conditions.  Attestation Statement   Reviewed by clinician on day of visit: allergies, medications, problem list, medical history, surgical history, family history, social history, and previous encounter notes.     Lucas Francyne, MD

## 2024-03-10 NOTE — Assessment & Plan Note (Signed)
 Blood pressure remains elevated. Current medication regimen includes Maxzide , taken inconsistently due to side effects. Zepbound  may aid in blood pressure reduction. High-dose ibuprofen  use may contribute to elevated blood pressure and kidney issues. - Monitor blood pressure at home and report to PCP if elevated. - Discuss potential medication adjustment with PCP. - Discontinue high-dose ibuprofen  due to uncontrolled blood pressure.SABRA

## 2024-03-10 NOTE — Assessment & Plan Note (Signed)
 Obesity management with weight loss of 9 pounds.  Increased appetite likely due to medication and increased physical activity. Constipation as a side effect of medication. - Increased Zepbound  to 5 mg weekly. - Encouraged tracking of food intake and physical activity using a mobile app. - Advised increasing physical activity to 240 minutes per week, including 2 days in the gym and 2 days in water aerobics. - Provided handouts on high fiber foods and staying on track during holidays. - Advised increasing fiber and water intake to manage constipation. - Recommended using Miralax if constipation persists.

## 2024-04-07 ENCOUNTER — Other Ambulatory Visit: Payer: Self-pay | Admitting: Family

## 2024-04-07 ENCOUNTER — Other Ambulatory Visit: Payer: Self-pay | Admitting: Sports Medicine

## 2024-04-07 DIAGNOSIS — I10 Essential (primary) hypertension: Secondary | ICD-10-CM

## 2024-04-08 ENCOUNTER — Ambulatory Visit (INDEPENDENT_AMBULATORY_CARE_PROVIDER_SITE_OTHER): Admitting: Internal Medicine

## 2024-04-08 ENCOUNTER — Encounter (INDEPENDENT_AMBULATORY_CARE_PROVIDER_SITE_OTHER): Payer: Self-pay | Admitting: Internal Medicine

## 2024-04-08 VITALS — BP 140/75 | HR 95 | Temp 98.2°F | Ht 67.0 in | Wt 236.0 lb

## 2024-04-08 DIAGNOSIS — I10 Essential (primary) hypertension: Secondary | ICD-10-CM

## 2024-04-08 DIAGNOSIS — Z6837 Body mass index (BMI) 37.0-37.9, adult: Secondary | ICD-10-CM

## 2024-04-08 DIAGNOSIS — G4733 Obstructive sleep apnea (adult) (pediatric): Secondary | ICD-10-CM

## 2024-04-08 DIAGNOSIS — R7303 Prediabetes: Secondary | ICD-10-CM | POA: Diagnosis not present

## 2024-04-08 DIAGNOSIS — E66812 Obesity, class 2: Secondary | ICD-10-CM | POA: Diagnosis not present

## 2024-04-08 MED ORDER — TIRZEPATIDE-WEIGHT MANAGEMENT 5 MG/0.5ML ~~LOC~~ SOAJ: 5.0000 mg | SUBCUTANEOUS | 0 refills | Status: DC

## 2024-04-08 NOTE — Assessment & Plan Note (Signed)
 I reviewed sleep study from February 2023 done at [facility] that showed moderate sleep apnea with an AHI of 20.8 with severe desaturations of 74%.  She did not tolerate CPAP therapy and therefore remains symptomatic and condition remains untreated.  She has been educated on conditions associated with sleep apnea in the bidirectional relationship with weight.  Continue with nutritional behavioral strategies and weight loss aided by GLP-1.  Zepbound  will be increased to 5 mg once a week.

## 2024-04-08 NOTE — Assessment & Plan Note (Signed)
 Blood pressure remains elevated. Current medication regimen includes Maxzide , taken inconsistently due to side effects. Zepbound  may aid in blood pressure reduction.  Since last visit she has stopped taking high-dose NSAIDs. - Monitor blood pressure at home and report to PCP if elevated. - Discuss potential medication adjustment with PCP. - Discontinue high-dose ibuprofen  due to uncontrolled blood pressure.Kathleen Mcfarland

## 2024-04-08 NOTE — Assessment & Plan Note (Signed)
 Her HOMA-IR is 5.39 which is elevated. Optimal level < 1.9.  She has an A1c of 6.2.  This is complex condition associated with genetics, ectopic fat and lifestyle factors. Insulin  resistance may result in increased fat storage, inhibition of the breakdown of fat, cause fluctuations in blood sugar leading to energy crashes and increased cravings for sugary or high carb foods and cause metabolic slowdown making it difficult to lose weight.  This may result in additional weight gain and lead to pre-diabetes and diabetes if untreated. In addition, hyperinsulinemia increases cardiovascular risk, chronic inflammatory response and may increase the risk of obesity related malignancies.  Lab Results  Component Value Date   HGBA1C 6.2 (H) 11/28/2023   Lab Results  Component Value Date   INSULIN  21.2 11/28/2023   Lab Results  Component Value Date   GLUCOSE 103 (H) 11/28/2023   GLUCOSE 121 (H) 04/26/2006    We reviewed treatment options which include losing 7 to 10% of body weight, increasing volume of physical activity and maintaining a diet low in saturated fats and with a low glycemic load.  Patient has also been educated on the carb insulin  model of obesity.  She is intolerant to metformin .  In addition to nutrition and behavioral strategies she will continue on Zepbound 

## 2024-04-08 NOTE — Progress Notes (Signed)
 Office: [phone]  /  Fax: [phone]  Weight Summary and Body Composition Analysis (BIA)  Vitals Temp: 98.2 F (36.8 C) BP: (!) 140/75 Pulse Rate: 95 SpO2: 100 %   Anthropometric Measurements Height: 5' 7 (1.702 m) Weight: 236 lb (107 kg) BMI (Calculated): 36.95 Weight at Last Visit: 240 lb Weight Lost Since Last Visit: 4 lb Weight Gained Since Last Visit: 0 lb Starting Weight: 249 lb Total Weight Loss (lbs): 13 lb (5.897 kg) Peak Weight: 257 lb   Body Composition  Body Fat %: 47.6 % Fat Mass (lbs): 112.6 lbs Muscle Mass (lbs): 117.6 lbs Total Body Water (lbs): 80 lbs Visceral Fat Rating : 14    RMR: 1843  Today's Visit #: 7  Starting Date: 11/28/23   Subjective   Chief Complaint: Obesity  Interval History Discussed the use of AI scribe software for clinical note transcription with the patient, who gave verbal consent to proceed.  History of Present Illness Kathleen Mcfarland is a 63 year old female with obesity, sleep apnea, hypertension, and prediabetes who presents for weight management follow-up.  Since last office visit she has lost 4 pounds.  She is following a 1300-calorie target about 95% of the time she is exercising 2 to 3 days a week about 60 minutes doing mostly cardio.  She has lost 13 pounds total and currently weighs 236 pounds. She reports that her weight loss is due to portion control and dietary changes, such as eating smaller portions and choosing healthier options. She uses a bowl instead of a plate to help with portion control and is mindful of not overeating when she feels full.  Initially, she experienced constipation but managed it by increasing her intake of fruits, apple juice, water, vegetables, flax seeds, and chia seeds. She regularly consumes oatmeal and blueberries and has reduced her banana intake due to its sugar content. She drinks protein shakes to aid fiber intake and reports no current issues with  constipation.  She is concerned about loose skin, particularly around her neck, as a result of weight loss. She notes that she has always had a full face and neck, and the changes are noticeable to her. She is worried about the appearance of wrinkles and loose skin.  Her medical history includes sleep apnea, hypertension, and prediabetes, with her last recorded A1c at 6.2. She is on Zepbound  5 mg dose of medication for weight management and reports that she does not overeat and feels full with smaller portions.  Her exercise routine includes water aerobics and gym workouts. She plans to return to water aerobics once she acquires a diving suit to prevent getting cold. She prefers exercising in warmer water due to arthritis and plans to switch to a facility with an 86-degree pool. She exercises regularly despite her disability and has reduced her ibuprofen  intake.     Challenges affecting patient progress: orthopedic problems, medical conditions or chronic pain affecting mobility and medical comorbidities.    Pharmacotherapy for weight management: She is currently taking Zepbound  with adequate clinical response  and without side effects..   Assessment and Plan   Treatment Plan For Obesity:  Recommended Dietary Goals  Kathleen Mcfarland is currently in the action stage of change. As such, her goal is to continue weight management plan. She has agreed to: continue current plan  Behavioral Health and Counseling  We discussed the following behavioral modification strategies today: continue to work on maintaining a reduced calorie state, getting the recommended amount of protein, incorporating  whole foods, making healthy choices, staying well hydrated and practicing mindfulness when eating. and increase protein intake, fibrous foods (25 grams per day for women, 30 grams for men) and water to improve satiety and decrease hunger signals. .  Additional education and resources provided today: Handout on  traveling and holiday eating strategies  Recommended Physical Activity Goals  Kathleen Mcfarland has been advised to work up to 150 minutes of moderate intensity aerobic activity a week and strengthening exercises 2-3 times per week for cardiovascular health, weight loss maintenance and preservation of muscle mass.  She has agreed to :  Think about enjoyable ways to increase daily physical activity and overcoming barriers to exercise, Increase physical activity in their day and reduce sedentary time (increase NEAT)., Increase volume of physical activity to a goal of 240 minutes a week, and Combine aerobic and strengthening exercises for efficiency and improved cardiometabolic health.  Medical Interventions and Pharmacotherapy  We discussed various medication options to help Kathleen Mcfarland with her weight loss efforts and we both agreed to : Adequate clinical response to anti-obesity medication, continue current regimen  Associated Conditions Impacted by Obesity Treatment  Assessment & Plan Pre-diabetes Her HOMA-IR is 5.39 which is elevated. Optimal level < 1.9.  She has an A1c of 6.2.  This is complex condition associated with genetics, ectopic fat and lifestyle factors. Insulin  resistance may result in increased fat storage, inhibition of the breakdown of fat, cause fluctuations in blood sugar leading to energy crashes and increased cravings for sugary or high carb foods and cause metabolic slowdown making it difficult to lose weight.  This may result in additional weight gain and lead to pre-diabetes and diabetes if untreated. In addition, hyperinsulinemia increases cardiovascular risk, chronic inflammatory response and may increase the risk of obesity related malignancies.  Lab Results  Component Value Date   HGBA1C 6.2 (H) 11/28/2023   Lab Results  Component Value Date   INSULIN  21.2 11/28/2023   Lab Results  Component Value Date   GLUCOSE 103 (H) 11/28/2023   GLUCOSE 121 (H) 04/26/2006    We  reviewed treatment options which include losing 7 to 10% of body weight, increasing volume of physical activity and maintaining a diet low in saturated fats and with a low glycemic load.  Patient has also been educated on the carb insulin  model of obesity.  She is intolerant to metformin .  In addition to nutrition and behavioral strategies she will continue on Zepbound   Class 2 severe obesity with serious comorbidity and body mass index (BMI) of 37.0 to 37.9 in adult, unspecified obesity type Significant weight loss of 13 pounds, now weighing 236 pounds. Improved appetite control and portion management. Initial constipation managed with dietary adjustments. Concerns about skin laxity due to weight loss, particularly in the neck area. Discussed potential for contouring surgery after reaching goal weight and maintaining it for six months. - Continue current medication at 5 mg dose for another month. - Maintain protein intake of 30 grams three times a day. - Encouraged hydration and adequate protein intake to prevent skin laxity. - Advised on strengthening exercises to maintain muscle tone. - Discussed potential for contouring surgery after reaching goal weight and maintaining it for six months. OSA (obstructive sleep apnea) I reviewed sleep study from February 2023 done at [facility] physicians sleep medicine that showed moderate sleep apnea with an AHI of 20.8 with severe desaturations of 74%.  She did not tolerate CPAP therapy and therefore remains symptomatic and condition remains untreated.  She has  been educated on conditions associated with sleep apnea in the bidirectional relationship with weight.  Continue with nutritional behavioral strategies and weight loss aided by GLP-1.  Zepbound  will be increased to 5 mg once a week. HYPERTENSION, BENIGN ESSENTIAL Blood pressure remains elevated. Current medication regimen includes Maxzide , taken inconsistently due to side effects. Zepbound  may aid in blood  pressure reduction.  Since last visit she has stopped taking high-dose NSAIDs. - Monitor blood pressure at home and report to PCP if elevated. - Discuss potential medication adjustment with PCP. - Discontinue high-dose ibuprofen  due to uncontrolled blood pressure..        Objective   Physical Exam:  Blood pressure (!) 140/75, pulse 95, temperature 98.2 F (36.8 C), height 5' 7 (1.702 m), weight 236 lb (107 kg), SpO2 100%. Body mass index is 36.96 kg/m.  General: She is overweight, cooperative, alert, well developed, and in no acute distress. PSYCH: Has normal mood, affect and thought process.   HEENT: EOMI, sclerae are anicteric. Lungs: Normal breathing effort, no conversational dyspnea. Extremities: No edema.  Neurologic: No gross sensory or motor deficits. No tremors or fasciculations noted.    Diagnostic Data Reviewed:  BMET    Component Value Date/Time   NA 138 11/28/2023 1119   K 4.1 11/28/2023 1119   CL 103 11/28/2023 1119   CO2 22 11/28/2023 1119   GLUCOSE 103 (H) 11/28/2023 1119   GLUCOSE 120 (H) 06/13/2023 1216   GLUCOSE 121 (H) 04/26/2006 1348   BUN 9 11/28/2023 1119   CREATININE 0.71 11/28/2023 1119   CREATININE 0.57 12/06/2020 1415   CALCIUM 8.8 11/28/2023 1119   GFRNONAA >60 07/15/2021 1903   GFRNONAA 77 11/30/2014 1144   GFRAA 111 12/22/2019 0913   GFRAA 89 11/30/2014 1144   Lab Results  Component Value Date   HGBA1C 6.2 (H) 11/28/2023   HGBA1C 6.0 (H) 01/30/2014   Lab Results  Component Value Date   INSULIN  21.2 11/28/2023   Lab Results  Component Value Date   TSH 3.790 11/28/2023   CBC    Component Value Date/Time   WBC 4.3 11/28/2023 1119   WBC 4.8 06/13/2023 1216   RBC 5.03 11/28/2023 1119   RBC 5.23 (H) 06/13/2023 1216   HGB 13.7 11/28/2023 1119   HCT 43.2 11/28/2023 1119   PLT 218 11/28/2023 1119   MCV 86 11/28/2023 1119   MCH 27.2 11/28/2023 1119   MCH 27.9 07/15/2021 1903   MCHC 31.7 11/28/2023 1119   MCHC 32.9 06/13/2023  1216   RDW 12.5 11/28/2023 1119   Iron Studies    Component Value Date/Time   IRON 90 06/13/2023 1216   FERRITIN 12.6 06/13/2023 1216   Lipid Panel     Component Value Date/Time   CHOL 160 11/28/2023 1119   TRIG 146 11/28/2023 1119   HDL 31 (L) 11/28/2023 1119   CHOLHDL 5 06/13/2023 1216   VLDL 30.8 06/13/2023 1216   LDLCALC 103 (H) 11/28/2023 1119   LDLCALC 107 (H) 12/06/2020 1415   Hepatic Function Panel     Component Value Date/Time   PROT 5.5 (L) 11/28/2023 1119   ALBUMIN 3.5 (L) 11/28/2023 1119   AST 23 11/28/2023 1119   ALT 19 11/28/2023 1119   ALKPHOS 57 11/28/2023 1119   BILITOT 0.3 11/28/2023 1119   BILIDIR 0.1 01/03/2011 0844      Component Value Date/Time   TSH 3.790 11/28/2023 1119   Nutritional Lab Results  Component Value Date   VD25OH 42.4 11/28/2023  VD25OH 32.73 09/08/2022   VD25OH 58 12/06/2020    Medications: Outpatient Encounter Medications as of 04/08/2024  Medication Sig   acetaminophen  (TYLENOL ) 500 MG tablet Take 650 mg by mouth every 6 (six) hours as needed.   ALPRAZolam  (XANAX ) 0.25 MG tablet Take 1 tablet by mouth twice daily as needed for anxiety   Ascorbic Acid (VITAMIN C ) 500 MG CAPS Take 1 tab daily   baclofen  (LIORESAL ) 10 MG tablet TAKE 1/2 TO 1 (ONE-HALF TO ONE) TABLET BY MOUTH TWICE DAILY AS NEEDED FOR MUSCLE SPASM   cetirizine  (ZYRTEC  ALLERGY) 10 MG tablet Take 1 tablet (10 mg total) by mouth daily.   ibuprofen  (ADVIL ) 800 MG tablet TAKE 1 TABLET BY MOUTH EVERY 8 HOURS AS NEEDED   SYNTHROID  200 MCG tablet TAKE 1 TABLET BY MOUTH ONCE DAILY BEFORE BREAKFAST   triamterene -hydrochlorothiazide  (MAXZIDE -25) 37.5-25 MG tablet TAKE 1 TABLET ONE TIME DAILY FOR BLOOD PRESSURE   vitamin B-12 (CYANOCOBALAMIN ) 1000 MCG tablet Take 1,000 mcg by mouth daily.   Zinc  100 MG TABS Take 1 tab daily   [DISCONTINUED] tirzepatide  (ZEPBOUND ) 5 MG/0.5ML Pen Inject 5 mg into the skin once a week.   tirzepatide  (ZEPBOUND ) 5 MG/0.5ML Pen Inject 5 mg  into the skin once a week.   No facility-administered encounter medications on file as of 04/08/2024.     Follow-Up   Return in about 4 weeks (around 05/06/2024) for For Weight Mangement with Dr. Francyne.SABRA She was informed of the importance of frequent follow up visits to maximize her success with intensive lifestyle modifications for her multiple health conditions.  Attestation Statement   Reviewed by clinician on day of visit: allergies, medications, problem list, medical history, surgical history, family history, social history, and previous encounter notes.     Lucas Francyne, MD

## 2024-04-08 NOTE — Assessment & Plan Note (Signed)
 Significant weight loss of 13 pounds, now weighing 236 pounds. Improved appetite control and portion management. Initial constipation managed with dietary adjustments. Concerns about skin laxity due to weight loss, particularly in the neck area. Discussed potential for contouring surgery after reaching goal weight and maintaining it for six months. - Continue current medication at 5 mg dose for another month. - Maintain protein intake of 30 grams three times a day. - Encouraged hydration and adequate protein intake to prevent skin laxity. - Advised on strengthening exercises to maintain muscle tone. - Discussed potential for contouring surgery after reaching goal weight and maintaining it for six months.

## 2024-04-29 ENCOUNTER — Other Ambulatory Visit: Payer: Self-pay | Admitting: Family

## 2024-04-29 DIAGNOSIS — E039 Hypothyroidism, unspecified: Secondary | ICD-10-CM

## 2024-05-02 ENCOUNTER — Other Ambulatory Visit: Payer: Self-pay | Admitting: Nurse Practitioner

## 2024-05-02 DIAGNOSIS — E039 Hypothyroidism, unspecified: Secondary | ICD-10-CM

## 2024-05-02 MED ORDER — SYNTHROID 200 MCG PO TABS: ORAL_TABLET | ORAL | 1 refills | Status: AC

## 2024-05-02 NOTE — Telephone Encounter (Signed)
 Copied from CRM [id number]. Topic: Clinical - Medication Refill >> May 02, 2024 10:20 AM Nessti S wrote: Medication: SYNTHROID  200 MCG tablet  Has the patient contacted their pharmacy? Yes (Agent: If no, request that the patient contact the pharmacy for the refill. If patient does not wish to contact the pharmacy document the reason why and proceed with request.) (Agent: If yes, when and what did the pharmacy advise?)  This is the patient's preferred pharmacy:  [facility] [address] [place], [place] - [address] [place] [address] Phone: [phone] Fax: [phone]  Is this the correct pharmacy for this prescription? Yes If no, delete pharmacy and type the correct one.   Has the prescription been filled recently? No  Is the patient out of the medication? Yes  Has the patient been seen for an appointment in the last year OR does the patient have an upcoming appointment? Yes  Can we respond through MyChart? Yes  Agent: Please be advised that Rx refills may take up to 3 business days. We ask that you follow-up with your pharmacy.

## 2024-05-05 ENCOUNTER — Ambulatory Visit: Payer: Self-pay

## 2024-05-05 ENCOUNTER — Other Ambulatory Visit: Payer: Self-pay | Admitting: Sports Medicine

## 2024-05-05 NOTE — Telephone Encounter (Signed)
 FYI Only or Action Required?: FYI only for provider: appointment scheduled on 1/6 per pt preference.  Patient was last seen in primary care on 04/08/2024 by Francyne Romano, MD.  Called Nurse Triage reporting Cough.  Symptoms began a week ago.  Interventions attempted: OTC medications: OTC cold and cough meds and Rest, hydration, or home remedies.  Symptoms are: unchanged.  Triage Disposition: See HCP Within 4 Hours (Or PCP Triage)  Patient/caregiver understands and will follow disposition?: Yes    Copied from CRM [id number]. Topic: Clinical - Red Word Triage >> May 05, 2024  9:07 AM Berwyn MATSU wrote: Red Word that prompted transfer to Nurse Triage: flu like symptoms worse; patient is stating she needs tamiflu  and OTC is not really working still having cough/ congestion lots of body ache. Reason for Disposition  Wheezing is present  Answer Assessment - Initial Assessment Questions This RN recommended that pt be examined in next 4 hours for symptoms, no availability with offices today, refusing UC, scheduled earliest available with PCP office per pt preference, tomorrow afternoon. Advised call back or seek immediate care if new or worsening symptoms.    No SOB Since 12/30 Allergies anyway Lot of coughing, sneezing, congestion, body aches, like get to feeling better then set back again Already googled it, 2 strains of flu Aware of what's going on before go to doc office Requesting tamiflu prescription Trying to get appt just to get prescription To be honest with you, changing doc offices can't even get into my own doc office No chest pain Coughing up a lot of phlegm, kind of clear this morning with yellowish like brownish like then clear this morning OTC meds What concerns me is that keep getting bad again Was feeling weak No appetite Not taken flu/covid test, insurance doesn't cover it Heard a little whistling last night in her breathing, not a rattling noise, not to the  extreme but did hear that Had fever but no fever since, didn't take temp, no chills  Protocols used: Cough - Acute Productive-A-AH

## 2024-05-06 ENCOUNTER — Telehealth (INDEPENDENT_AMBULATORY_CARE_PROVIDER_SITE_OTHER): Payer: Self-pay

## 2024-05-06 ENCOUNTER — Other Ambulatory Visit: Payer: Self-pay | Admitting: Nurse Practitioner

## 2024-05-06 ENCOUNTER — Ambulatory Visit: Admitting: Internal Medicine

## 2024-05-06 DIAGNOSIS — Z1231 Encounter for screening mammogram for malignant neoplasm of breast: Secondary | ICD-10-CM

## 2024-05-06 NOTE — Telephone Encounter (Signed)
 Kathleen Mcfarland (Key: [id number]) Zepbound  5MG /0.5ML pen-injectors

## 2024-05-07 ENCOUNTER — Encounter (INDEPENDENT_AMBULATORY_CARE_PROVIDER_SITE_OTHER): Payer: Self-pay | Admitting: Internal Medicine

## 2024-05-07 ENCOUNTER — Ambulatory Visit (INDEPENDENT_AMBULATORY_CARE_PROVIDER_SITE_OTHER): Admitting: Internal Medicine

## 2024-05-07 ENCOUNTER — Telehealth: Payer: Self-pay | Admitting: Nurse Practitioner

## 2024-05-07 ENCOUNTER — Other Ambulatory Visit: Payer: Self-pay | Admitting: Nurse Practitioner

## 2024-05-07 VITALS — BP 140/84 | HR 88 | Temp 98.2°F | Ht 67.0 in | Wt 228.0 lb

## 2024-05-07 DIAGNOSIS — G4733 Obstructive sleep apnea (adult) (pediatric): Secondary | ICD-10-CM | POA: Diagnosis not present

## 2024-05-07 DIAGNOSIS — Z6837 Body mass index (BMI) 37.0-37.9, adult: Secondary | ICD-10-CM | POA: Diagnosis not present

## 2024-05-07 DIAGNOSIS — R7303 Prediabetes: Secondary | ICD-10-CM

## 2024-05-07 DIAGNOSIS — E66812 Obesity, class 2: Secondary | ICD-10-CM

## 2024-05-07 NOTE — Telephone Encounter (Signed)
 Copied from CRM [id number]. Topic: Clinical - Medication Refill >> May 07, 2024  2:13 PM Deleta RAMAN wrote: Medication: tirzepatide  (ZEPBOUND ) 5 MG/0.5ML Pen  Has the patient contacted their pharmacy? Yes (Agent: If no, request that the patient contact the pharmacy for the refill. If patient does not wish to contact the pharmacy document the reason why and proceed with request.) (Agent: If yes, when and what did the pharmacy advise?)  This is the patient's preferred pharmacy:  [facility] [address] [place], [place] - [address] [place] [address] Phone: [phone] Fax: [phone]  Is this the correct pharmacy for this prescription? Yes If no, delete pharmacy and type the correct one.   Has the prescription been filled recently? Yes  Is the patient out of the medication? Yes  Has the patient been seen for an appointment in the last year OR does the patient have an upcoming appointment? Yes  Can we respond through MyChart? Yes  Agent: Please be advised that Rx refills may take up to 3 business days. We ask that you follow-up with your pharmacy.

## 2024-05-07 NOTE — Telephone Encounter (Signed)
 Copied from CRM [id number]. Topic: Clinical - Medication Question >> May 07, 2024  4:28 PM Hadassah PARAS wrote:  Reason for CRM: Pt would like Francyne Romano, MD nurse to give her a call ack as to why her medication for Zepbound  has not been sent to pharm. Please advise pt on #[phone]

## 2024-05-07 NOTE — Progress Notes (Signed)
 "  Lucas Parker, MD ABIM, ABOM  [address] [place], [place], [place] [address] Office: [phone]  /  Fax: [phone]    Weight Summary and Body Composition Analysis (BIA)  Vitals Temp: 98.2 F (36.8 C) BP: (!) 140/84 Pulse Rate: 88 SpO2: 100 %   Anthropometric Measurements Height: 5' 7 (1.702 m) Weight: 228 lb (103.4 kg) BMI (Calculated): 35.7 Weight at Last Visit: 236 lb Weight Lost Since Last Visit: 8 lb Weight Gained Since Last Visit: 0 lb Starting Weight: 249 lb Total Weight Loss (lbs): 21 lb (9.526 kg) Peak Weight: 257 lb   Body Composition  Body Fat %: 46 % Fat Mass (lbs): 105.2 lbs Muscle Mass (lbs): 117.2 lbs Total Body Water (lbs): 77.6 lbs Visceral Fat Rating : 14    RMR: 1843  Today's Visit #: 8  Starting Date: 11/28/23   Subjective   Chief Complaint: Obesity  Interval History  Discussed the use of AI scribe software for clinical note transcription with the patient, who gave verbal consent to proceed.  History of Present Illness Kathleen Mcfarland is a 64 year old female with hypertension, prediabetes, and obstructive sleep apnea who presents for medical weight management.  She has been adhering to a 1300 calorie low carb meal plan with 90% compliance, resulting in an eight-pound weight loss since her last visit. She exercises three days a week for 15 minutes, focusing on cardio and strengthening exercises. She is currently on anti-obesity medication, taking 5 mg once a week.  Her appetite has increased, and she does not feel satiated after meals, often continuing to eat. She experiences cravings, particularly for chocolate, and has developed a preference for crushed ice since starting the medication. Despite these challenges, she has lost 23 pounds in the last five months.  She incorporates protein shakes into her diet, often substituting them for a full breakfast or post-workout meal. She has also increased her intake of salads and  reduced her consumption of heavy meals and large amounts of meat.  She expressed concerns about the flu pandemic affecting her gym attendance, as she avoids the gym when it is crowded or when the water temperature is not right. She sometimes uses YouTube workout videos at home as an alternative.  During the holidays, she chose not to indulge in sweets and left leftover food at her son's house after a family event.     [] Denies [x] Reports []  Improved problems with appetite and hunger signals.  [] Denies [x] Reports []  Improved problems with satiety and satiation.  [x] Denies [] Reports []  Improved problems with eating patterns and portion control.  [] Denies [x] Reports []  Improved abnormal cravings   Challenges affecting patient progress: None   Pharmacotherapy for weight management: Zepbound  5 mg once a week  Assessment and Plan   Treatment Plan For Obesity:  Recommended Dietary Goals  Kayton is currently in the action stage of change. As such, her goal is to continue weight management plan. She has agreed to: keep a food journal with a target of  1300 calories per day and 90-120 grams of protein per day or 30-40 grams per meal.  Behavioral Health and Counseling  We discussed the following behavioral modification strategies today: continue to work on maintaining a reduced calorie state, getting the recommended amount of protein, incorporating whole foods, making healthy choices, staying well hydrated and practicing mindfulness when eating. and increase protein intake, fibrous foods (25 grams per day for women, 30 grams for men) and water to improve satiety and decrease hunger  signals. .  Additional education and resources provided today: None  Recommended Physical Activity Goals  [place] has been advised to work up to 240 minutes of combined endurance and strengthening exercises per week for cardiovascular health, weight loss maintenance and preservation of muscle mass.  She has  agreed to :  Increase volume of physical activity to a goal of 240 minutes a week and Combine aerobic and strengthening exercises for efficiency and improved cardiometabolic health.  Medical Interventions and Pharmacotherapy  We have reviewed current or available treatment options to assist her with their weight loss efforts and we agreed to : Adequate clinical response to anti-obesity medication, continue current anti-obesity regimen, do not recommend further increases in GLP-1 due to adequate clinical response , Nutritional guidance was provided to help minimize the risk of exacerbating GLP-1 related side effects, and Patient was counseled on the importance of maintaining healthy lifestyle habits, including balanced nutrition, regular physical activity, and behavioral modifications, while taking antiobesity medication.  Patient verbalized understanding that medication is an adjunct to, not a replacement for, lifestyle changes and that the long-term success and weight maintenance depend on continued adherence to these strategies.  Associated Conditions Impacted by Obesity Treatment  Assessment & Plan Class 2 severe obesity with serious comorbidity and body mass index (BMI) of 37.0 to 37.9 in adult, unspecified obesity type Significant weight loss of 23 pounds over five months. Currently on a 1300 calorie low carb meal plan, adhered to 90% of the time, and exercising three days a week with cardio and strengthening. On step-down 5 mg once a week for anti-obesity treatment. Reports increased appetite and cravings for chocolate and ice, but overall weight loss is rapid and effective. No muscle loss noted, indicating good protein intake and physical activity. Medication aids in reducing food noise and hunger, but lifestyle changes are emphasized as the primary weight management strategy. Insurance coverage for medication is contingent on qualifying medical conditions, including obstructive sleep apnea. -  Continue Zepbound  5 mg once a week. - Maintain current meal plan and exercise regimen. - Focus on increasing meal volume with protein, fiber, and water to manage hunger. - Encouraged variety in salads with added proteins and vegetables. - Scheduled follow-up appointment in four weeks. OSA (obstructive sleep apnea) I reviewed sleep study from February 2023 done at [facility] sleep [facility] that showed moderate sleep apnea with an AHI of 20.8 with severe desaturations of 74%.  She did not tolerate CPAP therapy and therefore remains symptomatic and condition remains untreated.  She has been educated on conditions associated with sleep apnea in the bidirectional relationship with weight.  Continue with nutritional behavioral strategies and weight loss aided by GLP-1.  Zepbound  will be increased to 5 mg once a week. Pre-diabetes Continue to maintain a diet low in simple and added sugars reduced in calories.  Continue Zepbound  for pharmacoprevention.        No orders of the defined types were placed in this encounter.    Objective   Physical Exam:  Blood pressure (!) 140/84, pulse 88, temperature 98.2 F (36.8 C), height 5' 7 (1.702 m), weight 228 lb (103.4 kg), SpO2 100%. Body mass index is 35.71 kg/m.  General: She is overweight, cooperative, alert, well developed, and in no acute distress. PSYCH: Has normal mood, affect and thought process.   HEENT: EOMI, sclerae are anicteric. Lungs: Normal breathing effort, no conversational dyspnea. Extremities: No edema.  Neurologic: No gross sensory or motor deficits. No tremors or fasciculations noted.  Diagnostic Data Reviewed:  BMET    Component Value Date/Time   NA 138 11/28/2023 1119   K 4.1 11/28/2023 1119   CL 103 11/28/2023 1119   CO2 22 11/28/2023 1119   GLUCOSE 103 (H) 11/28/2023 1119   GLUCOSE 120 (H) 06/13/2023 1216   GLUCOSE 121 (H) 04/26/2006 1348   BUN 9 11/28/2023 1119   CREATININE 0.71 11/28/2023 1119    CREATININE 0.57 12/06/2020 1415   CALCIUM 8.8 11/28/2023 1119   GFRNONAA >60 07/15/2021 1903   GFRNONAA 77 11/30/2014 1144   GFRAA 111 12/22/2019 0913   GFRAA 89 11/30/2014 1144   Lab Results  Component Value Date   HGBA1C 6.2 (H) 11/28/2023   HGBA1C 6.0 (H) 01/30/2014   Lab Results  Component Value Date   INSULIN  21.2 11/28/2023   Lab Results  Component Value Date   TSH 3.790 11/28/2023   CBC    Component Value Date/Time   WBC 4.3 11/28/2023 1119   WBC 4.8 06/13/2023 1216   RBC 5.03 11/28/2023 1119   RBC 5.23 (H) 06/13/2023 1216   HGB 13.7 11/28/2023 1119   HCT 43.2 11/28/2023 1119   PLT 218 11/28/2023 1119   MCV 86 11/28/2023 1119   MCH 27.2 11/28/2023 1119   MCH 27.9 07/15/2021 1903   MCHC 31.7 11/28/2023 1119   MCHC 32.9 06/13/2023 1216   RDW 12.5 11/28/2023 1119   Iron Studies    Component Value Date/Time   IRON 90 06/13/2023 1216   FERRITIN 12.6 06/13/2023 1216   Lipid Panel     Component Value Date/Time   CHOL 160 11/28/2023 1119   TRIG 146 11/28/2023 1119   HDL 31 (L) 11/28/2023 1119   CHOLHDL 5 06/13/2023 1216   VLDL 30.8 06/13/2023 1216   LDLCALC 103 (H) 11/28/2023 1119   LDLCALC 107 (H) 12/06/2020 1415   Hepatic Function Panel     Component Value Date/Time   PROT 5.5 (L) 11/28/2023 1119   ALBUMIN 3.5 (L) 11/28/2023 1119   AST 23 11/28/2023 1119   ALT 19 11/28/2023 1119   ALKPHOS 57 11/28/2023 1119   BILITOT 0.3 11/28/2023 1119   BILIDIR 0.1 01/03/2011 0844      Component Value Date/Time   TSH 3.790 11/28/2023 1119   Nutritional Lab Results  Component Value Date   VD25OH 42.4 11/28/2023   VD25OH 32.73 09/08/2022   VD25OH 58 12/06/2020    Medications: Outpatient Encounter Medications as of 05/07/2024  Medication Sig   acetaminophen  (TYLENOL ) 500 MG tablet Take 650 mg by mouth every 6 (six) hours as needed.   ALPRAZolam  (XANAX ) 0.25 MG tablet Take 1 tablet by mouth twice daily as needed for anxiety   Ascorbic Acid (VITAMIN C ) 500  MG CAPS Take 1 tab daily   baclofen  (LIORESAL ) 10 MG tablet TAKE 1/2 TO 1 (ONE-HALF TO ONE) TABLET BY MOUTH TWICE DAILY AS NEEDED FOR MUSCLE SPASM   cetirizine  (ZYRTEC  ALLERGY) 10 MG tablet Take 1 tablet (10 mg total) by mouth daily.   ibuprofen  (ADVIL ) 800 MG tablet TAKE 1 TABLET BY MOUTH EVERY 8 HOURS AS NEEDED   SYNTHROID  200 MCG tablet TAKE 1 TABLET BY MOUTH ONCE DAILY BEFORE BREAKFAST   tirzepatide  (ZEPBOUND ) 5 MG/0.5ML Pen Inject 5 mg into the skin once a week.   triamterene -hydrochlorothiazide  (MAXZIDE -25) 37.5-25 MG tablet Take 1 tablet by mouth once daily for blood pressure   vitamin B-12 (CYANOCOBALAMIN ) 1000 MCG tablet Take 1,000 mcg by mouth daily.   Zinc  100 MG TABS  Take 1 tab daily   No facility-administered encounter medications on file as of 05/07/2024.     Follow-Up   Return in about 4 weeks (around 06/04/2024) for For Weight Mangement with Dr. Francyne.SABRA She was informed of the importance of frequent follow up visits to maximize her success with intensive lifestyle modifications for her multiple health conditions.  Attestation Statement   Reviewed by clinician on day of visit: allergies, medications, problem list, medical history, surgical history, family history, social history, and previous encounter notes.     Lucas Francyne, MD ABIM, ABOM "

## 2024-05-08 ENCOUNTER — Encounter: Payer: Self-pay | Admitting: Internal Medicine

## 2024-05-08 ENCOUNTER — Ambulatory Visit: Admitting: Internal Medicine

## 2024-05-08 ENCOUNTER — Encounter (INDEPENDENT_AMBULATORY_CARE_PROVIDER_SITE_OTHER): Payer: Self-pay

## 2024-05-08 VITALS — BP 132/80 | HR 80 | Temp 99.1°F | Ht 67.0 in | Wt 231.0 lb

## 2024-05-08 DIAGNOSIS — R7303 Prediabetes: Secondary | ICD-10-CM | POA: Diagnosis not present

## 2024-05-08 DIAGNOSIS — I10 Essential (primary) hypertension: Secondary | ICD-10-CM

## 2024-05-08 DIAGNOSIS — R058 Other specified cough: Secondary | ICD-10-CM | POA: Insufficient documentation

## 2024-05-08 MED ORDER — TIRZEPATIDE-WEIGHT MANAGEMENT 5 MG/0.5ML ~~LOC~~ SOAJ: 5.0000 mg | SUBCUTANEOUS | 0 refills | Status: DC

## 2024-05-08 MED ORDER — HYDROCODONE BIT-HOMATROP MBR 5-1.5 MG/5ML PO SOLN: 5.0000 mL | Freq: Four times a day (QID) | ORAL | 0 refills | Status: AC | PRN

## 2024-05-08 MED ORDER — AZITHROMYCIN 250 MG PO TABS: ORAL_TABLET | ORAL | 1 refills | Status: AC

## 2024-05-08 NOTE — Assessment & Plan Note (Signed)
 Continue to maintain a diet low in simple and added sugars reduced in calories.  Continue Zepbound  for pharmacoprevention.

## 2024-05-08 NOTE — Patient Instructions (Signed)
Please take all new medication as prescribed - the antibiotic, and cough medicine  Please continue all other medications as before, and refills have been done if requested.  Please have the pharmacy call with any other refills you may need.  Please keep your appointments with your specialists as you may have planned      

## 2024-05-08 NOTE — Assessment & Plan Note (Signed)
 BP Readings from Last 3 Encounters:  05/08/24 132/80  05/07/24 (!) 140/84  04/08/24 (!) 140/75   Stable, pt to continue medical treatment maxide 25 qd

## 2024-05-08 NOTE — Telephone Encounter (Signed)
 PA for Zepbound  approved, pt notified

## 2024-05-08 NOTE — Assessment & Plan Note (Signed)
 Lab Results  Component Value Date   HGBA1C 6.2 (H) 11/28/2023   Stable, pt to continue current medical treatment  - diet, wt control

## 2024-05-08 NOTE — Progress Notes (Signed)
 Patient ID: Kathleen Mcfarland, female   DOB: Mar 12, 1961, 64 y.o.   MRN: [id number]        Chief Complaint: follow up productive cough, preDM, htn       HPI:  Kathleen Mcfarland is a 64 y.o. female Here with acute onset mild to mod since dec 30 with ST, HA, general weakness and malaise, with prod cough greenish sputum, but Pt denies chest pain, increased sob or doe, wheezing, orthopnea, PND, increased LE swelling, palpitations, dizziness or syncope.   Pt denies polydipsia, polyuria, or new focal neuro s/s.    Pt denies fever, wt loss, night sweats, loss of appetite, or other constitutional symptoms         Wt Readings from Last 3 Encounters:  05/08/24 231 lb (104.8 kg)  05/07/24 228 lb (103.4 kg)  04/08/24 236 lb (107 kg)   BP Readings from Last 3 Encounters:  05/08/24 132/80  05/07/24 (!) 140/84  04/08/24 (!) 140/75         Past Medical History:  Diagnosis Date   Allergic rhinitis    Anxiety    B12 deficiency    Back pain    Back pain    Cataract    Complication of anesthesia    hard to wake up   Depression    Diabetes (HCC)    Edema of both lower extremities    Glaucoma    Hypertension    Hypothyroidism    Joint pain    Lactose intolerance    Osteoarthritis 2012   bilateral knees   Osteoarthritis of knee    Osteoporosis    Vitamin D  deficiency    Past Surgical History:  Procedure Laterality Date   COLONOSCOPY  09/05/2016   Dr.Jacobs   DILATION AND CURETTAGE OF UTERUS     ROTATOR CUFF REPAIR     SHOULDER ARTHROSCOPY  04/13/2011   Procedure: ARTHROSCOPY SHOULDER;  Surgeon: Toribio JULIANNA Chancy, MD;  Location: [facility];  Service: Orthopedics;  Laterality: Right;  Right Shoulder Arthroscopy with Acrmioploasty, Arhtroscopic rotator cuff repair and Microfracture humeral head   SHOULDER ARTHROSCOPY WITH ROTATOR CUFF REPAIR Right 07/04/2012   Procedure: SHOULDER ARTHROSCOPY WITH ROTATOR CUFF REPAIR;  Surgeon: Toribio JULIANNA Chancy, MD;  Location: [facility];  Service: Orthopedics;  Laterality: Right;  RIGHT SHOULDER ARTHROSCOPY WITH ARTHROSCOPIC ROTATOR CUFF REPAIR, LYSIS OF ADHESIONS   TUBAL LIGATION      reports that she has never smoked. She has never used smokeless tobacco. She reports current alcohol use. She reports that she does not use drugs. family history includes Anxiety disorder in her mother; Arthritis in her sister; Bipolar disorder in her mother; Cancer in her mother and paternal grandmother; Cervical cancer in her sister; Cirrhosis in her maternal grandfather; Colon cancer in her father, maternal grandmother, and another family member; Colon polyps in her brother, sister, sister, and another family member; Coronary artery disease in her paternal grandmother; Coronary artery disease (age of onset: 13) in her father; Depression in her mother; Diabetes in her father, mother, sister, and sister; Dislocations in her maternal grandmother; Heart disease in her father and paternal grandmother; Hyperlipidemia in her father; Hypertension in her father, mother, sister, and sister; Liver disease in her mother; Other in her sister; Ovarian cancer in her maternal grandmother. Allergies[1] Medications Ordered Prior to Encounter[2]      ROS:  All others reviewed and negative.  Objective        PE:  BP 132/80 (  BP Location: Right Arm, Patient Position: Sitting, Cuff Size: Normal)   Pulse 80   Temp 99.1 F (37.3 C) (Oral)   Ht 5' 7 (1.702 m)   Wt 231 lb (104.8 kg)   SpO2 100%   BMI 36.18 kg/m                 Constitutional: Pt appears mild ill               HENT: Head: NCAT.                Right Ear: External ear normal.                 Left Ear: External ear normal. Bilat tm's with mild erythema.  Max sinus areas non tender.  Pharynx with mild erythema, no exudate               Eyes: . Pupils are equal, round, and reactive to light. Conjunctivae and EOM are normal               Nose: without d/c or deformity               Neck:  Neck supple. Gross normal ROM               Cardiovascular: Normal rate and regular rhythm.                 Pulmonary/Chest: Effort normal and breath sounds decreased without rales or wheezing.                              Neurological: Pt is alert. At baseline orientation, motor grossly intact               Skin: Skin is warm. No rashes, no other new lesions, LE edema - none               Psychiatric: Pt behavior is normal without agitation   Micro: none  Cardiac tracings I have personally interpreted today:  none  Pertinent Radiological findings (summarize): none   Lab Results  Component Value Date   WBC 4.3 11/28/2023   HGB 13.7 11/28/2023   HCT 43.2 11/28/2023   PLT 218 11/28/2023   GLUCOSE 103 (H) 11/28/2023   CHOL 160 11/28/2023   TRIG 146 11/28/2023   HDL 31 (L) 11/28/2023   LDLCALC 103 (H) 11/28/2023   ALT 19 11/28/2023   AST 23 11/28/2023   NA 138 11/28/2023   K 4.1 11/28/2023   CL 103 11/28/2023   CREATININE 0.71 11/28/2023   BUN 9 11/28/2023   CO2 22 11/28/2023   TSH 3.790 11/28/2023   HGBA1C 6.2 (H) 11/28/2023   Assessment/Plan:  Kathleen Mcfarland is a 64 y.o. Black or African American [2] female with  has a past medical history of Allergic rhinitis, Anxiety, B12 deficiency, Back pain, Back pain, Cataract, Complication of anesthesia, Depression, Diabetes (HCC), Edema of both lower extremities, Glaucoma, Hypertension, Hypothyroidism, Joint pain, Lactose intolerance, Osteoarthritis (2012), Osteoarthritis of knee, Osteoporosis, and Vitamin D  deficiency.  Pre-diabetes Lab Results  Component Value Date   HGBA1C 6.2 (H) 11/28/2023   Stable, pt to continue current medical treatment  - diet, wt control   Hypertensive disorder BP Readings from Last 3 Encounters:  05/08/24 132/80  05/07/24 (!) 140/84  04/08/24 (!) 140/75   Stable, pt to continue medical treatment maxide 25 qd   Productive cough  Mild to mod, c/w bronchitis vs pna, decines cxr, for antibx  course zpack and cough med prn,  to f/u any worsening symptoms or concerns  Followup: Return if symptoms worsen or fail to improve.  Lynwood Rush, MD 05/08/2024 6:15 PM [facility]     [1]  Allergies Allergen Reactions   Penicillins Hives and Other (See Comments)   Felodipine Nausea And Vomiting   Metformin  And Related     GI upset  [2]  Current Outpatient Medications on File Prior to Visit  Medication Sig Dispense Refill   acetaminophen  (TYLENOL ) 500 MG tablet Take 650 mg by mouth every 6 (six) hours as needed.     ALPRAZolam  (XANAX ) 0.25 MG tablet Take 1 tablet by mouth twice daily as needed for anxiety 30 tablet 0   Ascorbic Acid (VITAMIN C ) 500 MG CAPS Take 1 tab daily     baclofen  (LIORESAL ) 10 MG tablet TAKE 1/2 TO 1 (ONE-HALF TO ONE) TABLET BY MOUTH TWICE DAILY AS NEEDED FOR MUSCLE SPASM 60 tablet 0   cetirizine  (ZYRTEC  ALLERGY) 10 MG tablet Take 1 tablet (10 mg total) by mouth daily. 90 tablet 3   ibuprofen  (ADVIL ) 800 MG tablet TAKE 1 TABLET BY MOUTH EVERY 8 HOURS AS NEEDED 90 tablet 0   SYNTHROID  200 MCG tablet TAKE 1 TABLET BY MOUTH ONCE DAILY BEFORE BREAKFAST 90 tablet 1   tirzepatide  (ZEPBOUND ) 5 MG/0.5ML Pen Inject 5 mg into the skin once a week. 2 mL 0   triamterene -hydrochlorothiazide  (MAXZIDE -25) 37.5-25 MG tablet Take 1 tablet by mouth once daily for blood pressure 90 tablet 0   vitamin B-12 (CYANOCOBALAMIN ) 1000 MCG tablet Take 1,000 mcg by mouth daily.     Zinc  100 MG TABS Take 1 tab daily  0   No current facility-administered medications on file prior to visit.

## 2024-05-08 NOTE — Assessment & Plan Note (Signed)
 Significant weight loss of 23 pounds over five months. Currently on a 1300 calorie low carb meal plan, adhered to 90% of the time, and exercising three days a week with cardio and strengthening. On step-down 5 mg once a week for anti-obesity treatment. Reports increased appetite and cravings for chocolate and ice, but overall weight loss is rapid and effective. No muscle loss noted, indicating good protein intake and physical activity. Medication aids in reducing food noise and hunger, but lifestyle changes are emphasized as the primary weight management strategy. Insurance coverage for medication is contingent on qualifying medical conditions, including obstructive sleep apnea. - Continue Zepbound  5 mg once a week. - Maintain current meal plan and exercise regimen. - Focus on increasing meal volume with protein, fiber, and water to manage hunger. - Encouraged variety in salads with added proteins and vegetables. - Scheduled follow-up appointment in four weeks.

## 2024-05-08 NOTE — Assessment & Plan Note (Signed)
 I reviewed sleep study from February 2023 done at [facility] that showed moderate sleep apnea with an AHI of 20.8 with severe desaturations of 74%.  She did not tolerate CPAP therapy and therefore remains symptomatic and condition remains untreated.  She has been educated on conditions associated with sleep apnea in the bidirectional relationship with weight.  Continue with nutritional behavioral strategies and weight loss aided by GLP-1.  Zepbound  will be increased to 5 mg once a week.

## 2024-05-08 NOTE — Assessment & Plan Note (Signed)
 Mild to mod, c/w bronchitis vs pna, decines cxr, for antibx course zpack and cough med prn,  to f/u any worsening symptoms or concerns

## 2024-05-18 ENCOUNTER — Other Ambulatory Visit: Payer: Self-pay | Admitting: Family

## 2024-05-18 DIAGNOSIS — F419 Anxiety disorder, unspecified: Secondary | ICD-10-CM

## 2024-05-26 ENCOUNTER — Ambulatory Visit: Admitting: Obstetrics and Gynecology

## 2024-06-03 ENCOUNTER — Ambulatory Visit: Admitting: Dermatology

## 2024-06-04 ENCOUNTER — Encounter (INDEPENDENT_AMBULATORY_CARE_PROVIDER_SITE_OTHER): Payer: Self-pay | Admitting: Internal Medicine

## 2024-06-04 ENCOUNTER — Ambulatory Visit (INDEPENDENT_AMBULATORY_CARE_PROVIDER_SITE_OTHER): Admitting: Internal Medicine

## 2024-06-04 VITALS — BP 128/82 | HR 86 | Temp 99.1°F | Ht 67.0 in | Wt 228.0 lb

## 2024-06-04 DIAGNOSIS — R7303 Prediabetes: Secondary | ICD-10-CM

## 2024-06-04 DIAGNOSIS — E66812 Obesity, class 2: Secondary | ICD-10-CM | POA: Diagnosis not present

## 2024-06-04 DIAGNOSIS — G4733 Obstructive sleep apnea (adult) (pediatric): Secondary | ICD-10-CM

## 2024-06-04 DIAGNOSIS — Z6837 Body mass index (BMI) 37.0-37.9, adult: Secondary | ICD-10-CM | POA: Diagnosis not present

## 2024-06-04 MED ORDER — TIRZEPATIDE-WEIGHT MANAGEMENT 5 MG/0.5ML ~~LOC~~ SOAJ: 5.0000 mg | SUBCUTANEOUS | 0 refills | Status: AC

## 2024-06-04 NOTE — Progress Notes (Signed)
 "  Office: [phone]  /  Fax: [phone]  Weight Summary and Body Composition Analysis (BIA)  Vitals Temp: 99.1 F (37.3 C) BP: 128/82 Pulse Rate: 86 SpO2: 99 %   Anthropometric Measurements Height: 5' 7 (1.702 m) Weight: 228 lb (103.4 kg) BMI (Calculated): 35.7 Weight at Last Visit: 228 lb Weight Lost Since Last Visit: 0 lb Weight Gained Since Last Visit: 0 lb Starting Weight: 249 lb Total Weight Loss (lbs): 21 lb (9.526 kg) Peak Weight: 257 lb   Body Composition  Body Fat %: 47.4 % Fat Mass (lbs): 108 lbs Muscle Mass (lbs): 114 lbs Total Body Water (lbs): 79.2 lbs Visceral Fat Rating : 14    RMR: 1843  Today's Visit #: 9  Starting Date: 11/28/23   Subjective   Chief Complaint: Obesity  Interval History  Discussed the use of AI scribe software for clinical note transcription with the patient, who gave verbal consent to proceed.  History of Present Illness Kathleen Mcfarland is a 64 year old female who presents for medical weight management.  Kathleen Mcfarland has been adhering to a 1300 calorie nutrition plan with 95% compliance and engages in cardio strengthening exercises twice a week for 60 minutes. Since starting Zepbound  5 mg in September 2025, Kathleen Mcfarland has experienced adequate appetite control and a sense of fullness. Kathleen Mcfarland uses protein shakes as meal replacements, especially during a recent cold that affected her ability to eat regular meals.  Over the past four weeks, Kathleen Mcfarland experienced a severe cold, initially suspected to be the flu, which impacted her physical activity and nutrition. Kathleen Mcfarland was unable to maintain her usual exercise routine, missing her third weekly workout. Her diet shifted to include more carbohydrates, such as crackers with soup, and Kathleen Mcfarland struggled to eat three meals a day. Despite these changes, Kathleen Mcfarland maintained her weight.  Kathleen Mcfarland is considering returning to water aerobics, which Kathleen Mcfarland previously found beneficial for weight loss.  Kathleen Mcfarland is mindful of her  eating habits, avoiding late meals and trying to maintain a balanced diet. Her meals include green leafy vegetables and protein, such as egg whites with spinach, green peppers, and onions, and oatmeal with flaxseed and blueberries. Kathleen Mcfarland is cautious about consuming bananas due to their high sugar content.     Challenges affecting patient progress: medical comorbidities, slow metabolism for age, and menopause.    Pharmacotherapy for weight management: Kathleen Mcfarland is currently taking Monjauro with diabetes as the primary indication and obesity secondary with adequate clinical response  and without side effects..   Assessment and Plan   Treatment Plan For Obesity:  Recommended Dietary Goals  Kathleen Mcfarland is currently in the action stage of change. As such, her goal is to continue weight management plan. Kathleen Mcfarland has agreed to: continue current reduced-calorie meal plan  Behavioral Health and Counseling  We discussed the following behavioral modification strategies today: continue to work on maintaining a reduced calorie state, getting the recommended amount of protein, incorporating whole foods, making healthy choices, staying well hydrated and practicing mindfulness when eating. and increase protein intake, fibrous foods (25 grams per day for women, 30 grams for men) and water to improve satiety and decrease hunger signals. .  Additional education and resources provided today: None  Recommended Physical Activity Goals  Kathleen Mcfarland has been advised to work up to 150 minutes of moderate intensity aerobic activity a week and strengthening exercises 2-3 times per week for cardiovascular health, weight loss maintenance and preservation of muscle mass.  Kathleen Mcfarland has agreed to :  Increase  volume of physical activity to a goal of 240 minutes a week and Combine aerobic and strengthening exercises for efficiency and improved cardiometabolic health.  Medical Interventions and Pharmacotherapy  We discussed various medication  options to help Kathleen Mcfarland with her weight loss efforts and we both agreed to : Adequate clinical response to anti-obesity medication, continue current anti-obesity regimen and do not recommend further increases in GLP-1 due to adequate clinical response   Associated Conditions Impacted by Obesity Treatment  Assessment & Plan OSA (obstructive sleep apnea) -Moderate OSA ( HT 2/23 AHI-21/hr; O2 in-74%- 4 min O2 less than 89%)-OAT + weight loss Stable on current therapy. Weight reduction is anticipated to improve OSA severity. Continue current management; reassess symptoms as weight loss progresses.  Class 2 severe obesity with serious comorbidity and body mass index (BMI) of 37.0 to 37.9 in adult, unspecified obesity type Kathleen Mcfarland has been on a 1300 calorie nutrition plan and exercising twice a week for cardio strengthening. Kathleen Mcfarland has been on Tirzepatide  (Zepbound ) 5 mg since September, which has helped with appetite control and a sense of fullness. Recent illness with a cold and allergies affected her physical activity and dietary habits, leading to a temporary maintenance of weight rather than a plateau. Kathleen Mcfarland is not experiencing a plateau as defined by several months of no weight loss. Kathleen Mcfarland is considering returning to water aerobics, which previously aided in weight loss. Kathleen Mcfarland is aware of the importance of exercise in maintaining weight loss and is committed to incorporating it into her lifestyle. - Continue Tirzepatide  (Zepbound ) 5 mg subcutaneous once a week. - Encouraged continuation of 1300 calorie nutrition plan. - Encouraged resumption of water aerobics and maintaining regular exercise routine. - Advised on maintaining consistent meal frequency and dietary habits. Pre-diabetes Lab Results  Component Value Date   HGBA1C 6.2 (H) 11/28/2023   HGBA1C 6.3 06/13/2023   HGBA1C 6.3 09/08/2022   Stable at this time. Glycemic control is being addressed through the weight management plan above, with expected  improvement in insulin  resistance and metabolic parameters as weight loss progresses. Will continue to monitor A1c and glucose trends. Continue monjauro current dose, no increase, due to history of restrictive eating.            Objective   Physical Exam:  Blood pressure 128/82, pulse 86, temperature 99.1 F (37.3 C), height 5' 7 (1.702 m), weight 228 lb (103.4 kg), SpO2 99%. Body mass index is 35.71 kg/m.  General: Kathleen Mcfarland is overweight, cooperative, alert, well developed, and in no acute distress. PSYCH: Has normal mood, affect and thought process.   HEENT: EOMI, sclerae are anicteric. Lungs: Normal breathing effort, no conversational dyspnea. Extremities: No edema.  Neurologic: No gross sensory or motor deficits. No tremors or fasciculations noted.    Diagnostic Data Reviewed:  BMET    Component Value Date/Time   NA 138 11/28/2023 1119   K 4.1 11/28/2023 1119   CL 103 11/28/2023 1119   CO2 22 11/28/2023 1119   GLUCOSE 103 (H) 11/28/2023 1119   GLUCOSE 120 (H) 06/13/2023 1216   GLUCOSE 121 (H) 04/26/2006 1348   BUN 9 11/28/2023 1119   CREATININE 0.71 11/28/2023 1119   CREATININE 0.57 12/06/2020 1415   CALCIUM 8.8 11/28/2023 1119   GFRNONAA >60 07/15/2021 1903   GFRNONAA 77 11/30/2014 1144   GFRAA 111 12/22/2019 0913   GFRAA 89 11/30/2014 1144   Lab Results  Component Value Date   HGBA1C 6.2 (H) 11/28/2023   HGBA1C 6.0 (H) 01/30/2014  Lab Results  Component Value Date   INSULIN  21.2 11/28/2023   Lab Results  Component Value Date   TSH 3.790 11/28/2023   CBC    Component Value Date/Time   WBC 4.3 11/28/2023 1119   WBC 4.8 06/13/2023 1216   RBC 5.03 11/28/2023 1119   RBC 5.23 (H) 06/13/2023 1216   HGB 13.7 11/28/2023 1119   HCT 43.2 11/28/2023 1119   PLT 218 11/28/2023 1119   MCV 86 11/28/2023 1119   MCH 27.2 11/28/2023 1119   MCH 27.9 07/15/2021 1903   MCHC 31.7 11/28/2023 1119   MCHC 32.9 06/13/2023 1216   RDW 12.5 11/28/2023 1119   Iron  Studies    Component Value Date/Time   IRON 90 06/13/2023 1216   FERRITIN 12.6 06/13/2023 1216   Lipid Panel     Component Value Date/Time   CHOL 160 11/28/2023 1119   TRIG 146 11/28/2023 1119   HDL 31 (L) 11/28/2023 1119   CHOLHDL 5 06/13/2023 1216   VLDL 30.8 06/13/2023 1216   LDLCALC 103 (H) 11/28/2023 1119   LDLCALC 107 (H) 12/06/2020 1415   Hepatic Function Panel     Component Value Date/Time   PROT 5.5 (L) 11/28/2023 1119   ALBUMIN 3.5 (L) 11/28/2023 1119   AST 23 11/28/2023 1119   ALT 19 11/28/2023 1119   ALKPHOS 57 11/28/2023 1119   BILITOT 0.3 11/28/2023 1119   BILIDIR 0.1 01/03/2011 0844      Component Value Date/Time   TSH 3.790 11/28/2023 1119   Nutritional Lab Results  Component Value Date   VD25OH 42.4 11/28/2023   VD25OH 32.73 09/08/2022   VD25OH 58 12/06/2020    Medications: Outpatient Encounter Medications as of 06/04/2024  Medication Sig   acetaminophen  (TYLENOL ) 500 MG tablet Take 650 mg by mouth every 6 (six) hours as needed.   ALPRAZolam  (XANAX ) 0.25 MG tablet Take 1 tablet by mouth twice daily as needed for anxiety   Ascorbic Acid (VITAMIN C ) 500 MG CAPS Take 1 tab daily   baclofen  (LIORESAL ) 10 MG tablet TAKE 1/2 TO 1 (ONE-HALF TO ONE) TABLET BY MOUTH TWICE DAILY AS NEEDED FOR MUSCLE SPASM   cetirizine  (ZYRTEC  ALLERGY) 10 MG tablet Take 1 tablet (10 mg total) by mouth daily.   ibuprofen  (ADVIL ) 800 MG tablet TAKE 1 TABLET BY MOUTH EVERY 8 HOURS AS NEEDED   SYNTHROID  200 MCG tablet TAKE 1 TABLET BY MOUTH ONCE DAILY BEFORE BREAKFAST   tirzepatide  (ZEPBOUND ) 5 MG/0.5ML Pen Inject 5 mg into the skin once a week.   triamterene -hydrochlorothiazide  (MAXZIDE -25) 37.5-25 MG tablet Take 1 tablet by mouth once daily for blood pressure   vitamin B-12 (CYANOCOBALAMIN ) 1000 MCG tablet Take 1,000 mcg by mouth daily.   Zinc  100 MG TABS Take 1 tab daily   No facility-administered encounter medications on file as of 06/04/2024.     Follow-Up   No  follow-ups on file.SABRA Kathleen Mcfarland was informed of the importance of frequent follow up visits to maximize her success with intensive lifestyle modifications for her multiple health conditions.  Attestation Statement   Reviewed by clinician on day of visit: allergies, medications, problem list, medical history, surgical history, family history, social history, and previous encounter notes.     Lucas Parker, MD  "

## 2024-06-04 NOTE — Assessment & Plan Note (Signed)
 She has been on a 1300 calorie nutrition plan and exercising twice a week for cardio strengthening. She has been on Tirzepatide  (Zepbound ) 5 mg since September, which has helped with appetite control and a sense of fullness. Recent illness with a cold and allergies affected her physical activity and dietary habits, leading to a temporary maintenance of weight rather than a plateau. She is not experiencing a plateau as defined by several months of no weight loss. She is considering returning to water aerobics, which previously aided in weight loss. She is aware of the importance of exercise in maintaining weight loss and is committed to incorporating it into her lifestyle. - Continue Tirzepatide  (Zepbound ) 5 mg subcutaneous once a week. - Encouraged continuation of 1300 calorie nutrition plan. - Encouraged resumption of water aerobics and maintaining regular exercise routine. - Advised on maintaining consistent meal frequency and dietary habits.

## 2024-06-04 NOTE — Assessment & Plan Note (Signed)
 Lab Results  Component Value Date   HGBA1C 6.2 (H) 11/28/2023   HGBA1C 6.3 06/13/2023   HGBA1C 6.3 09/08/2022   Stable at this time. Glycemic control is being addressed through the weight management plan above, with expected improvement in insulin  resistance and metabolic parameters as weight loss progresses. Will continue to monitor A1c and glucose trends. Continue monjauro current dose, no increase, due to history of restrictive eating.

## 2024-06-04 NOTE — Assessment & Plan Note (Signed)
 Stable on current therapy. Weight reduction is anticipated to improve OSA severity. Continue current management; reassess symptoms as weight loss progresses.

## 2024-06-25 ENCOUNTER — Ambulatory Visit: Admitting: Obstetrics & Gynecology

## 2024-06-27 ENCOUNTER — Encounter: Admitting: Nurse Practitioner

## 2024-07-02 ENCOUNTER — Ambulatory Visit (INDEPENDENT_AMBULATORY_CARE_PROVIDER_SITE_OTHER): Admitting: Internal Medicine

## 2024-07-07 ENCOUNTER — Ambulatory Visit: Admitting: Obstetrics and Gynecology

## 2024-09-04 ENCOUNTER — Ambulatory Visit: Admitting: Dermatology

## 2024-10-06 ENCOUNTER — Ambulatory Visit

## 2024-12-15 ENCOUNTER — Ambulatory Visit
